# Patient Record
Sex: Female | Born: 1955
Health system: Southern US, Community
[De-identification: ages and names within clinical notes are randomized; demographics above are authoritative.]

## PROBLEM LIST (undated history)

## (undated) DIAGNOSIS — E7800 Pure hypercholesterolemia, unspecified: Secondary | ICD-10-CM

## (undated) DIAGNOSIS — R7303 Prediabetes: Secondary | ICD-10-CM

## (undated) DIAGNOSIS — N200 Calculus of kidney: Secondary | ICD-10-CM

## (undated) DIAGNOSIS — I10 Essential (primary) hypertension: Secondary | ICD-10-CM

## (undated) DIAGNOSIS — M1990 Unspecified osteoarthritis, unspecified site: Secondary | ICD-10-CM

## (undated) DIAGNOSIS — E785 Hyperlipidemia, unspecified: Secondary | ICD-10-CM

## (undated) DIAGNOSIS — K219 Gastro-esophageal reflux disease without esophagitis: Secondary | ICD-10-CM

## (undated) DIAGNOSIS — K5900 Constipation, unspecified: Secondary | ICD-10-CM

## (undated) DIAGNOSIS — M2550 Pain in unspecified joint: Secondary | ICD-10-CM

## (undated) DIAGNOSIS — M069 Rheumatoid arthritis, unspecified: Secondary | ICD-10-CM

## (undated) DIAGNOSIS — E559 Vitamin D deficiency, unspecified: Secondary | ICD-10-CM

## (undated) DIAGNOSIS — R1310 Dysphagia, unspecified: Secondary | ICD-10-CM

## (undated) DIAGNOSIS — L509 Urticaria, unspecified: Secondary | ICD-10-CM

## (undated) HISTORY — DX: Rheumatoid arthritis, unspecified: M06.9

## (undated) HISTORY — DX: Pain in unspecified joint: M25.50

## (undated) HISTORY — DX: Pure hypercholesterolemia, unspecified: E78.00

## (undated) HISTORY — DX: Hyperlipidemia, unspecified: E78.5

## (undated) HISTORY — DX: Unspecified osteoarthritis, unspecified site: M19.90

## (undated) HISTORY — DX: Dysphagia, unspecified: R13.10

## (undated) HISTORY — DX: Constipation, unspecified: K59.00

## (undated) HISTORY — PX: CHOLECYSTECTOMY: SHX55

## (undated) HISTORY — DX: Prediabetes: R73.03

## (undated) HISTORY — PX: ABDOMINAL HYSTERECTOMY: SHX81

## (undated) HISTORY — DX: Calculus of kidney: N20.0

## (undated) HISTORY — PX: LAPAROSCOPIC GASTRIC BANDING: SHX1100

## (undated) HISTORY — DX: Essential (primary) hypertension: I10

## (undated) HISTORY — DX: Urticaria, unspecified: L50.9

## (undated) HISTORY — PX: LAPAROSCOPIC REPAIR AND REMOVAL OF GASTRIC BAND: SHX5919

## (undated) HISTORY — DX: Vitamin D deficiency, unspecified: E55.9

---

## 2011-07-31 DIAGNOSIS — R1310 Dysphagia, unspecified: Secondary | ICD-10-CM

## 2011-07-31 HISTORY — DX: Dysphagia, unspecified: R13.10

## 2015-04-27 ENCOUNTER — Encounter (HOSPITAL_COMMUNITY): Payer: Self-pay | Admitting: Vascular Surgery

## 2015-04-27 ENCOUNTER — Emergency Department (HOSPITAL_COMMUNITY): Payer: Self-pay

## 2015-04-27 ENCOUNTER — Emergency Department (HOSPITAL_COMMUNITY)
Admission: EM | Admit: 2015-04-27 | Discharge: 2015-04-27 | Disposition: A | Discharge status: Home / Self Care | Payer: Self-pay | Attending: Emergency Medicine | Admitting: Emergency Medicine

## 2015-04-27 DIAGNOSIS — Z87891 Personal history of nicotine dependence: Secondary | ICD-10-CM | POA: Insufficient documentation

## 2015-04-27 DIAGNOSIS — R0789 Other chest pain: Secondary | ICD-10-CM

## 2015-04-27 DIAGNOSIS — I10 Essential (primary) hypertension: Secondary | ICD-10-CM | POA: Insufficient documentation

## 2015-04-27 DIAGNOSIS — E663 Overweight: Secondary | ICD-10-CM | POA: Insufficient documentation

## 2015-04-27 DIAGNOSIS — IMO0001 Reserved for inherently not codable concepts without codable children: Secondary | ICD-10-CM

## 2015-04-27 DIAGNOSIS — Z7982 Long term (current) use of aspirin: Secondary | ICD-10-CM | POA: Insufficient documentation

## 2015-04-27 DIAGNOSIS — R0602 Shortness of breath: Secondary | ICD-10-CM | POA: Insufficient documentation

## 2015-04-27 DIAGNOSIS — R2243 Localized swelling, mass and lump, lower limb, bilateral: Secondary | ICD-10-CM | POA: Insufficient documentation

## 2015-04-27 DIAGNOSIS — K219 Gastro-esophageal reflux disease without esophagitis: Secondary | ICD-10-CM | POA: Insufficient documentation

## 2015-04-27 DIAGNOSIS — F419 Anxiety disorder, unspecified: Secondary | ICD-10-CM | POA: Insufficient documentation

## 2015-04-27 HISTORY — DX: Essential (primary) hypertension: I10

## 2015-04-27 HISTORY — DX: Gastro-esophageal reflux disease without esophagitis: K21.9

## 2015-04-27 LAB — HEPATIC FUNCTION PANEL
ALT: 12 U/L — ABNORMAL LOW (ref 14–54)
AST: 19 U/L (ref 15–41)
Albumin: 4 g/dL (ref 3.5–5.0)
Alkaline Phosphatase: 113 U/L (ref 38–126)
Bilirubin, Direct: <0.1 mg/dL — ABNORMAL LOW (ref 0.1–0.5)
Total Bilirubin: 0.7 mg/dL (ref 0.3–1.2)
Total Protein: 7.4 g/dL (ref 6.5–8.1)

## 2015-04-27 LAB — URINE MICROSCOPIC-ADD ON

## 2015-04-27 LAB — URINALYSIS, ROUTINE W REFLEX MICROSCOPIC
Bilirubin Urine: NEGATIVE
Glucose, UA: NEGATIVE mg/dL
Ketones, ur: NEGATIVE mg/dL
Leukocytes, UA: NEGATIVE
Nitrite: NEGATIVE
Protein, ur: NEGATIVE mg/dL
Specific Gravity, Urine: 1.023 (ref 1.005–1.030)
Urobilinogen, UA: 1 mg/dL (ref 0.0–1.0)
pH: 6 (ref 5.0–8.0)

## 2015-04-27 LAB — CBC
HCT: 38.8 % (ref 36.0–46.0)
Hemoglobin: 12.5 g/dL (ref 12.0–15.0)
MCH: 23.5 pg — ABNORMAL LOW (ref 26.0–34.0)
MCHC: 32.2 g/dL (ref 30.0–36.0)
MCV: 72.9 fL — ABNORMAL LOW (ref 78.0–100.0)
Platelets: 305 10*3/uL (ref 150–400)
RBC: 5.32 MIL/uL — ABNORMAL HIGH (ref 3.87–5.11)
RDW: 15.3 % (ref 11.5–15.5)
WBC: 6.6 10*3/uL (ref 4.0–10.5)

## 2015-04-27 LAB — BRAIN NATRIURETIC PEPTIDE: B Natriuretic Peptide: 21.7 pg/mL (ref 0.0–100.0)

## 2015-04-27 LAB — BASIC METABOLIC PANEL
Anion gap: 9 (ref 5–15)
BUN: 12 mg/dL (ref 6–20)
CO2: 27 mmol/L (ref 22–32)
Calcium: 9.1 mg/dL (ref 8.9–10.3)
Chloride: 106 mmol/L (ref 101–111)
Creatinine, Ser: 0.99 mg/dL (ref 0.44–1.00)
GFR calc Af Amer: >60 mL/min (ref >60)
GFR calc non Af Amer: >60 mL/min (ref >60)
Glucose, Bld: 149 mg/dL — ABNORMAL HIGH (ref 65–99)
Potassium: 3.2 mmol/L — ABNORMAL LOW (ref 3.5–5.1)
Sodium: 142 mmol/L (ref 135–145)

## 2015-04-27 LAB — I-STAT TROPONIN, ED
Troponin i, poc: 0.01 ng/mL (ref 0.00–0.08)
Troponin i, poc: 0 ng/mL (ref 0.00–0.08)

## 2015-04-27 LAB — LIPASE, BLOOD: Lipase: 20 U/L — ABNORMAL LOW (ref 22–51)

## 2015-04-27 MED ORDER — FAMOTIDINE 20 MG PO TABS: 20.0000 mg | ORAL_TABLET | Freq: Two times a day (BID) | ORAL | Status: DC | PRN

## 2015-04-27 MED ORDER — FUROSEMIDE 10 MG/ML IJ SOLN
40.0000 mg | Freq: Once | INTRAMUSCULAR | Status: AC
  Administered 2015-04-27: 40 mg via INTRAVENOUS
  Filled 2015-04-27: qty 4

## 2015-04-27 MED ORDER — FAMOTIDINE 20 MG PO TABS
20.0000 mg | ORAL_TABLET | Freq: Once | ORAL | Status: AC
  Administered 2015-04-27: 20 mg via ORAL
  Filled 2015-04-27: qty 1

## 2015-04-27 MED ORDER — ONDANSETRON HCL 4 MG/2ML IJ SOLN
4.0000 mg | Freq: Once | INTRAMUSCULAR | Status: AC
  Administered 2015-04-27: 4 mg via INTRAVENOUS
  Filled 2015-04-27: qty 2

## 2015-04-27 MED ORDER — POTASSIUM CHLORIDE CRYS ER 20 MEQ PO TBCR
40.0000 meq | EXTENDED_RELEASE_TABLET | Freq: Once | ORAL | Status: AC
  Administered 2015-04-27: 40 meq via ORAL
  Filled 2015-04-27: qty 2

## 2015-04-27 MED ORDER — ESOMEPRAZOLE MAGNESIUM 40 MG PO CPDR: 40.0000 mg | DELAYED_RELEASE_CAPSULE | Freq: Every day | ORAL | Status: DC

## 2015-04-27 MED ORDER — HYDROCHLOROTHIAZIDE 12.5 MG PO CAPS
12.5000 mg | ORAL_CAPSULE | Freq: Every day | ORAL | Status: DC
  Administered 2015-04-27: 12.5 mg via ORAL
  Filled 2015-04-27: qty 1

## 2015-04-27 MED ORDER — HYDROCHLOROTHIAZIDE 12.5 MG PO TABS: 12.5000 mg | ORAL_TABLET | Freq: Every day | ORAL | Status: DC

## 2015-04-27 MED ORDER — GI COCKTAIL ~~LOC~~
30.0000 mL | Freq: Once | ORAL | Status: AC
  Administered 2015-04-27: 30 mL via ORAL
  Filled 2015-04-27: qty 30

## 2015-04-27 NOTE — ED Provider Notes (Signed)
CSN: [id number]     Arrival date & time 04/27/15  1405 History   First MD Initiated Contact with Patient 04/27/15 1631     Chief Complaint  Patient presents with  . Chest Pain     (Consider location/radiation/quality/duration/timing/severity/associated sxs/prior Treatment) The history is provided by the patient.  Jennifer Davila is a 59 y.o. female hx of HTN, GERD, lap band with subsequent removal, who presented with chest pain. Chest pain associated with some nausea since yesterday. Worse when she eats some food but denies any food stuck in her throat. Pain does not radiate. Has some nausea but no vomiting. Also feels lightheaded and dizzy. Denies any pleuritic pain. Has minimal shortness of breath laying down. Of note, patient has been having bilateral leg swelling for several months but has not seen her doctor about it. She also has history of reflux but has not been taking her medicines. She has history of hypertension but was on Norvasc previously but has not been taking it either.    Past Medical History  Diagnosis Date  . Hypertension   . GERD (gastroesophageal reflux disease)    Past Surgical History  Procedure Laterality Date  . Laparoscopic gastric banding    . Laparoscopic repair and removal of gastric band    . Abdominal hysterectomy    . Cholecystectomy     No family history on file. Social History  Substance Use Topics  . Smoking status: Former Smoker    Types: Cigarettes  . Smokeless tobacco: Never Used  . Alcohol Use: No   OB History    No data available     Review of Systems  Cardiovascular: Positive for chest pain and leg swelling.  All other systems reviewed and are negative.     Allergies  Review of patient's allergies indicates no known allergies.  Home Medications   Prior to Admission medications   Medication Sig Start Date End Date Taking? Authorizing Provider  aspirin 81 MG tablet Take 81 mg by mouth daily.   Yes Historical Provider, MD   ibuprofen (ADVIL,MOTRIN) 800 MG tablet Take 800 mg by mouth every 8 (eight) hours as needed for moderate pain.   Yes Historical Provider, MD   BP 141/72 mmHg  Pulse 69  Temp(Src) 98.7 F (37.1 C) (Oral)  Resp 16  Ht 5\' 2"  (1.575 m)  Wt 245 lb (111.131 kg)  BMI 44.80 kg/m2  SpO2 99% Physical Exam  Constitutional: She is oriented to person, place, and time.  Overweight, slightly anxious   HENT:  Head: Normocephalic.  Mouth/Throat: Oropharynx is clear and moist.  Eyes: Conjunctivae are normal. Pupils are equal, round, and reactive to light.  Neck: Normal range of motion. Neck supple.  Cardiovascular: Normal rate, regular rhythm and normal heart sounds.   Pulmonary/Chest: Effort normal.  ? Crackles bilateral bases   Abdominal: Soft. Bowel sounds are normal. She exhibits no distension. There is no tenderness. There is no rebound.  Musculoskeletal:  2+ edema bilaterally, no calf tenderness   Neurological: She is alert and oriented to person, place, and time. No cranial nerve deficit. Coordination normal.  Skin: Skin is warm and dry.  Psychiatric: She has a normal mood and affect. Her behavior is normal. Judgment and thought content normal.  Nursing note and vitals reviewed.   ED Course  Procedures (including critical care time) Labs Review Labs Reviewed  BASIC METABOLIC PANEL - Abnormal; Notable for the following:    Potassium 3.2 (*)    Glucose, Bld 149 (*)  All other components within normal limits  CBC - Abnormal; Notable for the following:    RBC 5.32 (*)    MCV 72.9 (*)    MCH 23.5 (*)    All other components within normal limits  HEPATIC FUNCTION PANEL - Abnormal; Notable for the following:    ALT 12 (*)    Bilirubin, Direct <0.1 (*)    All other components within normal limits  URINALYSIS, ROUTINE W REFLEX MICROSCOPIC (NOT AT [facility]) - Abnormal; Notable for the following:    APPearance CLOUDY (*)    Hgb urine dipstick TRACE (*)    All other components within  normal limits  LIPASE, BLOOD - Abnormal; Notable for the following:    Lipase 20 (*)    All other components within normal limits  URINE MICROSCOPIC-ADD ON - Abnormal; Notable for the following:    Squamous Epithelial / LPF MANY (*)    Crystals CA OXALATE CRYSTALS (*)    All other components within normal limits  BRAIN NATRIURETIC PEPTIDE  I-STAT TROPOININ, ED  Jennifer Davila, ED    Imaging Review Dg Chest 2 View  04/27/2015   CLINICAL DATA:  Chest pain beginning last night. Intermittent dizziness for a month.  EXAM: CHEST  2 VIEW  COMPARISON:  None.  FINDINGS: The lungs are clear. Heart size is normal. No pneumothorax or pleural effusion. Mild scoliosis noted. Cholecystectomy clips are seen.  IMPRESSION: No acute disease.   Electronically Signed   By: Inge Rise M.D.   On: 04/27/2015 16:09   I have personally reviewed and evaluated these images and lab results as part of my medical decision-making.   EKG Interpretation   Date/Time:  Wednesday April 27 2015 14:15:55 EDT Ventricular Rate:  94 PR Interval:  160 QRS Duration: 78 QT Interval:  366 QTC Calculation: 457 R Axis:   -16 Text Interpretation:  Normal sinus rhythm Normal ECG No previous ECGs  available Confirmed by Clorene Nerio  MD, Jennifer Davila ([id number]) on 04/27/2015 4:31:27 PM      MDM   Final diagnoses:  None    Jennifer Davila is a 59 y.o. female here with substernal chest pain. Likely reflux vs GERD, less likely ACS. Given onset less than 24 hrs, will get delta trop. Also consider CHF given leg swelling so will get BNP. I doubt dissection or PE. Will get labs, BNP, CXR, delta trop. Will reassess.   8:28 PM Delta trop neg. CXR clear. BNP nl. I think likely combination of reflux, hypertension. Will give HCTZ as it will help with hypertension, leg swelling. Will place patient back on nexium, prn pepcid. Pain resolved now.    Wandra Arthurs, MD 04/27/15 2030

## 2015-04-27 NOTE — Discharge Instructions (Signed)
Take hctz daily for elevated blood pressure and leg swelling.  Take nexium daily.  Take pepcid twice daily as needed for reflux.   See your GI doctor and regular doctor.   Recheck blood pressure in a week with your doctor.   Return to ER if you have worse chest pain, leg swelling, shortness of breath.

## 2015-04-27 NOTE — ED Notes (Signed)
Pt reports to the ED for eval of sharp midsternal CP that started last pm. Reports associated symptoms of lightheadedness and nausea. Denies any active vomiting or SOB.\ She reports it is worse with swallowing. She also reports bilateral leg pain and swelling x several months. Describes the leg pain as electrical feelings. Pt A&Ox4, resp e/u, and skin warm and dry.

## 2015-05-23 ENCOUNTER — Encounter: Payer: Self-pay | Admitting: Family Medicine

## 2015-05-23 ENCOUNTER — Ambulatory Visit (INDEPENDENT_AMBULATORY_CARE_PROVIDER_SITE_OTHER): Payer: Self-pay | Admitting: Family Medicine

## 2015-05-23 VITALS — BP 149/77 | HR 89 | Temp 97.9°F | Resp 16 | Ht 62.0 in | Wt 241.0 lb

## 2015-05-23 DIAGNOSIS — Z23 Encounter for immunization: Secondary | ICD-10-CM

## 2015-05-23 DIAGNOSIS — Z0000 Encounter for general adult medical examination without abnormal findings: Secondary | ICD-10-CM

## 2015-05-23 DIAGNOSIS — M25552 Pain in left hip: Secondary | ICD-10-CM

## 2015-05-23 MED ORDER — FAMOTIDINE 20 MG PO TABS: 20.0000 mg | ORAL_TABLET | Freq: Two times a day (BID) | ORAL | Status: DC | PRN

## 2015-05-23 MED ORDER — NAPROXEN 500 MG PO TABS: 500.0000 mg | ORAL_TABLET | Freq: Two times a day (BID) | ORAL | Status: DC

## 2015-05-23 MED ORDER — HYDROCHLOROTHIAZIDE 12.5 MG PO TABS: 12.5000 mg | ORAL_TABLET | Freq: Every day | ORAL | Status: DC

## 2015-05-23 MED ORDER — ESOMEPRAZOLE MAGNESIUM 40 MG PO CPDR: 40.0000 mg | DELAYED_RELEASE_CAPSULE | Freq: Every day | ORAL | Status: DC

## 2015-05-23 NOTE — Patient Instructions (Signed)
Call [phone] and ask for [facility] Financial Service and make an appointment to discuss your hospital bill and hopefully get a [facility] Discount Card I am sending your medicines to [facility] at [address] Discuss financial assistance for your medicines with them. Be sure to eat with your naproxen for your leg pain and be sure to take your medications for GERD regularly When you get the [facility] Discount Card or [employer], call and make an appointment to come back for a PAP smear (important). We will also need to schedule you a mammogram and give you stool cards at that time.

## 2015-05-25 DIAGNOSIS — M25559 Pain in unspecified hip: Secondary | ICD-10-CM | POA: Insufficient documentation

## 2015-05-25 NOTE — Progress Notes (Signed)
Patient ID: Jennifer Davila, female   DOB: 11/26/1955, 59 y.o.   MRN: [id number]   Jennifer Davila, is a 59 y.o. female  [id number]  [id number]  DOB - [birth year]  CC:  Chief Complaint  Patient presents with  . Establish Care  . Leg Pain    SWELLING        HPI: Jennifer Davila is a 59 y.o. female here to establish care. She was seen at urgent care on 9/28 with chest pain which was diagnosed as non-cardiac. At that time she was found to be hypertensive and started on Hctz 25 mg. For HTN and for Nexium and famodine for GERD. Her only other medication in ibuprofen for left hip and leg pain. This is her major complaint at this time.  She was told to follow-up her for ongoing treatment of hypertension and GERD. Her BP today is 149/77. She reports very little heartburn symptoms since starting on medication. She did have labs on 9/28 and those have been reviewed. She is in need of some health maintenance updates, including Tdap, influenza, pneumonia, colon cancer screening, mammogram and PAP. She reports she does not smoking cigarettes, use alcohol or use illicit drugs.   No Known Allergies Past Medical History  Diagnosis Date  . Hypertension   . GERD (gastroesophageal reflux disease)    Current Outpatient Prescriptions on File Prior to Visit  Medication Sig Dispense Refill  . aspirin 81 MG tablet Take 81 mg by mouth daily.    Marland Kitchen ibuprofen (ADVIL,MOTRIN) 800 MG tablet Take 800 mg by mouth every 8 (eight) hours as needed for moderate pain.     No current facility-administered medications on file prior to visit.   Family History  Problem Relation Age of Onset  . Diabetes Father    Social History   Social History  . Marital Status: Married    Spouse Name: N/A  . Number of Children: N/A  . Years of Education: N/A   Occupational History  . Not on file.   Social History Main Topics  . Smoking status: Former Smoker    Types: Cigarettes  . Smokeless tobacco: Never Used  .  Alcohol Use: No  . Drug Use: No  . Sexual Activity: Not on file   Other Topics Concern  . Not on file   Social History Narrative    Review of Systems: Constitutional: Negative for fever, chills, appetite change, weight loss,  Fatigue. Skin: Negative for rashes or lesions of concern. HENT: Negative for ear discharge.nose bleeds. She reports occassional pressure in ears. Eyes: Negative for pain, discharge, redness, itching. Wears glasses for vision Neck: Negative for pain, stiffness Respiratory: Negative for cough, shortness of breath,   Cardiovascular: Negative for chest pain, palpitations and leg swelling. Gastrointestinal: Negative for abdominal pain, nausea, vomiting, diarrhea, constipations. Positive for heartburn, currently controlled.  Genitourinary: Negative for dysuria, urgency, frequency, hematuria,  Musculoskeletal: Positive  For low back pain and left hip and leg pain Negative joint  swelling, and gait problem.Negative for weakness. Neurological: Negative fortremors, seizures, syncope, numbness. Positive for occassional  Headaches and occassional dizziness.   Hematological: Negative for easy bruising or bleeding Psychiatric/Behavioral: Negative for depression, anxiety, decreased concentration, confusion   Objective:   Filed Vitals:   05/23/15 1407  BP: 149/77  Pulse: 89  Temp: 97.9 F (36.6 C)  Resp: 16    Physical Exam: Constitutional: Patient appears well-developed and well-nourished. No distress. HENT: Normocephalic, atraumatic, External right and left ear normal. Oropharynx is clear and moist.  Eyes: Conjunctivae and EOM are normal. PERRLA, no scleral icterus. Neck: Normal ROM. Neck supple. No lymphadenopathy, No thyromegaly. CVS: RRR, S1/S2 +, no murmurs, no gallops, no rubs Pulmonary: Effort and breath sounds normal, no stridor, rhonchi, wheezes, rales.  Abdominal: Soft. Normoactive BS,, no distension, tenderness, rebound or guarding.  Musculoskeletal:  Normal range of motion. No edema. There is mild tenderness with pressure over the left hip joint. Neuro: Alert.Normal muscle tone coordination. Non-focal Skin: Skin is warm and dry. No rash noted. Not diaphoretic. No erythema. No pallor. Psychiatric: Normal mood and affect. Behavior, judgment, thought content normal.  Lab Results  Component Value Date   WBC 6.6 04/27/2015   HGB 12.5 04/27/2015   HCT 38.8 04/27/2015   MCV 72.9* 04/27/2015   PLT 305 04/27/2015   Lab Results  Component Value Date   CREATININE 0.99 04/27/2015   BUN 12 04/27/2015   NA 142 04/27/2015   K 3.2* 04/27/2015   CL 106 04/27/2015   CO2 27 04/27/2015    No results found for: HGBA1C Lipid Panel  No results found for: CHOL, TRIG, HDL, CHOLHDL, VLDL, LDLCALC     Assessment and plan:   1. Health care maintenance  - Flu Vaccine QUAD 36+ mos PF IM (Fluarix & Fluzone Quad PF) -Tdap greater than 7.  2. Visit to establish care -I have reviewed information provided by the patient and labs drawn recently. She currently has no coverage and request we wait until she arranges for [facility] Discount Card and /or orange card to do any further preventive care measures. I have suggested she contact [facility] Financial services with her recent hospital bill.She will follow-up when that is done to make arrangements for colon cancer screen, mammogram, PAP smear and other screening labs needed.    Return in about 2 months (around 07/23/2015).  The patient was given clear instructions to go to ER or return to medical center if symptoms don't improve, worsen or new problems develop. The patient verbalized understanding.    Micheline Chapman FNP  05/25/2015, 12:02 PM

## 2015-08-04 ENCOUNTER — Ambulatory Visit (INDEPENDENT_AMBULATORY_CARE_PROVIDER_SITE_OTHER): Payer: Self-pay | Admitting: Family Medicine

## 2015-08-04 ENCOUNTER — Encounter: Payer: Self-pay | Admitting: Family Medicine

## 2015-08-04 VITALS — BP 187/98 | HR 77 | Temp 98.7°F | Resp 16

## 2015-08-04 DIAGNOSIS — Z23 Encounter for immunization: Secondary | ICD-10-CM

## 2015-08-04 DIAGNOSIS — I10 Essential (primary) hypertension: Secondary | ICD-10-CM

## 2015-08-04 LAB — CBC WITH DIFFERENTIAL/PLATELET
Basophils Absolute: 0.1 10*3/uL (ref 0.0–0.1)
Basophils Relative: 1 % (ref 0–1)
Eosinophils Absolute: 0.3 10*3/uL (ref 0.0–0.7)
Eosinophils Relative: 4 % (ref 0–5)
HCT: 38.4 % (ref 36.0–46.0)
Hemoglobin: 12.3 g/dL (ref 12.0–15.0)
Lymphocytes Relative: 32 % (ref 12–46)
Lymphs Abs: 2 10*3/uL (ref 0.7–4.0)
MCH: 22.9 pg — ABNORMAL LOW (ref 26.0–34.0)
MCHC: 32 g/dL (ref 30.0–36.0)
MCV: 71.6 fL — ABNORMAL LOW (ref 78.0–100.0)
MPV: 10 fL (ref 8.6–12.4)
Monocytes Absolute: 0.3 10*3/uL (ref 0.1–1.0)
Monocytes Relative: 5 % (ref 3–12)
Neutro Abs: 3.7 10*3/uL (ref 1.7–7.7)
Neutrophils Relative %: 58 % (ref 43–77)
Platelets: 304 10*3/uL (ref 150–400)
RBC: 5.36 MIL/uL — ABNORMAL HIGH (ref 3.87–5.11)
RDW: 16 % — ABNORMAL HIGH (ref 11.5–15.5)
WBC: 6.3 10*3/uL (ref 4.0–10.5)

## 2015-08-04 LAB — TSH: TSH: 5.709 u[IU]/mL — ABNORMAL HIGH (ref 0.350–4.500)

## 2015-08-04 MED ORDER — LISINOPRIL-HYDROCHLOROTHIAZIDE 20-25 MG PO TABS: 1.0000 | ORAL_TABLET | Freq: Every day | ORAL | Status: DC

## 2015-08-04 MED FILL — LISINOPRIL-HCTZ 20-25 MG TA: 20-25 | 30 days supply | Qty: 30 | Fill #0

## 2015-08-04 NOTE — Patient Instructions (Addendum)
Make an appointment for 2 weeks to have BP checked and to have a PAP smear Stop hydrochol\thizide and start lisinopril/hctz 20/25. Daily Careful of salt in diet.

## 2015-08-04 NOTE — Progress Notes (Signed)
Patient ID: Jennifer Davila, female   DOB: [birth year], 60 y.o.   MRN: [id number]   Jennifer Davila, is a 60 y.o. female  [id number]  [id number]  DOB - 08/03/1955  CC:  Chief Complaint  Patient presents with  . Follow-up    2 month       HPI: Jennifer Davila is a 60 y.o. female here for follow-up hypertension. And GERD. She is currently on Hydrodiuril 12.5 mg for hypertension and Nexium 40 mg for GERD.She reports not being consistent with recommended diet and exercise.She also complains of chronic lower back and knee pain and takes naproxen prn.   No Known Allergies Past Medical History  Diagnosis Date  . Hypertension   . GERD (gastroesophageal reflux disease)    Current Outpatient Prescriptions on File Prior to Visit  Medication Sig Dispense Refill  . aspirin 81 MG tablet Take 81 mg by mouth daily.    Marland Kitchen esomeprazole (NEXIUM) 40 MG capsule Take 1 capsule (40 mg total) by mouth daily. 30 capsule 0  . ibuprofen (ADVIL,MOTRIN) 800 MG tablet Take 800 mg by mouth every 8 (eight) hours as needed for moderate pain.    . naproxen (NAPROSYN) 500 MG tablet Take 1 tablet (500 mg total) by mouth 2 (two) times daily with a meal. 30 tablet 0  . famotidine (PEPCID) 20 MG tablet Take 1 tablet (20 mg total) by mouth 2 (two) times daily as needed for heartburn or indigestion. (Patient not taking: Reported on 08/04/2015) 90 tablet 3   No current facility-administered medications on file prior to visit.   Family History  Problem Relation Age of Onset  . Diabetes Father    Social History   Social History  . Marital Status: Married    Spouse Name: N/A  . Number of Children: N/A  . Years of Education: N/A   Occupational History  . Not on file.   Social History Main Topics  . Smoking status: Former Smoker    Types: Cigarettes  . Smokeless tobacco: Never Used  . Alcohol Use: No  . Drug Use: No  . Sexual Activity: Not on file   Other Topics Concern  . Not on file   Social  History Narrative    Review of Systems: Constitutional: Negative for fever, chills, appetite change, weight loss,  Fatigue. Skin: Negative for rashes or lesions of concern. Reports dry scalp HENT: Negative for ear pain, ear discharge.nose bleeds Eyes: Negative for pain, discharge, redness, itching and visual disturbance. Neck: Negative for pain, stiffness Respiratory: Negative for cough, shortness of breath,   Cardiovascular: Negative for chest pain, palpitations. Some pedal edema with standing for prolonged periods.  Gastrointestinal: Negative for abdominal pain, nausea, vomiting, diarrhea, constipations Genitourinary: Negative for dysuria, urgency, frequency, hematuria,  Musculoskeletal: Positive for back pain and knew pain.joint  swelling, and gait problem.Negative for weakness. Neurological: Negative for  tremors, seizures, syncope,   light-headedness, numbness. Positive for occassional dizziness and headaches.  Hematological: Negative for easy bruising or bleeding Psychiatric/Behavioral: Negative for depression, anxiety, decreased concentration, confusion   Objective:   Filed Vitals:   08/04/15 1133 08/04/15 1159  BP: 176/119 187/98  Pulse: 77   Temp: 98.7 F (37.1 C)   Resp: 16     Physical Exam: Constitutional: Patient appears well-developed and well-nourished. No distress. HENT: Normocephalic, atraumatic, External right and left ear normal. Oropharynx is clear and moist.  Eyes: Conjunctivae and EOM are normal. PERRLA, no scleral icterus. Neck: Normal ROM. Neck supple. No lymphadenopathy, No thyromegaly. CVS:  RRR, S1/S2 +, no murmurs, no gallops, no rubs. There is mild pedal edema. Pulmonary: Effort and breath sounds normal, no stridor, rhonchi, wheezes, rales.  Abdominal: Soft. Normoactive BS,, no distension, tenderness, rebound or guarding.  Musculoskeletal: Normal range of motion. No edema and no tenderness.  Neuro: Alert.Normal muscle tone coordination.  Non-focal Skin: Skin is warm and dry. No rash noted. Not diaphoretic. No erythema. No pallor. Psychiatric: Normal mood and affect. Behavior, judgment, thought content normal.  Lab Results  Component Value Date   WBC 6.6 04/27/2015   HGB 12.5 04/27/2015   HCT 38.8 04/27/2015   MCV 72.9* 04/27/2015   PLT 305 04/27/2015   Lab Results  Component Value Date   CREATININE 0.99 04/27/2015   BUN 12 04/27/2015   NA 142 04/27/2015   K 3.2* 04/27/2015   CL 106 04/27/2015   CO2 27 04/27/2015    No results found for: HGBA1C Lipid Panel  No results found for: CHOL, TRIG, HDL, CHOLHDL, VLDL, LDLCALC     Assessment and plan:   1. Essential hypertension - COMPLETE METABOLIC PANEL WITH GFR - CBC w/Diff - Lipid panel - TSH - discontinue hydrodiuril.   Start  lisinopril-hydrochlorothiazide (PRINZIDE,ZESTORETIC) 20-25 MG tablet; Take 1 tablet by mouth daily.  Dispense: 90 tablet; Refill: 3  2. Need for prophylactic vaccination against Streptococcus pneumoniae (pneumococcus) - Pneumococcal conjugate vaccine 13-valent  Return in about 1 week (around 08/11/2015).  The patient was given clear instructions to go to ER or return to medical center if symptoms don't improve, worsen or new problems develop. The patient verbalized understanding.    Micheline Chapman FNP  08/04/2015, 3:46 PM

## 2015-08-05 LAB — COMPLETE METABOLIC PANEL WITH GFR
ALT: 9 U/L (ref 6–29)
AST: 14 U/L (ref 10–35)
Albumin: 4.3 g/dL (ref 3.6–5.1)
Alkaline Phosphatase: 99 U/L (ref 33–130)
BUN: 15 mg/dL (ref 7–25)
CO2: 28 mmol/L (ref 20–31)
Calcium: 9.4 mg/dL (ref 8.6–10.4)
Chloride: 98 mmol/L (ref 98–110)
Creat: 0.88 mg/dL (ref 0.50–0.99)
GFR, Est African American: 82 mL/min (ref >=60)
GFR, Est Non African American: 71 mL/min (ref >=60)
Glucose, Bld: 92 mg/dL (ref 65–99)
Potassium: 3.1 mmol/L — ABNORMAL LOW (ref 3.5–5.3)
Sodium: 141 mmol/L (ref 135–146)
Total Bilirubin: 0.5 mg/dL (ref 0.2–1.2)
Total Protein: 7.4 g/dL (ref 6.1–8.1)

## 2015-08-05 LAB — LIPID PANEL
Cholesterol: 205 mg/dL — ABNORMAL HIGH (ref 125–200)
HDL: 44 mg/dL — ABNORMAL LOW (ref >=46)
LDL Cholesterol: 138 mg/dL — ABNORMAL HIGH (ref <130)
Total CHOL/HDL Ratio: 4.7 Ratio (ref <=5.0)
Triglycerides: 115 mg/dL (ref <150)
VLDL: 23 mg/dL (ref <30)

## 2015-08-09 ENCOUNTER — Telehealth: Payer: Self-pay

## 2015-08-09 ENCOUNTER — Other Ambulatory Visit (HOSPITAL_COMMUNITY): Payer: Self-pay | Admitting: Family Medicine

## 2015-08-09 MED ORDER — POTASSIUM CHLORIDE ER 10 MEQ PO TBCR: 10.0000 meq | EXTENDED_RELEASE_TABLET | Freq: Every day | ORAL | Status: DC

## 2015-08-09 MED ORDER — PRAVASTATIN SODIUM 20 MG PO TABS
20.0000 mg | ORAL_TABLET | Freq: Every day | ORAL | Status: DC
Start: 2015-08-09 — End: 2016-05-03

## 2015-08-09 MED FILL — POTASSIUM CL 10 MEQ TAB SA: 10 | 30 days supply | Qty: 30 | Fill #0

## 2015-08-09 MED FILL — PRAVASTATIN NA 20 MG TAB: 20 | 30 days supply | Qty: 30 | Fill #0

## 2015-08-09 NOTE — Telephone Encounter (Signed)
-----   Message from Micheline Chapman, NP sent at 08/09/2015  8:28 AM EST ----- Potassium low (3.1) Will send in rx for replacement. Cholesterol elevated, will send in rx.

## 2015-08-09 NOTE — Telephone Encounter (Signed)
Patient returned call and I advised her of results and that rx's had been sent into pharmacy. Thanks!

## 2015-08-09 NOTE — Telephone Encounter (Signed)
Left a message for patient to return call.

## 2015-08-11 ENCOUNTER — Other Ambulatory Visit: Payer: Self-pay

## 2015-08-29 ENCOUNTER — Ambulatory Visit (INDEPENDENT_AMBULATORY_CARE_PROVIDER_SITE_OTHER): Payer: Self-pay | Admitting: Family Medicine

## 2015-08-29 ENCOUNTER — Encounter: Payer: Self-pay | Admitting: Family Medicine

## 2015-08-29 VITALS — BP 184/88 | HR 77 | Temp 99.1°F | Ht 62.0 in | Wt 241.0 lb

## 2015-08-29 DIAGNOSIS — I10 Essential (primary) hypertension: Secondary | ICD-10-CM

## 2015-08-29 MED ORDER — AMLODIPINE BESYLATE 10 MG PO TABS: 10.0000 mg | ORAL_TABLET | Freq: Every day | ORAL | Status: DC

## 2015-08-29 MED ORDER — HYDROCHLOROTHIAZIDE 25 MG PO TABS: 25.0000 mg | ORAL_TABLET | Freq: Every day | ORAL | Status: DC

## 2015-08-29 MED FILL — ?AMLODIPINE BESYLATE 10 MG: 10 | 30 days supply | Qty: 30 | Fill #0

## 2015-08-29 MED FILL — HYDROCHLOROTHIAZIDE 25 MG T: 25 | 30 days supply | Qty: 30 | Fill #0

## 2015-08-29 NOTE — Patient Instructions (Signed)
Stop lisinopril/hctz Start amlodipine 10 mg daily & Hctz 25 mg daily.

## 2015-08-31 NOTE — Progress Notes (Signed)
Patient ID: Jennifer Davila, female   DOB: [birth year], 60 y.o.   MRN: [id number]   Jennifer Davila, is a 60 y.o. female  [id number]  [id number]  DOB - 1956/05/23  CC:  Chief Complaint  Patient presents with  . pap smear    would like to go over lab work as well, complains of cough since starting meds       HPI: Jennifer Davila is a 60 y.o. female who presents for follow-up hypertension. She reports she has had a dry cough since starting her lisinopril.Her blood pressure is not in adequate control. She reports no other complaints. She also has a history of GERD.She has had her flu shot and her first pneumonia shot.   No Known Allergies   Past Medical History  Diagnosis Date  . Hypertension   . GERD (gastroesophageal reflux disease)    Current Outpatient Prescriptions on File Prior to Visit  Medication Sig Dispense Refill  . esomeprazole (NEXIUM) 40 MG capsule Take 1 capsule (40 mg total) by mouth daily. 30 capsule 0  . ibuprofen (ADVIL,MOTRIN) 800 MG tablet Take 800 mg by mouth every 8 (eight) hours as needed for moderate pain.    . naproxen (NAPROSYN) 500 MG tablet Take 1 tablet (500 mg total) by mouth 2 (two) times daily with a meal. 30 tablet 0  . potassium chloride (K-DUR) 10 MEQ tablet Take 1 tablet (10 mEq total) by mouth daily. 90 tablet 1  . pravastatin (PRAVACHOL) 20 MG tablet Take 1 tablet (20 mg total) by mouth daily. 90 tablet 1  . aspirin 81 MG tablet Take 81 mg by mouth daily.    . famotidine (PEPCID) 20 MG tablet Take 1 tablet (20 mg total) by mouth 2 (two) times daily as needed for heartburn or indigestion. (Patient not taking: Reported on 08/04/2015) 90 tablet 3   No current facility-administered medications on file prior to visit.   Family History  Problem Relation Age of Onset  . Diabetes Father    Social History   Social History  . Marital Status: Married    Spouse Name: N/A  . Number of Children: N/A  . Years of Education: N/A   Occupational  History  . Not on file.   Social History Main Topics  . Smoking status: Former Smoker    Types: Cigarettes  . Smokeless tobacco: Never Used  . Alcohol Use: No  . Drug Use: No  . Sexual Activity: Not on file   Other Topics Concern  . Not on file   Social History Narrative    Review of Systems: Constitutional: Negative for fever, chills, appetite change, weight loss,  Fatigue. Skin: Negative for rashes or lesions of concern. HENT: Negative for ear pain, ear discharge.nose bleeds Eyes: Negative for pain, discharge, redness, itching and visual disturbance. Neck: Negative for pain, stiffness Respiratory: Negative for shortness of breath. Positive for dry cough since starting lisinopril Cardiovascular: Negative for chest pain, palpitations and leg swelling. Gastrointestinal: Negative for abdominal pain, nausea, vomiting, diarrhea, constipations Genitourinary: Negative for dysuria, urgency, frequency, hematuria,  Musculoskeletal: Negative for back pain, joint pain, joint  swelling, and gait problem.Negative for weakness. Neurological: Negative for dizziness, tremors, seizures, syncope,   light-headedness, numbness and headaches.  Hematological: Negative for easy bruising or bleeding Psychiatric/Behavioral: Negative for depression, anxiety, decreased concentration, confusion   Objective:   Filed Vitals:   08/29/15 1355 08/29/15 1414  BP: 189/82 184/88  Pulse: 77   Temp: 99.1 F (37.3 C)  Physical Exam: Constitutional: Patient appears well-developed and well-nourished. No distress. HENT: Normocephalic, atraumatic, External right and left ear normal. Oropharynx is clear and moist.  Eyes: Conjunctivae and EOM are normal. PERRLA, no scleral icterus. Neck: Normal ROM. Neck supple. No lymphadenopathy, No thyromegaly. CVS: RRR, S1/S2 +, no murmurs, no gallops, no rubs Pulmonary: Effort and breath sounds normal, no stridor, rhonchi, wheezes, rales.  Abdominal: Soft. Normoactive  BS,, no distension, tenderness, rebound or guarding.  Musculoskeletal: Normal range of motion. No edema and no tenderness.  Neuro: Alert.Normal muscle tone coordination. Non-focal Skin: Skin is warm and dry. No rash noted. Not diaphoretic. No erythema. No pallor. Psychiatric: Normal mood and affect. Behavior, judgment, thought content normal.  Lab Results  Component Value Date   WBC 6.3 08/04/2015   HGB 12.3 08/04/2015   HCT 38.4 08/04/2015   MCV 71.6* 08/04/2015   PLT 304 08/04/2015   Lab Results  Component Value Date   CREATININE 0.88 08/04/2015   BUN 15 08/04/2015   NA 141 08/04/2015   K 3.1* 08/04/2015   CL 98 08/04/2015   CO2 28 08/04/2015    No results found for: HGBA1C Lipid Panel     Component Value Date/Time   CHOL 205* 08/04/2015 1208   TRIG 115 08/04/2015 1208   HDL 44* 08/04/2015 1208   CHOLHDL 4.7 08/04/2015 1208   VLDL 23 08/04/2015 1208   LDLCALC 138* 08/04/2015 1208       Assessment and plan:   1. Essential hypertension - Stop lisinopril - amLODipine (NORVASC) 10 MG tablet; Take 1 tablet (10 mg total) by mouth daily.  Dispense: 90 tablet; Refill: 3 - hydrochlorothiazide (HYDRODIURIL) 25 MG tablet; Take 1 tablet (25 mg total) by mouth daily.  Dispense: 90 tablet; Refill: 3   Return in about 1 month (around 09/27/2015) for HTN.  The patient was given clear instructions to go to ER or return to medical center if symptoms don't improve, worsen or new problems develop. The patient verbalized understanding.    Jennifer Chapman FNP  08/31/2015, 4:39 PM

## 2015-09-29 ENCOUNTER — Other Ambulatory Visit: Payer: Self-pay | Admitting: Family Medicine

## 2015-09-29 ENCOUNTER — Other Ambulatory Visit: Payer: Self-pay

## 2015-09-29 MED ORDER — LISINOPRIL 10 MG PO TABS: 10.0000 mg | ORAL_TABLET | Freq: Every day | ORAL | Status: DC

## 2015-09-29 NOTE — Progress Notes (Signed)
Pt reports pain in lower back that radiates to feet-bilateral. Per Vaughan Basta, advised pt to make appt.  I also advised patient of low sodium diet, comfort measures for pain, and that lisinopril would be added (per Vaughan Basta) to current medication list-[facility] per [place]. Pt voiced understanding.

## 2015-10-31 ENCOUNTER — Other Ambulatory Visit: Payer: Self-pay | Admitting: Family Medicine

## 2015-10-31 ENCOUNTER — Encounter: Payer: Self-pay | Admitting: Family Medicine

## 2015-10-31 ENCOUNTER — Ambulatory Visit (INDEPENDENT_AMBULATORY_CARE_PROVIDER_SITE_OTHER): Payer: Self-pay | Admitting: Family Medicine

## 2015-10-31 VITALS — BP 142/78 | HR 79 | Temp 98.1°F | Ht 63.0 in | Wt 234.0 lb

## 2015-10-31 DIAGNOSIS — Z114 Encounter for screening for human immunodeficiency virus [HIV]: Secondary | ICD-10-CM

## 2015-10-31 DIAGNOSIS — I10 Essential (primary) hypertension: Secondary | ICD-10-CM

## 2015-10-31 DIAGNOSIS — Z1159 Encounter for screening for other viral diseases: Secondary | ICD-10-CM

## 2015-10-31 DIAGNOSIS — K219 Gastro-esophageal reflux disease without esophagitis: Secondary | ICD-10-CM

## 2015-10-31 LAB — COMPLETE METABOLIC PANEL WITH GFR
ALT: 15 U/L (ref 6–29)
AST: 22 U/L (ref 10–35)
Albumin: 4 g/dL (ref 3.6–5.1)
Alkaline Phosphatase: 76 U/L (ref 33–130)
BUN: 15 mg/dL (ref 7–25)
CO2: 31 mmol/L (ref 20–31)
Calcium: 8.7 mg/dL (ref 8.6–10.4)
Chloride: 100 mmol/L (ref 98–110)
Creat: 0.93 mg/dL (ref 0.50–0.99)
GFR, Est African American: 76 mL/min (ref >=60)
GFR, Est Non African American: 66 mL/min (ref >=60)
Glucose, Bld: 103 mg/dL — ABNORMAL HIGH (ref 65–99)
Potassium: 3.1 mmol/L — ABNORMAL LOW (ref 3.5–5.3)
Sodium: 142 mmol/L (ref 135–146)
Total Bilirubin: 0.6 mg/dL (ref 0.2–1.2)
Total Protein: 6.6 g/dL (ref 6.1–8.1)

## 2015-10-31 LAB — HIV ANTIBODY (ROUTINE TESTING W REFLEX): HIV 1&2 Ab, 4th Generation: NONREACTIVE

## 2015-10-31 LAB — HEPATITIS C ANTIBODY: HCV Ab: NEGATIVE

## 2015-10-31 MED ORDER — POTASSIUM CHLORIDE ER 10 MEQ PO TBCR: 10.0000 meq | EXTENDED_RELEASE_TABLET | Freq: Every day | ORAL | Status: DC

## 2015-10-31 MED ORDER — HYDROCHLOROTHIAZIDE 25 MG PO TABS: 25.0000 mg | ORAL_TABLET | Freq: Every day | ORAL | Status: DC

## 2015-10-31 MED ORDER — ESOMEPRAZOLE MAGNESIUM 40 MG PO CPDR: 40.0000 mg | DELAYED_RELEASE_CAPSULE | Freq: Every day | ORAL | Status: DC

## 2015-10-31 MED FILL — POTASSIUM CL 10 MEQ TAB SA: 10 | 30 days supply | Qty: 30 | Fill #0

## 2015-10-31 MED FILL — ?ESOMEPRAZOLE MAG DR 40 MG: 40 MG | 30 days supply | Qty: 30 | Fill #0

## 2015-10-31 MED FILL — HYDROCHLOROTHIAZIDE 25 MG T: 25 | 30 days supply | Qty: 30 | Fill #0

## 2015-10-31 NOTE — Progress Notes (Signed)
Patient ID: Jennifer Davila, female   DOB: [birth year], 60 y.o.   MRN: [id number]   Jennifer Davila, is a 60 y.o. female  [id number]  [id number]  DOB - 10/07/1955  CC:  Chief Complaint  Patient presents with  . Hypertension    here to discuss HTN meds, can not afford        HPI: Jennifer Davila is a 60 y.o. female here for follow-up of hypertension, hypokalemia and GERD. Her major problem is lack of funds to purchase medications. She is in process of applying for orange card and/or cone discount card. She never started the hydiuril prescribed at last visit. She has not had her amlodipine in several days. She is taking her prescribed potassium. She reports not following any particular dietary plan and does not exercise regularly.  She reports having upper respiratory symptoms last week that are improving.   Health Maintenance:  Is in need of mammogram and colon cancer screening. She also has not been screened for HIV and Hep C.   No Known Allergies Past Medical History  Diagnosis Date  . Hypertension   . GERD (gastroesophageal reflux disease)    Current Outpatient Prescriptions on File Prior to Visit  Medication Sig Dispense Refill  . aspirin 81 MG tablet Take 81 mg by mouth daily.    . pravastatin (PRAVACHOL) 20 MG tablet Take 1 tablet (20 mg total) by mouth daily. 90 tablet 1  . amLODipine (NORVASC) 10 MG tablet Take 1 tablet (10 mg total) by mouth daily. (Patient not taking: Reported on 10/31/2015) 90 tablet 3  . famotidine (PEPCID) 20 MG tablet Take 1 tablet (20 mg total) by mouth 2 (two) times daily as needed for heartburn or indigestion. (Patient not taking: Reported on 08/04/2015) 90 tablet 3  . ibuprofen (ADVIL,MOTRIN) 800 MG tablet Take 800 mg by mouth every 8 (eight) hours as needed for moderate pain. Reported on 10/31/2015    . lisinopril (PRINIVIL,ZESTRIL) 10 MG tablet Take 1 tablet (10 mg total) by mouth daily. (Patient not taking: Reported on 10/31/2015) 90 tablet 3  .  naproxen (NAPROSYN) 500 MG tablet Take 1 tablet (500 mg total) by mouth 2 (two) times daily with a meal. (Patient not taking: Reported on 10/31/2015) 30 tablet 0   No current facility-administered medications on file prior to visit.   Family History  Problem Relation Age of Onset  . Diabetes Father    Social History   Social History  . Marital Status: Married    Spouse Name: N/A  . Number of Children: N/A  . Years of Education: N/A   Occupational History  . Not on file.   Social History Main Topics  . Smoking status: Former Smoker    Types: Cigarettes  . Smokeless tobacco: Never Used  . Alcohol Use: No  . Drug Use: No  . Sexual Activity: Not on file   Other Topics Concern  . Not on file   Social History Narrative    Review of Systems: Constitutional: Negative for fever, chills, appetite change, weight loss,  Fatigue. Skin: Negative for rashes or lesions of concern. HENT: Negative for ear pain, ear discharge.nose bleeds. Resolving URI symptoms Eyes: Negative for pain, discharge, redness, itching and visual disturbance. Neck: Negative for pain, stiffness Respiratory: Negative for cough, shortness of breath,   Cardiovascular: Negative for chest pain, palpitations. Positive for swelling of feet Gastrointestinal: Negative for abdominal pain, nausea, vomiting, diarrhea, constipations. Positive for heartburn Genitourinary: Negative for dysuria, urgency, frequency, hematuria,  Musculoskeletal:  Negative for back pain, joint pain, joint  swelling, and gait problem.Negative for weakness.Positive for generalized leg pain Neurological: Negative for tremors, seizures, syncope,   light-headedness, numbness and headaches. Positive for occassional dizziness Hematological: Negative for easy bruising or bleeding Psychiatric/Behavioral: Negative for depression, anxiety, decreased concentration, confusion   Objective:   Filed Vitals:   10/31/15 0954  BP: 142/78  Pulse: 79  Temp: 98.1  F (36.7 C)    Physical Exam: Constitutional: Patient appears well-developed and well-nourished. No distress. HENT: Normocephalic, atraumatic, External right and left ear normal. Oropharynx is clear and moist. Nasal passages mildly inflammed. Eyes: Conjunctivae and EOM are normal. PERRLA, no scleral icterus. Neck: Normal ROM. Neck supple. No lymphadenopathy, No thyromegaly. CVS: RRR, S1/S2 +, no murmurs, no gallops, no rubs Pulmonary: Effort and breath sounds normal, no stridor, rhonchi, wheezes, rales.  Abdominal: Soft. Normoactive BS,, no distension, tenderness, rebound or guarding.  Musculoskeletal: Normal range of motion. No edema and no tenderness.  Neuro: Alert.Normal muscle tone coordination. Non-focal Skin: Skin is warm and dry. No rash noted. Not diaphoretic. No erythema. No pallor. Psychiatric: Normal mood and affect. Behavior, judgment, thought content normal.  Lab Results  Component Value Date   WBC 6.3 08/04/2015   HGB 12.3 08/04/2015   HCT 38.4 08/04/2015   MCV 71.6* 08/04/2015   PLT 304 08/04/2015   Lab Results  Component Value Date   CREATININE 0.88 08/04/2015   BUN 15 08/04/2015   NA 141 08/04/2015   K 3.1* 08/04/2015   CL 98 08/04/2015   CO2 28 08/04/2015    No results found for: HGBA1C Lipid Panel     Component Value Date/Time   CHOL 205* 08/04/2015 1208   TRIG 115 08/04/2015 1208   HDL 44* 08/04/2015 1208   CHOLHDL 4.7 08/04/2015 1208   VLDL 23 08/04/2015 1208   LDLCALC 138* 08/04/2015 1208       Assessment and plan:   1. Essential hypertension  - COMPLETE METABOLIC PANEL WITH GFR - potassium chloride (K-DUR) 10 MEQ tablet; Take 1 tablet (10 mEq total) by mouth daily.  Dispense: 30 tablet; Refill: 3 - hydrochlorothiazide (HYDRODIURIL) 25 MG tablet; Take 1 tablet (25 mg total) by mouth daily.  Dispense: 30 tablet; Refill: 3  At this point since her BP is in reasonably good control without medication and can not afford all of her medications,  will stop amlodipine and start HCTZ due to her pedal edema. For this reason she will probably need to continue with K+ supplement. -   2. Need for hepatitis C screening test  - Hepatitis C antibody, reflex  3. Screening for HIV (human immunodeficiency virus)  - HIV antibody (with reflex)  4. Gastroesophageal reflux disease, esophagitis presence not specified  - esomeprazole (NEXIUM) 40 MG capsule; Take 1 capsule (40 mg total) by mouth daily.  Dispense: 30 capsule; Refill: 3  5. Resolving URI -Continue symptomatic measures and follow-up if needed.   Return in about 6 months (around 05/01/2016). and prn.  The patient was given clear instructions to go to ER or return to medical center if symptoms don't improve, worsen or new problems develop. The patient verbalized understanding.    Micheline Chapman FNP  10/31/2015, 11:10 AM

## 2015-10-31 NOTE — Patient Instructions (Signed)
Stop amlodipine and start hydrodiuril 25 mg.  Contiune potassium since you will be on a fluid pill. Come back in 2 weeks for a nurse viist for a blood pressure check. Work on getting orange care or cone discount care. Make sure they know at the pharmacy that you are having trouble paying for medications You can get 81 mg  Aspirin at [facility] for 88 cents.

## 2015-11-03 ENCOUNTER — Telehealth: Payer: Self-pay

## 2015-11-03 NOTE — Telephone Encounter (Signed)
Called and left message asking patient to call back regarding labs. Thanks!

## 2015-11-03 NOTE — Telephone Encounter (Signed)
-----   Message from Micheline Chapman, NP sent at 11/03/2015 12:34 PM EDT ----- Potassium slightly low. Increase potassium to one twice a day.

## 2015-11-03 NOTE — Telephone Encounter (Signed)
Called and spoke with patient and advised of potassium level and to start taking potassium tablets 1 tablet twice daily. Patient verbalized understanding.  Thanks!

## 2015-11-14 ENCOUNTER — Other Ambulatory Visit: Payer: Self-pay | Admitting: Family Medicine

## 2015-11-14 ENCOUNTER — Ambulatory Visit: Payer: Self-pay

## 2015-11-14 VITALS — BP 164/86 | HR 84

## 2015-11-14 DIAGNOSIS — I10 Essential (primary) hypertension: Secondary | ICD-10-CM

## 2015-11-14 MED ORDER — LISINOPRIL 20 MG PO TABS: 20.0000 mg | ORAL_TABLET | Freq: Every day | ORAL | Status: DC

## 2015-11-21 NOTE — Progress Notes (Signed)
done

## 2015-11-21 NOTE — Progress Notes (Signed)
Vaughan Basta, can you close this BP check encounter from last Monday, I can not. Thank you

## 2015-12-23 MED FILL — HYDROCHLOROTHIAZIDE 25 MG T: 25 | 30 days supply | Qty: 30 | Fill #1

## 2015-12-23 MED FILL — ESOMEPRAZOLE MAG DR 40 MG C: 40 | 30 days supply | Qty: 30 | Fill #1

## 2015-12-23 MED FILL — POTASSIUM CL 10 MEQ TAB SA: 10 | 30 days supply | Qty: 30 | Fill #1

## 2016-01-11 ENCOUNTER — Encounter (HOSPITAL_COMMUNITY): Payer: Self-pay | Admitting: Emergency Medicine

## 2016-01-11 ENCOUNTER — Emergency Department (HOSPITAL_COMMUNITY)
Admission: EM | Admit: 2016-01-11 | Discharge: 2016-01-11 | Disposition: A | Discharge status: Home / Self Care | Payer: Self-pay | Attending: Emergency Medicine | Admitting: Emergency Medicine

## 2016-01-11 DIAGNOSIS — Z8719 Personal history of other diseases of the digestive system: Secondary | ICD-10-CM | POA: Insufficient documentation

## 2016-01-11 DIAGNOSIS — Z7982 Long term (current) use of aspirin: Secondary | ICD-10-CM | POA: Insufficient documentation

## 2016-01-11 DIAGNOSIS — M79651 Pain in right thigh: Secondary | ICD-10-CM | POA: Insufficient documentation

## 2016-01-11 DIAGNOSIS — Z87891 Personal history of nicotine dependence: Secondary | ICD-10-CM | POA: Insufficient documentation

## 2016-01-11 DIAGNOSIS — I10 Essential (primary) hypertension: Secondary | ICD-10-CM | POA: Insufficient documentation

## 2016-01-11 DIAGNOSIS — Z79899 Other long term (current) drug therapy: Secondary | ICD-10-CM | POA: Insufficient documentation

## 2016-01-11 DIAGNOSIS — M79606 Pain in leg, unspecified: Secondary | ICD-10-CM

## 2016-01-11 DIAGNOSIS — M79652 Pain in left thigh: Secondary | ICD-10-CM | POA: Insufficient documentation

## 2016-01-11 MED ORDER — NAPROXEN 500 MG PO TABS: 500.0000 mg | ORAL_TABLET | Freq: Two times a day (BID) | ORAL | Status: DC

## 2016-01-11 NOTE — ED Provider Notes (Signed)
CSN: [id number]     Arrival date & time 01/11/16  1259 History  By signing my name below, I, Nicole Kindred, attest that this documentation has been prepared under the direction and in the presence of [employer], PA-C.  Electronically Signed: Nicole Kindred, ED Scribe 01/11/2016 at 3:40 PM.  Chief Complaint  Patient presents with  . Leg Pain    The history is provided by the patient. No language interpreter was used.   HPI Comments: Jennifer Davila is a 60 y.o. female who presents to the Emergency Department complaining of gradual onset, bilateral buttock pain, ongoing for 6 months, worsening in the past few weeks. Pt reports her pain radiates bilaterally down her legs. [facility] but has not found a solution for her pain. No other associated symptoms noted. Pt has taken motrin 800mg  with no relief to symptoms. Pt has also taken potassium at home for the last year. Her pain is worse with movement. No other worsening or alleviating factors noted. Pt denies numbness, weakness, IV drug use, bowel incontinence, bladder incontinence, abdominal pain, difficulty ambulating, or any other pertinent symptoms.   Past Medical History  Diagnosis Date  . Hypertension   . GERD (gastroesophageal reflux disease)    Past Surgical History  Procedure Laterality Date  . Laparoscopic gastric banding    . Laparoscopic repair and removal of gastric band    . Abdominal hysterectomy    . Cholecystectomy     Family History  Problem Relation Age of Onset  . Diabetes Father    Social History  Substance Use Topics  . Smoking status: Former Smoker    Types: Cigarettes  . Smokeless tobacco: Never Used  . Alcohol Use: No   OB History    No data available     Review of Systems A complete 10 system review of systems was obtained and all systems are negative except as noted in the HPI and PMH.    Allergies  Review of patient's allergies indicates no known  allergies.  Home Medications   Prior to Admission medications   Medication Sig Start Date End Date Taking? Authorizing Provider  amLODipine (NORVASC) 10 MG tablet Take 1 tablet (10 mg total) by mouth daily. Patient not taking: Reported on 10/31/2015 08/29/15   Micheline Chapman, NP  aspirin 81 MG tablet Take 81 mg by mouth daily.    Historical Provider, MD  esomeprazole (NEXIUM) 40 MG capsule Take 1 capsule (40 mg total) by mouth daily. 10/31/15   Micheline Chapman, NP  famotidine (PEPCID) 20 MG tablet Take 1 tablet (20 mg total) by mouth 2 (two) times daily as needed for heartburn or indigestion. Patient not taking: Reported on 08/04/2015 05/23/15   Micheline Chapman, NP  hydrochlorothiazide (HYDRODIURIL) 25 MG tablet Take 1 tablet (25 mg total) by mouth daily. 10/31/15   Micheline Chapman, NP  ibuprofen (ADVIL,MOTRIN) 800 MG tablet Take 800 mg by mouth every 8 (eight) hours as needed for moderate pain. Reported on 10/31/2015    Historical Provider, MD  lisinopril (PRINIVIL,ZESTRIL) 20 MG tablet Take 1 tablet (20 mg total) by mouth daily. 11/14/15   Micheline Chapman, NP  naproxen (NAPROSYN) 500 MG tablet Take 1 tablet (500 mg total) by mouth 2 (two) times daily. 01/11/16   Comer Locket, PA-C  potassium chloride (K-DUR) 10 MEQ tablet Take 1 tablet (10 mEq total) by mouth daily. 10/31/15   Micheline Chapman, NP  pravastatin (PRAVACHOL) 20 MG tablet Take 1 tablet (20 mg total) by mouth daily. 08/09/15   Micheline Chapman, NP   BP 153/82 mmHg  Pulse 86  Temp(Src) 98.4 F (36.9 C) (Oral)  Resp 16  Ht 5\' 2"  (1.575 m)  Wt 234 lb (106.142 kg)  BMI 42.79 kg/m2  SpO2 100% Physical Exam  Constitutional: She appears well-developed and well-nourished. No distress.  Obese African-American female. No apparent distress  HENT:  Head: Normocephalic and atraumatic.  Eyes: Conjunctivae and EOM are normal.  Neck: Neck supple. No tracheal deviation present.  Cardiovascular: Normal rate and intact distal pulses.    Pulmonary/Chest: Effort normal. No respiratory distress.  Musculoskeletal: Normal range of motion. She exhibits no edema.  Full active range of motion. Pain is exacerbated with flexion at hip. Gait is baseline. Mild tenderness palpation over lateral aspect of bilateral upper extremities. No redness, swelling. No other objective findings  Neurological: She is alert.  Skin: Skin is warm and dry.  Psychiatric: She has a normal mood and affect. Her behavior is normal.    ED Course  Procedures (including critical care time) DIAGNOSTIC STUDIES: Oxygen Saturation is 100% on RA, normal by my interpretation.    COORDINATION OF CARE: 2:51 PM Discussed treatment plan with pt at bedside and pt agreed to plan.  Labs Review Labs Reviewed - No data to display  Imaging Review No results found. I have personally reviewed and evaluated these images and lab results as part of my medical decision-making.   EKG Interpretation None      MDM  Patient with bilateral, lateral thigh pain, likely musculoskeletal in nature. Ongoing over the past 6 months. No concerning findings on exam. Discussed switching NSAID therapy and follow-up with PCP. She verbalizes understanding, agrees with plan. No other questions or concerns prior to discharge. Overall appears well, nontoxic and appropriate for outpatient follow-up. Final diagnoses:  Leg pain, lateral, unspecified laterality   I personally performed the services described in this documentation, which was scribed in my presence. The recorded information has been reviewed and is accurate.      Comer Locket, PA-C 01/11/16 1540  Leo Grosser, MD 01/11/16 2003

## 2016-01-11 NOTE — Progress Notes (Signed)
Pt c/o bilateral sciatic pain times several months. Pt stated motrin is not helping the pain.

## 2016-01-11 NOTE — Discharge Instructions (Signed)
Your exam was reassuring. You may try taking naproxen instead of the ibuprofen. Do not take these medicines together as they report similarly. Follow-up with your doctor in the next week. Return to ED for new or worsening symptoms.  Musculoskeletal Pain Musculoskeletal pain is muscle and boney aches and pains. These pains can occur in any part of the body. Your caregiver may treat you without knowing the cause of the pain. They may treat you if blood or urine tests, X-rays, and other tests were normal.  CAUSES There is often not a definite cause or reason for these pains. These pains may be caused by a type of germ (virus). The discomfort may also come from overuse. Overuse includes working out too hard when your body is not fit. Boney aches also come from weather changes. Bone is sensitive to atmospheric pressure changes. HOME CARE INSTRUCTIONS   Ask when your test results will be ready. Make sure you get your test results.  Only take over-the-counter or prescription medicines for pain, discomfort, or fever as directed by your caregiver. If you were given medications for your condition, do not drive, operate machinery or power tools, or sign legal documents for 24 hours. Do not drink alcohol. Do not take sleeping pills or other medications that may interfere with treatment.  Continue all activities unless the activities cause more pain. When the pain lessens, slowly resume normal activities. Gradually increase the intensity and duration of the activities or exercise.  During periods of severe pain, bed rest may be helpful. Lay or sit in any position that is comfortable.  Putting ice on the injured area.  Put ice in a bag.  Place a towel between your skin and the bag.  Leave the ice on for 15 to 20 minutes, 3 to 4 times a day.  Follow up with your caregiver for continued problems and no reason can be found for the pain. If the pain becomes worse or does not go away, it may be necessary to  repeat tests or do additional testing. Your caregiver may need to look further for a possible cause. SEEK IMMEDIATE MEDICAL CARE IF:  You have pain that is getting worse and is not relieved by medications.  You develop chest pain that is associated with shortness or breath, sweating, feeling sick to your stomach (nauseous), or throw up (vomit).  Your pain becomes localized to the abdomen.  You develop any new symptoms that seem different or that concern you. MAKE SURE YOU:   Understand these instructions.  Will watch your condition.  Will get help right away if you are not doing well or get worse.   This information is not intended to replace advice given to you by your health care provider. Make sure you discuss any questions you have with your health care provider.   Document Released: 07/16/2005 Document Revised: 10/08/2011 Document Reviewed: 03/20/2013 Elsevier Interactive Patient Education [employer].

## 2016-01-11 NOTE — ED Notes (Signed)
Pt c/o pain to buttocks radiating down both legs. Worse with movement.  Pain began 3-4 months ago and has been getting worse.  Has not seen anyone for this.

## 2016-02-03 MED FILL — ESOMEPRAZOLE MAG DR 40 MG C: 40 | 30 days supply | Qty: 30 | Fill #2

## 2016-02-03 MED FILL — HYDROCHLOROTHIAZIDE 25 MG T: 25 | 30 days supply | Qty: 30 | Fill #2

## 2016-02-10 ENCOUNTER — Ambulatory Visit (INDEPENDENT_AMBULATORY_CARE_PROVIDER_SITE_OTHER): Payer: Self-pay | Admitting: Family Medicine

## 2016-02-10 ENCOUNTER — Encounter: Payer: Self-pay | Admitting: Family Medicine

## 2016-02-10 VITALS — BP 165/95 | HR 82 | Temp 98.2°F | Resp 16 | Ht 63.0 in | Wt 239.0 lb

## 2016-02-10 DIAGNOSIS — M25559 Pain in unspecified hip: Secondary | ICD-10-CM

## 2016-02-10 MED ORDER — PREDNISONE 20 MG PO TABS: ORAL_TABLET | ORAL | Status: DC

## 2016-02-10 MED FILL — predniSONE 20 MG TABS: 20 | 9 days supply | Qty: 18 | Fill #0

## 2016-02-10 NOTE — Progress Notes (Signed)
Patient ID: Jennifer Davila, female   DOB: [birth year], 60 y.o.   MRN: [id number]   Jennifer Davila, is a 60 y.o. female  [id number]  [id number]  DOB - [birth year]  CC:  Chief Complaint  Patient presents with  . Leg Pain    pain in both thighs that radiate down towards feet        HPI: Jennifer Davila is a 60 y.o. female here to follow-up on bilateral hip and leg pain. She has mentioned this on previous visits but has recently gotten worse.She has no insurance or financial aid so cannot afford to pay for imaging or evaluation by a specialist. Her blood pressure is elevated but she has not had her medication today. She described the pain as starting in the posterolateral hips and radiating down the outside of the legs. She voices not other complaints and denies associated symptoms.  No Known Allergies Past Medical History  Diagnosis Date  . Hypertension   . GERD (gastroesophageal reflux disease)    Current Outpatient Prescriptions on File Prior to Visit  Medication Sig Dispense Refill  . aspirin 81 MG tablet Take 81 mg by mouth daily.    Marland Kitchen esomeprazole (NEXIUM) 40 MG capsule Take 1 capsule (40 mg total) by mouth daily. 30 capsule 3  . hydrochlorothiazide (HYDRODIURIL) 25 MG tablet Take 1 tablet (25 mg total) by mouth daily. 30 tablet 3  . ibuprofen (ADVIL,MOTRIN) 800 MG tablet Take 800 mg by mouth every 8 (eight) hours as needed for moderate pain. Reported on 10/31/2015    . potassium chloride (K-DUR) 10 MEQ tablet Take 1 tablet (10 mEq total) by mouth daily. 30 tablet 3  . amLODipine (NORVASC) 10 MG tablet Take 1 tablet (10 mg total) by mouth daily. (Patient not taking: Reported on 10/31/2015) 90 tablet 3  . famotidine (PEPCID) 20 MG tablet Take 1 tablet (20 mg total) by mouth 2 (two) times daily as needed for heartburn or indigestion. (Patient not taking: Reported on 08/04/2015) 90 tablet 3  . lisinopril (PRINIVIL,ZESTRIL) 20 MG tablet Take 1 tablet (20 mg total) by mouth daily.  (Patient not taking: Reported on 02/10/2016) 90 tablet 3  . naproxen (NAPROSYN) 500 MG tablet Take 1 tablet (500 mg total) by mouth 2 (two) times daily. (Patient not taking: Reported on 02/10/2016) 30 tablet 0  . pravastatin (PRAVACHOL) 20 MG tablet Take 1 tablet (20 mg total) by mouth daily. (Patient not taking: Reported on 02/10/2016) 90 tablet 1   No current facility-administered medications on file prior to visit.   Family History  Problem Relation Age of Onset  . Diabetes Father    Social History   Social History  . Marital Status: Married    Spouse Name: N/A  . Number of Children: N/A  . Years of Education: N/A   Occupational History  . Not on file.   Social History Main Topics  . Smoking status: Former Smoker    Types: Cigarettes  . Smokeless tobacco: Never Used  . Alcohol Use: No  . Drug Use: No  . Sexual Activity: Not on file   Other Topics Concern  . Not on file   Social History Narrative    Review of Systems: Constitutional: Negative for fever, chills, appetite change, weight loss,  Fatigue. Skin: Negative for rashes or lesions of concern. HENT: Negative for ear pain, ear discharge.nose bleeds Eyes: Negative for pain, discharge, redness, itching and visual disturbance. Neck: Negative for pain, stiffness Respiratory: Negative for cough, shortness of breath,  Cardiovascular: Negative for chest pain, palpitations. Some swelling of lower legs and feet. Gastrointestinal: Negative for abdominal pain, nausea, vomiting, diarrhea, constipations Genitourinary: Negative for dysuria, urgency, frequency, hematuria,  Musculoskeletal: Negative for back pain, joint  Swelling. Negative for weakness.Positive for lip and leg pain with some gait difficulty due to pain. Neurological: Negative for dizziness, tremors, seizures, syncope,   light-headedness, numbness. Occasional migraine headaches Hematological: Negative for easy bruising or bleeding Psychiatric/Behavioral: Negative  for depression, anxiety, decreased concentration, confusion   Objective:   Filed Vitals:   02/10/16 1020  BP: 165/95  Pulse: 82  Temp: 98.2 F (36.8 C)  Resp: 16    Physical Exam: Constitutional: Patient appears well-developed and well-nourished. No distress. HENT: Normocephalic, atraumatic, External right and left ear normal. Oropharynx is clear and moist.  Eyes: Conjunctivae and EOM are normal. PERRLA, no scleral icterus. Neck: Normal ROM. Neck supple. No lymphadenopathy, No thyromegaly. CVS: RRR, S1/S2 +, no murmurs, no gallops, no rubs Pulmonary: Effort and breath sounds normal, no stridor, rhonchi, wheezes, rales.  Abdominal: Soft. Normoactive BS,, no distension, tenderness, rebound or guarding.  Musculoskeletal: Normal range of motion. No edema and no tenderness. There is pain with flexion, extension, internal and external rotation of the hips bilaterally Neuro: Alert.Normal muscle tone coordination. Non-focal Skin: Skin is warm and dry. No rash noted. Not diaphoretic. No erythema. No pallor. Psychiatric: Normal mood and affect. Behavior, judgment, thought content normal.  Lab Results  Component Value Date   WBC 6.3 08/04/2015   HGB 12.3 08/04/2015   HCT 38.4 08/04/2015   MCV 71.6* 08/04/2015   PLT 304 08/04/2015   Lab Results  Component Value Date   CREATININE 0.93 10/31/2015   BUN 15 10/31/2015   NA 142 10/31/2015   K 3.1* 10/31/2015   CL 100 10/31/2015   CO2 31 10/31/2015    No results found for: HGBA1C Lipid Panel     Component Value Date/Time   CHOL 205* 08/04/2015 1208   TRIG 115 08/04/2015 1208   HDL 44* 08/04/2015 1208   CHOLHDL 4.7 08/04/2015 1208   VLDL 23 08/04/2015 1208   LDLCALC 138* 08/04/2015 1208       Assessment and plan:   1. Hip pain, unspecified laterality  - predniSONE (DELTASONE) 20 MG tablet; Take 3 a day for 3 days, 2 a day for 3 days, then one a day for 3 days.  Dispense: 18 tablet; Refill: 0   Return if symptoms worsen  or fail to improve.  The patient was given clear instructions to go to ER or return to medical center if symptoms don't improve, worsen or new problems develop. The patient verbalized understanding.    Micheline Chapman FNP  02/10/2016, 2:30 PM

## 2016-02-10 NOTE — Patient Instructions (Signed)
Call when you finish the prednisone and let me know if pain has resolved.  Try to do range of motion exercises daily. May use ice or heat or alternate.

## 2016-03-13 MED FILL — HYDROCHLOROTHIAZIDE 25 MG T: 25 | 30 days supply | Qty: 30 | Fill #3

## 2016-03-13 MED FILL — ESOMEPRAZOLE MAG DR 40 MG C: 40 | 30 days supply | Qty: 30 | Fill #3

## 2016-03-13 MED FILL — POTASSIUM CL 10 MEQ TAB SA: 10 | 30 days supply | Qty: 30 | Fill #2

## 2016-03-30 ENCOUNTER — Emergency Department (HOSPITAL_COMMUNITY): Payer: Self-pay

## 2016-03-30 ENCOUNTER — Emergency Department (HOSPITAL_COMMUNITY)
Admission: EM | Admit: 2016-03-30 | Discharge: 2016-03-31 | Disposition: A | Discharge status: Home / Self Care | Payer: Self-pay | Attending: Emergency Medicine | Admitting: Emergency Medicine

## 2016-03-30 ENCOUNTER — Encounter (HOSPITAL_COMMUNITY): Payer: Self-pay | Admitting: *Deleted

## 2016-03-30 DIAGNOSIS — I10 Essential (primary) hypertension: Secondary | ICD-10-CM | POA: Insufficient documentation

## 2016-03-30 DIAGNOSIS — E876 Hypokalemia: Secondary | ICD-10-CM | POA: Insufficient documentation

## 2016-03-30 DIAGNOSIS — K5900 Constipation, unspecified: Secondary | ICD-10-CM | POA: Insufficient documentation

## 2016-03-30 DIAGNOSIS — Z7982 Long term (current) use of aspirin: Secondary | ICD-10-CM | POA: Insufficient documentation

## 2016-03-30 DIAGNOSIS — Z79899 Other long term (current) drug therapy: Secondary | ICD-10-CM | POA: Insufficient documentation

## 2016-03-30 DIAGNOSIS — Z87891 Personal history of nicotine dependence: Secondary | ICD-10-CM | POA: Insufficient documentation

## 2016-03-30 LAB — CBC
HCT: 42.4 % (ref 36.0–46.0)
Hemoglobin: 13.4 g/dL (ref 12.0–15.0)
MCH: 23.4 pg — ABNORMAL LOW (ref 26.0–34.0)
MCHC: 31.6 g/dL (ref 30.0–36.0)
MCV: 74.1 fL — ABNORMAL LOW (ref 78.0–100.0)
Platelets: 335 10*3/uL (ref 150–400)
RBC: 5.72 MIL/uL — ABNORMAL HIGH (ref 3.87–5.11)
RDW: 14.6 % (ref 11.5–15.5)
WBC: 9.6 10*3/uL (ref 4.0–10.5)

## 2016-03-30 LAB — LIPASE, BLOOD: Lipase: 21 U/L (ref 11–51)

## 2016-03-30 LAB — COMPREHENSIVE METABOLIC PANEL
ALT: 16 U/L (ref 14–54)
AST: 26 U/L (ref 15–41)
Albumin: 4.2 g/dL (ref 3.5–5.0)
Alkaline Phosphatase: 80 U/L (ref 38–126)
Anion gap: 14 (ref 5–15)
BUN: 13 mg/dL (ref 6–20)
CO2: 28 mmol/L (ref 22–32)
Calcium: 9.4 mg/dL (ref 8.9–10.3)
Chloride: 94 mmol/L — ABNORMAL LOW (ref 101–111)
Creatinine, Ser: 1.34 mg/dL — ABNORMAL HIGH (ref 0.44–1.00)
GFR calc Af Amer: 48 mL/min — ABNORMAL LOW (ref >60)
GFR calc non Af Amer: 41 mL/min — ABNORMAL LOW (ref >60)
Glucose, Bld: 221 mg/dL — ABNORMAL HIGH (ref 65–99)
Potassium: 2.7 mmol/L — CL (ref 3.5–5.1)
Sodium: 136 mmol/L (ref 135–145)
Total Bilirubin: 0.6 mg/dL (ref 0.3–1.2)
Total Protein: 7.7 g/dL (ref 6.5–8.1)

## 2016-03-30 LAB — POC OCCULT BLOOD, ED: Fecal Occult Bld: NEGATIVE

## 2016-03-30 MED ORDER — SODIUM CHLORIDE 0.9 % IV BOLUS (SEPSIS)
1000.0000 mL | Freq: Once | INTRAVENOUS | Status: AC
  Administered 2016-03-30: 1000 mL via INTRAVENOUS

## 2016-03-30 MED ORDER — LIDOCAINE HCL 2 % EX GEL
1.0000 "application " | Freq: Once | CUTANEOUS | Status: AC
  Administered 2016-03-30: 1 via TOPICAL
  Filled 2016-03-30: qty 20

## 2016-03-30 MED ORDER — POTASSIUM CHLORIDE CRYS ER 20 MEQ PO TBCR
80.0000 meq | EXTENDED_RELEASE_TABLET | Freq: Once | ORAL | Status: AC
  Administered 2016-03-30: 80 meq via ORAL
  Filled 2016-03-30: qty 4

## 2016-03-30 MED ORDER — POTASSIUM CHLORIDE 10 MEQ/100ML IV SOLN
10.0000 meq | INTRAVENOUS | Status: AC
  Administered 2016-03-30 – 2016-03-31 (×2): 10 meq via INTRAVENOUS
  Filled 2016-03-30 (×2): qty 100

## 2016-03-30 MED ORDER — FLEET ENEMA 7-19 GM/118ML RE ENEM
1.0000 | ENEMA | Freq: Once | RECTAL | Status: AC
  Administered 2016-03-30: 1 via RECTAL
  Filled 2016-03-30: qty 1

## 2016-03-30 NOTE — ED Triage Notes (Signed)
She also has hemorrhoids

## 2016-03-30 NOTE — ED Notes (Signed)
Patient transported to X-ray 

## 2016-03-30 NOTE — ED Triage Notes (Signed)
The pt is c/o abd pain and she has not had a bm since last Wednesday  lmp none

## 2016-03-30 NOTE — ED Notes (Signed)
Dr. Jerilee Field notified on pt.'s low Pottasium level . Pt. will be moved to next available bed at ER .

## 2016-03-30 NOTE — ED Provider Notes (Signed)
[facility] DEPT Provider Note   CSN: [id number] Arrival date & time: 03/30/16  2038     History   Chief Complaint Chief Complaint  Patient presents with  . Constipation    HPI Jennifer Davila is a 60 y.o. female.  60 year old female with a history of esophageal reflux and hypertension presents to the emergency department for evaluation of constipation. She states that her last bowel movement was 2 days ago. She states that she usually stools daily. She reports noticing hard stool on Tuesday, 3 days ago. She was able to have a bowel movement with straining. Stool was Daher in color with a small amount of bright red blood. She believes that the bright red blood is secondary to hemorrhoids. Patient states that she tried some magnesium citrate for constipation without relief. She reports anxiety with having a bowel movement secondary to discomfort. She reports some generalized abdominal discomfort. No fever PTA, nausea, vomiting, melena. Abdominal surgical hx significant for laparoscopic gastric banding and removal, abdominal hysterectomy, and cholecystectomy. She states that she takes oral potassium daily, but has missed some doses recently.   The history is provided by the patient. No language interpreter was used.  Constipation   Associated symptoms include abdominal pain.    Past Medical History:  Diagnosis Date  . GERD (gastroesophageal reflux disease)   . Hypertension     Patient Active Problem List   Diagnosis Date Noted  . Hip pain 05/25/2015    Past Surgical History:  Procedure Laterality Date  . ABDOMINAL HYSTERECTOMY    . CHOLECYSTECTOMY    . LAPAROSCOPIC GASTRIC BANDING    . LAPAROSCOPIC REPAIR AND REMOVAL OF GASTRIC BAND      OB History    No data available       Home Medications    Prior to Admission medications   Medication Sig Start Date End Date Taking? Authorizing Provider  amLODipine (NORVASC) 10 MG tablet Take 1 tablet (10 mg total) by mouth  daily. Patient not taking: Reported on 10/31/2015 08/29/15   Micheline Chapman, NP  aspirin 81 MG tablet Take 81 mg by mouth daily.    Historical Provider, MD  esomeprazole (NEXIUM) 40 MG capsule Take 1 capsule (40 mg total) by mouth daily. 10/31/15   Micheline Chapman, NP  famotidine (PEPCID) 20 MG tablet Take 1 tablet (20 mg total) by mouth 2 (two) times daily as needed for heartburn or indigestion. Patient not taking: Reported on 08/04/2015 05/23/15   Micheline Chapman, NP  hydrochlorothiazide (HYDRODIURIL) 25 MG tablet Take 1 tablet (25 mg total) by mouth daily. 10/31/15   Micheline Chapman, NP  ibuprofen (ADVIL,MOTRIN) 800 MG tablet Take 800 mg by mouth every 8 (eight) hours as needed for moderate pain. Reported on 10/31/2015    Historical Provider, MD  lisinopril (PRINIVIL,ZESTRIL) 20 MG tablet Take 1 tablet (20 mg total) by mouth daily. Patient not taking: Reported on 02/10/2016 11/14/15   Micheline Chapman, NP  naproxen (NAPROSYN) 500 MG tablet Take 1 tablet (500 mg total) by mouth 2 (two) times daily. Patient not taking: Reported on 02/10/2016 01/11/16   Comer Locket, PA-C  potassium chloride (K-DUR) 10 MEQ tablet Take 1 tablet (10 mEq total) by mouth daily. 10/31/15   Micheline Chapman, NP  pravastatin (PRAVACHOL) 20 MG tablet Take 1 tablet (20 mg total) by mouth daily. Patient not taking: Reported on 02/10/2016 08/09/15   Micheline Chapman, NP  predniSONE (DELTASONE) 20 MG tablet Take 3 a day  for 3 days, 2 a day for 3 days, then one a day for 3 days. 02/10/16   Micheline Chapman, NP    Family History Family History  Problem Relation Age of Onset  . Diabetes Father     Social History Social History  Substance Use Topics  . Smoking status: Former Smoker    Types: Cigarettes  . Smokeless tobacco: Never Used  . Alcohol use No     Allergies   Review of patient's allergies indicates no known allergies.   Review of Systems Review of Systems  Gastrointestinal: Positive for abdominal pain  and constipation.  Ten systems reviewed and are negative for acute change, except as noted in the HPI.    Physical Exam Updated Vital Signs BP 137/60   Pulse 86   Temp 99.4 F (37.4 C) (Oral)   Resp 15   SpO2 99%   Physical Exam  Constitutional: She is oriented to person, place, and time. She appears well-developed and well-nourished. No distress.  Nontoxic appearing and in NAD  HENT:  Head: Normocephalic and atraumatic.  Eyes: Conjunctivae and EOM are normal. No scleral icterus.  Neck: Normal range of motion.  Cardiovascular: Normal rate, regular rhythm and intact distal pulses.   Pulmonary/Chest: Effort normal. No respiratory distress. She has no wheezes.  Respirations even and unlabored  Abdominal: Soft. She exhibits no distension and no mass. There is no guarding.  Soft, obese abdomen. No focal TTP. No palpable masses. No peritoneal signs.  Genitourinary:  Genitourinary Comments: Normal rectal tones. Sehgal stool noted on DRE. No perianal induration, erythema, or heat to touch. No anal fissure. No external hemorrhoids.  Musculoskeletal: Normal range of motion.  Neurological: She is alert and oriented to person, place, and time.  GCS 15. Patient moving all extremities.  Skin: Skin is warm and dry. No rash noted. She is not diaphoretic. No erythema. No pallor.  Psychiatric: She has a normal mood and affect. Her behavior is normal.  Nursing note and vitals reviewed.    ED Treatments / Results  Labs (all labs ordered are listed, but only abnormal results are displayed) Labs Reviewed  COMPREHENSIVE METABOLIC PANEL - Abnormal; Notable for the following:       Result Value   Potassium 2.7 (*)    Chloride 94 (*)    Glucose, Bld 221 (*)    Creatinine, Ser 1.34 (*)    GFR calc non Af Amer 41 (*)    GFR calc Af Amer 48 (*)    All other components within normal limits  CBC - Abnormal; Notable for the following:    RBC 5.72 (*)    MCV 74.1 (*)    MCH 23.4 (*)    All other  components within normal limits  LIPASE, BLOOD  POC OCCULT BLOOD, ED    EKG  EKG Interpretation None       Radiology Dg Abd 2 Views  Result Date: 03/30/2016 CLINICAL DATA:  Abdominal pain, constipation EXAM: ABDOMEN - 2 VIEW COMPARISON:  None. FINDINGS: Nonobstructive bowel gas pattern. Normal colonic stool burden. No evidence of free air under the diaphragm on the upright view. Cholecystectomy clips. Mild degenerative changes of the lower lumbar spine. IMPRESSION: No evidence of small bowel obstruction or free air. Normal colonic stool burden. Electronically Signed   By: Julian Hy M.D.   On: 03/30/2016 22:45    Procedures Procedures (including critical care time)  Medications Ordered in ED Medications  sodium chloride 0.9 % bolus 1,000  mL (0 mLs Intravenous Stopped 03/31/16 0205)  potassium chloride 10 mEq in 100 mL IVPB (0 mEq Intravenous Stopped 03/31/16 0123)  potassium chloride SA (K-DUR,KLOR-CON) CR tablet 80 mEq (80 mEq Oral Given 03/30/16 2312)  lidocaine (XYLOCAINE) 2 % jelly 1 application (1 application Topical Given 03/30/16 2312)  sodium phosphate (FLEET) 7-19 GM/118ML enema 1 enema (1 enema Rectal Given 03/30/16 2353)     Initial Impression / Assessment and Plan / ED Course  I have reviewed the triage vital signs and the nursing notes.  Pertinent labs & imaging results that were available during my care of the patient were reviewed by me and considered in my medical decision making (see chart for details).  Clinical Course    11:16 PM X-ray without evidence of obstruction or free air. X-ray does show constipation, by my interpretation. Plan to administer enema to try and encourage patient to have a bowel movement. Potassium started and is infusing.  1:00 AM Patient reassessed. She has has a small bowel movement of Threat stool. She feels like there is something "stuck". Attempted disimpaction but very little stool in rectal vault. Patient tolerated procedure well.  She does report some improvement in her symptoms with management in the ED. IV potassium completed. Plan to manage as outpatient with Miralax and Colace. Patient given GI referral. Return precautions discussed and provided. Patient discharged in satisfactory condition with no unaddressed concerns.   Final Clinical Impressions(s) / ED Diagnoses   Final diagnoses:  Constipation  Hypokalemia    New Prescriptions Discharge Medication List as of 03/31/2016  1:27 AM    START taking these medications   Details  docusate sodium (COLACE) 100 MG capsule Take 1 capsule (100 mg total) by mouth every 12 (twelve) hours., Starting Sat 03/31/2016, Print    polyethylene glycol powder (GLYCOLAX/MIRALAX) powder Take 17 g by mouth 2 (two) times daily. Until daily soft stools  OTC, Starting Sat 03/31/2016, Print         [place], [place] 03/31/16 [place], MD 04/01/16 [phone]

## 2016-03-31 MED ORDER — DOCUSATE SODIUM 100 MG PO CAPS: 100.0000 mg | ORAL_CAPSULE | Freq: Two times a day (BID) | ORAL | 0 refills | Status: DC

## 2016-03-31 MED ORDER — POLYETHYLENE GLYCOL 3350 17 GM/SCOOP PO POWD: 17.0000 g | Freq: Two times a day (BID) | ORAL | 1 refills | Status: DC

## 2016-03-31 NOTE — Discharge Instructions (Signed)
Take MiraLAX and Colace as prescribed to try and correct a bowel movement. You may also increase the daily fiber in your diet. Follow-up with a gastroenterologist if you continue to have issues with constipation. Follow up with your primary care doctor within the week to have your potassium level rechecked. Continue taking your daily prescribed potassium tablets. You may return for any new or concerning symptoms.

## 2016-03-31 NOTE — ED Notes (Signed)
Patient Alert and oriented X4. Stable and ambulatory. Patient verbalized understanding of the discharge instructions.  Patient belongings were taken by the patient.  

## 2016-05-03 ENCOUNTER — Ambulatory Visit (INDEPENDENT_AMBULATORY_CARE_PROVIDER_SITE_OTHER): Payer: Self-pay | Admitting: Family Medicine

## 2016-05-03 VITALS — BP 168/86 | HR 84 | Temp 98.2°F | Resp 14 | Ht 63.0 in | Wt 243.0 lb

## 2016-05-03 DIAGNOSIS — E7800 Pure hypercholesterolemia, unspecified: Secondary | ICD-10-CM

## 2016-05-03 DIAGNOSIS — Z23 Encounter for immunization: Secondary | ICD-10-CM

## 2016-05-03 DIAGNOSIS — Z1211 Encounter for screening for malignant neoplasm of colon: Secondary | ICD-10-CM

## 2016-05-03 DIAGNOSIS — I10 Essential (primary) hypertension: Secondary | ICD-10-CM

## 2016-05-03 DIAGNOSIS — K219 Gastro-esophageal reflux disease without esophagitis: Secondary | ICD-10-CM

## 2016-05-03 MED ORDER — LISINOPRIL 20 MG PO TABS: 20.0000 mg | ORAL_TABLET | Freq: Every day | ORAL | 3 refills | Status: DC

## 2016-05-03 MED ORDER — HYDROCHLOROTHIAZIDE 25 MG PO TABS: 25.0000 mg | ORAL_TABLET | Freq: Every day | ORAL | 3 refills | Status: DC

## 2016-05-03 MED ORDER — AMLODIPINE BESYLATE 10 MG PO TABS: 10.0000 mg | ORAL_TABLET | Freq: Every day | ORAL | 3 refills | Status: DC

## 2016-05-03 MED ORDER — ESOMEPRAZOLE MAGNESIUM 40 MG PO CPDR: 40.0000 mg | DELAYED_RELEASE_CAPSULE | Freq: Every day | ORAL | 3 refills | Status: DC

## 2016-05-03 MED ORDER — PRAVASTATIN SODIUM 20 MG PO TABS: 20.0000 mg | ORAL_TABLET | Freq: Every day | ORAL | 1 refills | Status: DC

## 2016-05-03 MED ORDER — ZOLPIDEM TARTRATE 5 MG PO TABS: 5.0000 mg | ORAL_TABLET | Freq: Every evening | ORAL | 1 refills | Status: DC | PRN

## 2016-05-03 MED FILL — HYDROCHLOROTHIAZIDE 25 MG T: 25 | 30 days supply | Qty: 30 | Fill #0

## 2016-05-03 MED FILL — AMLODIPINE BESYLATE 10 MG T: 10 | 30 days supply | Qty: 90 | Fill #0

## 2016-05-03 MED FILL — ?LISINOPRIL 20 MG TABLET: 20 | 30 days supply | Qty: 30 | Fill #0

## 2016-05-03 MED FILL — ESOMEPRAZOLE MAG DR 40 MG C: 40 | 30 days supply | Qty: 30 | Fill #0

## 2016-05-03 MED FILL — PRAVASTATIN NA 20 MG TAB: 20 | 30 days supply | Qty: 30 | Fill #0

## 2016-05-03 NOTE — Progress Notes (Signed)
Jennifer Davila, is a 60 y.o. female  [id number]  [id number]  DOB - 04/17/1956  CC:  Chief Complaint  Patient presents with  . Follow-up    potassium check   . Hypertension       HPI: Jennifer Davila is a 60 y.o. female here for follow-up of chronic conditions.She has a diagnosis of both HTN and GERD. She is on hctz, amoldipine and lisinopril as in medication list. She is on nexium for GERD. She also asks for a prescription for ambien for occ use for insomnia. She has no major complaints today.  She reports eating a low salt diet and getting no regular exercise.   Health Maintentance: She is to receive a flu shot today. She will reshedule soon for PAP smear. Will provide stool cards for colon cancer screening. She has had a hysterectomy.  No Known Allergies Past Medical History:  Diagnosis Date  . GERD (gastroesophageal reflux disease)   . Hypertension    Current Outpatient Prescriptions on File Prior to Visit  Medication Sig Dispense Refill  . aspirin 81 MG tablet Take 81 mg by mouth every morning.     . docusate sodium (COLACE) 100 MG capsule Take 1 capsule (100 mg total) by mouth every 12 (twelve) hours. 30 capsule 0  . ibuprofen (ADVIL,MOTRIN) 200 MG tablet Take 200 mg by mouth daily as needed for moderate pain.    . potassium chloride (K-DUR) 10 MEQ tablet Take 1 tablet (10 mEq total) by mouth daily. 30 tablet 3  . famotidine (PEPCID) 20 MG tablet Take 1 tablet (20 mg total) by mouth 2 (two) times daily as needed for heartburn or indigestion. (Patient not taking: Reported on 05/03/2016) 90 tablet 3  . polyethylene glycol powder (GLYCOLAX/MIRALAX) powder Take 17 g by mouth 2 (two) times daily. Until daily soft stools  OTC (Patient not taking: Reported on 05/03/2016) 119 g 1   No current facility-administered medications on file prior to visit.    Family History  Problem Relation Age of Onset  . Diabetes Father    Social History   Social History  . Marital  status: Married    Spouse name: N/A  . Number of children: N/A  . Years of education: N/A   Occupational History  . Not on file.   Social History Main Topics  . Smoking status: Former Smoker    Types: Cigarettes  . Smokeless tobacco: Never Used  . Alcohol use No  . Drug use: No  . Sexual activity: Not on file   Other Topics Concern  . Not on file   Social History Narrative  . No narrative on file    Review of Systems: Constitutional: Negative Skin: Negative HENT: Negative  Eyes: Negative  Neck: Negative Respiratory: Negative Cardiovascular: +for some swelling of lower legs and feet.  Gastrointestinal: + for constipation and heartburn Genitourinary: Negative  Musculoskeletal: Negative   Neurological: Negative for Hematological: Negative  Psychiatric/Behavioral: Negative. + for insomnia   Objective:   Vitals:   05/03/16 1121  BP: (!) 168/86  Pulse: 84  Resp: 14  Temp: 98.2 F (36.8 C)    Physical Exam: Constitutional: Patient appears well-developed and well-nourished. No distress. HENT: Normocephalic, atraumatic, External right and left ear normal. Oropharynx is clear and moist.  Eyes: Conjunctivae and EOM are normal. PERRLA, no scleral icterus. Neck: Normal ROM. Neck supple. No lymphadenopathy, No thyromegaly. CVS: RRR, S1/S2 +, no murmurs, no gallops, no rubs Pulmonary: Effort and breath sounds normal, no stridor,  rhonchi, wheezes, rales.  Abdominal: Soft. Normoactive BS,, no distension, tenderness, rebound or guarding.  Musculoskeletal: Normal range of motion. No edema and no tenderness.  Neuro: Alert.Normal muscle tone coordination. Non-focal Skin: Skin is warm and dry. No rash noted. Not diaphoretic. No erythema. No pallor. Psychiatric: Normal mood and affect. Behavior, judgment, thought content normal.  Lab Results  Component Value Date   WBC 9.6 03/30/2016   HGB 13.4 03/30/2016   HCT 42.4 03/30/2016   MCV 74.1 (L) 03/30/2016   PLT 335  03/30/2016   Lab Results  Component Value Date   CREATININE 1.34 (H) 03/30/2016   BUN 13 03/30/2016   NA 136 03/30/2016   K 2.7 (LL) 03/30/2016   CL 94 (L) 03/30/2016   CO2 28 03/30/2016    No results found for: HGBA1C Lipid Panel     Component Value Date/Time   CHOL 205 (H) 08/04/2015 1208   TRIG 115 08/04/2015 1208   HDL 44 (L) 08/04/2015 1208   CHOLHDL 4.7 08/04/2015 1208   VLDL 23 08/04/2015 1208   LDLCALC 138 (H) 08/04/2015 1208       Assessment and plan:   1. Essential hypertension  - COMPLETE METABOLIC PANEL WITH GFR - Hemoglobin A1c - Lipid panel - amLODipine (NORVASC) 10 MG tablet; Take 1 tablet (10 mg total) by mouth daily.  Dispense: 90 tablet; Refill: 3 - lisinopril (PRINIVIL,ZESTRIL) 20 MG tablet; Take 1 tablet (20 mg total) by mouth daily.  Dispense: 90 tablet; Refill: 3 - hydrochlorothiazide (HYDRODIURIL) 25 MG tablet; Take 1 tablet (25 mg total) by mouth daily.  Dispense: 30 tablet; Refill: 3  2. Gastroesophageal reflux disease, esophagitis presence not specified  - esomeprazole (NEXIUM) 40 MG capsule; Take 1 capsule (40 mg total) by mouth daily.  Dispense: 30 capsule; Refill: 3  3. Pure hypercholesterolemia  - pravastatin (PRAVACHOL) 20 MG tablet; Take 1 tablet (20 mg total) by mouth daily.  Dispense: 90 tablet; Refill: 1  4. Colon cancer screening  - POC Hemoccult Bld/Stl (3-Cd Home Screen); Future  5. Need for prophylactic vaccination and inoculation against influenza  - Flu Vaccine QUAD 36+ mos PF IM (Fluarix & Fluzone Quad PF)   Return in about 2 years (around 05/03/2018) for HTN, nurse visit..  The patient was given clear instructions to go to ER or return to medical center if symptoms don't improve, worsen or new problems develop. The patient verbalized understanding.    Micheline Chapman FNP  05/03/2016, 3:02 PM

## 2016-05-03 NOTE — Patient Instructions (Signed)
Come in in 2 weeks for BP check by nurse.

## 2016-05-04 LAB — COMPLETE METABOLIC PANEL WITH GFR
ALT: 11 U/L (ref 6–29)
AST: 18 U/L (ref 10–35)
Albumin: 4 g/dL (ref 3.6–5.1)
Alkaline Phosphatase: 78 U/L (ref 33–130)
BUN: 10 mg/dL (ref 7–25)
CO2: 24 mmol/L (ref 20–31)
Calcium: 8.5 mg/dL — ABNORMAL LOW (ref 8.6–10.4)
Chloride: 105 mmol/L (ref 98–110)
Creat: 1.03 mg/dL — ABNORMAL HIGH (ref 0.50–0.99)
GFR, Est African American: 67 mL/min (ref >=60)
GFR, Est Non African American: 58 mL/min — ABNORMAL LOW (ref >=60)
Glucose, Bld: 91 mg/dL (ref 65–99)
Potassium: 3.1 mmol/L — ABNORMAL LOW (ref 3.5–5.3)
Sodium: 142 mmol/L (ref 135–146)
Total Bilirubin: 0.4 mg/dL (ref 0.2–1.2)
Total Protein: 6.5 g/dL (ref 6.1–8.1)

## 2016-05-04 LAB — LIPID PANEL
Cholesterol: 194 mg/dL (ref 125–200)
HDL: 40 mg/dL — ABNORMAL LOW (ref >=46)
LDL Cholesterol: 135 mg/dL — ABNORMAL HIGH (ref <130)
Total CHOL/HDL Ratio: 4.9 Ratio (ref <=5.0)
Triglycerides: 93 mg/dL (ref <150)
VLDL: 19 mg/dL (ref <30)

## 2016-05-04 LAB — HEMOGLOBIN A1C
Hgb A1c MFr Bld: 5.7 % — ABNORMAL HIGH (ref <5.7)
Mean Plasma Glucose: 117 mg/dL

## 2016-05-04 MED FILL — ZOLPIDEM TARTRATE 5 MG TAB: 5 | 15 days supply | Qty: 15 | Fill #0

## 2016-05-08 MED FILL — POTASSIUM CL 10 MEQ TAB SA: 10 | 30 days supply | Qty: 30 | Fill #3

## 2016-05-18 ENCOUNTER — Ambulatory Visit: Payer: Self-pay

## 2016-05-29 ENCOUNTER — Ambulatory Visit (INDEPENDENT_AMBULATORY_CARE_PROVIDER_SITE_OTHER): Payer: Self-pay | Admitting: Family Medicine

## 2016-05-29 ENCOUNTER — Encounter: Payer: Self-pay | Admitting: Family Medicine

## 2016-05-29 VITALS — BP 154/85 | HR 91 | Temp 99.0°F | Resp 20 | Ht 63.0 in | Wt 236.0 lb

## 2016-05-29 DIAGNOSIS — R059 Cough, unspecified: Secondary | ICD-10-CM

## 2016-05-29 DIAGNOSIS — R05 Cough: Secondary | ICD-10-CM

## 2016-05-29 MED ORDER — GUAIFENESIN-CODEINE 100-10 MG/5ML PO SOLN: 5.0000 mL | Freq: Four times a day (QID) | ORAL | 0 refills | Status: DC | PRN

## 2016-05-29 MED ORDER — AMOXICILLIN 500 MG PO CAPS: 500.0000 mg | ORAL_CAPSULE | Freq: Three times a day (TID) | ORAL | 0 refills | Status: DC

## 2016-05-29 MED FILL — AMOXICILLIN 500 MG CAPSULE: 500 | 10 days supply | Qty: 30 | Fill #0

## 2016-05-29 MED FILL — GUAIFENESIN AC COUGH SYRUP: 100-10 | 6 days supply | Qty: 120 | Fill #0

## 2016-05-29 NOTE — Progress Notes (Signed)
Jennifer Davila, is a 60 y.o. female  [id number]  [id number]  DOB - 1955-12-25  CC:  Chief Complaint  Patient presents with  . Cough  . Nasal Congestion       HPI: Jennifer Davila is a 60 y.o. female here complaining of a 3 week history of cough. She reports nasal congestion, popping ears, sore throat. She denies fever, chills. Has been fatigued. The cough is keeping her awake.  No Known Allergies Past Medical History:  Diagnosis Date  . GERD (gastroesophageal reflux disease)   . Hypertension    Current Outpatient Prescriptions on File Prior to Visit  Medication Sig Dispense Refill  . amLODipine (NORVASC) 10 MG tablet Take 1 tablet (10 mg total) by mouth daily. 90 tablet 3  . aspirin 81 MG tablet Take 81 mg by mouth every morning.     Marland Kitchen esomeprazole (NEXIUM) 40 MG capsule Take 1 capsule (40 mg total) by mouth daily. 30 capsule 3  . hydrochlorothiazide (HYDRODIURIL) 25 MG tablet Take 1 tablet (25 mg total) by mouth daily. 30 tablet 3  . ibuprofen (ADVIL,MOTRIN) 200 MG tablet Take 200 mg by mouth daily as needed for moderate pain.    Marland Kitchen lisinopril (PRINIVIL,ZESTRIL) 20 MG tablet Take 1 tablet (20 mg total) by mouth daily. 90 tablet 3  . potassium chloride (K-DUR) 10 MEQ tablet Take 1 tablet (10 mEq total) by mouth daily. 30 tablet 3  . pravastatin (PRAVACHOL) 20 MG tablet Take 1 tablet (20 mg total) by mouth daily. 90 tablet 1  . zolpidem (AMBIEN) 5 MG tablet Take 1 tablet (5 mg total) by mouth at bedtime as needed for sleep. 15 tablet 1  . docusate sodium (COLACE) 100 MG capsule Take 1 capsule (100 mg total) by mouth every 12 (twelve) hours. 30 capsule 0  . famotidine (PEPCID) 20 MG tablet Take 1 tablet (20 mg total) by mouth 2 (two) times daily as needed for heartburn or indigestion. (Patient not taking: Reported on 05/29/2016) 90 tablet 3  . polyethylene glycol powder (GLYCOLAX/MIRALAX) powder Take 17 g by mouth 2 (two) times daily. Until daily soft stools  OTC  (Patient not taking: Reported on 05/29/2016) 119 g 1   No current facility-administered medications on file prior to visit.    Family History  Problem Relation Age of Onset  . Diabetes Father    Social History   Social History  . Marital status: Married    Spouse name: N/A  . Number of children: N/A  . Years of education: N/A   Occupational History  . Not on file.   Social History Main Topics  . Smoking status: Former Smoker    Types: Cigarettes  . Smokeless tobacco: Never Used  . Alcohol use No  . Drug use: No  . Sexual activity: Not on file   Other Topics Concern  . Not on file   Social History Narrative  . No narrative on file    Review of Systems: See HPI Objective:   Vitals:   05/29/16 0811  BP: (!) 154/85  Pulse: 91  Resp: 20  Temp: 99 F (37.2 C)    Physical Exam: Constitutional: Patient appears well-developed and well-nourished. No distress. HENT: Normocephalic, atraumatic, External right and left ear normal. Oropharynx is clear and moist.  Eyes: Conjunctivae and EOM are normal. PERRLA, no scleral icterus. Neck: Normal ROM. Neck supple. No lymphadenopathy, No thyromegaly. CVS: RRR, S1/S2 +, no murmurs, no gallops, no rubs Pulmonary: Effort and breath sounds normal, no  stridor, rhonchi, wheezes, rales.  Skin:  Warm and dry  Lab Results  Component Value Date   WBC 9.6 03/30/2016   HGB 13.4 03/30/2016   HCT 42.4 03/30/2016   MCV 74.1 (L) 03/30/2016   PLT 335 03/30/2016   Lab Results  Component Value Date   CREATININE 1.03 (H) 05/03/2016   BUN 10 05/03/2016   NA 142 05/03/2016   K 3.1 (L) 05/03/2016   CL 105 05/03/2016   CO2 24 05/03/2016    Lab Results  Component Value Date   HGBA1C 5.7 (H) 05/03/2016   Lipid Panel     Component Value Date/Time   CHOL 194 05/03/2016 1152   TRIG 93 05/03/2016 1152   HDL 40 (L) 05/03/2016 1152   CHOLHDL 4.9 05/03/2016 1152   VLDL 19 05/03/2016 1152   LDLCALC 135 (H) 05/03/2016 1152        Assessment and plan:   1. Cough, for 3 weeks.  -amoxicillin 500 #30, one po tid -Guiafenesin with codiene, 120 cc, 1 tsp q 6 hours prn cough. -discussed other symptomatic measures   The patient was given clear instructions to go to ER or return to medical center if symptoms don't improve, worsen or new problems develop. The patient verbalized understanding.    Micheline Chapman FNP  05/29/2016, 9:03 AM

## 2016-05-29 NOTE — Patient Instructions (Signed)
Follow up as needed

## 2016-06-05 ENCOUNTER — Other Ambulatory Visit: Payer: Self-pay | Admitting: Family Medicine

## 2016-06-05 DIAGNOSIS — I10 Essential (primary) hypertension: Secondary | ICD-10-CM

## 2016-06-05 MED FILL — ESOMEPRAZOLE MAG DR 40 MG C: 40 | 30 days supply | Qty: 30 | Fill #1

## 2016-06-05 MED FILL — POTASSIUM CL 10 MEQ TAB SA: 10 | 30 days supply | Qty: 30 | Fill #0

## 2016-06-06 MED FILL — HYDROCHLOROTHIAZIDE 25 MG T: 25 | 30 days supply | Qty: 30 | Fill #1

## 2016-06-06 MED FILL — PRAVASTATIN NA 20 MG TAB: 20 | 30 days supply | Qty: 30 | Fill #1

## 2016-07-06 MED FILL — POTASSIUM CL 10 MEQ TAB SA: 10 | 30 days supply | Qty: 30 | Fill #1

## 2016-07-06 MED FILL — HYDROCHLOROTHIAZIDE 25 MG T: 25 | 30 days supply | Qty: 30 | Fill #2

## 2016-07-06 MED FILL — ESOMEPRAZOLE MAG DR 40 MG C: 40 | 30 days supply | Qty: 30 | Fill #2

## 2016-08-06 ENCOUNTER — Ambulatory Visit: Payer: Self-pay | Admitting: Family Medicine

## 2016-08-08 MED FILL — ?ESOMEPRAZOLE MAG DR 40MG C: 40 | 30 days supply | Qty: 30 | Fill #3

## 2016-08-08 MED FILL — POTASSIUM CL 10 MEQ TAB SA: 10 | 30 days supply | Qty: 30 | Fill #2

## 2016-08-08 MED FILL — HYDROCHLOROTHIAZIDE 25 MG T: 25 | 30 days supply | Qty: 30 | Fill #3

## 2016-08-20 ENCOUNTER — Encounter: Payer: Self-pay | Admitting: Gastroenterology

## 2016-08-21 MED FILL — ZOLPIDEM TARTRATE 5 MG TAB: 5 | 15 days supply | Qty: 15 | Fill #1

## 2016-08-25 ENCOUNTER — Encounter (HOSPITAL_COMMUNITY): Payer: Self-pay

## 2016-08-25 ENCOUNTER — Emergency Department (HOSPITAL_COMMUNITY)
Admission: EM | Admit: 2016-08-25 | Discharge: 2016-08-25 | Disposition: A | Discharge status: Home / Self Care | Payer: BLUE CROSS/BLUE SHIELD | Attending: Emergency Medicine | Admitting: Emergency Medicine

## 2016-08-25 DIAGNOSIS — Z79899 Other long term (current) drug therapy: Secondary | ICD-10-CM | POA: Insufficient documentation

## 2016-08-25 DIAGNOSIS — K029 Dental caries, unspecified: Secondary | ICD-10-CM | POA: Diagnosis not present

## 2016-08-25 DIAGNOSIS — K0889 Other specified disorders of teeth and supporting structures: Secondary | ICD-10-CM | POA: Diagnosis present

## 2016-08-25 DIAGNOSIS — Z7982 Long term (current) use of aspirin: Secondary | ICD-10-CM | POA: Diagnosis not present

## 2016-08-25 DIAGNOSIS — Z87891 Personal history of nicotine dependence: Secondary | ICD-10-CM | POA: Diagnosis not present

## 2016-08-25 DIAGNOSIS — I10 Essential (primary) hypertension: Secondary | ICD-10-CM | POA: Insufficient documentation

## 2016-08-25 MED ORDER — AMOXICILLIN-POT CLAVULANATE 875-125 MG PO TABS: 1.0000 | ORAL_TABLET | Freq: Two times a day (BID) | ORAL | 0 refills | Status: DC

## 2016-08-25 MED ORDER — IBUPROFEN 600 MG PO TABS: 600.0000 mg | ORAL_TABLET | Freq: Four times a day (QID) | ORAL | 0 refills | Status: DC | PRN

## 2016-08-25 NOTE — Discharge Instructions (Signed)
Please return to the emergency department if experiencing difficulty breathing, increased swelling, fever, chills, difficulty swallowing or any new or worsening of current symptoms.

## 2016-08-25 NOTE — ED Triage Notes (Signed)
Pt c/o L lower dental x "months."  Pain score 10/10.  Pt has not taken anything for pain.  Pt reports that she does not have a dentist.

## 2016-08-25 NOTE — ED Provider Notes (Signed)
[facility] DEPT Provider Note   CSN: [id number] Arrival date & time: 08/25/16  [id number]   By signing my name below, I, Eunice Blase, attest that this documentation has been prepared under the direction and in the presence of Avie Echevaria, PA-C. Electronically Signed: Eunice Blase, Scribe. 08/25/16. 10:38 AM.   History   Chief Complaint Chief Complaint  Patient presents with  . Dental Pain   The history is provided by the patient and medical records. No language interpreter was used.    HPI Comments: Jennifer Davila is a 61 y.o. female who presents to the Emergency Department complaining of left sided lower dental pain x 2 weeks. She states her pain is worse with air contact, her pain radiates throughout the bottom teeth, and she describes the pain as pulsing and shooting, sensitive to hot and cold. She states she cannot bite down d/t pain, her pain keeps her awake at night and she also notes she could not brush her teeth this morning. Pt reveals Hx of dental problems, stating she has had one pulled in the past. She states she has not attempted any treatment at home. She adds that her PCP told her not to take tylenol because the amount she was taking was affecting her liver. Hx of HTN noted. Pt states she takes potassium pills and hydrochlorothiazide at home. She denies fever, chills, N/V, Hx of DM and trouble breathing or swallowing. Pt has no primary dentist.  Past Medical History:  Diagnosis Date  . GERD (gastroesophageal reflux disease)   . Hypertension     Patient Active Problem List   Diagnosis Date Noted  . Hip pain 05/25/2015    Past Surgical History:  Procedure Laterality Date  . ABDOMINAL HYSTERECTOMY    . CHOLECYSTECTOMY    . LAPAROSCOPIC GASTRIC BANDING    . LAPAROSCOPIC REPAIR AND REMOVAL OF GASTRIC BAND      OB History    No data available       Home Medications    Prior to Admission medications   Medication Sig Start Date End Date Taking?  Authorizing Provider  amLODipine (NORVASC) 10 MG tablet Take 1 tablet (10 mg total) by mouth daily. 05/03/16   Micheline Chapman, NP  amoxicillin (AMOXIL) 500 MG capsule Take 1 capsule (500 mg total) by mouth 3 (three) times daily. 05/29/16   Micheline Chapman, NP  amoxicillin-clavulanate (AUGMENTIN) 875-125 MG tablet Take 1 tablet by mouth every 12 (twelve) hours. 08/25/16   Emeline General, PA-C  aspirin 81 MG tablet Take 81 mg by mouth every morning.     Historical Provider, MD  docusate sodium (COLACE) 100 MG capsule Take 1 capsule (100 mg total) by mouth every 12 (twelve) hours. 03/31/16   Antonietta Breach, PA-C  esomeprazole (NEXIUM) 40 MG capsule Take 1 capsule (40 mg total) by mouth daily. 05/03/16   Micheline Chapman, NP  famotidine (PEPCID) 20 MG tablet Take 1 tablet (20 mg total) by mouth 2 (two) times daily as needed for heartburn or indigestion. Patient not taking: Reported on 05/29/2016 05/23/15   Micheline Chapman, NP  guaiFENesin-codeine 100-10 MG/5ML syrup Take 5 mLs by mouth every 6 (six) hours as needed for cough. 05/29/16   Micheline Chapman, NP  hydrochlorothiazide (HYDRODIURIL) 25 MG tablet Take 1 tablet (25 mg total) by mouth daily. 05/03/16   Micheline Chapman, NP  ibuprofen (ADVIL,MOTRIN) 600 MG tablet Take 1 tablet (600 mg total) by mouth every 6 (six) hours as needed. 08/25/16  Emeline General, PA-C  lisinopril (PRINIVIL,ZESTRIL) 20 MG tablet Take 1 tablet (20 mg total) by mouth daily. 05/03/16   Micheline Chapman, NP  polyethylene glycol powder (GLYCOLAX/MIRALAX) powder Take 17 g by mouth 2 (two) times daily. Until daily soft stools  OTC Patient not taking: Reported on 05/29/2016 03/31/16   Antonietta Breach, PA-C  potassium chloride (K-DUR) 10 MEQ tablet TAKE 1 TABLET BY MOUTH DAILY. 06/05/16   Micheline Chapman, NP  pravastatin (PRAVACHOL) 20 MG tablet Take 1 tablet (20 mg total) by mouth daily. 05/03/16   Micheline Chapman, NP  zolpidem (AMBIEN) 5 MG tablet Take 1 tablet (5 mg  total) by mouth at bedtime as needed for sleep. 05/03/16   Micheline Chapman, NP    Family History Family History  Problem Relation Age of Onset  . Diabetes Father     Social History Social History  Substance Use Topics  . Smoking status: Former Smoker    Types: Cigarettes  . Smokeless tobacco: Never Used  . Alcohol use No     Allergies   Patient has no known allergies.   Review of Systems Review of Systems  Constitutional: Negative for chills and fever.  HENT: Positive for dental problem (lower left, radiating throughout lower). Negative for drooling, facial swelling, mouth sores, sore throat and trouble swallowing.   Respiratory: Negative for choking, chest tightness, shortness of breath, wheezing and stridor.   Cardiovascular: Negative for chest pain.  Gastrointestinal: Negative for abdominal pain, diarrhea, nausea and vomiting.  Genitourinary: Negative for dysuria and hematuria.  Musculoskeletal: Negative for arthralgias, myalgias, neck pain and neck stiffness.  Skin: Negative for color change and pallor.  Allergic/Immunologic: Negative for immunocompromised state.  Neurological: Negative for weakness and numbness.  Psychiatric/Behavioral: Negative for confusion.     Physical Exam Updated Vital Signs BP 157/98 (BP Location: Left Arm)   Pulse 94   Temp 99.2 F (37.3 C)   Resp 18   Ht 5\' 2"  (1.575 m)   Wt 243 lb (110.2 kg)   SpO2 100%   BMI 44.45 kg/m   Physical Exam  Constitutional: She is oriented to person, place, and time. Vital signs are normal. She appears well-developed and well-nourished.  Non-toxic appearance. No distress.  Afebrile, nontoxic, NAD  HENT:  Head: Normocephalic and atraumatic.  Mouth/Throat: Oropharynx is clear and moist and mucous membranes are normal.    No obvious evidence of dental abscess. No concern for Ludwig's. Sublingual mucosa is soft and non-tender.  Eyes: Conjunctivae and EOM are normal. Right eye exhibits no discharge.  Left eye exhibits no discharge.  Neck: Normal range of motion. Neck supple.  Cardiovascular: Normal rate, regular rhythm, normal heart sounds and intact distal pulses.   Pulmonary/Chest: Effort normal and breath sounds normal. No respiratory distress. She has no wheezes. She has no rales. She exhibits no tenderness.  Abdominal: Normal appearance. She exhibits no distension.  Musculoskeletal: Normal range of motion.  Neurological: She is alert and oriented to person, place, and time. She has normal strength. No sensory deficit.  Skin: Skin is warm, dry and intact. No rash noted. No pallor.  Psychiatric: She has a normal mood and affect. Her behavior is normal.  Nursing note and vitals reviewed.  ED Treatments / Results  DIAGNOSTIC STUDIES: Oxygen Saturation is 100% on RA, normal by my interpretation.    COORDINATION OF CARE: 10:32 AM Discussed treatment plan with pt at bedside and pt agreed to plan. Will order ibuprofen, a Rx for  abx and refer Pt to dentist in the area.  Labs (all labs ordered are listed, but only abnormal results are displayed) Labs Reviewed - No data to display  EKG  EKG Interpretation None       Radiology No results found.  Procedures Procedures (including critical care time)  Medications Ordered in ED Medications - No data to display   Initial Impression / Assessment and Plan / ED Course  I have reviewed the triage vital signs and the nursing notes.  Pertinent labs & imaging results that were available during my care of the patient were reviewed by me and considered in my medical decision making (see chart for details).     61 year old female presenting with 2 weeks of dental pain. Reassuring exam. No obvious oral abscess noted. Posterior oropharynx is clear and arches are intact. Midline uvula. No exudate. Sublingual mucosa is soft and non-tender. Non-concerning for Ludwig angina  Patient is afebrile, non-toxic appearing and in no apparent  distress. Discharge home with symptomatic relief and close follow-up with dentist. She was provided with a list of dentists in the area.  Discussed strict return precautions. Patient was advised to return to the emergency department if experiencing any worsening of symptoms. She understood instructions and agreed with discharge plan.  Noted elevated blood pressure while in emergency department. Discussed with patient need to follow up with primary care and she understood and agreed.  I personally performed the services described in this documentation, which was scribed in my presence. The recorded information has been reviewed and is accurate.  Final Clinical Impressions(s) / ED Diagnoses   Final diagnoses:  Pain, dental  Dental caries    New Prescriptions New Prescriptions   AMOXICILLIN-CLAVULANATE (AUGMENTIN) 875-125 MG TABLET    Take 1 tablet by mouth every 12 (twelve) hours.   IBUPROFEN (ADVIL,MOTRIN) 600 MG TABLET    Take 1 tablet (600 mg total) by mouth every 6 (six) hours as needed.     Emeline General, PA-C 08/25/16 [place], MD 08/26/16 [phone]

## 2016-09-03 MED FILL — ?GABAPENTIN 100 MG CAP: 20 days supply | Qty: 60 | Fill #0

## 2016-09-04 MED FILL — LOSARTAN POTASSIUM 50 MG TA: 50 | 30 days supply | Qty: 30 | Fill #0

## 2016-09-13 ENCOUNTER — Other Ambulatory Visit: Payer: Self-pay | Admitting: Internal Medicine

## 2016-09-13 DIAGNOSIS — M5442 Lumbago with sciatica, left side: Secondary | ICD-10-CM

## 2016-09-13 DIAGNOSIS — G8929 Other chronic pain: Secondary | ICD-10-CM

## 2016-09-13 DIAGNOSIS — M5441 Lumbago with sciatica, right side: Secondary | ICD-10-CM

## 2016-09-27 ENCOUNTER — Inpatient Hospital Stay
Admission: RE | Admit: 2016-09-27 | Discharge: 2016-09-27 | Disposition: A | Discharge status: Home / Self Care | Payer: BLUE CROSS/BLUE SHIELD | Source: Ambulatory Visit | Attending: Internal Medicine | Admitting: Internal Medicine

## 2016-10-02 ENCOUNTER — Ambulatory Visit (AMBULATORY_SURGERY_CENTER): Payer: Self-pay

## 2016-10-02 VITALS — Ht 62.0 in | Wt 241.4 lb

## 2016-10-02 DIAGNOSIS — Z8371 Family history of colonic polyps: Secondary | ICD-10-CM

## 2016-10-02 DIAGNOSIS — Z86010 Personal history of colonic polyps: Secondary | ICD-10-CM

## 2016-10-02 MED ORDER — NA SULFATE-K SULFATE-MG SULF 17.5-3.13-1.6 GM/177ML PO SOLN: ORAL | 0 refills | Status: DC

## 2016-10-02 NOTE — Progress Notes (Signed)
Per pt, no allergies to soy or egg products.Pt not taking any weight loss meds or using  O2 at home.  Pt states she had GI history in [place]. over 12 years ago. She has a history of colon polyps. Pt was informed to bring a copy of the colon report to her appointment. She understood

## 2016-10-03 ENCOUNTER — Encounter: Payer: Self-pay | Admitting: Gastroenterology

## 2016-10-04 MED FILL — SUPREP BOWEL PREP KIT: 17.5-3.13-1 | 1 days supply | Qty: 354 | Fill #0

## 2016-10-05 MED FILL — POTASSIUM CL 10 MEQ TAB SA: 10 | 30 days supply | Qty: 30 | Fill #3

## 2016-10-05 MED FILL — SIMVASTATIN 40 MG TABLET: 40 | 90 days supply | Qty: 90 | Fill #0

## 2016-10-10 ENCOUNTER — Ambulatory Visit (AMBULATORY_SURGERY_CENTER): Payer: BLUE CROSS/BLUE SHIELD | Admitting: Gastroenterology

## 2016-10-10 ENCOUNTER — Encounter: Payer: Self-pay | Admitting: Gastroenterology

## 2016-10-10 VITALS — BP 141/79 | HR 76 | Temp 97.5°F | Resp 15 | Ht 62.0 in | Wt 241.0 lb

## 2016-10-10 DIAGNOSIS — Z86010 Personal history of colonic polyps: Secondary | ICD-10-CM

## 2016-10-10 DIAGNOSIS — D123 Benign neoplasm of transverse colon: Secondary | ICD-10-CM | POA: Diagnosis not present

## 2016-10-10 DIAGNOSIS — K635 Polyp of colon: Secondary | ICD-10-CM

## 2016-10-10 DIAGNOSIS — D120 Benign neoplasm of cecum: Secondary | ICD-10-CM

## 2016-10-10 MED ORDER — SODIUM CHLORIDE 0.9 % IV SOLN: 500.0000 mL | INTRAVENOUS | Status: DC

## 2016-10-10 NOTE — Progress Notes (Signed)
Called to room to assist during endoscopic procedure.  Patient ID and intended procedure confirmed with present staff. Received instructions for my participation in the procedure from the performing physician.  

## 2016-10-10 NOTE — Patient Instructions (Signed)
Handouts given on polyps, diverticulosis and hemorrhoids   YOU HAD AN ENDOSCOPIC PROCEDURE TODAY: Refer to the procedure report and other information in the discharge instructions given to you for any specific questions about what was found during the examination. If this information does not answer your questions, please call [place] office at [phone] to clarify.   YOU SHOULD EXPECT: Some feelings of bloating in the abdomen. Passage of more gas than usual. Walking can help get rid of the air that was put into your GI tract during the procedure and reduce the bloating. If you had a lower endoscopy (such as a colonoscopy or flexible sigmoidoscopy) you may notice spotting of blood in your stool or on the toilet paper. Some abdominal soreness may be present for a day or two, also.  DIET: Your first meal following the procedure should be a light meal and then it is ok to progress to your normal diet. A half-sandwich or bowl of soup is an example of a good first meal. Heavy or fried foods are harder to digest and may make you feel nauseous or bloated. Drink plenty of fluids but you should avoid alcoholic beverages for 24 hours. If you had a esophageal dilation, please see attached instructions for diet.    ACTIVITY: Your care partner should take you home directly after the procedure. You should plan to take it easy, moving slowly for the rest of the day. You can resume normal activity the day after the procedure however YOU SHOULD NOT DRIVE, use power tools, machinery or perform tasks that involve climbing or major physical exertion for 24 hours (because of the sedation medicines used during the test).   SYMPTOMS TO REPORT IMMEDIATELY: A gastroenterologist can be reached at any hour. Please call [phone]  for any of the following symptoms:  Following lower endoscopy (colonoscopy, flexible sigmoidoscopy) Excessive amounts of blood in the stool  Significant tenderness, worsening of abdominal pains   Swelling of the abdomen that is new, acute  Fever of 100 or higher    FOLLOW UP:  If any biopsies were taken you will be contacted by phone or by letter within the next 1-3 weeks. Call [phone]  if you have not heard about the biopsies in 3 weeks.  Please also call with any specific questions about appointments or follow up tests.  

## 2016-10-10 NOTE — Op Note (Signed)
[facility] Patient Name: Jennifer Davila Procedure Date: 10/10/2016 9:27 AM MRN: [id number] Endoscopist: Remo Lipps P. Jennifer Justin MD, MD Age: 61 Referring MD:  Date of Birth: 1955/10/03 Gender: Female Account #: [id number] Procedure:                Colonoscopy Indications:              High risk colon cancer surveillance: Personal                            history of colonic polyps Medicines:                Monitored Anesthesia Care Procedure:                Pre-Anesthesia Assessment:                           - Prior to the procedure, a History and Physical                            was performed, and patient medications and                            allergies were reviewed. The patient's tolerance of                            previous anesthesia was also reviewed. The risks                            and benefits of the procedure and the sedation                            options and risks were discussed with the patient.                            All questions were answered, and informed consent                            was obtained. Prior Anticoagulants: The patient has                            taken aspirin, last dose was 1 day prior to                            procedure. ASA Grade Assessment: II - A patient                            with mild systemic disease. After reviewing the                            risks and benefits, the patient was deemed in                            satisfactory condition to undergo the procedure.  After obtaining informed consent, the colonoscope                            was passed under direct vision. Throughout the                            procedure, the patient's blood pressure, pulse, and                            oxygen saturations were monitored continuously. The                            Colonoscope was introduced through the anus and                            advanced to the the cecum,  identified by                            appendiceal orifice and ileocecal valve. The                            colonoscopy was performed without difficulty. The                            patient tolerated the procedure well. The quality                            of the bowel preparation was adequate. The                            ileocecal valve, appendiceal orifice, and rectum                            were photographed. Scope In: 9:33:18 AM Scope Out: 9:48:15 AM Scope Withdrawal Time: 0 hours 12 minutes 18 seconds  Total Procedure Duration: 0 hours 14 minutes 57 seconds  Findings:                 The perianal and digital rectal examinations were                            normal.                           A 3 mm polyp was found in the cecum. The polyp was                            sessile. The polyp was removed with a cold snare.                            Resection and retrieval were complete.                           A 4 mm polyp was found in the hepatic flexure. The  polyp was sessile. The polyp was removed with a                            cold snare. Resection and retrieval were complete.                           Scattered medium-mouthed diverticula were found in                            the entire colon.                           Internal hemorrhoids were found during retroflexion.                           Anal papilla(e) were hypertrophied.                           The exam was otherwise without abnormality. Complications:            No immediate complications. Estimated blood loss:                            Minimal. Estimated Blood Loss:     Estimated blood loss was minimal. Impression:               - One 3 mm polyp in the cecum, removed with a cold                            snare. Resected and retrieved.                           - One 4 mm polyp at the hepatic flexure, removed                            with a cold snare. Resected  and retrieved.                           - Diverticulosis in the entire examined colon.                           - Internal hemorrhoids.                           - Anal papilla(e) were hypertrophied.                           - The examination was otherwise normal. Recommendation:           - Patient has a contact number available for                            emergencies. The signs and symptoms of potential                            delayed complications were discussed with the  patient. Return to normal activities tomorrow.                            Written discharge instructions were provided to the                            patient.                           - Resume previous diet.                           - Continue present medications.                           - No ibuprofen, naproxen, or other non-steroidal                            anti-inflammatory drugs for 2 weeks after polyp                            removal.                           - Await pathology results.                           - Repeat colonoscopy is recommended for                            surveillance. The colonoscopy date will be                            determined after pathology results from today's                            exam become available for review. [name] MD, MD 10/10/2016 9:52:44 AM This report has been signed electronically.

## 2016-10-10 NOTE — Progress Notes (Signed)
To PACU, vss patent aw report to rn 

## 2016-10-11 ENCOUNTER — Telehealth: Payer: Self-pay | Admitting: *Deleted

## 2016-10-11 NOTE — Telephone Encounter (Signed)
  Follow up Call-  Call back number 10/10/2016  Post procedure Call Back phone  # [phone]  Permission to leave phone message Yes     No answer, left message.

## 2016-10-11 NOTE — Telephone Encounter (Signed)
  Follow up Call-  Call back number 10/10/2016  Post procedure Call Back phone  # [phone]  Permission to leave phone message Yes     Patient questions:  Do you have a fever, pain , or abdominal swelling? No. Pain Score  0 *  Have you tolerated food without any problems? Yes.    Have you been able to return to your normal activities? Yes.    Do you have any questions about your discharge instructions: Diet   No. Medications  No. Follow up visit  No.  Do you have questions or concerns about your Care? No.  Actions: * If pain score is 4 or above: No action needed, pain <4.

## 2016-10-16 ENCOUNTER — Encounter: Payer: Self-pay | Admitting: Gastroenterology

## 2016-10-19 ENCOUNTER — Telehealth: Payer: Self-pay | Admitting: Gastroenterology

## 2016-10-19 MED FILL — GABAPENTIN 600 MG TABLET: 600 | 30 days supply | Qty: 30 | Fill #0

## 2016-10-19 NOTE — Telephone Encounter (Signed)
Received patient  previous EGD report and placed on Dr.Armbruster's desk for review and sign off

## 2016-11-08 MED FILL — LOSARTAN POTASSIUM 50 MG TA: 50 | 30 days supply | Qty: 30 | Fill #0

## 2016-11-08 MED FILL — ?ESOMEPRAZOLE MAG DR 40 MG: 40 MG | 30 days supply | Qty: 30 | Fill #0

## 2016-11-12 ENCOUNTER — Ambulatory Visit: Payer: BLUE CROSS/BLUE SHIELD | Admitting: Family Medicine

## 2016-11-15 MED FILL — traMADol HCL 50 MG TABS: 50 | 15 days supply | Qty: 45 | Fill #0

## 2016-12-11 MED FILL — LOSARTAN POTASSIUM 50 MG TA: 50 | 30 days supply | Qty: 30 | Fill #1

## 2016-12-11 MED FILL — ?ESOMEPRAZOLE MAG DR 40 MG: 40 MG | 30 days supply | Qty: 30 | Fill #1

## 2017-01-16 MED FILL — ?ESOMEPRAZOLE MAG DR 40 MG: 40 MG | 30 days supply | Qty: 30 | Fill #2

## 2017-01-16 MED FILL — LOSARTAN POTASSIUM 50 MG TA: 50 | 30 days supply | Qty: 30 | Fill #2

## 2017-02-06 MED FILL — POTASSIUM CL 10 MEQ TAB SA: 10 | 30 days supply | Qty: 60 | Fill #0

## 2017-02-06 MED FILL — GABAPENTIN 600 MG TABLET: 600 | 30 days supply | Qty: 90 | Fill #0

## 2017-02-25 MED FILL — LOSARTAN POTASSIUM 50 MG TA: 50 | 30 days supply | Qty: 30 | Fill #3

## 2017-02-25 MED FILL — ESOMEPRAZOLE MAG DR 40 MG C: 40 | 30 days supply | Qty: 30 | Fill #3

## 2017-04-04 MED FILL — SIMVASTATIN 40 MG TABLET: 40 | 90 days supply | Qty: 90 | Fill #1

## 2017-04-04 MED FILL — LOSARTAN POTASSIUM 50 MG TA: 50 | 30 days supply | Qty: 30 | Fill #4

## 2017-04-04 MED FILL — ESOMEPRAZOLE MAG DR 40 MG C: 40 | 30 days supply | Qty: 30 | Fill #4

## 2017-06-11 MED FILL — LOSARTAN POTASSIUM 50 MG TA: 50 | 30 days supply | Qty: 30 | Fill #5

## 2017-09-04 ENCOUNTER — Ambulatory Visit: Payer: BLUE CROSS/BLUE SHIELD | Admitting: Family Medicine

## 2017-09-04 ENCOUNTER — Encounter: Payer: Self-pay | Admitting: Family Medicine

## 2017-09-04 VITALS — BP 140/88 | HR 76 | Temp 98.2°F | Resp 12 | Ht 62.0 in | Wt 246.0 lb

## 2017-09-04 DIAGNOSIS — M79604 Pain in right leg: Secondary | ICD-10-CM

## 2017-09-04 DIAGNOSIS — M79605 Pain in left leg: Secondary | ICD-10-CM | POA: Diagnosis not present

## 2017-09-04 DIAGNOSIS — E785 Hyperlipidemia, unspecified: Secondary | ICD-10-CM | POA: Insufficient documentation

## 2017-09-04 DIAGNOSIS — R42 Dizziness and giddiness: Secondary | ICD-10-CM | POA: Insufficient documentation

## 2017-09-04 DIAGNOSIS — I10 Essential (primary) hypertension: Secondary | ICD-10-CM | POA: Diagnosis not present

## 2017-09-04 DIAGNOSIS — Z6841 Body Mass Index (BMI) 40.0 and over, adult: Secondary | ICD-10-CM | POA: Diagnosis not present

## 2017-09-04 DIAGNOSIS — E669 Obesity, unspecified: Secondary | ICD-10-CM | POA: Insufficient documentation

## 2017-09-04 DIAGNOSIS — R0989 Other specified symptoms and signs involving the circulatory and respiratory systems: Secondary | ICD-10-CM | POA: Diagnosis not present

## 2017-09-04 LAB — COMPREHENSIVE METABOLIC PANEL
ALT: 11 U/L (ref 0–35)
AST: 14 U/L (ref 0–37)
Albumin: 4.1 g/dL (ref 3.5–5.2)
Alkaline Phosphatase: 111 U/L (ref 39–117)
BUN: 17 mg/dL (ref 6–23)
CO2: 32 mEq/L (ref 19–32)
Calcium: 8.8 mg/dL (ref 8.4–10.5)
Chloride: 102 mEq/L (ref 96–112)
Creatinine, Ser: 0.93 mg/dL (ref 0.40–1.20)
GFR: 78.09 mL/min (ref >60.00)
Glucose, Bld: 104 mg/dL — ABNORMAL HIGH (ref 70–99)
Potassium: 3.4 mEq/L — ABNORMAL LOW (ref 3.5–5.1)
Sodium: 141 mEq/L (ref 135–145)
Total Bilirubin: 0.8 mg/dL (ref 0.2–1.2)
Total Protein: 7.1 g/dL (ref 6.0–8.3)

## 2017-09-04 LAB — LIPID PANEL
Cholesterol: 197 mg/dL (ref 0–200)
HDL: 38.9 mg/dL — ABNORMAL LOW (ref >39.00)
LDL Cholesterol: 134 mg/dL — ABNORMAL HIGH (ref 0–99)
NonHDL: 158.01
Total CHOL/HDL Ratio: 5
Triglycerides: 122 mg/dL (ref 0.0–149.0)
VLDL: 24.4 mg/dL (ref 0.0–40.0)

## 2017-09-04 MED ORDER — LOSARTAN POTASSIUM 50 MG PO TABS: 50.0000 mg | ORAL_TABLET | Freq: Every day | ORAL | 1 refills | Status: DC

## 2017-09-04 MED ORDER — DULOXETINE HCL 30 MG PO CPEP: 30.0000 mg | ORAL_CAPSULE | Freq: Every day | ORAL | 1 refills | Status: DC

## 2017-09-04 MED ORDER — PRAVASTATIN SODIUM 20 MG PO TABS: 20.0000 mg | ORAL_TABLET | Freq: Every day | ORAL | 1 refills | Status: DC

## 2017-09-04 NOTE — Assessment & Plan Note (Signed)
Resume Pravastatin 20 mg daily. Low fat diet also recommended. Further recommendations will be given according to lab results. F/U in 6-12 months.

## 2017-09-04 NOTE — Assessment & Plan Note (Signed)
We discussed other possible etiologies of dizziness, Hx and examination today suggest benign vertigo. No medication recommended for now. Fall prevention. Instructed about warning signs. Carotid US will be arranged.

## 2017-09-04 NOTE — Progress Notes (Signed)
HPI:   [name] is a 62 y.o. female, who is here today to establish care.  Former PCP: Ms Jennifer Kingfisher, NP Last preventive routine visit: 2017-2018.  Chronic medical problems: GERD,HLD,HTN,leg pain.  HTN  Dx about 40 years ago. She is on Losartan 50 mg daily,has not taken it for 2 weeks. She does not check BP at home. She has not had eye exam since 2015. Denies severe/frequent headache, visual changes, chest pain, dyspnea, palpitation, claudication, focal weakness, or edema.  HypoK+ on KCL 10 meq 2 tabs daily.   HLD:  She is on Pravastatin 40 mg daily, has not taken it for 2 weeks.  07/2016 TC 195,LDL 126,TG 156,and HDL 38. She is tolerating medication well,no side effects reported.  She is not consistent with a healthy diet and does not exercise regularly.   Muscle cramps on LE's:  She was evaluated by ortho and Gabapentin was recommended. She takes Gabapentin prn,she does not feel like it helps. She had lumbar MRI done,not sure about results.   Muscle spasm of posterior aspect of thighs, pretibial and toes. Arthralgias.  Problem is getting worse. She has not noted erythema,edema,or skin rash.  She has not noted associated lower back pain,numbness,tingling,or saddle anesthesia. Cramps are exacerbated by sitting for long time and when in bed at night. Alleviated by movement.  GERD: She is on Esomeprazole 40 mg daily. As far as she takes medication daily,symptoms are well controlled.  She had some dizziness when lying down on examination table and getting up. She has had dizziness for a while,exacerbated by head movements,sometimes like spinning sensation. No hearing loss or tinnitus, occasionally "popping" ears.    Review of Systems  Constitutional: Negative for activity change, appetite change, fatigue and fever.  HENT: Negative for mouth sores, nosebleeds and trouble swallowing.   Eyes: Negative for redness and visual disturbance.    Respiratory: Negative for cough, shortness of breath and wheezing.   Cardiovascular: Negative for chest pain, palpitations and leg swelling.  Gastrointestinal: Negative for abdominal pain, nausea and vomiting.       Negative for changes in bowel habits.  Endocrine: Negative for cold intolerance, heat intolerance, polydipsia, polyphagia and polyuria.  Genitourinary: Negative for decreased urine volume, dysuria and hematuria.  Musculoskeletal: Positive for arthralgias and myalgias.  Neurological: Positive for dizziness. Negative for syncope, weakness, numbness and headaches.  Psychiatric/Behavioral: Negative for confusion. The patient is nervous/anxious.       Current Outpatient Medications on File Prior to Visit  Medication Sig Dispense Refill  . aspirin 81 MG tablet Take 81 mg by mouth every morning.     . docusate sodium (COLACE) 100 MG capsule Take 1 capsule (100 mg total) by mouth every 12 (twelve) hours. 30 capsule 0  . esomeprazole (NEXIUM) 40 MG capsule Take 1 capsule (40 mg total) by mouth daily. 30 capsule 3  . ibuprofen (ADVIL,MOTRIN) 600 MG tablet Take 1 tablet (600 mg total) by mouth every 6 (six) hours as needed. 30 tablet 0  . famotidine (PEPCID) 20 MG tablet Take 1 tablet (20 mg total) by mouth 2 (two) times daily as needed for heartburn or indigestion. (Patient not taking: Reported on 10/10/2016) 90 tablet 3  . polyethylene glycol powder (GLYCOLAX/MIRALAX) powder Take 17 g by mouth 2 (two) times daily. Until daily soft stools  OTC (Patient not taking: Reported on 09/04/2017) 119 g 1   Current Facility-Administered Medications on File Prior to Visit  Medication Dose Route Frequency Provider Last  Rate Last Dose  . 0.9 %  sodium chloride infusion  500 mL Intravenous Continuous Armbruster, Carlota Raspberry, MD         Past Medical History:  Diagnosis Date  . Arthritis   . Constipation    on stool softener  . GERD (gastroesophageal reflux disease)   . Hyperlipidemia   .  Hypertension   . Trouble swallowing 2013   following lap band   No Known Allergies  Family History  Problem Relation Age of Onset  . Hypertension Mother   . Stroke Mother   . Diabetes Father   . Alcohol abuse Father   . Kidney disease Father   . Colon polyps Brother   . Cancer Brother   . Alcohol abuse Brother     Social History   Socioeconomic History  . Marital status: Married    Spouse name: None  . Number of children: 1  . Years of education: None  . Highest education level: None  Social Needs  . Financial resource strain: None  . Food insecurity - worry: None  . Food insecurity - inability: None  . Transportation needs - medical: None  . Transportation needs - non-medical: None  Occupational History  . None  Tobacco Use  . Smoking status: Former Smoker    Types: Cigarettes    Last attempt to quit: 10/02/1972    Years since quitting: 44.9  . Smokeless tobacco: Never Used  Substance and Sexual Activity  . Alcohol use: No  . Drug use: No  . Sexual activity: No  Other Topics Concern  . None  Social History Narrative  . None    Vitals:   09/04/17 0931  BP: 140/88  Pulse: 76  Resp: 12  Temp: 98.2 F (36.8 C)  SpO2: 96%    Body mass index is 44.99 kg/m.   Physical Exam  Nursing note and vitals reviewed. Constitutional: She is oriented to person, place, and time. She appears well-developed. No distress.  HENT:  Head: Normocephalic and atraumatic.  Right Ear: Hearing, tympanic membrane, external ear and ear canal normal.  Left Ear: Hearing, tympanic membrane, external ear and ear canal normal.  Mouth/Throat: Oropharynx is clear and moist and mucous membranes are normal.  Dizziness with head movement and when setting up.  Eyes: Conjunctivae are normal. Pupils are equal, round, and reactive to light.  Neck: Carotid bruit is present. No tracheal deviation present. No thyroid mass and no thyromegaly present.  Cardiovascular: Normal rate and regular  rhythm.  No murmur heard. Pulses:      Dorsalis pedis pulses are 2+ on the right side, and 2+ on the left side.  Respiratory: Effort normal and breath sounds normal. No respiratory distress.  GI: Soft. She exhibits no mass. There is no hepatomegaly. There is no tenderness.  Musculoskeletal: She exhibits edema (1+ pitting LE edema,bilateral.).       Right knee: She exhibits normal range of motion and no effusion. No tenderness found.       Lumbar back: She exhibits no tenderness and no bony tenderness.  Left knee crepitus and mild limitation of flexion.  Lymphadenopathy:    She has no cervical adenopathy.  Neurological: She is alert and oriented to person, place, and time. She has normal strength. Coordination normal.  Skin: Skin is warm. No rash noted. No erythema.  Psychiatric: Her mood appears anxious.  Well groomed, good eye contact.      ASSESSMENT AND PLAN:   Ms. Lindia was seen today  for establish care.   Orders Placed This Encounter  Procedures  . Lipid panel  . Comprehensive metabolic panel    Carotid bruit, unspecified laterality  Bilateral. Instructed about warning signs. Further recommendations will be given according to imaging results.  -     VAS US CAROTID; Future   Morbid obesity with BMI of 40.0-44.9, adult ([place])  We discussed benefits of wt loss as well as adverse effects of obesity. Consistency with healthy diet and physical activity recommended. Daily brisk walking for 15-30 min as tolerated.  Pain in both lower extremities  Chronic. ? Radicular. ?RLS,OA Gabapentin did not help. After discussion of some side effects and possible benefits she agrees with trying Cymbalta. She will start Cymbalta 30 mg daily.  -     DULoxetine (CYMBALTA) 30 MG capsule; Take 1 capsule (30 mg total) by mouth daily.   Hypertension, essential, benign Not well controlled. Possible complications of elevated BP discussed. Resume Lisinopril 20 mg. Annual eye  examination. F/U in 6 weeks.   Hyperlipidemia Resume Pravastatin 20 mg daily. Low fat diet also recommended. Further recommendations will be given according to lab results. F/U in 6-12 months.   Vertigo We discussed other possible etiologies of dizziness, Hx and examination today suggest benign vertigo. No medication recommended for now. Fall prevention. Instructed about warning signs. Carotid US will be arranged.          Aireana Ryland G. Martinique, MD  [facility]. [place] office.

## 2017-09-04 NOTE — Patient Instructions (Signed)
A few things to remember from today's visit:   Carotid bruit, unspecified laterality - Plan: VAS US CAROTID  Hypertension, essential, benign - Plan: Comprehensive metabolic panel, losartan (COZAAR) 50 MG tablet  Hyperlipidemia, unspecified hyperlipidemia type - Plan: Lipid panel, Comprehensive metabolic panel, pravastatin (PRAVACHOL) 20 MG tablet  Morbid obesity with BMI of 40.0-44.9, adult ([facility])  Vertigo  Pain in both lower extremities - Plan: DULoxetine (CYMBALTA) 30 MG capsule  Pure hypercholesterolemia - Plan: pravastatin (PRAVACHOL) 20 MG tablet  Essential hypertension  Resume Losartan and Pravastatin. Cymbalta 30 mg added today to see if it helps with leg pain.   Please be sure medication list is accurate. If a new problem present, please set up appointment sooner than planned today.

## 2017-09-04 NOTE — Assessment & Plan Note (Signed)
Not well controlled. Possible complications of elevated BP discussed. Resume Lisinopril 20 mg. Annual eye examination. F/U in 6 weeks.

## 2017-09-06 ENCOUNTER — Encounter (HOSPITAL_COMMUNITY): Payer: BLUE CROSS/BLUE SHIELD

## 2017-09-11 ENCOUNTER — Encounter: Payer: Self-pay | Admitting: Family Medicine

## 2017-09-11 ENCOUNTER — Encounter (HOSPITAL_COMMUNITY): Payer: BLUE CROSS/BLUE SHIELD

## 2017-09-19 ENCOUNTER — Ambulatory Visit (HOSPITAL_COMMUNITY)
Admission: RE | Admit: 2017-09-19 | Discharge: 2017-09-19 | Disposition: A | Discharge status: Home / Self Care | Payer: BLUE CROSS/BLUE SHIELD | Source: Ambulatory Visit | Attending: Cardiology | Admitting: Cardiology

## 2017-09-19 DIAGNOSIS — R0989 Other specified symptoms and signs involving the circulatory and respiratory systems: Secondary | ICD-10-CM

## 2017-09-24 ENCOUNTER — Encounter: Payer: Self-pay | Admitting: *Deleted

## 2017-10-02 ENCOUNTER — Other Ambulatory Visit: Payer: Self-pay

## 2017-10-02 ENCOUNTER — Emergency Department (HOSPITAL_COMMUNITY)
Admission: EM | Admit: 2017-10-02 | Discharge: 2017-10-02 | Disposition: A | Discharge status: Home / Self Care | Payer: BLUE CROSS/BLUE SHIELD | Attending: Emergency Medicine | Admitting: Emergency Medicine

## 2017-10-02 ENCOUNTER — Encounter (HOSPITAL_COMMUNITY): Payer: Self-pay | Admitting: *Deleted

## 2017-10-02 DIAGNOSIS — I10 Essential (primary) hypertension: Secondary | ICD-10-CM | POA: Diagnosis not present

## 2017-10-02 DIAGNOSIS — Z7982 Long term (current) use of aspirin: Secondary | ICD-10-CM | POA: Insufficient documentation

## 2017-10-02 DIAGNOSIS — Z79899 Other long term (current) drug therapy: Secondary | ICD-10-CM | POA: Insufficient documentation

## 2017-10-02 DIAGNOSIS — Z87891 Personal history of nicotine dependence: Secondary | ICD-10-CM | POA: Insufficient documentation

## 2017-10-02 DIAGNOSIS — L509 Urticaria, unspecified: Secondary | ICD-10-CM

## 2017-10-02 MED ORDER — PREDNISONE 20 MG PO TABS
60.0000 mg | ORAL_TABLET | Freq: Once | ORAL | Status: AC
  Administered 2017-10-02: 60 mg via ORAL
  Filled 2017-10-02: qty 3

## 2017-10-02 MED ORDER — PREDNISONE 10 MG (21) PO TBPK: ORAL_TABLET | Freq: Every day | ORAL | 0 refills | Status: DC

## 2017-10-02 MED ORDER — RANITIDINE HCL 150 MG PO CAPS: 150.0000 mg | ORAL_CAPSULE | Freq: Every day | ORAL | 0 refills | Status: DC

## 2017-10-02 MED ORDER — RANITIDINE HCL 150 MG/10ML PO SYRP
150.0000 mg | ORAL_SOLUTION | Freq: Once | ORAL | Status: AC
  Administered 2017-10-02: 150 mg via ORAL
  Filled 2017-10-02: qty 10

## 2017-10-02 MED ORDER — DIPHENHYDRAMINE HCL 50 MG/ML IJ SOLN: 25.0000 mg | Freq: Once | INTRAMUSCULAR | Status: DC

## 2017-10-02 MED ORDER — FAMOTIDINE 20 MG PO TABS: 20.0000 mg | ORAL_TABLET | Freq: Once | ORAL | Status: DC

## 2017-10-02 MED ORDER — LORATADINE 10 MG PO TABS: 10.0000 mg | ORAL_TABLET | Freq: Every day | ORAL | 0 refills | Status: DC

## 2017-10-02 MED ORDER — DIPHENHYDRAMINE HCL 25 MG PO CAPS
50.0000 mg | ORAL_CAPSULE | Freq: Once | ORAL | Status: AC
  Administered 2017-10-02: 50 mg via ORAL
  Filled 2017-10-02: qty 2

## 2017-10-02 NOTE — ED Triage Notes (Signed)
The pt is c/o itching all over her body since last night with hives   No difficulty breathing  The palms of her hands are itching with swollen both hands

## 2017-10-02 NOTE — ED Provider Notes (Signed)
Patient placed in Quick Look pathway, seen and evaluated   Chief Complaint: hives  HPI:   62 y.o. female here with rash and itching all over. The patient ate tuna last night and the rash started after that. Patient woke about 3 am with the rash and itching and has gotten worse today. Patient denies shortness of breath or difficulty swallowing. She c/o swelling of her hands.  ROS: Skin: hives  Physical Exam:   Gen: No distress  Neuro: Awake and Alert  Skin: generalized hives  Resp: lungs clear  Focused Exam:    Initiation of care has begun. The patient has been counseled on the process, plan, and necessity for staying for the completion/evaluation, and the remainder of the medical screening examination     Ashley Murrain, NP 10/02/17 1859    Dorie Rank, MD 10/03/17 1736

## 2017-10-02 NOTE — ED Provider Notes (Signed)
[facility] EMERGENCY DEPARTMENT Provider Note   CSN: [id number] Arrival date & time: 10/02/17  1704     History   Chief Complaint Chief Complaint  Patient presents with  . Urticaria    HPI Jennifer Davila is a 62 y.o. female.  HPI   62 year old female presents today with complaints of rash and itching.  Patient notes that last night she had a minor rash on her bilateral dorsum hands.  She notes waking up this morning with significantly worse swelling pain and itchiness.  She notes the spread to the remainder of her body with small blotches throughout.  Patient denies any intraoral involvement, denies any shortness of breath or abdominal pain.  She denies any history of the same.  No medications prior to arrival.  Patient denies any changes in soaps lotions or body care products, denies any new food exposure.  Patient does note recently starting a new medication for leg pain, she is uncertain of the name but reports it starts with the d (chart review shows Cymbalta) she notes taking this over the last several days.  Past Medical History:  Diagnosis Date  . Arthritis   . Constipation    on stool softener  . GERD (gastroesophageal reflux disease)   . Hyperlipidemia   . Hypertension   . Trouble swallowing 2013   following lap band    Patient Active Problem List   Diagnosis Date Noted  . Hypertension, essential, benign 09/04/2017  . Hyperlipidemia 09/04/2017  . Morbid obesity with BMI of 40.0-44.9, adult ([place]) 09/04/2017  . Vertigo 09/04/2017  . Hip pain 05/25/2015    Past Surgical History:  Procedure Laterality Date  . ABDOMINAL HYSTERECTOMY    . CHOLECYSTECTOMY    . LAPAROSCOPIC GASTRIC BANDING    . LAPAROSCOPIC REPAIR AND REMOVAL OF GASTRIC BAND  2013-2014    OB History    No data available       Home Medications    Prior to Admission medications   Medication Sig Start Date End Date Taking? Authorizing Provider  aspirin 81 MG  tablet Take 81 mg by mouth every morning.     [provider]  docusate sodium (COLACE) 100 MG capsule Take 1 capsule (100 mg total) by mouth every 12 (twelve) hours. 03/31/16   Antonietta Breach, PA-C  DULoxetine (CYMBALTA) 30 MG capsule Take 1 capsule (30 mg total) by mouth daily. 09/04/17   Martinique, [name], MD  esomeprazole (NEXIUM) 40 MG capsule Take 1 capsule (40 mg total) by mouth daily. 05/03/16   Micheline Chapman, NP  famotidine (PEPCID) 20 MG tablet Take 1 tablet (20 mg total) by mouth 2 (two) times daily as needed for heartburn or indigestion. Patient not taking: Reported on 10/10/2016 05/23/15   Micheline Chapman, NP  ibuprofen (ADVIL,MOTRIN) 600 MG tablet Take 1 tablet (600 mg total) by mouth every 6 (six) hours as needed. 08/25/16   Emeline General, PA-C  loratadine (CLARITIN) 10 MG tablet Take 1 tablet (10 mg total) by mouth daily. 10/02/17   Lennis Rader, Dellis Filbert, PA-C  losartan (COZAAR) 50 MG tablet Take 1 tablet (50 mg total) by mouth daily. 09/04/17   Martinique, [name], MD  polyethylene glycol powder (GLYCOLAX/MIRALAX) powder Take 17 g by mouth 2 (two) times daily. Until daily soft stools  OTC Patient not taking: Reported on 09/04/2017 03/31/16   Antonietta Breach, PA-C  pravastatin (PRAVACHOL) 20 MG tablet Take 1 tablet (20 mg total) by mouth daily. 09/04/17  Martinique, [name], MD  predniSONE (STERAPRED UNI-PAK 21 TAB) 10 MG (21) TBPK tablet Take by mouth daily. Take 4 tabs by mouth daily  for 3 days, then 3 tabs for 2 days, then 2 tabs for 2 days, then 1 tabs for 2 days, 10/02/17   Eugena Rhue, Dellis Filbert, PA-C  ranitidine (ZANTAC) 150 MG capsule Take 1 capsule (150 mg total) by mouth daily. 10/02/17   Okey Regal, PA-C    Family History Family History  Problem Relation Age of Onset  . Hypertension Mother   . Stroke Mother   . Diabetes Father   . Alcohol abuse Father   . Kidney disease Father   . Colon polyps Brother   . Cancer Brother   . Alcohol abuse Brother     Social History Social  History   Tobacco Use  . Smoking status: Former Smoker    Types: Cigarettes    Last attempt to quit: 10/02/1972    Years since quitting: 45.0  . Smokeless tobacco: Never Used  Substance Use Topics  . Alcohol use: No  . Drug use: No     Allergies   Patient has no known allergies.   Review of Systems Review of Systems  All other systems reviewed and are negative.    Physical Exam Updated Vital Signs BP (!) 186/88   Pulse 74   Temp 99.1 F (37.3 C) (Oral)   Resp 18   Ht 5\' 2"  (1.575 m)   Wt 108.9 kg (240 lb)   SpO2 100%   BMI 43.90 kg/m   Physical Exam  Constitutional: She is oriented to person, place, and time. She appears well-developed and well-nourished.  HENT:  Head: Normocephalic and atraumatic.  No intraoral swelling or facial swelling  Eyes: Conjunctivae are normal. Pupils are equal, round, and reactive to light. Right eye exhibits no discharge. Left eye exhibits no discharge. No scleral icterus.  Neck: Normal range of motion. No JVD present. No tracheal deviation present.  Pulmonary/Chest: Effort normal and breath sounds normal. No stridor. No respiratory distress. She has no wheezes. She has no rales. She exhibits no tenderness.  Neurological: She is alert and oriented to person, place, and time. Coordination normal.  Skin:  Urticaria noted to the upper extremities with majority over the dorsum of the hands no significant edema  Psychiatric: She has a normal mood and affect. Her behavior is normal. Judgment and thought content normal.  Nursing note and vitals reviewed.    ED Treatments / Results  Labs (all labs ordered are listed, but only abnormal results are displayed) Labs Reviewed - No data to display  EKG  EKG Interpretation None       Radiology No results found.  Procedures Procedures (including critical care time)  Medications Ordered in ED Medications  predniSONE (DELTASONE) tablet 60 mg (60 mg Oral Given 10/02/17 1903)    diphenhydrAMINE (BENADRYL) capsule 50 mg (50 mg Oral Given 10/02/17 1903)  ranitidine (ZANTAC) 150 MG/10ML syrup 150 mg (150 mg Oral Given 10/02/17 1903)     Initial Impression / Assessment and Plan / ED Course  I have reviewed the triage vital signs and the nursing notes.  Pertinent labs & imaging results that were available during my care of the patient were reviewed by me and considered in my medical decision making (see chart for details).     Final Clinical Impressions(s) / ED Diagnoses   Final diagnoses:  Urticaria    Labs:   Imaging:  Consults:  Therapeutics:  Benadryl, prednisone, Zantac  Discharge Meds: Claritin, Zantac, prednisone  Assessment/Plan: 62 year old female presents today with urticaria.  Uncertain etiology although patient did recently start Cymbalta.  She is encouraged to discontinue this medication until symptoms resolved and further discussion with prescribing provider.  Patient without any new exposures.  No angioedema, no significant involvement.  Symptoms improved with above medication.  She will be discharged home with Claritin, Zantac, prednisone, she is given strict return precautions, she verbalized understanding and agreement to today's plan had no further questions or concerns at time of discharge.    ED Discharge Orders        Ordered    ranitidine (ZANTAC) 150 MG capsule  Daily     10/02/17 2106    loratadine (CLARITIN) 10 MG tablet  Daily     10/02/17 2106    predniSONE (STERAPRED UNI-PAK 21 TAB) 10 MG (21) TBPK tablet  Daily     10/02/17 2112       Okey Regal, PA-C 10/02/17 2213    Dorie Rank, MD 10/03/17 [phone]

## 2017-10-02 NOTE — ED Notes (Signed)
The pts hives are getting better  Her hands are no longer red and swollen  The hives on both sides of her neck are receding

## 2017-10-02 NOTE — ED Notes (Signed)
Po orders for benadryl and zantac given by jeff pa

## 2017-10-02 NOTE — Discharge Instructions (Signed)
Please read attached information. If you experience any new or worsening signs or symptoms please return to the emergency room for evaluation. Please follow-up with your primary care provider or specialist as discussed. Please use medication prescribed only as directed and discontinue taking if you have any concerning signs or symptoms.   °

## 2017-10-08 ENCOUNTER — Inpatient Hospital Stay: Payer: BLUE CROSS/BLUE SHIELD | Admitting: Family Medicine

## 2017-10-17 NOTE — Progress Notes (Signed)
HPI:   [name] is a 62 y.o. female, who is here today to follow on recent OV.   She was seen on 09/04/17, when Cymbalta 30 mg daily was started to treat LE pain/cramps. She tried Gabapentin, which was recommended by orthopedist but she did not feel like it helped. She does not feel like Cymbalta 30 mg has helped at all. She states that pain on posterior aspect of left thigh "shock"-like pain, exacerbated by standing up or straightening up her back. It is intermittently and last a few minutes.  Left lower extremity cramps are usually at night, "so bad." She has not identified exacerbating or alleviating factors for the cramps.  She follows with orthopedist last in 2019.   According to patient, ibuprofen 800 mg daily was the "only" medication that helped her during the day.  Hypertension: Last visit Lisinopril 20 mg was resumed. She is not checking BP at home. Denies severe/frequent headache, visual changes, chest pain, dyspnea, palpitation, focal weakness, or edema.  Lab Results  Component Value Date   CREATININE 0.93 09/04/2017   BUN 17 09/04/2017   NA 141 09/04/2017   K 3.4 (L) 09/04/2017   CL 102 09/04/2017   CO2 32 09/04/2017   Glucose 221 in 03/2016, no Hx of DM.   She was recently in the ER evaluated because urticaria, problem resolved.   She is also complaining of neck pain for the past few days, she thinks she might have slept wrong. Pain is not radiated, mild, exacerbated by movement and alleviated by rest. She has not taking OTC medication. She denies prior history.  Reporting vitamin D deficiency, she is not taking vitamin D supplementation.  Review of Systems  Constitutional: Negative for activity change, appetite change, fatigue and fever.  HENT: Negative for mouth sores, nosebleeds and trouble swallowing.   Eyes: Negative for redness and visual disturbance.  Respiratory: Negative for cough, shortness of breath and wheezing.     Cardiovascular: Negative for chest pain, palpitations and leg swelling.  Gastrointestinal: Negative for abdominal pain, nausea and vomiting.       Negative for changes in bowel habits.  Genitourinary: Negative for decreased urine volume, difficulty urinating, dysuria and hematuria.  Musculoskeletal: Positive for myalgias and neck pain. Negative for gait problem.  Neurological: Negative for syncope, weakness, numbness and headaches.  Psychiatric/Behavioral: Positive for sleep disturbance. Negative for confusion. The patient is nervous/anxious.       Current Outpatient Medications on File Prior to Visit  Medication Sig Dispense Refill  . aspirin 81 MG tablet Take 81 mg by mouth every morning.     Marland Kitchen losartan (COZAAR) 50 MG tablet Take 1 tablet (50 mg total) by mouth daily. 90 tablet 1  . pravastatin (PRAVACHOL) 20 MG tablet Take 1 tablet (20 mg total) by mouth daily. 90 tablet 1   Current Facility-Administered Medications on File Prior to Visit  Medication Dose Route Frequency Provider Last Rate Last Dose  . 0.9 %  sodium chloride infusion  500 mL Intravenous Continuous Armbruster, Carlota Raspberry, MD         Past Medical History:  Diagnosis Date  . Arthritis   . Constipation    on stool softener  . GERD (gastroesophageal reflux disease)   . Hyperlipidemia   . Hypertension   . Trouble swallowing 2013   following lap band   No Known Allergies  Social History   Socioeconomic History  . Marital status: Married    Spouse  name: Not on file  . Number of children: 1  . Years of education: Not on file  . Highest education level: Not on file  Occupational History  . Not on file  Social Needs  . Financial resource strain: Not on file  . Food insecurity:    Worry: Not on file    Inability: Not on file  . Transportation needs:    Medical: Not on file    Non-medical: Not on file  Tobacco Use  . Smoking status: Former Smoker    Types: Cigarettes    Last attempt to quit: 10/02/1972     Years since quitting: 45.0  . Smokeless tobacco: Never Used  Substance and Sexual Activity  . Alcohol use: No  . Drug use: No  . Sexual activity: Never  Lifestyle  . Physical activity:    Days per week: Not on file    Minutes per session: Not on file  . Stress: Not on file  Relationships  . Social connections:    Talks on phone: Not on file    Gets together: Not on file    Attends religious service: Not on file    Active member of club or organization: Not on file    Attends meetings of clubs or organizations: Not on file    Relationship status: Not on file  Other Topics Concern  . Not on file  Social History Narrative  . Not on file    Vitals:   10/18/17 0816  BP: 133/84  Pulse: 83  Resp: 12  Temp: 98.3 F (36.8 C)  SpO2: 99%   Body mass index is 43.58 kg/m.     Physical Exam  Nursing note and vitals reviewed. Constitutional: She is oriented to person, place, and time. She appears well-developed. No distress.  HENT:  Head: Normocephalic and atraumatic.  Mouth/Throat: Oropharynx is clear and moist and mucous membranes are normal.  Eyes: Pupils are equal, round, and reactive to light. Conjunctivae are normal.  Cardiovascular: Normal rate and regular rhythm.  No murmur heard. Pulses:      Dorsalis pedis pulses are 2+ on the right side, and 2+ on the left side.  Respiratory: Effort normal and breath sounds normal. No respiratory distress.  GI: Soft. She exhibits no mass. There is no hepatomegaly. There is no tenderness.  Musculoskeletal: She exhibits no edema.       Thoracic back: She exhibits no tenderness and no bony tenderness.       Lumbar back: She exhibits no tenderness and no bony tenderness.       Right lower leg: She exhibits no tenderness.       Left lower leg: She exhibits no tenderness.  Lymphadenopathy:    She has no cervical adenopathy.  Neurological: She is alert and oriented to person, place, and time. She has normal strength. Coordination  normal.  Skin: Skin is warm. No erythema.  Psychiatric: She has a normal mood and affect.  Well groomed, good eye contact.    ASSESSMENT AND PLAN:  Jennifer Davila was seen today for follow-up.    Orders Placed This Encounter  Procedures  . Potassium  . VITAMIN D 25 Hydroxy (Vit-D Deficiency, Fractures)  . Hemoglobin A1c   Lab Results  Component Value Date   HGBA1C 6.1 10/18/2017     Hypertension, essential, benign Adequately controlled. No changes in current management. DASH-low salt diet recommended. Eye exam recommended annually. Monitor BP at home. F/U in 6 months, before if needed.  Morbid obesity with BMI of 40.0-44.9, adult ([place]) We discussed benefits of wt loss as well as adverse effects of obesity. Consistency with healthy diet and physical activity recommended. Walking in place or daily brisk walking for 15-30 min as tolerated.   Leg cramping Stretching exercises for hamstrings recommended. Zanaflex 2-4 mg at bedtime may help. Naproxen 500 mg daily as needed, side effects discussed.   Vitamin D deficiency, unspecified For now OTC Vit D [phone] U daily. Further recommendations will be given according to lab results.     GERD (gastroesophageal reflux disease) Resume Nexium 40 mg daily. GERD precautions. Wt loss may help.    Hypokalemia  K+ containing diet recommended for now. Further recommendation will be given according to culture results.  -     Potassium    Hyperglycemia  Recommend a healthy lifestyle for primary prevention. Further recommendations will be given according to hemoglobin A1c.  -     Hemoglobin A1c     Brennin Durfee G. [place], MD  [facility]. [place] office.

## 2017-10-18 ENCOUNTER — Ambulatory Visit: Payer: BLUE CROSS/BLUE SHIELD | Admitting: Family Medicine

## 2017-10-18 ENCOUNTER — Encounter: Payer: Self-pay | Admitting: Family Medicine

## 2017-10-18 VITALS — BP 133/84 | HR 83 | Temp 98.3°F | Resp 12 | Ht 62.0 in | Wt 238.2 lb

## 2017-10-18 DIAGNOSIS — E876 Hypokalemia: Secondary | ICD-10-CM

## 2017-10-18 DIAGNOSIS — R739 Hyperglycemia, unspecified: Secondary | ICD-10-CM

## 2017-10-18 DIAGNOSIS — E559 Vitamin D deficiency, unspecified: Secondary | ICD-10-CM

## 2017-10-18 DIAGNOSIS — R252 Cramp and spasm: Secondary | ICD-10-CM | POA: Diagnosis not present

## 2017-10-18 DIAGNOSIS — K219 Gastro-esophageal reflux disease without esophagitis: Secondary | ICD-10-CM | POA: Diagnosis not present

## 2017-10-18 DIAGNOSIS — Z6841 Body Mass Index (BMI) 40.0 and over, adult: Secondary | ICD-10-CM

## 2017-10-18 DIAGNOSIS — I10 Essential (primary) hypertension: Secondary | ICD-10-CM | POA: Diagnosis not present

## 2017-10-18 LAB — HEMOGLOBIN A1C: Hgb A1c MFr Bld: 6.1 % (ref 4.6–6.5)

## 2017-10-18 LAB — POTASSIUM: Potassium: 4.2 mEq/L (ref 3.5–5.1)

## 2017-10-18 LAB — VITAMIN D 25 HYDROXY (VIT D DEFICIENCY, FRACTURES): VITD: 6.59 ng/mL — ABNORMAL LOW (ref 30.00–100.00)

## 2017-10-18 MED ORDER — ESOMEPRAZOLE MAGNESIUM 40 MG PO CPDR: 40.0000 mg | DELAYED_RELEASE_CAPSULE | Freq: Every day | ORAL | 3 refills | Status: DC

## 2017-10-18 MED ORDER — TIZANIDINE HCL 4 MG PO TABS: 2.0000 mg | ORAL_TABLET | Freq: Every day | ORAL | 1 refills | Status: DC

## 2017-10-18 MED ORDER — NAPROXEN 500 MG PO TABS: 500.0000 mg | ORAL_TABLET | Freq: Every day | ORAL | 2 refills | Status: DC | PRN

## 2017-10-18 NOTE — Assessment & Plan Note (Addendum)
Stretching exercises for hamstrings recommended. Zanaflex 2-4 mg at bedtime may help. Naproxen 500 mg daily as needed, side effects discussed.

## 2017-10-18 NOTE — Assessment & Plan Note (Signed)
Adequately controlled. No changes in current management. DASH-low salt diet recommended. Eye exam recommended annually. Monitor BP at home. F/U in 6 months, before if needed.

## 2017-10-18 NOTE — Patient Instructions (Addendum)
A few things to remember from today's visit:   Hypertension, essential, benign  Morbid obesity with BMI of 40.0-44.9, adult ([facility])  Leg cramping - Plan: naproxen (NAPROSYN) 500 MG tablet, tiZANidine (ZANAFLEX) 4 MG tablet  Hypokalemia - Plan: Potassium  Vitamin D deficiency, unspecified - Plan: VITAMIN D 25 Hydroxy (Vit-D Deficiency, Fractures)  Gastroesophageal reflux disease, esophagitis presence not specified - Plan: esomeprazole (NEXIUM) 40 MG capsule  Hyperglycemia - Plan: Hemoglobin A1c  Cymbalta discontinued.   DASH Eating Plan DASH stands for "Dietary Approaches to Stop Hypertension." The DASH eating plan is a healthy eating plan that has been shown to reduce high blood pressure (hypertension). It may also reduce your risk for type 2 diabetes, heart disease, and stroke. The DASH eating plan may also help with weight loss. What are tips for following this plan? General guidelines  Avoid eating more than 2,300 mg (milligrams) of salt (sodium) a day. If you have hypertension, you may need to reduce your sodium intake to 1,500 mg a day.  Limit alcohol intake to no more than 1 drink a day for nonpregnant women and 2 drinks a day for men. One drink equals 12 oz of beer, 5 oz of wine, or 1 oz of hard liquor.  Work with your health care provider to maintain a healthy body weight or to lose weight. Ask what an ideal weight is for you.  Get at least 30 minutes of exercise that causes your heart to beat faster (aerobic exercise) most days of the week. Activities may include walking, swimming, or biking.  Work with your health care provider or diet and nutrition specialist (dietitian) to adjust your eating plan to your individual calorie needs. Reading food labels  Check food labels for the amount of sodium per serving. Choose foods with less than 5 percent of the Daily Value of sodium. Generally, foods with less than 300 mg of sodium per serving fit into this eating plan.  To find  whole grains, look for the word "whole" as the first word in the ingredient list. Shopping  Buy products labeled as "low-sodium" or "no salt added."  Buy fresh foods. Avoid canned foods and premade or frozen meals. Cooking  Avoid adding salt when cooking. Use salt-free seasonings or herbs instead of table salt or sea salt. Check with your health care provider or pharmacist before using salt substitutes.  Do not fry foods. Cook foods using healthy methods such as baking, boiling, grilling, and broiling instead.  Cook with heart-healthy oils, such as olive, canola, soybean, or sunflower oil. Meal planning   Eat a balanced diet that includes: ? 5 or more servings of fruits and vegetables each day. At each meal, try to fill half of your plate with fruits and vegetables. ? Up to 6-8 servings of whole grains each day. ? Less than 6 oz of lean meat, poultry, or fish each day. A 3-oz serving of meat is about the same size as a deck of cards. One egg equals 1 oz. ? 2 servings of low-fat dairy each day. ? A serving of nuts, seeds, or beans 5 times each week. ? Heart-healthy fats. Healthy fats called Omega-3 fatty acids are found in foods such as flaxseeds and coldwater fish, like sardines, salmon, and mackerel.  Limit how much you eat of the following: ? Canned or prepackaged foods. ? Food that is high in trans fat, such as fried foods. ? Food that is high in saturated fat, such as fatty meat. ? Sweets,  desserts, sugary drinks, and other foods with added sugar. ? Full-fat dairy products.  Do not salt foods before eating.  Try to eat at least 2 vegetarian meals each week.  Eat more home-cooked food and less restaurant, buffet, and fast food.  When eating at a restaurant, ask that your food be prepared with less salt or no salt, if possible. What foods are recommended? The items listed may not be a complete list. Talk with your dietitian about what dietary choices are best for  you. Grains Whole-grain or whole-wheat bread. Whole-grain or whole-wheat pasta. Behar rice. [place]. Bulgur. Whole-grain and low-sodium cereals. Pita bread. Low-fat, low-sodium crackers. Whole-wheat flour tortillas. Vegetables Fresh or frozen vegetables (raw, steamed, roasted, or grilled). Low-sodium or reduced-sodium tomato and vegetable juice. Low-sodium or reduced-sodium tomato sauce and tomato paste. Low-sodium or reduced-sodium canned vegetables. Fruits All fresh, dried, or frozen fruit. Canned fruit in natural juice (without added sugar). Meat and other protein foods Skinless chicken or [place]. Ground chicken or [place]. Pork with fat trimmed off. Fish and seafood. Egg whites. Dried beans, peas, or lentils. Unsalted nuts, nut butters, and seeds. Unsalted canned beans. Lean cuts of beef with fat trimmed off. Low-sodium, lean deli meat. Dairy Low-fat (1%) or fat-free (skim) milk. Fat-free, low-fat, or reduced-fat cheeses. Nonfat, low-sodium ricotta or cottage cheese. Low-fat or nonfat yogurt. Low-fat, low-sodium cheese. Fats and oils Soft margarine without trans fats. Vegetable oil. Low-fat, reduced-fat, or light mayonnaise and salad dressings (reduced-sodium). Canola, safflower, olive, soybean, and sunflower oils. Avocado. Seasoning and other foods Herbs. Spices. Seasoning mixes without salt. Unsalted popcorn and pretzels. Fat-free sweets. What foods are not recommended? The items listed may not be a complete list. Talk with your dietitian about what dietary choices are best for you. Grains Baked goods made with fat, such as croissants, muffins, or some breads. Dry pasta or rice meal packs. Vegetables Creamed or fried vegetables. Vegetables in a cheese sauce. Regular canned vegetables (not low-sodium or reduced-sodium). Regular canned tomato sauce and paste (not low-sodium or reduced-sodium). Regular tomato and vegetable juice (not low-sodium or reduced-sodium). Angie Fava.  Olives. Fruits Canned fruit in a light or heavy syrup. Fried fruit. Fruit in cream or butter sauce. Meat and other protein foods Fatty cuts of meat. Ribs. Fried meat. Berniece Salines. Sausage. Bologna and other processed lunch meats. Salami. Fatback. Hotdogs. Bratwurst. Salted nuts and seeds. Canned beans with added salt. Canned or smoked fish. Whole eggs or egg yolks. Chicken or [place] with skin. Dairy Whole or 2% milk, cream, and half-and-half. Whole or full-fat cream cheese. Whole-fat or sweetened yogurt. Full-fat cheese. Nondairy creamers. Whipped toppings. Processed cheese and cheese spreads. Fats and oils Butter. Stick margarine. Lard. Shortening. Ghee. Bacon fat. Tropical oils, such as coconut, palm kernel, or palm oil. Seasoning and other foods Salted popcorn and pretzels. Onion salt, garlic salt, seasoned salt, table salt, and sea salt. Worcestershire sauce. Tartar sauce. Barbecue sauce. Teriyaki sauce. Soy sauce, including reduced-sodium. Steak sauce. Canned and packaged gravies. Fish sauce. Oyster sauce. Cocktail sauce. Horseradish that you find on the shelf. Ketchup. Mustard. Meat flavorings and tenderizers. Bouillon cubes. Hot sauce and Tabasco sauce. Premade or packaged marinades. Premade or packaged taco seasonings. Relishes. Regular salad dressings. Where to find more information:  National Heart, Lung, and [place]: https://wilson-eaton.com/  American Heart Association: www.heart.org Summary  The DASH eating plan is a healthy eating plan that has been shown to reduce high blood pressure (hypertension). It may also reduce your risk for type 2 diabetes, heart  disease, and stroke.  With the DASH eating plan, you should limit salt (sodium) intake to 2,300 mg a day. If you have hypertension, you may need to reduce your sodium intake to 1,500 mg a day.  When on the DASH eating plan, aim to eat more fresh fruits and vegetables, whole grains, lean proteins, low-fat dairy, and heart-healthy  fats.  Work with your health care provider or diet and nutrition specialist (dietitian) to adjust your eating plan to your individual calorie needs. This information is not intended to replace advice given to you by your health care provider. Make sure you discuss any questions you have with your health care provider. Document Released: 07/05/2011 Document Revised: 07/09/2016 Document Reviewed: 07/09/2016 Elsevier Interactive Patient Education  [employer].     Please be sure medication list is accurate. If a new problem present, please set up appointment sooner than planned today.

## 2017-10-18 NOTE — Assessment & Plan Note (Signed)
We discussed benefits of wt loss as well as adverse effects of obesity. Consistency with healthy diet and physical activity recommended. Walking in place or daily brisk walking for 15-30 min as tolerated.

## 2017-10-18 NOTE — Assessment & Plan Note (Signed)
Resume Nexium 40 mg daily. GERD precautions. Wt loss may help.

## 2017-10-18 NOTE — Assessment & Plan Note (Signed)
For now OTC Vit D [phone] U daily. Further recommendations will be given according to lab results.

## 2017-10-22 ENCOUNTER — Encounter: Payer: Self-pay | Admitting: *Deleted

## 2017-11-15 ENCOUNTER — Other Ambulatory Visit: Payer: Self-pay | Admitting: Family Medicine

## 2018-01-06 ENCOUNTER — Telehealth: Payer: Self-pay | Admitting: Gastroenterology

## 2018-01-06 ENCOUNTER — Other Ambulatory Visit: Payer: Self-pay

## 2018-01-06 ENCOUNTER — Encounter: Payer: Self-pay | Admitting: Family Medicine

## 2018-01-06 ENCOUNTER — Emergency Department (HOSPITAL_COMMUNITY): Payer: BLUE CROSS/BLUE SHIELD

## 2018-01-06 ENCOUNTER — Inpatient Hospital Stay (HOSPITAL_COMMUNITY)
Admission: EM | Admit: 2018-01-06 | Discharge: 2018-01-14 | DRG: 378 | Disposition: A | Discharge status: Home / Self Care | Payer: BLUE CROSS/BLUE SHIELD | Attending: Internal Medicine | Admitting: Internal Medicine

## 2018-01-06 ENCOUNTER — Ambulatory Visit: Payer: BLUE CROSS/BLUE SHIELD | Admitting: Family Medicine

## 2018-01-06 ENCOUNTER — Encounter (HOSPITAL_COMMUNITY): Payer: Self-pay

## 2018-01-06 VITALS — BP 140/88 | HR 82 | Temp 98.5°F | Resp 12 | Ht 62.0 in | Wt 241.4 lb

## 2018-01-06 DIAGNOSIS — K5730 Diverticulosis of large intestine without perforation or abscess without bleeding: Secondary | ICD-10-CM | POA: Diagnosis not present

## 2018-01-06 DIAGNOSIS — D638 Anemia in other chronic diseases classified elsewhere: Secondary | ICD-10-CM | POA: Diagnosis present

## 2018-01-06 DIAGNOSIS — E669 Obesity, unspecified: Secondary | ICD-10-CM

## 2018-01-06 DIAGNOSIS — K5900 Constipation, unspecified: Secondary | ICD-10-CM

## 2018-01-06 DIAGNOSIS — Z8249 Family history of ischemic heart disease and other diseases of the circulatory system: Secondary | ICD-10-CM

## 2018-01-06 DIAGNOSIS — K648 Other hemorrhoids: Secondary | ICD-10-CM | POA: Diagnosis not present

## 2018-01-06 DIAGNOSIS — M79604 Pain in right leg: Secondary | ICD-10-CM | POA: Diagnosis not present

## 2018-01-06 DIAGNOSIS — M79605 Pain in left leg: Secondary | ICD-10-CM | POA: Diagnosis present

## 2018-01-06 DIAGNOSIS — D62 Acute posthemorrhagic anemia: Secondary | ICD-10-CM | POA: Diagnosis not present

## 2018-01-06 DIAGNOSIS — Z6841 Body Mass Index (BMI) 40.0 and over, adult: Secondary | ICD-10-CM

## 2018-01-06 DIAGNOSIS — Z79899 Other long term (current) drug therapy: Secondary | ICD-10-CM

## 2018-01-06 DIAGNOSIS — K5731 Diverticulosis of large intestine without perforation or abscess with bleeding: Secondary | ICD-10-CM | POA: Diagnosis not present

## 2018-01-06 DIAGNOSIS — Z9049 Acquired absence of other specified parts of digestive tract: Secondary | ICD-10-CM

## 2018-01-06 DIAGNOSIS — M1990 Unspecified osteoarthritis, unspecified site: Secondary | ICD-10-CM | POA: Diagnosis present

## 2018-01-06 DIAGNOSIS — I10 Essential (primary) hypertension: Secondary | ICD-10-CM | POA: Diagnosis present

## 2018-01-06 DIAGNOSIS — E559 Vitamin D deficiency, unspecified: Secondary | ICD-10-CM

## 2018-01-06 DIAGNOSIS — Z9884 Bariatric surgery status: Secondary | ICD-10-CM

## 2018-01-06 DIAGNOSIS — M25559 Pain in unspecified hip: Secondary | ICD-10-CM | POA: Diagnosis present

## 2018-01-06 DIAGNOSIS — Z90710 Acquired absence of both cervix and uterus: Secondary | ICD-10-CM

## 2018-01-06 DIAGNOSIS — E876 Hypokalemia: Secondary | ICD-10-CM

## 2018-01-06 DIAGNOSIS — E785 Hyperlipidemia, unspecified: Secondary | ICD-10-CM | POA: Diagnosis present

## 2018-01-06 DIAGNOSIS — K922 Gastrointestinal hemorrhage, unspecified: Secondary | ICD-10-CM | POA: Diagnosis not present

## 2018-01-06 DIAGNOSIS — M79606 Pain in leg, unspecified: Secondary | ICD-10-CM | POA: Insufficient documentation

## 2018-01-06 DIAGNOSIS — K625 Hemorrhage of anus and rectum: Secondary | ICD-10-CM

## 2018-01-06 DIAGNOSIS — Z87891 Personal history of nicotine dependence: Secondary | ICD-10-CM

## 2018-01-06 DIAGNOSIS — Z8371 Family history of colonic polyps: Secondary | ICD-10-CM

## 2018-01-06 DIAGNOSIS — R600 Localized edema: Secondary | ICD-10-CM | POA: Insufficient documentation

## 2018-01-06 DIAGNOSIS — Z7982 Long term (current) use of aspirin: Secondary | ICD-10-CM

## 2018-01-06 DIAGNOSIS — G8929 Other chronic pain: Secondary | ICD-10-CM | POA: Diagnosis present

## 2018-01-06 DIAGNOSIS — K219 Gastro-esophageal reflux disease without esophagitis: Secondary | ICD-10-CM | POA: Diagnosis present

## 2018-01-06 LAB — COMPREHENSIVE METABOLIC PANEL
ALT: 18 U/L (ref 14–54)
AST: 18 U/L (ref 15–41)
Albumin: 4.2 g/dL (ref 3.5–5.0)
Alkaline Phosphatase: 103 U/L (ref 38–126)
Anion gap: 10 (ref 5–15)
BUN: 17 mg/dL (ref 6–20)
CO2: 27 mmol/L (ref 22–32)
Calcium: 8.8 mg/dL — ABNORMAL LOW (ref 8.9–10.3)
Chloride: 107 mmol/L (ref 101–111)
Creatinine, Ser: 1 mg/dL (ref 0.44–1.00)
GFR calc Af Amer: >60 mL/min (ref >60)
GFR calc non Af Amer: 58 mL/min — ABNORMAL LOW (ref >60)
Glucose, Bld: 131 mg/dL — ABNORMAL HIGH (ref 65–99)
Potassium: 3.4 mmol/L — ABNORMAL LOW (ref 3.5–5.1)
Sodium: 144 mmol/L (ref 135–145)
Total Bilirubin: 0.7 mg/dL (ref 0.3–1.2)
Total Protein: 7.2 g/dL (ref 6.5–8.1)

## 2018-01-06 LAB — HEMOGLOBIN: Hemoglobin: 10.1 g/dL — ABNORMAL LOW (ref 12.0–15.0)

## 2018-01-06 LAB — CBC WITH DIFFERENTIAL/PLATELET
Basophils Absolute: 0 10*3/uL (ref 0.0–0.1)
Basophils Relative: 0.8 % (ref 0.0–3.0)
Eosinophils Absolute: 0.2 10*3/uL (ref 0.0–0.7)
Eosinophils Relative: 4 % (ref 0.0–5.0)
HCT: 36.6 % (ref 36.0–46.0)
Hemoglobin: 11.7 g/dL — ABNORMAL LOW (ref 12.0–15.0)
Lymphocytes Relative: 28.4 % (ref 12.0–46.0)
Lymphs Abs: 1.2 10*3/uL (ref 0.7–4.0)
MCHC: 31.9 g/dL (ref 30.0–36.0)
MCV: 73.4 fl — ABNORMAL LOW (ref 78.0–100.0)
Monocytes Absolute: 0.2 10*3/uL (ref 0.1–1.0)
Monocytes Relative: 5.8 % (ref 3.0–12.0)
Neutro Abs: 2.6 10*3/uL (ref 1.4–7.7)
Neutrophils Relative %: 61 % (ref 43.0–77.0)
Platelets: 263 10*3/uL (ref 150.0–400.0)
RBC: 4.99 Mil/uL (ref 3.87–5.11)
RDW: 14.4 % (ref 11.5–15.5)
WBC: 4.2 10*3/uL (ref 4.0–10.5)

## 2018-01-06 LAB — CBC
HCT: 36.4 % (ref 36.0–46.0)
Hemoglobin: 11.5 g/dL — ABNORMAL LOW (ref 12.0–15.0)
MCH: 23.6 pg — ABNORMAL LOW (ref 26.0–34.0)
MCHC: 31.6 g/dL (ref 30.0–36.0)
MCV: 74.6 fL — ABNORMAL LOW (ref 78.0–100.0)
Platelets: 296 10*3/uL (ref 150–400)
RBC: 4.88 MIL/uL (ref 3.87–5.11)
RDW: 14.6 % (ref 11.5–15.5)
WBC: 5.5 10*3/uL (ref 4.0–10.5)

## 2018-01-06 LAB — URINALYSIS, ROUTINE W REFLEX MICROSCOPIC
Bilirubin Urine: NEGATIVE
Glucose, UA: NEGATIVE mg/dL
Ketones, ur: 5 mg/dL — AB
Leukocytes, UA: NEGATIVE
Nitrite: NEGATIVE
Protein, ur: 30 mg/dL — AB
Specific Gravity, Urine: 1.026 (ref 1.005–1.030)
pH: 5 (ref 5.0–8.0)

## 2018-01-06 LAB — TYPE AND SCREEN
ABO/RH(D): O POS
Antibody Screen: NEGATIVE

## 2018-01-06 LAB — HEMOGLOBIN AND HEMATOCRIT, BLOOD
HCT: 31.1 % — ABNORMAL LOW (ref 36.0–46.0)
Hemoglobin: 9.8 g/dL — ABNORMAL LOW (ref 12.0–15.0)

## 2018-01-06 LAB — CK: Total CK: 249 U/L — ABNORMAL HIGH (ref 7–177)

## 2018-01-06 LAB — POC OCCULT BLOOD, ED: Fecal Occult Bld: POSITIVE — AB

## 2018-01-06 LAB — ABO/RH: ABO/RH(D): O POS

## 2018-01-06 MED ORDER — FAMOTIDINE IN NACL 20-0.9 MG/50ML-% IV SOLN
20.0000 mg | Freq: Two times a day (BID) | INTRAVENOUS | Status: DC
  Administered 2018-01-06 – 2018-01-14 (×16): 20 mg via INTRAVENOUS
  Filled 2018-01-06 (×16): qty 50

## 2018-01-06 MED ORDER — PRAVASTATIN SODIUM 20 MG PO TABS
20.0000 mg | ORAL_TABLET | Freq: Every day | ORAL | Status: DC
  Administered 2018-01-06 – 2018-01-14 (×9): 20 mg via ORAL
  Filled 2018-01-06 (×9): qty 1

## 2018-01-06 MED ORDER — POLYETHYLENE GLYCOL 3350 17 G PO PACK
17.0000 g | PACK | Freq: Two times a day (BID) | ORAL | Status: DC
  Administered 2018-01-06 – 2018-01-12 (×8): 17 g via ORAL
  Filled 2018-01-06 (×8): qty 1

## 2018-01-06 MED ORDER — POTASSIUM CHLORIDE CRYS ER 20 MEQ PO TBCR
40.0000 meq | EXTENDED_RELEASE_TABLET | Freq: Once | ORAL | Status: AC
  Administered 2018-01-06: 40 meq via ORAL
  Filled 2018-01-06: qty 2

## 2018-01-06 MED ORDER — IOPAMIDOL (ISOVUE-300) INJECTION 61%
INTRAVENOUS | Status: AC
  Administered 2018-01-06: 14:00:00
  Filled 2018-01-06: qty 100

## 2018-01-06 MED ORDER — MINERAL OIL RE ENEM
1.0000 | ENEMA | Freq: Once | RECTAL | Status: AC
  Administered 2018-01-06: 1 via RECTAL
  Filled 2018-01-06: qty 1

## 2018-01-06 MED ORDER — HYDROCHLOROTHIAZIDE 25 MG PO TABS: 25.0000 mg | ORAL_TABLET | Freq: Every day | ORAL | 1 refills | Status: DC

## 2018-01-06 MED ORDER — LOSARTAN POTASSIUM 50 MG PO TABS
50.0000 mg | ORAL_TABLET | Freq: Every day | ORAL | Status: DC
  Administered 2018-01-06 – 2018-01-09 (×4): 50 mg via ORAL
  Filled 2018-01-06 (×4): qty 1

## 2018-01-06 MED ORDER — HYDROMORPHONE HCL 1 MG/ML IJ SOLN
1.0000 mg | Freq: Once | INTRAMUSCULAR | Status: AC
Start: 2018-01-06 — End: 2018-01-06
  Administered 2018-01-06: 1 mg via INTRAVENOUS
  Filled 2018-01-06: qty 1

## 2018-01-06 MED ORDER — MORPHINE SULFATE (PF) 2 MG/ML IV SOLN
2.0000 mg | INTRAVENOUS | Status: DC | PRN
  Filled 2018-01-06: qty 1

## 2018-01-06 MED ORDER — ONDANSETRON HCL 4 MG/2ML IJ SOLN
4.0000 mg | Freq: Three times a day (TID) | INTRAMUSCULAR | Status: DC | PRN
Start: 2018-01-06 — End: 2018-01-14

## 2018-01-06 MED ORDER — MORPHINE SULFATE (PF) 4 MG/ML IV SOLN
4.0000 mg | Freq: Once | INTRAVENOUS | Status: AC
  Administered 2018-01-06: 4 mg via INTRAVENOUS
  Filled 2018-01-06: qty 1

## 2018-01-06 MED ORDER — IOPAMIDOL (ISOVUE-300) INJECTION 61%
100.0000 mL | Freq: Once | INTRAVENOUS | Status: AC | PRN
  Administered 2018-01-06: 100 mL via INTRAVENOUS

## 2018-01-06 MED ORDER — ONDANSETRON HCL 4 MG/2ML IJ SOLN
4.0000 mg | Freq: Once | INTRAMUSCULAR | Status: AC
  Administered 2018-01-06: 4 mg via INTRAVENOUS
  Filled 2018-01-06: qty 2

## 2018-01-06 MED ORDER — HYDROCHLOROTHIAZIDE 25 MG PO TABS
25.0000 mg | ORAL_TABLET | Freq: Every day | ORAL | Status: DC
  Administered 2018-01-06: 25 mg via ORAL
  Filled 2018-01-06: qty 1

## 2018-01-06 MED ORDER — SODIUM CHLORIDE 0.9 % IV BOLUS
1000.0000 mL | Freq: Once | INTRAVENOUS | Status: AC
  Administered 2018-01-06: 1000 mL via INTRAVENOUS

## 2018-01-06 MED ORDER — POLYETHYLENE GLYCOL 3350 17 G PO PACK: 17.0000 g | PACK | Freq: Every day | ORAL | Status: DC

## 2018-01-06 NOTE — Telephone Encounter (Signed)
Just seeing this message now. It appears she is already in the ED, has had multiple episodes of BRBPR. She had a colonoscopy previously showing diverticulosis and hemorrhoids, could be bleeding from either one of those. Will await ED course

## 2018-01-06 NOTE — Telephone Encounter (Signed)
There are no appointments available until Thursday with an APP, and since you are the hospital doc this week, the only other option would be to see if Anderson Malta could come over here to see this patient or ED. Please advise. Thank you.

## 2018-01-06 NOTE — Patient Instructions (Addendum)
A few things to remember from today's visit:   Vitamin D deficiency, unspecified  Hypertension, essential, benign - Plan: hydrochlorothiazide (HYDRODIURIL) 25 MG tablet  Bright red rectal bleeding - Plan: CBC with Differential/Platelet, Ambulatory referral to Gastroenterology  Pain in both lower extremities - Plan: CK  Bilateral lower extremity edema   Check blood pressure at home. HCTZ added.   Rectal Bleeding Rectal bleeding is when blood comes out of the opening of the butt (anus). People with this kind of bleeding may notice bright red blood in their underwear or in the toilet after they poop (have a bowel movement). They may also have dark red or black poop (stool). Rectal bleeding is often a sign that something is wrong. It needs to be checked by a doctor. Follow these instructions at home: Watch for any changes in your condition. Take these actions to help with bleeding and discomfort:  Eat a diet that is high in fiber. This will keep your poop soft so it is easier for you to poop without pushing too hard. Ask your doctor to tell you what foods and drinks are high in fiber.  Drink enough fluid to keep your pee (urine) clear or pale yellow. This also helps keep your poop soft.  Try taking a warm bath. This may help with pain.  Keep all follow-up visits as told by your doctor. This is important.  Get help right away if:  You have new bleeding.  You have more bleeding than before.  You have black or dark red poop.  You throw up (vomit) blood or something that looks like coffee grounds.  You have pain or tenderness in your belly (abdomen).  You have a fever.  You feel weak.  You feel sick to your stomach (nauseous).  You pass out (faint).  You have very bad pain in your butt.  You cannot poop. This information is not intended to replace advice given to you by your health care provider. Make sure you discuss any questions you have with your health care  provider. Document Released: 03/28/2011 Document Revised: 12/22/2015 Document Reviewed: 09/11/2015 Elsevier Interactive Patient Education  [employer].  Please be sure medication list is accurate. If a new problem present, please set up appointment sooner than planned today.

## 2018-01-06 NOTE — ED Triage Notes (Signed)
Patient c/o bright red rectal bleeding x 4 bowel movements. Patient went to her PCP today for leg pain and a request for colonoscopy made . Patient decided to come to the ED because she was still having rectal bleeding. Patient denies any pain.

## 2018-01-06 NOTE — Progress Notes (Signed)
Pt had large bloody bowel movement after enema was given.

## 2018-01-06 NOTE — Consult Note (Signed)
Consultation  Referring Provider: Dr. Wilson Singer     Primary Care Physician:  Martinique, [name], MD Primary Gastroenterologist: Dr. Havery Moros  Reason for Consultation: Bright red blood per rectum         HPI:   Jennifer Davila is a 62 y.o. female with a past medical history as listed below, who presented to the ER todaylwith a past med history as listed below, who presented to the ER today complaining of bright red rectal bleeding x4.     Today, explains that she was going to her primary care physician this morning and upon arriving around 715 am felt the urge to pass gas and headed to the bathroom where she passed nothing but bright red blood and could not tell if she had a bowel movement.  This filled the toilet and scared the patient.  Occurred 2 more times while at the physician's office.  Labs were drawn and patient was told to go home for further recommendations.  She decided to go to her sister's instead and developed abdominal pain which she rates as an 8-9/10 mostly across her lower abdomen and also developed a "sensation all over my body" of weakness and dizziness with some sweating.  Sister called the ambulance and arrived here and had one further episode of bleeding which she believes was "from the front this time".  This was around 1:30 this afternoon.  No further since.  Has received pain medication which is "not really helping".  Associated symptoms include some nausea.    Typically patient is constipated using daily stool softeners but "due to finances" had not been using these on a daily basis.  Did take a laxative on Saturday which resulted in a normal solid bowel movement Sunday morning.  Was having no abdominal pain before this episode.    Previous history of lap band.    Denies fever, chills, weight loss, anorexia or vomiting.  ED course: hemoglobin 11.5 (baseline~12 in 2017), vitals stable  Previous GI history: 10/10/2016 Dr. Havery Moros: Colonoscopy: Impression: One 3  mm polyp in the cecum, one 4 mm polyp at the hepatic flexure, diverticulosis in the entire examined colon, internal hemorrhoids, anal papulae were hypertrophied and otherwise normal; pathology: Tubular adenomas-repeat recommended in 5 years  Past Medical History:  Diagnosis Date  . Arthritis   . Constipation    on stool softener  . GERD (gastroesophageal reflux disease)   . Hyperlipidemia   . Hypertension   . Trouble swallowing 2013   following lap band    Past Surgical History:  Procedure Laterality Date  . ABDOMINAL HYSTERECTOMY    . CHOLECYSTECTOMY    . LAPAROSCOPIC GASTRIC BANDING    . LAPAROSCOPIC REPAIR AND REMOVAL OF GASTRIC BAND  2013-2014    Family History  Problem Relation Age of Onset  . Hypertension Mother   . Stroke Mother   . Diabetes Father   . Alcohol abuse Father   . Kidney disease Father   . Colon polyps Brother   . Cancer Brother   . Alcohol abuse Brother      Social History   Tobacco Use  . Smoking status: Former Smoker    Types: Cigarettes    Last attempt to quit: 10/02/1972    Years since quitting: 45.2  . Smokeless tobacco: Never Used  Substance Use Topics  . Alcohol use: No  . Drug use: No    Prior to Admission medications   Medication Sig Start Date End Date Taking?  Authorizing Provider  acetaminophen (TYLENOL) 500 MG tablet Take 1,000 mg by mouth every 6 (six) hours as needed (pain).   Yes [provider]  aspirin 81 MG tablet Take 81 mg by mouth every morning.    Yes [provider]  esomeprazole (NEXIUM) 40 MG capsule Take 1 capsule (40 mg total) by mouth daily. 10/18/17  Yes Martinique, [name], MD  hydrochlorothiazide (HYDRODIURIL) 25 MG tablet Take 1 tablet (25 mg total) by mouth daily. 01/06/18  Yes Martinique, [name], MD  ibuprofen (ADVIL,MOTRIN) 200 MG tablet Take 400 mg by mouth daily as needed (pain).   Yes [provider]  losartan (COZAAR) 50 MG tablet Take 1 tablet (50 mg total) by mouth daily. 09/04/17  Yes  Martinique, [name], MD  naproxen (NAPROSYN) 500 MG tablet Take 1 tablet (500 mg total) by mouth daily as needed. 10/18/17  Yes Martinique, [name], MD  naproxen sodium (ALEVE) 220 MG tablet Take 440 mg by mouth daily as needed (pain).   Yes [provider]  pravastatin (PRAVACHOL) 20 MG tablet Take 1 tablet (20 mg total) by mouth daily. 09/04/17  Yes Martinique, [name], MD  Sennosides (EX-LAX PO) Take 1 tablet by mouth daily as needed (constipation).   Yes [provider]  tiZANidine (ZANAFLEX) 4 MG tablet Take 0.5-1 tablets (2-4 mg total) by mouth at bedtime. Patient taking differently: Take 2-4 mg by mouth as needed.  10/18/17  Yes Martinique, [name], MD    Current Facility-Administered Medications  Medication Dose Route Frequency Provider Last Rate Last Dose  . 0.9 %  sodium chloride infusion  500 mL Intravenous Continuous Armbruster, Carlota Raspberry, MD       Current Outpatient Medications  Medication Sig Dispense Refill  . acetaminophen (TYLENOL) 500 MG tablet Take 1,000 mg by mouth every 6 (six) hours as needed (pain).    Marland Kitchen aspirin 81 MG tablet Take 81 mg by mouth every morning.     Marland Kitchen esomeprazole (NEXIUM) 40 MG capsule Take 1 capsule (40 mg total) by mouth daily. 90 capsule 3  . hydrochlorothiazide (HYDRODIURIL) 25 MG tablet Take 1 tablet (25 mg total) by mouth daily. 90 tablet 1  . ibuprofen (ADVIL,MOTRIN) 200 MG tablet Take 400 mg by mouth daily as needed (pain).    Marland Kitchen losartan (COZAAR) 50 MG tablet Take 1 tablet (50 mg total) by mouth daily. 90 tablet 1  . naproxen (NAPROSYN) 500 MG tablet Take 1 tablet (500 mg total) by mouth daily as needed. 30 tablet 2  . naproxen sodium (ALEVE) 220 MG tablet Take 440 mg by mouth daily as needed (pain).    . pravastatin (PRAVACHOL) 20 MG tablet Take 1 tablet (20 mg total) by mouth daily. 90 tablet 1  . Sennosides (EX-LAX PO) Take 1 tablet by mouth daily as needed (constipation).    Marland Kitchen tiZANidine (ZANAFLEX) 4 MG tablet Take 0.5-1 tablets (2-4 mg total)  by mouth at bedtime. (Patient taking differently: Take 2-4 mg by mouth as needed. ) 30 tablet 1    Allergies as of 01/06/2018  . (No Known Allergies)     Review of Systems:    Constitutional: No weight loss, fever or chills Skin: No rash  Cardiovascular: No chest pain Respiratory: No SOB  Gastrointestinal: See HPI and otherwise negative Genitourinary: No dysuria  Neurological: No headache Musculoskeletal: No new muscle or joint pain Hematologic: No bruising Psychiatric: No history of depression or anxiety   Physical Exam:  Vital signs in last  24 hours: Temp:  [98.3 F (36.8 C)-98.5 F (36.9 C)] 98.3 F (36.8 C) (06/10 1113) Pulse Rate:  [72-82] 75 (06/10 1411) Resp:  [12-18] 13 (06/10 1411) BP: (129-146)/(79-92) 142/79 (06/10 1411) SpO2:  [97 %-100 %] 99 % (06/10 1411) Weight:  [240 lb (108.9 kg)-241 lb 6 oz (109.5 kg)] 240 lb (108.9 kg) (06/10 1113)   General:   Pleasant overweight AA female appears to be in NAD, Well developed, Well nourished, alert and cooperative Head:  Normocephalic and atraumatic. Eyes:   PEERL, EOMI. No icterus. Conjunctiva pink. Ears:  Normal auditory acuity. Neck:  Supple Throat: Oral cavity and pharynx without inflammation, swelling or lesion.  Lungs: Respirations even and unlabored. Lungs clear to auscultation bilaterally.   No wheezes, crackles, or rhonchi.  Heart: Normal S1, S2. No MRG. Regular rate and rhythm. No peripheral edema, cyanosis or pallor.  Abdomen:  Soft, nondistended, nontender. No rebound or guarding. Normal bowel sounds. No appreciable masses or hepatomegaly. Rectal:  External: multiple hemorrhoid tags; internal: no mass or lesion appreciated; Discharge: bright red blood on tip of finger Msk:  Symmetrical without gross deformities. Peripheral pulses intact.  Extremities:  Without edema, no deformity or joint abnormality. Normal ROM, normal sensation. Neurologic:  Alert and  oriented x4;  grossly normal neurologically.  Skin:    Dry and intact without significant lesions or rashes. Psychiatric:  Demonstrates good judgement and reason without abnormal affect or behaviors.   LAB RESULTS: Recent Labs    01/06/18 0845 01/06/18 1205  WBC 4.2 5.5  HGB 11.7* 11.5*  HCT 36.6 36.4  PLT 263.0 296   BMET Recent Labs    01/06/18 1205  NA 144  K 3.4*  CL 107  CO2 27  GLUCOSE 131*  BUN 17  CREATININE 1.00  CALCIUM 8.8*   LFT Recent Labs    01/06/18 1205  PROT 7.2  ALBUMIN 4.2  AST 18  ALT 18  ALKPHOS 103  BILITOT 0.7   STUDIES: Ct Abdomen Pelvis W Contrast  Result Date: 01/06/2018 CLINICAL DATA:  Lower abdominal pain with rectal bleeding and hematuria EXAM: CT ABDOMEN AND PELVIS WITH CONTRAST TECHNIQUE: Multidetector CT imaging of the abdomen and pelvis was performed using the standard protocol following bolus administration of intravenous contrast. CONTRAST:  160mL ISOVUE-300 IOPAMIDOL (ISOVUE-300) INJECTION 61% COMPARISON:  None. FINDINGS: Lower chest: No acute abnormality. Hepatobiliary: Gallbladder is been surgically removed. Mild biliary prominence is noted related to the post cholecystectomy state. A small 1 cm cyst is noted within the left lobe of the liver. Pancreas: Unremarkable. No pancreatic ductal dilatation or surrounding inflammatory changes. Spleen: Normal in size without focal abnormality. Adrenals/Urinary Tract: Adrenal glands are unremarkable. Kidneys are normal, without renal calculi, focal lesion, or hydronephrosis. Bladder is unremarkable. Stomach/Bowel: Considerable fecal material is noted within the rectal vault consistent with a mild fecal impaction diverticular change of the colon is noted. No obstructive changes are seen. The appendix is within normal limits. Vascular/Lymphatic: No significant vascular findings are present. No enlarged abdominal or pelvic lymph nodes. Reproductive: Status post hysterectomy. No adnexal masses. Other: No abdominal wall hernia or abnormality. No  abdominopelvic ascites. Musculoskeletal: Mild degenerative changes of the lumbar spine are noted. IMPRESSION: Changes consistent with fecal impaction in the rectum. Chronic changes as described above. Electronically Signed   By: Inez Catalina M.D.   On: 01/06/2018 14:09     Impression / Plan:   Impression: 1. Bright red blood per rectum: 4x since 7:15 this am, colo with diverticulosis  in 2018, b/l lower abdominal pain, normal CT other than fecal impaction? ; Question possible stercoral ulcer? Vs diverticulosis? Vs other 2. H/o diverticulosis: last colo 09/2016  Plan: 1. Agree with observation overnight 2. Monitor hgb q8h with transfusion as needed <7 3. Patient may have clears for now 4. If increased acute GI bleeding recommend bleeding scan 5. Please await any final recommendations from Dr. Havery Moros later today  Thank you for your kind consultation, we will continue to follow.  Lavone Nian Gaile Allmon  01/06/2018, 3:00 PM Pager #: [phone]

## 2018-01-06 NOTE — ED Provider Notes (Signed)
[facility] DEPT Provider Note   CSN: [id number] Arrival date & time: 01/06/18  1101     History   Chief Complaint Chief Complaint  Patient presents with  . Rectal Bleeding  . Nausea    HPI Jennifer Davila is a 62 y.o. female.  HPI   Jennifer Davila is a 62yo female with a history of hypertension, hyperlipidemia, obesity, lower extremity edema who presents to the emergency department for evaluation of rectal bleeding.  Patient states that she has had 3 episodes of bright red blood per rectum today, has not passed a bowel movement.  States that the first episode occurred while she was at her PCP office earlier this morning.  Also endorses bilateral lower abdominal pain which "feels like I am going to get sick."  Pain is about a 6/10 in severity at this time and she reports it is worsened with palpation.  She also states she feels lightheaded today.  Reports that she just noticed some blood in her urine when providing a sample in the ED.  Denies fevers, chills, vomiting, rectal pain, dysuria, urinary frequency, diarrhea, chest pain, shortness of breath, headache, syncope.  She has had an abdominal hysterectomy and lap band surgery.  Last bowel movement was yesterday and normal.  She reports her last colonoscopy was in 2018 at [place].   Past Medical History:  Diagnosis Date  . Arthritis   . Constipation    on stool softener  . GERD (gastroesophageal reflux disease)   . Hyperlipidemia   . Hypertension   . Trouble swallowing 2013   following lap band    Patient Active Problem List   Diagnosis Date Noted  . Leg pain 01/06/2018  . Bilateral lower extremity edema 01/06/2018  . Leg cramping 10/18/2017  . Vitamin D deficiency, unspecified 10/18/2017  . GERD (gastroesophageal reflux disease) 10/18/2017  . Hypertension, essential, benign 09/04/2017  . Hyperlipidemia 09/04/2017  . Morbid obesity with BMI of 40.0-44.9, adult ([place]) 09/04/2017  .  Vertigo 09/04/2017  . Hip pain 05/25/2015    Past Surgical History:  Procedure Laterality Date  . ABDOMINAL HYSTERECTOMY    . CHOLECYSTECTOMY    . LAPAROSCOPIC GASTRIC BANDING    . LAPAROSCOPIC REPAIR AND REMOVAL OF GASTRIC BAND  2013-2014     OB History   None      Home Medications    Prior to Admission medications   Medication Sig Start Date End Date Taking? Authorizing Provider  acetaminophen (TYLENOL) 500 MG tablet Take 1,000 mg by mouth every 6 (six) hours as needed (pain).   Yes [provider]  aspirin 81 MG tablet Take 81 mg by mouth every morning.    Yes [provider]  esomeprazole (NEXIUM) 40 MG capsule Take 1 capsule (40 mg total) by mouth daily. 10/18/17  Yes Martinique, [name], MD  hydrochlorothiazide (HYDRODIURIL) 25 MG tablet Take 1 tablet (25 mg total) by mouth daily. 01/06/18  Yes Martinique, [name], MD  ibuprofen (ADVIL,MOTRIN) 200 MG tablet Take 400 mg by mouth daily as needed (pain).   Yes [provider]  losartan (COZAAR) 50 MG tablet Take 1 tablet (50 mg total) by mouth daily. 09/04/17  Yes Martinique, [name], MD  naproxen (NAPROSYN) 500 MG tablet Take 1 tablet (500 mg total) by mouth daily as needed. 10/18/17  Yes Martinique, [name], MD  naproxen sodium (ALEVE) 220 MG tablet Take 440 mg by mouth daily as needed (pain).   Yes [provider]  pravastatin (PRAVACHOL) 20 MG tablet Take 1 tablet (20 mg total) by mouth daily. 09/04/17  Yes Martinique, [name], MD  Sennosides (EX-LAX PO) Take 1 tablet by mouth daily as needed (constipation).   Yes [provider]  tiZANidine (ZANAFLEX) 4 MG tablet Take 0.5-1 tablets (2-4 mg total) by mouth at bedtime. Patient taking differently: Take 2-4 mg by mouth as needed.  10/18/17  Yes Martinique, [name], MD    Family History Family History  Problem Relation Age of Onset  . Hypertension Mother   . Stroke Mother   . Diabetes Father   . Alcohol abuse Father   . Kidney disease Father   . Colon  polyps Brother   . Cancer Brother   . Alcohol abuse Brother     Social History Social History   Tobacco Use  . Smoking status: Former Smoker    Types: Cigarettes    Last attempt to quit: 10/02/1972    Years since quitting: 45.2  . Smokeless tobacco: Never Used  Substance Use Topics  . Alcohol use: No  . Drug use: No     Allergies   Patient has no known allergies.   Review of Systems Review of Systems  Constitutional: Negative for chills and fever.  Eyes: Negative for visual disturbance.  Respiratory: Negative for shortness of breath.   Cardiovascular: Negative for chest pain.  Gastrointestinal: Positive for abdominal pain, anal bleeding and nausea. Negative for abdominal distention, diarrhea and vomiting.  Genitourinary: Positive for hematuria. Negative for difficulty urinating, dysuria and vaginal bleeding.  Musculoskeletal: Negative for gait problem.  Skin: Negative for wound.  Neurological: Positive for light-headedness. Negative for syncope and headaches.  Psychiatric/Behavioral: Negative for agitation.     Physical Exam Updated Vital Signs BP (!) 141/79   Pulse 72   Temp 98.3 F (36.8 C) (Oral)   Resp 15   Ht 5\' 2"  (1.575 m)   Wt 108.9 kg (240 lb)   SpO2 98%   BMI 43.90 kg/m   Physical Exam  Constitutional: She is oriented to person, place, and time. She appears well-developed and well-nourished. No distress.  Sitting at bedside in no apparent distress, non-toxic appearing.   HENT:  Head: Normocephalic and atraumatic.  Mouth/Throat: Oropharynx is clear and moist. No oropharyngeal exudate.  Eyes: Pupils are equal, round, and reactive to light. Conjunctivae are normal. Right eye exhibits no discharge. Left eye exhibits no discharge.  No conjunctival pallor.  Neck: Normal range of motion. Neck supple.  Cardiovascular: Normal rate, regular rhythm and intact distal pulses.  Pulmonary/Chest: Effort normal and breath sounds normal. No stridor. No respiratory  distress. She has no wheezes. She has no rales.  Abdominal: Soft.  Abdomen soft and non-distended. Bowel sounds normal. Acutely tender to palpation over bilateral lower abdomen without guarding or rigidity. No rebound tenderness. Midline surgical scar below umbilicus.   Genitourinary:  Genitourinary Comments: Chaperone present for exam. Gross bright red blood noted on rectal exam. Otherwise normal tone, no tenderness, no mass or fissure, no hemorrhoids noted.  Musculoskeletal: Normal range of motion.  Neurological: She is alert and oriented to person, place, and time. Coordination normal.  Skin: Skin is warm and dry. Capillary refill takes less than 2 seconds. She is not diaphoretic.  Psychiatric: She has a normal mood and affect. Her behavior is normal.  Nursing note and vitals reviewed.    ED Treatments / Results  Labs (all labs ordered are listed, but only abnormal results are displayed) Labs  Reviewed  COMPREHENSIVE METABOLIC PANEL - Abnormal; Notable for the following components:      Result Value   Potassium 3.4 (*)    Glucose, Bld 131 (*)    Calcium 8.8 (*)    GFR calc non Af Amer 58 (*)    All other components within normal limits  CBC - Abnormal; Notable for the following components:   Hemoglobin 11.5 (*)    MCV 74.6 (*)    MCH 23.6 (*)    All other components within normal limits  URINALYSIS, ROUTINE W REFLEX MICROSCOPIC  POC OCCULT BLOOD, ED  TYPE AND SCREEN  ABO/RH    EKG None  Radiology Ct Abdomen Pelvis W Contrast  Result Date: 01/06/2018 CLINICAL DATA:  Lower abdominal pain with rectal bleeding and hematuria EXAM: CT ABDOMEN AND PELVIS WITH CONTRAST TECHNIQUE: Multidetector CT imaging of the abdomen and pelvis was performed using the standard protocol following bolus administration of intravenous contrast. CONTRAST:  176mL ISOVUE-300 IOPAMIDOL (ISOVUE-300) INJECTION 61% COMPARISON:  None. FINDINGS: Lower chest: No acute abnormality. Hepatobiliary: Gallbladder is  been surgically removed. Mild biliary prominence is noted related to the post cholecystectomy state. A small 1 cm cyst is noted within the left lobe of the liver. Pancreas: Unremarkable. No pancreatic ductal dilatation or surrounding inflammatory changes. Spleen: Normal in size without focal abnormality. Adrenals/Urinary Tract: Adrenal glands are unremarkable. Kidneys are normal, without renal calculi, focal lesion, or hydronephrosis. Bladder is unremarkable. Stomach/Bowel: Considerable fecal material is noted within the rectal vault consistent with a mild fecal impaction diverticular change of the colon is noted. No obstructive changes are seen. The appendix is within normal limits. Vascular/Lymphatic: No significant vascular findings are present. No enlarged abdominal or pelvic lymph nodes. Reproductive: Status post hysterectomy. No adnexal masses. Other: No abdominal wall hernia or abnormality. No abdominopelvic ascites. Musculoskeletal: Mild degenerative changes of the lumbar spine are noted. IMPRESSION: Changes consistent with fecal impaction in the rectum. Chronic changes as described above. Electronically Signed   By: Inez Catalina M.D.   On: 01/06/2018 14:09    Procedures Procedures (including critical care time)  Medications Ordered in ED Medications - No data to display   Initial Impression / Assessment and Plan / ED Course  I have reviewed the triage vital signs and the nursing notes.  Pertinent labs & imaging results that were available during my care of the patient were reviewed by me and considered in my medical decision making (see chart for details).     Patient presents the emergency department for evaluation of three episodes of bright red blood per rectum without stool today.  Also reporting bilateral lower abdominal pain.  She is not on blood thinners.  She sees [facility] where she had her last colonoscopy in 2018.  On exam she is afebrile and nontoxic-appearing.  Vital signs  stable.  Rectal exam with gross blood noted, no hemorrhoids or pain with rectal exam.  No stool impaction.  Abdomen soft and nondistended, acutely tender in the bilateral lower quadrants.  Labs reviewed.  Patient's hemoglobin stable at 11.5.  No leukocytosis.  CMP without any major electrolyte abnormalities.  Liver enzymes within normal and kidney function normal.  UA reveals red blood cells present, no white blood cells and patient denies urinary symptoms.  Suspect that this is contaminated from rectal bleeding.  CT abdomen pelvis ordered given patient reports significant lower abdominal pain.  Patient treated with fluids, antiemetic and pain management.  CT scan returned, no acute abnormality.  Does  reveal fecal buildup in the rectum, consistent with constipation.  Bleeding likely diverticular.  Discussed this patient with PA Ellouise Newer with [facility] who agrees with plan to admit for observation and repeat hemoglobin studies. Patient will be admitted by Dr. Martinique with the Hospitalist service. Patient informed.   Final Clinical Impressions(s) / ED Diagnoses   Final diagnoses:  None    ED Discharge Orders    None       Bernarda Caffey 01/07/18 3428    Virgel Manifold, MD 01/07/18 1308

## 2018-01-06 NOTE — ED Notes (Signed)
Report given to Terri, RN.

## 2018-01-06 NOTE — H&P (Signed)
History and Physical    Ramiah Helfrich TIR:[id number] DOB: 05/15/1956 DOA: 01/06/2018  PCP: [place], [name], MD   Patient coming from: Home    Chief Complaint:  Bright blood red per rectum  HPI: Jennifer Davila is a 62 y.o. female with medical history significant of hypertension, hyperlipidemia, chronic bilateral lower extremity pain, diverticulosis who presents to the emergency department after she had episodes of bright blood red per rectum.  Patient was in her PCPs office today for her regular visit for evaluation of bilateral lower extremity pain.  She went to the bathroom and noticed that there was bright red blood in the toilet bowl.  It was just blood not mixed with stool.  Since then she had  total of 3 episodes and then she  presented to the emergency department.  Patient also complains of left-sided abdominal pain mainly on the left lower quadrant.  Patient had a colonoscopy in March 2018 which showed diverticulosis.  She also history of internal hemorrhoids.  Patient was on aspirin .She also takes Advil or lower extremity pain up to twice a day but not every day. She reports of constipation and her last bowel movement was on Saturday.  CT abdomen/pelvis done in the emergency department showed fecal impaction in the rectum.  Patient does not complain of any bloating or pain on the rectal or perianal area. [facility] was called for admission.  GI consulted.  Plan for admission on observation. Patient seen and examined at bedside in the emergency department.  She was complaining of crampy abdominal pain on the left lower quadrant.  She was clinically stable.  She denied any chest pain, shortness of breath, palpitations, fever, chills, nausea, vomiting or dysuria.   ED Course: GI consulted.  Review of Systems: As per HPI otherwise 10 point review of systems negative.    Past Medical History:  Diagnosis Date  . Arthritis   . Constipation    on stool softener    . GERD (gastroesophageal reflux disease)   . Hyperlipidemia   . Hypertension   . Trouble swallowing 2013   following lap band    Past Surgical History:  Procedure Laterality Date  . ABDOMINAL HYSTERECTOMY    . CHOLECYSTECTOMY    . LAPAROSCOPIC GASTRIC BANDING    . LAPAROSCOPIC REPAIR AND REMOVAL OF GASTRIC BAND  2013-2014     reports that she quit smoking about 45 years ago. Her smoking use included cigarettes. She has never used smokeless tobacco. She reports that she does not drink alcohol or use drugs.  No Known Allergies  Family History  Problem Relation Age of Onset  . Hypertension Mother   . Stroke Mother   . Diabetes Father   . Alcohol abuse Father   . Kidney disease Father   . Colon polyps Brother   . Cancer Brother   . Alcohol abuse Brother      Prior to Admission medications   Medication Sig Start Date End Date Taking? Authorizing Provider  acetaminophen (TYLENOL) 500 MG tablet Take 1,000 mg by mouth every 6 (six) hours as needed (pain).   Yes [provider]  aspirin 81 MG tablet Take 81 mg by mouth every morning.    Yes [provider]  esomeprazole (NEXIUM) 40 MG capsule Take 1 capsule (40 mg total) by mouth daily. 10/18/17  Yes Martinique, [name], MD  hydrochlorothiazide (HYDRODIURIL) 25 MG tablet Take 1 tablet (25 mg total) by mouth daily. 01/06/18  Yes Martinique, Betty  G, MD  ibuprofen (ADVIL,MOTRIN) 200 MG tablet Take 400 mg by mouth daily as needed (pain).   Yes [provider]  losartan (COZAAR) 50 MG tablet Take 1 tablet (50 mg total) by mouth daily. 09/04/17  Yes Martinique, [name], MD  naproxen (NAPROSYN) 500 MG tablet Take 1 tablet (500 mg total) by mouth daily as needed. 10/18/17  Yes Martinique, [name], MD  naproxen sodium (ALEVE) 220 MG tablet Take 440 mg by mouth daily as needed (pain).   Yes [provider]  pravastatin (PRAVACHOL) 20 MG tablet Take 1 tablet (20 mg total) by mouth daily. 09/04/17  Yes Martinique, [name], MD   Sennosides (EX-LAX PO) Take 1 tablet by mouth daily as needed (constipation).   Yes [provider]  tiZANidine (ZANAFLEX) 4 MG tablet Take 0.5-1 tablets (2-4 mg total) by mouth at bedtime. Patient taking differently: Take 2-4 mg by mouth as needed.  10/18/17  Yes [place]ique, [name], MD    Physical Exam: Vitals:   01/06/18 1508 01/06/18 1511 01/06/18 1515 01/06/18 1530  BP: 118/68   (!) 142/73  Pulse: 75 75 79 75  Resp: 16 17 15 14   Temp:      TempSrc:      SpO2: 100% 100% 100% 100%  Weight:      Height:        Constitutional: NAD, calm, comfortable Vitals:   01/06/18 1508 01/06/18 1511 01/06/18 1515 01/06/18 1530  BP: 118/68   (!) 142/73  Pulse: 75 75 79 75  Resp: 16 17 15 14   Temp:      TempSrc:      SpO2: 100% 100% 100% 100%  Weight:      Height:       Eyes: PERRL, lids and conjunctivae normal ENMT: Mucous membranes are moist. Posterior pharynx clear of any exudate or lesions.Normal dentition.  Neck: normal, supple, no masses, no thyromegaly Respiratory: clear to auscultation bilaterally, no wheezing, no crackles. Normal respiratory effort. No accessory muscle use.  Cardiovascular: Regular rate and rhythm, no murmurs / rubs / gallops. No extremity edema. 2+ pedal pulses. No carotid bruits.  Abdomen: mild tenderness on left lower quadrant, no masses palpated. No hepatosplenomegaly. Bowel sounds positive.  Musculoskeletal: no clubbing / cyanosis. No joint deformity upper and lower extremities. Good ROM, no contractures. Normal muscle tone.  Skin: no rashes, lesions, ulcers. No induration Neurologic: CN 2-12 grossly intact. Sensation intact, DTR normal. Strength 5/5 in all 4.  Psychiatric: Normal judgment and insight. Alert and oriented x 3. Normal mood.   Foley Catheter:None  Labs on Admission: I have personally reviewed following labs and imaging studies  CBC: Recent Labs  Lab 01/06/18 0845 01/06/18 1205  WBC 4.2 5.5  NEUTROABS 2.6  --   HGB 11.7* 11.5*   HCT 36.6 36.4  MCV 73.4* 74.6*  PLT 263.0 793   Basic Metabolic Panel: Recent Labs  Lab 01/06/18 1205  NA 144  K 3.4*  CL 107  CO2 27  GLUCOSE 131*  BUN 17  CREATININE 1.00  CALCIUM 8.8*   GFR: Estimated Creatinine Clearance: 66 mL/min (by C-G formula based on SCr of 1 mg/dL). Liver Function Tests: Recent Labs  Lab 01/06/18 1205  AST 18  ALT 18  ALKPHOS 103  BILITOT 0.7  PROT 7.2  ALBUMIN 4.2   No results for input(s): LIPASE, AMYLASE in the last 168 hours. No results for input(s): AMMONIA in the last 168 hours. Coagulation Profile: No results for input(s): INR,  PROTIME in the last 168 hours. Cardiac Enzymes: Recent Labs  Lab 01/06/18 0845  CKTOTAL 249*   BNP (last 3 results) No results for input(s): PROBNP in the last 8760 hours. HbA1C: No results for input(s): HGBA1C in the last 72 hours. CBG: No results for input(s): GLUCAP in the last 168 hours. Lipid Profile: No results for input(s): CHOL, HDL, LDLCALC, TRIG, CHOLHDL, LDLDIRECT in the last 72 hours. Thyroid Function Tests: No results for input(s): TSH, T4TOTAL, FREET4, T3FREE, THYROIDAB in the last 72 hours. Anemia Panel: No results for input(s): VITAMINB12, FOLATE, FERRITIN, TIBC, IRON, RETICCTPCT in the last 72 hours. Urine analysis:    Component Value Date/Time   COLORURINE YELLOW 01/06/2018 1314   APPEARANCEUR HAZY (A) 01/06/2018 1314   LABSPEC 1.026 01/06/2018 1314   PHURINE 5.0 01/06/2018 1314   GLUCOSEU NEGATIVE 01/06/2018 1314   HGBUR LARGE (A) 01/06/2018 1314   BILIRUBINUR NEGATIVE 01/06/2018 1314   KETONESUR 5 (A) 01/06/2018 1314   PROTEINUR 30 (A) 01/06/2018 1314   UROBILINOGEN 1.0 04/27/2015 1655   NITRITE NEGATIVE 01/06/2018 1314   LEUKOCYTESUR NEGATIVE 01/06/2018 1314    Radiological Exams on Admission: Ct Abdomen Pelvis W Contrast  Result Date: 01/06/2018 CLINICAL DATA:  Lower abdominal pain with rectal bleeding and hematuria EXAM: CT ABDOMEN AND PELVIS WITH CONTRAST  TECHNIQUE: Multidetector CT imaging of the abdomen and pelvis was performed using the standard protocol following bolus administration of intravenous contrast. CONTRAST:  150mL ISOVUE-300 IOPAMIDOL (ISOVUE-300) INJECTION 61% COMPARISON:  None. FINDINGS: Lower chest: No acute abnormality. Hepatobiliary: Gallbladder is been surgically removed. Mild biliary prominence is noted related to the post cholecystectomy state. A small 1 cm cyst is noted within the left lobe of the liver. Pancreas: Unremarkable. No pancreatic ductal dilatation or surrounding inflammatory changes. Spleen: Normal in size without focal abnormality. Adrenals/Urinary Tract: Adrenal glands are unremarkable. Kidneys are normal, without renal calculi, focal lesion, or hydronephrosis. Bladder is unremarkable. Stomach/Bowel: Considerable fecal material is noted within the rectal vault consistent with a mild fecal impaction diverticular change of the colon is noted. No obstructive changes are seen. The appendix is within normal limits. Vascular/Lymphatic: No significant vascular findings are present. No enlarged abdominal or pelvic lymph nodes. Reproductive: Status post hysterectomy. No adnexal masses. Other: No abdominal wall hernia or abnormality. No abdominopelvic ascites. Musculoskeletal: Mild degenerative changes of the lumbar spine are noted. IMPRESSION: Changes consistent with fecal impaction in the rectum. Chronic changes as described above. Electronically Signed   By: Inez Catalina M.D.   On: 01/06/2018 14:09     Assessment/Plan Principal Problem:   Lower GI bleed Active Problems:   Hip pain   Hypertension, essential, benign   Hyperlipidemia   Morbid obesity with BMI of 40.0-44.9, adult (HCC)   GERD (gastroesophageal reflux disease)   Hypokalemia   Constipation   Lower GI bleed: Not mixed with the stool.  Bright red blood per rectum.  Not actively bleeding at present.  Most likely source could be diverticulosis versus internal  hemorrhoids.  Patient was also having constipation and CT scan showed fecal impaction the rectum.  Bleeding could also be associated with the stretching of the rectum but patient does not complain of any pain so doubt for this. GI consulted.  Started on clear liquid diet.  We will keep her n.p.o. after midnight if she needs any intervention.  Currently her H&H is stable.  Plan is to observe her.  Constipation: Chronic issue.  CT abdomen/pelvis showed fecal impaction in the rectum.  Will  order a mineral enema.  We will also start on MiraLAX.  Hypertension: Currently blood pressure stable.  Will resume her home meds.  Hyperlipidemia: Continue statin  GERD: On omeprazole at home.  Currently on famotidine IV.  Hypokalemia: Supplemented with potassium.     Severity of Illness: The appropriate patient status for this patient is OBSERVATION.    DVT prophylaxis: SCD Code Status: Full Family Communication: Family member/friend present at the bedside Consults called:GI     Shelly Coss MD [facility] Pager [phone]  If 7PM-7AM, please contact night-coverage [email or web address] Password [facility]  01/06/2018, 3:57 PM

## 2018-01-06 NOTE — ED Notes (Signed)
ED TO INPATIENT HANDOFF REPORT  Name/Age/Gender Jennifer Davila 62 y.o. female  Code Status    Code Status Orders  (From admission, onward)        Start     Ordered   01/06/18 1551  Full code  Continuous     01/06/18 1550    Code Status History    This patient has a current code status but no historical code status.      Home/SNF/Other Home  Chief Complaint Rectal Bleeding  Level of Care/Admitting Diagnosis ED Disposition    ED Disposition Condition [facility] Area: [facility] [100102]  Level of Care: Med-Surg [16]  Diagnosis: Lower GI bleed [283662]  Admitting Physician: Shelly Coss [[id number]]  Attending Physician: Shelly Coss [[id number]]  PT Class (Do Not Modify): Observation [104]  PT Acc Code (Do Not Modify): Observation [10022]       Medical History Past Medical History:  Diagnosis Date  . Arthritis   . Constipation    on stool softener  . GERD (gastroesophageal reflux disease)   . Hyperlipidemia   . Hypertension   . Trouble swallowing 2013   following lap band    Allergies No Known Allergies  IV Location/Drains/Wounds Patient Lines/Drains/Airways Status   Active Line/Drains/Airways    Name:   Placement date:   Placement time:   Site:   Days:   Peripheral IV 01/06/18 Left Antecubital   01/06/18    1209    Antecubital   less than 1          Labs/Imaging Results for orders placed or performed during the hospital encounter of 01/06/18 (from the past 48 hour(s))  ABO/Rh     Status: None   Collection Time: 01/06/18 11:01 AM  Result Value Ref Range   ABO/RH(D)      O POS Performed at [facility], [place] [address] [place], [place] [address]   Comprehensive metabolic panel     Status: Abnormal   Collection Time: 01/06/18 12:05 PM  Result Value Ref Range   Sodium 144 135 - 145 mmol/L   Potassium 3.4 (L) 3.5 - 5.1 mmol/L   Chloride 107 101 - 111 mmol/L   CO2 27 22 - 32  mmol/L   Glucose, Bld 131 (H) 65 - 99 mg/dL   BUN 17 6 - 20 mg/dL   Creatinine, Ser 1.00 0.44 - 1.00 mg/dL   Calcium 8.8 (L) 8.9 - 10.3 mg/dL   Total Protein 7.2 6.5 - 8.1 g/dL   Albumin 4.2 3.5 - 5.0 g/dL   AST 18 15 - 41 U/L   ALT 18 14 - 54 U/L   Alkaline Phosphatase 103 38 - 126 U/L   Total Bilirubin 0.7 0.3 - 1.2 mg/dL   GFR calc non Af Amer 58 (L) >60 mL/min   GFR calc Af Amer >60 >60 mL/min    Comment: (NOTE) The eGFR has been calculated using the CKD EPI equation. This calculation has not been validated in all clinical situations. eGFR's persistently <60 mL/min signify possible Chronic Kidney Disease.    Anion gap 10 5 - 15    Comment: Performed at [facility], [place] [address], [place], [place] [address]  CBC     Status: Abnormal   Collection Time: 01/06/18 12:05 PM  Result Value Ref Range   WBC 5.5 4.0 - 10.5 K/uL   RBC 4.88 3.87 - 5.11 MIL/uL   Hemoglobin 11.5 (L) 12.0 -  15.0 g/dL   HCT 36.4 36.0 - 46.0 %   MCV 74.6 (L) 78.0 - 100.0 fL   MCH 23.6 (L) 26.0 - 34.0 pg   MCHC 31.6 30.0 - 36.0 g/dL   RDW 14.6 11.5 - 15.5 %   Platelets 296 150 - 400 K/uL    Comment: Performed at [facility], [place] [address] [place], [place] [address]  Type and screen [place]     Status: None   Collection Time: 01/06/18 12:05 PM  Result Value Ref Range   ABO/RH(D) O POS    Antibody Screen NEG    Sample Expiration      01/09/2018 Performed at [facility], [place] [address], [place], [place] [address]   Urinalysis, Routine w reflex microscopic     Status: Abnormal   Collection Time: 01/06/18  1:14 PM  Result Value Ref Range   Color, Urine YELLOW YELLOW   APPearance HAZY (A) CLEAR   Specific Gravity, Urine 1.026 1.005 - 1.030   pH 5.0 5.0 - 8.0   Glucose, UA NEGATIVE NEGATIVE mg/dL   Hgb urine dipstick LARGE (A) NEGATIVE   Bilirubin Urine NEGATIVE NEGATIVE   Ketones, ur 5 (A) NEGATIVE mg/dL   Protein,  ur 30 (A) NEGATIVE mg/dL   Nitrite NEGATIVE NEGATIVE   Leukocytes, UA NEGATIVE NEGATIVE   RBC / HPF 21-50 0 - 5 RBC/hpf   WBC, UA 6-10 0 - 5 WBC/hpf   Bacteria, UA RARE (A) NONE SEEN   Squamous Epithelial / LPF 0-5 0 - 5   Mucus PRESENT    Hyaline Casts, UA PRESENT    Ca Oxalate Crys, UA PRESENT     Comment: Performed at [facility], [place] [address] [place], [place] [address]  POC occult blood, ED     Status: Abnormal   Collection Time: 01/06/18  2:43 PM  Result Value Ref Range   Fecal Occult Bld POSITIVE (A) NEGATIVE  Hemoglobin     Status: Abnormal   Collection Time: 01/06/18  3:50 PM  Result Value Ref Range   Hemoglobin 10.1 (L) 12.0 - 15.0 g/dL    Comment: Performed at [facility], [place] [address] [place], [place] [address]   Ct Abdomen Pelvis W Contrast  Result Date: 01/06/2018 CLINICAL DATA:  Lower abdominal pain with rectal bleeding and hematuria EXAM: CT ABDOMEN AND PELVIS WITH CONTRAST TECHNIQUE: Multidetector CT imaging of the abdomen and pelvis was performed using the standard protocol following bolus administration of intravenous contrast. CONTRAST:  175m ISOVUE-300 IOPAMIDOL (ISOVUE-300) INJECTION 61% COMPARISON:  None. FINDINGS: Lower chest: No acute abnormality. Hepatobiliary: Gallbladder is been surgically removed. Mild biliary prominence is noted related to the post cholecystectomy state. A small 1 cm cyst is noted within the left lobe of the liver. Pancreas: Unremarkable. No pancreatic ductal dilatation or surrounding inflammatory changes. Spleen: Normal in size without focal abnormality. Adrenals/Urinary Tract: Adrenal glands are unremarkable. Kidneys are normal, without renal calculi, focal lesion, or hydronephrosis. Bladder is unremarkable. Stomach/Bowel: Considerable fecal material is noted within the rectal vault consistent with a mild fecal impaction diverticular change of the colon is noted. No obstructive changes are seen. The  appendix is within normal limits. Vascular/Lymphatic: No significant vascular findings are present. No enlarged abdominal or pelvic lymph nodes. Reproductive: Status post hysterectomy. No adnexal masses. Other: No abdominal wall hernia or abnormality. No abdominopelvic ascites. Musculoskeletal: Mild degenerative changes of the lumbar spine are noted. IMPRESSION: Changes consistent with  fecal impaction in the rectum. Chronic changes as described above. Electronically Signed   By: Inez Catalina M.D.   On: 01/06/2018 14:09    Pending Labs Unresulted Labs (From admission, onward)   Start     Ordered   01/07/18 0375  Basic metabolic panel  Tomorrow morning,   R     01/06/18 1550   01/07/18 0500  CBC  Tomorrow morning,   R     01/06/18 1550   01/06/18 2200  Hemoglobin and hematocrit, blood  Once,   R     01/06/18 1614   01/06/18 1600  Hemoglobin  Now then every 8 hours,   R     01/06/18 1538   01/06/18 1551  HIV antibody (Routine Testing)  Once,   R     01/06/18 1550      Vitals/Pain Today's Vitals   01/06/18 1511 01/06/18 1515 01/06/18 1530 01/06/18 1600  BP:   (!) 142/73 123/68  Pulse: 75 79 75 69  Resp: '17 15 14 13  ' Temp:      TempSrc:      SpO2: 100% 100% 100% 97%  Weight:      Height:      PainSc:        Isolation Precautions No active isolations  Medications Medications  morphine 2 MG/ML injection 2 mg (has no administration in time range)  ondansetron (ZOFRAN) injection 4 mg (has no administration in time range)  famotidine (PEPCID) IVPB 20 mg premix (has no administration in time range)  losartan (COZAAR) tablet 50 mg (has no administration in time range)  hydrochlorothiazide (HYDRODIURIL) tablet 25 mg (has no administration in time range)  pravastatin (PRAVACHOL) tablet 20 mg (has no administration in time range)  potassium chloride SA (K-DUR,KLOR-CON) CR tablet 40 mEq (has no administration in time range)  mineral oil enema 1 enema (has no administration in time range)   polyethylene glycol (MIRALAX / GLYCOLAX) packet 17 g (has no administration in time range)  sodium chloride 0.9 % bolus 1,000 mL (0 mLs Intravenous Stopped 01/06/18 1503)  ondansetron (ZOFRAN) injection 4 mg (4 mg Intravenous Given 01/06/18 1316)  morphine 4 MG/ML injection 4 mg (4 mg Intravenous Given 01/06/18 1317)  iopamidol (ISOVUE-300) 61 % injection 100 mL (100 mLs Intravenous Contrast Given 01/06/18 1324)  iopamidol (ISOVUE-300) 61 % injection (  Contrast Given 01/06/18 1418)  HYDROmorphone (DILAUDID) injection 1 mg (1 mg Intravenous Given 01/06/18 1416)    Mobility walks with person assist

## 2018-01-06 NOTE — Progress Notes (Signed)
ACUTE VISIT   HPI:  Chief Complaint  Patient presents with  . Leg Pain    bilateral leg pain  . Blood In Stools    started this morming    [name] is a 62 y.o. female, who is here today complaining of bilateral LE pain. She has had problem for a year or so,getting worse.   She has described pain as "shock" going down her thighs.  Exacerbated by bending over, achy like pain, intermittent, sometimes it can be 10/10. She has had problem for over a year but it is becoming more frequent.  No back pain,saddle anesthesia,or urine/bowel incontinence.  No numbness,tingling,or weakness. States that she has "to focus on my walking." She has tried Cymbalta and Gabapentin,neither one help.  Rectal bleeding: While she was waiting to be seen today she had 2 episodes of rectal bleeding.  According to patient, she has no prior history of rectal bleed. States that she thought she was going to pass gas but instead it was a gosh of blood.  She took p[ictures and there is red bright blood in toilet.  Hx of constipation. Last bowel movement today,normal consistency.  Last colonoscopy in 09/2016, internal hemorrhoids and diverticulosis.  Polypectomy x 2. Family history for colon cancer negative. She mentions that her brother precancer polyp found during colonoscopy.  Denies abdominal pain, nausea, vomiting, or changes in bowel habits.  No fever,chills, vaginal bleeding or discharge, or abnormal wt loss.    LE edema for at least a year.  It is worse at the end of the day. No erythema or skin changes. She wonders if Losartan may be causing edema.  She is not checking BP at home. No orthopnea,PND,chest pain,or dyspnea.    Vit D deficiency: She is also asking if she is supposed to be on vit D supplementation. Last 25 OH vit D in 09/2017 was 6.5. Ergocalciferol 50,000 U weekly was recommended. States that she did not receive Rx at her pharmacy,she is not sure  if it is covered.   Review of Systems  Constitutional: Negative for activity change, appetite change, fatigue, fever and unexpected weight change.  HENT: Negative for mouth sores, nosebleeds and trouble swallowing.   Eyes: Negative for redness and visual disturbance.  Respiratory: Negative for cough, shortness of breath and wheezing.   Cardiovascular: Negative for chest pain, palpitations and leg swelling.  Gastrointestinal: Positive for anal bleeding, blood in stool and constipation. Negative for abdominal pain, nausea and vomiting.       Negative for changes in bowel habits.  Genitourinary: Negative for decreased urine volume, dysuria, hematuria and vaginal bleeding.  Musculoskeletal: Positive for myalgias. Negative for back pain and gait problem.  Skin: Negative for pallor and rash.  Neurological: Negative for syncope, weakness, numbness and headaches.  Psychiatric/Behavioral: Negative for confusion. The patient is nervous/anxious.       No current facility-administered medications on file prior to visit.    Current Outpatient Medications on File Prior to Visit  Medication Sig Dispense Refill  . aspirin 81 MG tablet Take 81 mg by mouth every morning.     Marland Kitchen esomeprazole (NEXIUM) 40 MG capsule Take 1 capsule (40 mg total) by mouth daily. 90 capsule 3  . losartan (COZAAR) 50 MG tablet Take 1 tablet (50 mg total) by mouth daily. 90 tablet 1  . naproxen (NAPROSYN) 500 MG tablet Take 1 tablet (500 mg total) by mouth daily as needed. 30 tablet 2  . pravastatin (  PRAVACHOL) 20 MG tablet Take 1 tablet (20 mg total) by mouth daily. 90 tablet 1  . tiZANidine (ZANAFLEX) 4 MG tablet Take 0.5-1 tablets (2-4 mg total) by mouth at bedtime. (Patient taking differently: Take 2-4 mg by mouth as needed. ) 30 tablet 1     Past Medical History:  Diagnosis Date  . Arthritis   . Constipation    on stool softener  . GERD (gastroesophageal reflux disease)   . Hyperlipidemia   . Hypertension   .  Trouble swallowing 2013   following lap band   No Known Allergies  Social History   Socioeconomic History  . Marital status: Married    Spouse name: Not on file  . Number of children: 1  . Years of education: Not on file  . Highest education level: Not on file  Occupational History  . Not on file  Social Needs  . Financial resource strain: Not on file  . Food insecurity:    Worry: Not on file    Inability: Not on file  . Transportation needs:    Medical: Not on file    Non-medical: Not on file  Tobacco Use  . Smoking status: Former Smoker    Types: Cigarettes    Last attempt to quit: 10/02/1972    Years since quitting: 45.2  . Smokeless tobacco: Never Used  Substance and Sexual Activity  . Alcohol use: No  . Drug use: No  . Sexual activity: Never  Lifestyle  . Physical activity:    Days per week: Not on file    Minutes per session: Not on file  . Stress: Not on file  Relationships  . Social connections:    Talks on phone: Not on file    Gets together: Not on file    Attends religious service: Not on file    Active member of club or organization: Not on file    Attends meetings of clubs or organizations: Not on file    Relationship status: Not on file  Other Topics Concern  . Not on file  Social History Narrative  . Not on file    Vitals:   01/06/18 0736  BP: 140/88  Pulse: 82  Resp: 12  Temp: 98.5 F (36.9 C)  SpO2: 97%   Body mass index is 44.15 kg/m.   Physical Exam  Nursing note and vitals reviewed. Constitutional: She is oriented to person, place, and time. She appears well-developed. No distress.  HENT:  Head: Normocephalic and atraumatic.  Mouth/Throat: Oropharynx is clear and moist and mucous membranes are normal.  Eyes: Pupils are equal, round, and reactive to light. Conjunctivae are normal.  Cardiovascular: Normal rate and regular rhythm.  No murmur heard. Pulses:      Dorsalis pedis pulses are 2+ on the right side, and 2+ on the left  side.  Respiratory: Effort normal and breath sounds normal. No respiratory distress.  GI: Soft. She exhibits no mass. There is no hepatomegaly. There is no tenderness.  Genitourinary:  Genitourinary Comments: Refused rectal exam.  Musculoskeletal: She exhibits edema (2+ pitting LE edema,bilateral.).       Lumbar back: She exhibits no tenderness and no bony tenderness.       Right upper leg: She exhibits tenderness.       Left upper leg: She exhibits tenderness.  Mild pain on posterior aspect of thighs and calves elicited with LE extension.   Lymphadenopathy:    She has no cervical adenopathy.  Neurological: She is  alert and oriented to person, place, and time. She has normal strength. Gait normal.  Reflex Scores:      Patellar reflexes are 2+ on the right side and 2+ on the left side. Skin: Skin is warm. No rash noted. No erythema.  Psychiatric: She has a normal mood and affect.  Well groomed, good eye contact.    ASSESSMENT AND PLAN:   [name] was seen today for leg pain and blood in stools.  Diagnoses and all orders for this visit:  Bright red rectal bleeding  She refused rectal exam, she feels like she will start bleeding and she would not be able to control. She was clearly instructed about warning signs. GI referral placed, hopefully she can be seen today or tomorrow. CBC was done today but we will need to repeat tomorrow morning.  -     CBC with Differential/Platelet -     Ambulatory referral to Gastroenterology  Vitamin D deficiency, unspecified  Ergocalciferol 50,000 units is not covered by her insurance, so recommend OTC vitamin D 10,000 units daily. We will plan on rechecking in 4 months.  Hypertension, essential, benign  BP slightly elevated. HCTZ 25 mg added. No changes in losartan 50 mg daily. Continue low-salt diet. Follow-up in 5 to 6 weeks.  -     hydrochlorothiazide (HYDRODIURIL) 25 MG tablet; Take 1 tablet (25 mg total) by mouth  daily.  Pain in both lower extremities  Lower extremity possible causes discussed. ? Hamstring tightness.  She is not interested in going to PT because of cost. I did show her some exercises that may help.  ? Radicular pain also to consider.  -     CK  Bilateral lower extremity edema  Chronic problem. ?  Vein disease. Compression stockings may help. HCTZ 25 mg daily added.     Return in about 5 weeks (around 02/10/2018) for HTN and LE edema .     -[name] was advised to seek immediate medical attention if sudden worsening symptoms.     Jennifer Davila G. Martinique, MD  [facility]. [place] office.

## 2018-01-07 ENCOUNTER — Telehealth: Payer: Self-pay | Admitting: Family Medicine

## 2018-01-07 ENCOUNTER — Other Ambulatory Visit: Payer: Self-pay

## 2018-01-07 DIAGNOSIS — K922 Gastrointestinal hemorrhage, unspecified: Secondary | ICD-10-CM | POA: Diagnosis not present

## 2018-01-07 LAB — CBC
HCT: 30.7 % — ABNORMAL LOW (ref 36.0–46.0)
Hemoglobin: 9.6 g/dL — ABNORMAL LOW (ref 12.0–15.0)
MCH: 23.5 pg — ABNORMAL LOW (ref 26.0–34.0)
MCHC: 31.3 g/dL (ref 30.0–36.0)
MCV: 75.1 fL — ABNORMAL LOW (ref 78.0–100.0)
Platelets: 246 10*3/uL (ref 150–400)
RBC: 4.09 MIL/uL (ref 3.87–5.11)
RDW: 14.7 % (ref 11.5–15.5)
WBC: 5.9 10*3/uL (ref 4.0–10.5)

## 2018-01-07 LAB — VITAMIN B12: Vitamin B-12: 260 pg/mL (ref 180–914)

## 2018-01-07 LAB — PROTIME-INR
INR: 1.03
Prothrombin Time: 13.4 seconds (ref 11.4–15.2)

## 2018-01-07 LAB — IRON AND TIBC
Iron: 173 ug/dL — ABNORMAL HIGH (ref 28–170)
Saturation Ratios: 48 % — ABNORMAL HIGH (ref 10.4–31.8)
TIBC: 360 ug/dL (ref 250–450)
UIBC: 187 ug/dL

## 2018-01-07 LAB — HEMOGLOBIN
Hemoglobin: 10 g/dL — ABNORMAL LOW (ref 12.0–15.0)
Hemoglobin: 9.6 g/dL — ABNORMAL LOW (ref 12.0–15.0)

## 2018-01-07 LAB — RETICULOCYTES
RBC.: 4.28 MIL/uL (ref 3.87–5.11)
Retic Count, Absolute: 47.1 10*3/uL (ref 19.0–186.0)
Retic Ct Pct: 1.1 % (ref 0.4–3.1)

## 2018-01-07 LAB — BASIC METABOLIC PANEL
Anion gap: 8 (ref 5–15)
BUN: 15 mg/dL (ref 6–20)
CO2: 28 mmol/L (ref 22–32)
Calcium: 8.2 mg/dL — ABNORMAL LOW (ref 8.9–10.3)
Chloride: 106 mmol/L (ref 101–111)
Creatinine, Ser: 0.86 mg/dL (ref 0.44–1.00)
GFR calc Af Amer: >60 mL/min (ref >60)
GFR calc non Af Amer: >60 mL/min (ref >60)
Glucose, Bld: 96 mg/dL (ref 65–99)
Potassium: 3.3 mmol/L — ABNORMAL LOW (ref 3.5–5.1)
Sodium: 142 mmol/L (ref 135–145)

## 2018-01-07 LAB — FERRITIN: Ferritin: 43 ng/mL (ref 11–307)

## 2018-01-07 LAB — FOLATE: Folate: 11.1 ng/mL (ref >5.9)

## 2018-01-07 LAB — HEMOGLOBIN AND HEMATOCRIT, BLOOD
HCT: 31.9 % — ABNORMAL LOW (ref 36.0–46.0)
Hemoglobin: 9.8 g/dL — ABNORMAL LOW (ref 12.0–15.0)

## 2018-01-07 MED ORDER — ACETAMINOPHEN 80 MG PO CHEW
160.0000 mg | CHEWABLE_TABLET | Freq: Four times a day (QID) | ORAL | Status: DC | PRN
  Administered 2018-01-07 – 2018-01-13 (×6): 160 mg via ORAL
  Filled 2018-01-07 (×8): qty 2

## 2018-01-07 MED ORDER — POTASSIUM CHLORIDE CRYS ER 20 MEQ PO TBCR
40.0000 meq | EXTENDED_RELEASE_TABLET | Freq: Once | ORAL | Status: AC
  Administered 2018-01-07: 40 meq via ORAL
  Filled 2018-01-07: qty 2

## 2018-01-07 MED ORDER — PEG-KCL-NACL-NASULF-NA ASC-C 100 G PO SOLR
0.5000 | Freq: Once | ORAL | Status: AC
  Administered 2018-01-07: 100 g via ORAL
  Filled 2018-01-07: qty 1

## 2018-01-07 NOTE — Progress Notes (Signed)
Pt so far has had 3 bowel movements since taking the go prep.  They have all been bloody.

## 2018-01-07 NOTE — Progress Notes (Addendum)
Progress Note   Subjective  Chief Complaint: Bright red blood per rectum, ABLA  This morning patient found laying in bed, tells me she feels "weak and tired" and has a headache. Per nursing, O2 levels have been dropping occasionally and she is currently on O2 via [place] at 2 L.  Continues with b/l lower abdominal pain which has decreased some today per patient. Last bloody BM was at 7:00 pm after enema yesterday. No further BM since, though patient tells me when she wipes after urinating she does see some brb on the TP when she wipes "back there". No nausea or syncope.    Objective   Vital signs in last 24 hours: Temp:  [97.6 F (36.4 C)-98.3 F (36.8 C)] 98.1 F (36.7 C) (06/11 0607) Pulse Rate:  [69-82] 74 (06/11 0607) Resp:  [13-18] 18 (06/11 0607) BP: (118-146)/(67-92) 123/67 (06/11 0607) SpO2:  [97 %-100 %] 100 % (06/11 0607) Weight:  [240 lb (108.9 kg)] 240 lb (108.9 kg) (06/10 1113) Last BM Date: 01/06/18 General:   Overweight AA female in NAD Heart:  Regular rate and rhythm; no murmurs Lungs: Respirations even and unlabored, lungs CTA bilaterally Abdomen:  Soft, mild b/l lower abdominal ttp and nondistended. Normal bowel sounds. Extremities:  Without edema. Neurologic:  Alert and oriented,  grossly normal neurologically. Psych:  Cooperative. Normal mood and affect.  Intake/Output from previous day: 06/10 0701 - 06/11 0700 In: 1410 [P.O.:360; IV Piggyback:1050] Out: 450 [Urine:450]  Lab Results: Recent Labs    01/06/18 0845 01/06/18 1205  01/06/18 2148 01/06/18 2348 01/07/18 0434 01/07/18 0753  WBC 4.2 5.5  --   --   --  5.9  --   HGB 11.7* 11.5*   < > 9.8* 9.6* 9.6* 10.0*  HCT 36.6 36.4  --  31.1*  --  30.7*  --   PLT 263.0 296  --   --   --  246  --    < > = values in this interval not displayed.   BMET Recent Labs    01/06/18 1205 01/07/18 0434  NA 144 142  K 3.4* 3.3*  CL 107 106  CO2 27 28  GLUCOSE 131* 96  BUN 17 15  CREATININE 1.00 0.86    CALCIUM 8.8* 8.2*   LFT Recent Labs    01/06/18 1205  PROT 7.2  ALBUMIN 4.2  AST 18  ALT 18  ALKPHOS 103  BILITOT 0.7   PT/INR Recent Labs    01/07/18 0753  LABPROT 13.4  INR 1.03    Studies/Results: Ct Abdomen Pelvis W Contrast  Result Date: 01/06/2018 CLINICAL DATA:  Lower abdominal pain with rectal bleeding and hematuria EXAM: CT ABDOMEN AND PELVIS WITH CONTRAST TECHNIQUE: Multidetector CT imaging of the abdomen and pelvis was performed using the standard protocol following bolus administration of intravenous contrast. CONTRAST:  139mL ISOVUE-300 IOPAMIDOL (ISOVUE-300) INJECTION 61% COMPARISON:  None. FINDINGS: Lower chest: No acute abnormality. Hepatobiliary: Gallbladder is been surgically removed. Mild biliary prominence is noted related to the post cholecystectomy state. A small 1 cm cyst is noted within the left lobe of the liver. Pancreas: Unremarkable. No pancreatic ductal dilatation or surrounding inflammatory changes. Spleen: Normal in size without focal abnormality. Adrenals/Urinary Tract: Adrenal glands are unremarkable. Kidneys are normal, without renal calculi, focal lesion, or hydronephrosis. Bladder is unremarkable. Stomach/Bowel: Considerable fecal material is noted within the rectal vault consistent with a mild fecal impaction diverticular change of the colon is noted. No obstructive changes are seen.  The appendix is within normal limits. Vascular/Lymphatic: No significant vascular findings are present. No enlarged abdominal or pelvic lymph nodes. Reproductive: Status post hysterectomy. No adnexal masses. Other: No abdominal wall hernia or abnormality. No abdominopelvic ascites. Musculoskeletal: Mild degenerative changes of the lumbar spine are noted. IMPRESSION: Changes consistent with fecal impaction in the rectum. Chronic changes as described above. Electronically Signed   By: Inez Catalina M.D.   On: 01/06/2018 14:09     Assessment / Plan:   Assessment: 1. Bright  red blood per rectum: last bloody BM at 700pm 01/06/18 after enema in ER, none since, though continues to see some blood when wiping; DDX includes diverticular bleeding, ischemic colitis (not shown on CT), stercoral ulcer, hemorrhoidal etc 2. ABLA: hgb 11.7-->9.6 overnight, pt now requiring O2 via [place]  Plan: 1. Continue Miralax as ordered 2. Increased diet again to clear liquid as patient was made NPO some time overnight 3. Ordered Tylenol 2 tabs q6h for Headache 4. Continue to monitor hgb q8h with transfusion as needed <7 5. Discussed with Dr. Havery Moros, will plan to give 1/2 Movi Prep this morning-pending results may complete other half later today and possible do flex sig tomorrow vs if patient clears out, will likely need no further work up 6. Please await any further recommendations from Dr. Havery Moros later today  Thank you for your kind consultation, we will continue to follow.   LOS: 0 days   Levin Erp  01/07/2018, 8:56 AM  Pager # [phone]

## 2018-01-07 NOTE — Progress Notes (Signed)
Patient's last bloody BM at 19:00 01/06/18. Vitals stable. Complaints of dizziness.

## 2018-01-07 NOTE — Progress Notes (Signed)
[facility] Progress Note  Patient: Jennifer Davila IFO:[id number]   PCP: Martinique, [name], MD DOB: [birth year]   DOA: 01/06/2018   DOS: 01/07/2018   Date of Service: the patient was seen and examined on 01/07/2018  Subjective: Feeling better, no nausea no vomiting no fever no chills.  Still has some blood seen on wiping.  Has some complaint of headache.  Brief hospital course: Pt. with PMH of hypertension, hyperlipidemia, chronic bilateral lower extremity pain, diverticulosis; admitted on 01/06/2018, presented with complaint of blood in the stool, was found to have lower GI bleed. Currently further plan is monitor H&H.  Assessment and Plan: Lower GI bleed: Not mixed with the stool.  Bright red blood per rectum.  Not actively bleeding at present.  Most likely source could be diverticulosis versus internal hemorrhoids.  Patient was also having constipation and CT scan showed fecal impaction the rectum.  Bleeding could also be associated with the stretching of the rectum but patient does not complain of any pain so doubt for this. GI consulted.  Started on clear liquid diet.   Currently her H&H is stable.  Plan is to observe her.  Constipation: Chronic issue.  CT abdomen/pelvis showed fecal impaction in the rectum.  Will order a mineral enema.  We will also start on MiraLAX.  Hypertension: Currently blood pressure stable.  Will resume her home meds.  Hyperlipidemia: Continue statin  GERD: On omeprazole at home.  Currently on famotidine IV.  Hypokalemia: Supplemented with potassium.  Diet: clear liquid diet DVT Prophylaxis: mechanical compression device  Advance goals of care discussion: full code  Family Communication: no family was present at bedside, at the time of interview.  Disposition:  Discharge to home.  Consultants: gastroenterology  Procedures: none  Antibiotics: Anti-infectives (From admission, onward)   None       Objective: Physical Exam: Vitals:    01/06/18 2151 01/07/18 0607 01/07/18 1002 01/07/18 1450  BP: 131/74 123/67 (!) 154/88 (!) 163/95  Pulse: 70 74 81 85  Resp: 18 18  16   Temp: 98.2 F (36.8 C) 98.1 F (36.7 C)  99.5 F (37.5 C)  TempSrc: Oral Oral  Oral  SpO2: 100% 100%  99%  Weight:      Height:        Intake/Output Summary (Last 24 hours) at 01/07/2018 1535 Last data filed at 01/07/2018 1502 Gross per 24 hour  Intake 890 ml  Output 575 ml  Net 315 ml   Filed Weights   01/06/18 1113  Weight: 108.9 kg (240 lb)   General: Alert, Awake and Oriented to Time, Place and Person. Appear in mild distress, affect appropriate Eyes: PERRL, Conjunctiva normal ENT: Oral Mucosa clear moist. Neck: no JVD, no Abnormal Mass Or lumps Cardiovascular: S1 and S2 Present, no Murmur, Peripheral Pulses Present Respiratory: normal respiratory effort, Bilateral Air entry equal and Decreased, no use of accessory muscle, Clear to Auscultation, no Crackles, no wheezes Abdomen: Bowel Sound present, Soft and no tenderness, no hernia Skin: no redness, no Rash, no induration Extremities: no Pedal edema, no calf tenderness Neurologic: Grossly no focal neuro deficit. Bilaterally Equal motor strength  Data Reviewed: CBC: Recent Labs  Lab 01/06/18 0845 01/06/18 1205 01/06/18 1550 01/06/18 2148 01/06/18 2348 01/07/18 0434 01/07/18 0753  WBC 4.2 5.5  --   --   --  5.9  --   NEUTROABS 2.6  --   --   --   --   --   --   HGB  11.7* 11.5* 10.1* 9.8* 9.6* 9.6* 10.0*  HCT 36.6 36.4  --  31.1*  --  30.7*  --   MCV 73.4* 74.6*  --   --   --  75.1*  --   PLT 263.0 296  --   --   --  246  --    Basic Metabolic Panel: Recent Labs  Lab 01/06/18 1205 01/07/18 0434  NA 144 142  K 3.4* 3.3*  CL 107 106  CO2 27 28  GLUCOSE 131* 96  BUN 17 15  CREATININE 1.00 0.86  CALCIUM 8.8* 8.2*    Liver Function Tests: Recent Labs  Lab 01/06/18 1205  AST 18  ALT 18  ALKPHOS 103  BILITOT 0.7  PROT 7.2  ALBUMIN 4.2   No results for  input(s): LIPASE, AMYLASE in the last 168 hours. No results for input(s): AMMONIA in the last 168 hours. Coagulation Profile: Recent Labs  Lab 01/07/18 0753  INR 1.03   Cardiac Enzymes: Recent Labs  Lab 01/06/18 0845  CKTOTAL 249*   BNP (last 3 results) No results for input(s): PROBNP in the last 8760 hours. CBG: No results for input(s): GLUCAP in the last 168 hours. Studies: No results found.  Scheduled Meds: . losartan  50 mg Oral Daily  . polyethylene glycol  17 g Oral BID  . pravastatin  20 mg Oral Daily   Continuous Infusions: . famotidine (PEPCID) IV Stopped (01/07/18 1036)   PRN Meds: acetaminophen, ondansetron (ZOFRAN) IV  Time spent: 35 minutes  Author: Berle Mull, MD [facility] Pager: [phone] 01/07/2018 3:35 PM  If 7PM-7AM, please contact night-coverage at [email or web address], password [facility]

## 2018-01-07 NOTE — Telephone Encounter (Signed)
Copied from [place] [phone]. Topic: General - Other >> Jan 07, 2018 12:07 PM Cecelia Byars, NT wrote: Reason for CRM: The patient called and wanted the practice to know she was admitted to [facility] due to and episode on last night .

## 2018-01-08 ENCOUNTER — Encounter (HOSPITAL_COMMUNITY)
Admission: EM | Disposition: A | Discharge status: Home / Self Care | Payer: Self-pay | Source: Home / Self Care | Attending: Internal Medicine

## 2018-01-08 ENCOUNTER — Encounter (HOSPITAL_COMMUNITY): Payer: Self-pay | Admitting: *Deleted

## 2018-01-08 DIAGNOSIS — K5730 Diverticulosis of large intestine without perforation or abscess without bleeding: Secondary | ICD-10-CM

## 2018-01-08 DIAGNOSIS — K5731 Diverticulosis of large intestine without perforation or abscess with bleeding: Secondary | ICD-10-CM | POA: Diagnosis not present

## 2018-01-08 DIAGNOSIS — K648 Other hemorrhoids: Secondary | ICD-10-CM | POA: Diagnosis not present

## 2018-01-08 DIAGNOSIS — D62 Acute posthemorrhagic anemia: Secondary | ICD-10-CM | POA: Diagnosis not present

## 2018-01-08 DIAGNOSIS — Z6841 Body Mass Index (BMI) 40.0 and over, adult: Secondary | ICD-10-CM

## 2018-01-08 DIAGNOSIS — I10 Essential (primary) hypertension: Secondary | ICD-10-CM | POA: Diagnosis not present

## 2018-01-08 DIAGNOSIS — K922 Gastrointestinal hemorrhage, unspecified: Secondary | ICD-10-CM | POA: Diagnosis not present

## 2018-01-08 DIAGNOSIS — K5900 Constipation, unspecified: Secondary | ICD-10-CM | POA: Diagnosis not present

## 2018-01-08 DIAGNOSIS — E876 Hypokalemia: Secondary | ICD-10-CM | POA: Diagnosis not present

## 2018-01-08 DIAGNOSIS — K219 Gastro-esophageal reflux disease without esophagitis: Secondary | ICD-10-CM | POA: Diagnosis not present

## 2018-01-08 HISTORY — PX: COLONOSCOPY: SHX5424

## 2018-01-08 LAB — CBC WITH DIFFERENTIAL/PLATELET
Basophils Absolute: 0 10*3/uL (ref 0.0–0.1)
Basophils Relative: 0 %
Eosinophils Absolute: 0.2 10*3/uL (ref 0.0–0.7)
Eosinophils Relative: 3 %
HCT: 29.7 % — ABNORMAL LOW (ref 36.0–46.0)
Hemoglobin: 9.2 g/dL — ABNORMAL LOW (ref 12.0–15.0)
Lymphocytes Relative: 29 %
Lymphs Abs: 1.7 10*3/uL (ref 0.7–4.0)
MCH: 23.2 pg — ABNORMAL LOW (ref 26.0–34.0)
MCHC: 31 g/dL (ref 30.0–36.0)
MCV: 75 fL — ABNORMAL LOW (ref 78.0–100.0)
Monocytes Absolute: 0.4 10*3/uL (ref 0.1–1.0)
Monocytes Relative: 7 %
Neutro Abs: 3.5 10*3/uL (ref 1.7–7.7)
Neutrophils Relative %: 61 %
Platelets: 239 10*3/uL (ref 150–400)
RBC: 3.96 MIL/uL (ref 3.87–5.11)
RDW: 14.7 % (ref 11.5–15.5)
WBC: 5.7 10*3/uL (ref 4.0–10.5)

## 2018-01-08 LAB — HEMOGLOBIN AND HEMATOCRIT, BLOOD
HCT: 28.5 % — ABNORMAL LOW (ref 36.0–46.0)
Hemoglobin: 8.9 g/dL — ABNORMAL LOW (ref 12.0–15.0)

## 2018-01-08 LAB — HIV ANTIBODY (ROUTINE TESTING W REFLEX): HIV Screen 4th Generation wRfx: NONREACTIVE

## 2018-01-08 SURGERY — COLONOSCOPY
Anesthesia: Moderate Sedation

## 2018-01-08 MED ORDER — FENTANYL CITRATE (PF) 100 MCG/2ML IJ SOLN
INTRAMUSCULAR | Status: AC
  Filled 2018-01-08: qty 4

## 2018-01-08 MED ORDER — FENTANYL CITRATE (PF) 100 MCG/2ML IJ SOLN
INTRAMUSCULAR | Status: DC | PRN
  Administered 2018-01-08 (×3): 25 ug via INTRAVENOUS

## 2018-01-08 MED ORDER — DIPHENHYDRAMINE HCL 50 MG/ML IJ SOLN
INTRAMUSCULAR | Status: AC
  Filled 2018-01-08: qty 1

## 2018-01-08 MED ORDER — MIDAZOLAM HCL 5 MG/ML IJ SOLN
INTRAMUSCULAR | Status: AC
  Filled 2018-01-08: qty 2

## 2018-01-08 MED ORDER — MIDAZOLAM HCL 5 MG/5ML IJ SOLN
INTRAMUSCULAR | Status: DC | PRN
  Administered 2018-01-08 (×2): 1 mg via INTRAVENOUS
  Administered 2018-01-08 (×2): 2 mg via INTRAVENOUS
  Administered 2018-01-08: 1 mg via INTRAVENOUS

## 2018-01-08 MED ORDER — PANTOPRAZOLE SODIUM 40 MG PO TBEC
40.0000 mg | DELAYED_RELEASE_TABLET | Freq: Every day | ORAL | Status: DC
  Administered 2018-01-08 – 2018-01-14 (×7): 40 mg via ORAL
  Filled 2018-01-08 (×7): qty 1

## 2018-01-08 NOTE — Progress Notes (Signed)
Progress Note   Subjective  Chief Complaint: Bright red blood per rectum, acute blood loss anemia  Today, patient tells me she has completed all of the prep, she did have some big bloody bowel movements overnight but this morning has had 2 more clear movements with no stool and just spots of blood. Continues with b/l lower abdominal pain, again somewhat decreased from previous, remains dizzy with standing.   Objective   Vital signs in last 24 hours: Temp:  [98.8 F (37.1 C)-99.5 F (37.5 C)] 98.8 F (37.1 C) (06/12 0451) Pulse Rate:  [81-89] 83 (06/12 0451) Resp:  [16-18] 18 (06/12 0451) BP: (138-163)/(79-95) 138/79 (06/12 0451) SpO2:  [99 %-100 %] 100 % (06/12 0451) Last BM Date: 01/08/18 General:   Overweight AA female in NAD Heart:  Regular rate and rhythm; no murmurs Lungs: Respirations even and unlabored, lungs CTA bilaterally Abdomen:  Soft, Mild b/l lower abdominal ttp and nondistended. Normal bowel sounds. Extremities:  Without edema. Neurologic:  Alert and oriented,  grossly normal neurologically. Psych:  Cooperative. Normal mood and affect.  Intake/Output from previous day: 06/11 0701 - 06/12 0700 In: 1540 [P.O.:1440; IV Piggyback:100] Out: 1125 [Urine:125; Stool:1000]  Lab Results: Recent Labs    01/06/18 1205  01/07/18 0434 01/07/18 0753 01/07/18 1957 01/08/18 0443  WBC 5.5  --  5.9  --   --  5.7  HGB 11.5*   < > 9.6* 10.0* 9.8* 9.2*  HCT 36.4   < > 30.7*  --  31.9* 29.7*  PLT 296  --  246  --   --  239   < > = values in this interval not displayed.   BMET Recent Labs    01/06/18 1205 01/07/18 0434  NA 144 142  K 3.4* 3.3*  CL 107 106  CO2 27 28  GLUCOSE 131* 96  BUN 17 15  CREATININE 1.00 0.86  CALCIUM 8.8* 8.2*   LFT Recent Labs    01/06/18 1205  PROT 7.2  ALBUMIN 4.2  AST 18  ALT 18  ALKPHOS 103  BILITOT 0.7   PT/INR Recent Labs    01/07/18 0753  LABPROT 13.4  INR 1.03    Studies/Results: Ct Abdomen Pelvis W  Contrast  Result Date: 01/06/2018 CLINICAL DATA:  Lower abdominal pain with rectal bleeding and hematuria EXAM: CT ABDOMEN AND PELVIS WITH CONTRAST TECHNIQUE: Multidetector CT imaging of the abdomen and pelvis was performed using the standard protocol following bolus administration of intravenous contrast. CONTRAST:  141mL ISOVUE-300 IOPAMIDOL (ISOVUE-300) INJECTION 61% COMPARISON:  None. FINDINGS: Lower chest: No acute abnormality. Hepatobiliary: Gallbladder is been surgically removed. Mild biliary prominence is noted related to the post cholecystectomy state. A small 1 cm cyst is noted within the left lobe of the liver. Pancreas: Unremarkable. No pancreatic ductal dilatation or surrounding inflammatory changes. Spleen: Normal in size without focal abnormality. Adrenals/Urinary Tract: Adrenal glands are unremarkable. Kidneys are normal, without renal calculi, focal lesion, or hydronephrosis. Bladder is unremarkable. Stomach/Bowel: Considerable fecal material is noted within the rectal vault consistent with a mild fecal impaction diverticular change of the colon is noted. No obstructive changes are seen. The appendix is within normal limits. Vascular/Lymphatic: No significant vascular findings are present. No enlarged abdominal or pelvic lymph nodes. Reproductive: Status post hysterectomy. No adnexal masses. Other: No abdominal wall hernia or abnormality. No abdominopelvic ascites. Musculoskeletal: Mild degenerative changes of the lumbar spine are noted. IMPRESSION: Changes consistent with fecal impaction in the rectum. Chronic changes as described  above. Electronically Signed   By: Inez Catalina M.D.   On: 01/06/2018 14:09     Assessment / Plan:   Assessment: 1.  Bright red blood per rectum: Bloody bowel movements started 01/06/2018, continued yesterday with bowel prep and through the night, now running clear with some bloody specks; continue to consider diverticular bleeding or stercoral ulcer versus  other 2.  Acute blood loss anemia: Hemoglobin 11.7--> 9.2  Plan: 1.  Patient to remain n.p.o. today until after time of colonoscopy scheduled at 3:00 this afternoon. 2.  Reviewed risks, benefits, limitations and alternatives and the patient agrees to proceed to colonoscopy this afternoon. 3.  Please await any further recommendations from Dr. Havery Moros after procedure.  Thank you for your kind consultation, we will continue to follow.   LOS: 0 days   Levin Erp  01/08/2018, 9:34 AM  Pager # [phone]

## 2018-01-08 NOTE — Progress Notes (Signed)
PROGRESS NOTE    Diannia Hogenson   HQI:[id number]  DOB: Dec 04, 1955  DOA: 01/06/2018 PCP: [place], [name], MD   Brief Narrative:  Jennifer Davila is a 62 y/o with hypertension, hyperlipidemia,chronic bilateral lower extremity pain, diverticulosis; admitted on 01/06/2018, presented with complaint of blood in the stool.   Subjective: She had bright red blood with the colon prep. No abdominal pain.   .   Assessment & Plan:   Principal Problem:   Lower GI bleed - colonoscopy shows diverticulosis but no active bleeding - recommended clear liquid diet for now - cont to follow - will need Nuclear scan if bleeding continues  Active Problems: Acute blood loss anemia - Hb 11.5>> 9.2 - anemia panel consistent with AOCD - following- aspirin on hold    Hip pain   Hypertension, essential, benign - cont Cozaar- HCTZ on hold    Morbid obesity with BMI of 40.0-44.9, adult (HCC) Body mass index is 43.9 kg/m.    GERD (gastroesophageal reflux disease) - add PPI- takes nexium at home    Hypokalemia - replace and follow    Constipation - resolved with colon prep   DVT prophylaxis: SCD Code Status: Full code Family Communication:  Disposition Plan: cont to follow bleeding Consultants:   GI Procedures:   Colonoscopy Antimicrobials:  Anti-infectives (From admission, onward)   None       Objective: Vitals:   01/08/18 1611 01/08/18 1615 01/08/18 1620 01/08/18 1639  BP:  (!) 107/50 (!) 121/54 136/78  Pulse: 85 82 83 81  Resp: 17 16 15 16   Temp:  97.6 F (36.4 C)  98 F (36.7 C)  TempSrc:  Oral  Oral  SpO2: 98% 99% 96% 98%  Weight:      Height:        Intake/Output Summary (Last 24 hours) at 01/08/2018 1646 Last data filed at 01/08/2018 1418 Gross per 24 hour  Intake 1300 ml  Output 1000 ml  Net 300 ml   Filed Weights   01/06/18 1113 01/08/18 1445  Weight: 108.9 kg (240 lb) 108.9 kg (240 lb)    Examination: General exam: Appears  comfortable  HEENT: PERRLA, oral mucosa moist, no sclera icterus or thrush Respiratory system: Clear to auscultation. Respiratory effort normal. Cardiovascular system: S1 & S2 heard, RRR.   Gastrointestinal system: Abdomen soft, non-tender, nondistended. Normal bowel sound. No organomegaly Central nervous system: Alert and oriented. No focal neurological deficits. Extremities: No cyanosis, clubbing or edema Skin: No rashes or ulcers Psychiatry:  Mood & affect appropriate.     Data Reviewed: I have personally reviewed following labs and imaging studies  CBC: Recent Labs  Lab 01/06/18 0845 01/06/18 1205  01/06/18 2148 01/06/18 2348 01/07/18 0434 01/07/18 0753 01/07/18 1957 01/08/18 0443  WBC 4.2 5.5  --   --   --  5.9  --   --  5.7  NEUTROABS 2.6  --   --   --   --   --   --   --  3.5  HGB 11.7* 11.5*   < > 9.8* 9.6* 9.6* 10.0* 9.8* 9.2*  HCT 36.6 36.4  --  31.1*  --  30.7*  --  31.9* 29.7*  MCV 73.4* 74.6*  --   --   --  75.1*  --   --  75.0*  PLT 263.0 296  --   --   --  246  --   --  239   < > = values in this  interval not displayed.   Basic Metabolic Panel: Recent Labs  Lab 01/06/18 1205 01/07/18 0434  NA 144 142  K 3.4* 3.3*  CL 107 106  CO2 27 28  GLUCOSE 131* 96  BUN 17 15  CREATININE 1.00 0.86  CALCIUM 8.8* 8.2*   GFR: Estimated Creatinine Clearance: 76.8 mL/min (by C-G formula based on SCr of 0.86 mg/dL). Liver Function Tests: Recent Labs  Lab 01/06/18 1205  AST 18  ALT 18  ALKPHOS 103  BILITOT 0.7  PROT 7.2  ALBUMIN 4.2   No results for input(s): LIPASE, AMYLASE in the last 168 hours. No results for input(s): AMMONIA in the last 168 hours. Coagulation Profile: Recent Labs  Lab 01/07/18 0753  INR 1.03   Cardiac Enzymes: Recent Labs  Lab 01/06/18 0845  CKTOTAL 249*   BNP (last 3 results) No results for input(s): PROBNP in the last 8760 hours. HbA1C: No results for input(s): HGBA1C in the last 72 hours. CBG: No results for input(s):  GLUCAP in the last 168 hours. Lipid Profile: No results for input(s): CHOL, HDL, LDLCALC, TRIG, CHOLHDL, LDLDIRECT in the last 72 hours. Thyroid Function Tests: No results for input(s): TSH, T4TOTAL, FREET4, T3FREE, THYROIDAB in the last 72 hours. Anemia Panel: Recent Labs    01/07/18 0753  VITAMINB12 260  FOLATE 11.1  FERRITIN 43  TIBC 360  IRON 173*  RETICCTPCT 1.1   Urine analysis:    Component Value Date/Time   COLORURINE YELLOW 01/06/2018 1314   APPEARANCEUR HAZY (A) 01/06/2018 1314   LABSPEC 1.026 01/06/2018 1314   PHURINE 5.0 01/06/2018 1314   GLUCOSEU NEGATIVE 01/06/2018 1314   HGBUR LARGE (A) 01/06/2018 1314   BILIRUBINUR NEGATIVE 01/06/2018 1314   KETONESUR 5 (A) 01/06/2018 1314   PROTEINUR 30 (A) 01/06/2018 1314   UROBILINOGEN 1.0 04/27/2015 1655   NITRITE NEGATIVE 01/06/2018 1314   LEUKOCYTESUR NEGATIVE 01/06/2018 1314   Sepsis Labs: @LABRCNTIP (procalcitonin:4,lacticidven:4) )No results found for this or any previous visit (from the past 240 hour(s)).       Radiology Studies: No results found.    Scheduled Meds: . losartan  50 mg Oral Daily  . polyethylene glycol  17 g Oral BID  . pravastatin  20 mg Oral Daily   Continuous Infusions: . famotidine (PEPCID) IV Stopped (01/08/18 0944)     LOS: 0 days    Time spent in minutes: 35    Debbe Odea, MD [facility] Pager: [email or web address] Password [facility] 01/08/2018, 4:46 PM

## 2018-01-08 NOTE — Plan of Care (Signed)
Pt alert and oriented, resting with complaints of dizziness and weakness with standing.  Plan to have colonoscopy at 1500 01/08/18.  RN will monitor.

## 2018-01-08 NOTE — Progress Notes (Signed)
Pt had large bloody BM prior to being transported to Endoscopy.  GI PA made aware.  Pt in stable condition. Taken to Endoscopy.

## 2018-01-08 NOTE — Op Note (Signed)
[facility] Patient Name: Jennifer Davila Procedure Date: 01/08/2018 MRN: [id number] Attending MD: Carlota Raspberry. Havery Moros , MD Date of Birth: Nov 28, 1955 CSN: [id number] Age: 62 Admit Type: Inpatient Procedure:                Colonoscopy Indications:              Hematochezia Providers:                Remo Lipps P. Havery Moros, MD, Cleda Daub, RN,                            Laurena Spies, Technician Referring MD:              Medicines:                Fentanyl 75 micrograms IV, Midazolam 7 mg IV Complications:            No immediate complications. Estimated blood loss:                            Minimal. Estimated Blood Loss:     Estimated blood loss was minimal. Procedure:                Pre-Anesthesia Assessment:                           - Prior to the procedure, a History and Physical                            was performed, and patient medications and                            allergies were reviewed. The patient's tolerance of                            previous anesthesia was also reviewed. The risks                            and benefits of the procedure and the sedation                            options and risks were discussed with the patient.                            All questions were answered, and informed consent                            was obtained. Prior Anticoagulants: The patient has                            taken no previous anticoagulant or antiplatelet                            agents. ASA Grade Assessment: III - A patient with  severe systemic disease. After reviewing the risks                            and benefits, the patient was deemed in                            satisfactory condition to undergo the procedure.                           After obtaining informed consent, the colonoscope                            was passed under direct vision. Throughout the                            procedure, the  patient's blood pressure, pulse, and                            oxygen saturations were monitored continuously. The                            EC-[id number] ([id number]) scope was introduced through                            the anus and advanced to the the terminal ileum,                            with identification of the appendiceal orifice and                            IC valve. The colonoscopy was performed without                            difficulty. The patient tolerated the procedure                            well. The quality of the bowel preparation was                            fair. The terminal ileum, ileocecal valve,                            appendiceal orifice, and rectum were photographed. Scope In: 3:40:01 PM Scope Out: 4:07:54 PM Scope Withdrawal Time: 0 hours 22 minutes 28 seconds  Total Procedure Duration: 0 hours 27 minutes 53 seconds  Findings:      The perianal and digital rectal examinations were normal.      The terminal ileum appeared normal.      Red blood with clots was found in the entire colon.      Multiple medium-mouthed diverticula were found in the entire colon,       highest burden in sigmoid colon and ascending.      Internal hemorrhoids were found during retroflexion.      Several minutes were spent lavaging the colon and diverticuli. I could  not appreciate any active bleeding following lavage. I did not       appreciate any other pathology other than diverticulosis to cause       bleeding, although clot burden limited visualization in some areas. The       exam was otherwise without abnormality. Impression:               - Preparation of the colon was fair.                           - The examined portion of the ileum was normal.                           - Blood / clots in the entire examined colon.                           - Diverticulosis in the entire examined colon.                           - Internal hemorrhoids.                            - Time spent lavaging the colon - no active                            bleeding appreciated.                           Overall I suspect diverticular bleeding although                            could not identify which portion of the colon was                            bleeding, possibly ascending given distribution of                            blood. Moderate Sedation:      Moderate (conscious) sedation was administered by the endoscopy nurse       and supervised by the endoscopist. The patient's oxygen saturation,       heart rate, blood pressure and response to care were monitored. Total       physician intraservice time was 35 minutes. Recommendation:           - Return patient to hospital ward for ongoing care.                           - Clear liquid diet.                           - Continue present medications.                           - Repeat Hgb now, transfuse PRBC to goal > 7                           -  No active bleeding noted during colonoscopy                            (unless extremely slow bleed not appreciated). If                            it recurs / persists recommend tagged RBC scan or                            CT angio to further evaluate. Please contact GI on                            call overnight if bleeding recurs                           - GI service will continue to follow Procedure Code(s):        --- Professional ---                           [phone], Colonoscopy, flexible; diagnostic, including                            collection of specimen(s) by brushing or washing,                            when performed (separate procedure)                           99152, 59, Moderate sedation services provided by                            the same physician or other qualified health care                            professional performing the diagnostic or                            therapeutic service that the sedation supports,                             requiring the presence of an independent trained                            observer to assist in the monitoring of the                            patient's level of consciousness and physiological                            status; initial 15 minutes of intraservice time,                            patient age 73 years or older  99153, Moderate sedation services provided by the                            same physician or other qualified health care                            professional performing the diagnostic or                            therapeutic service that the sedation supports,                            requiring the presence of an independent trained                            observer to assist in the monitoring of the                            patient's level of consciousness and physiological                            status; each additional 15 minutes intraservice                            time (List separately in addition to code for                            primary service) Diagnosis Code(s):        --- Professional ---                           K64.8, Other hemorrhoids                           K92.2, Gastrointestinal hemorrhage, unspecified                           K92.1, Melena (includes Hematochezia)                           K57.30, Diverticulosis of large intestine without                            perforation or abscess without bleeding CPT copyright 2017 [employer]. All rights reserved. The codes documented in this report are preliminary and upon coder review may  be revised to meet current compliance requirements. Remo Lipps P. Havery Moros, MD 01/08/2018 4:20:28 PM This report has been signed electronically. Number of Addenda: 0

## 2018-01-08 NOTE — Interval H&P Note (Signed)
History and Physical Interval Note:  01/08/2018 3:26 PM  Jennifer Davila  has presented today for surgery, with the diagnosis of lower gi bleed  The various methods of treatment have been discussed with the patient and family. After consideration of risks, benefits and other options for treatment, the patient has consented to  Procedure(s): COLONOSCOPY (N/A) as a surgical intervention .  The patient's history has been reviewed, patient examined, no change in status, stable for surgery.  I have reviewed the patient's chart and labs.  Questions were answered to the patient's satisfaction.     S[place]ia

## 2018-01-08 NOTE — Evaluation (Signed)
Physical Therapy Evaluation Patient Details Name: Jennifer Davila MRN: [id number] DOB: [birth year] Today's Date: 01/08/2018   History of Present Illness  62 yo female was admitted for GI bleed with weakness and notable hematuria, fecal impaction.  MD is having colonoscopy today, suspected diverticular bleed.  PMHx:  LE pain, HTN, diverticulosis, obesity, caregiver to husband  Clinical Impression  Pt was seen for evaluation of tolerance for gait and exercise, noted BP sitting to be 158/85 with pulse 84, standing was 154/93 and pulse 84.  Pt is having normal O2 sats of 98-99% in all postures and will be continuing to work on progressing mobility given her previous independence.  Focus of acute therapy will be endurance of standing and tolerance OOB in chair.  Follow for these to transition to home therapy.    Follow Up Recommendations Home health PT;Supervision for mobility/OOB    Equipment Recommendations  Rolling walker with 5" wheels    Recommendations for Other Services       Precautions / Restrictions Precautions Precautions: Fall Precaution Comments: weak but BP and O2 sats are stable in standing Restrictions Weight Bearing Restrictions: No      Mobility  Bed Mobility Overal bed mobility: Needs Assistance Bed Mobility: Supine to Sit;Sit to Supine     Supine to sit: Supervision Sit to supine: Supervision      Transfers Overall transfer level: Needs assistance Equipment used: Rolling walker (2 wheeled);1 person hand held assist Transfers: Sit to/from Stand Sit to Stand: Min guard         General transfer comment: minor help to stand up and steady  Ambulation/Gait Ambulation/Gait assistance: Min guard Ambulation Distance (Feet): 18 Feet Assistive device: Rolling walker (2 wheeled);1 person hand held assist Gait Pattern/deviations: Step-through pattern;Decreased stride length;Narrow base of support;Trunk flexed Gait velocity: reduced Gait velocity  interpretation: <1.31 ft/sec, indicative of household ambulator General Gait Details: pt became weaker in appearance at the end of the walk and turned to sit on side of bed  Stairs            Wheelchair Mobility    Modified Rankin (Stroke Patients Only)       Balance Overall balance assessment: Needs assistance Sitting-balance support: Feet supported Sitting balance-Leahy Scale: Good     Standing balance support: Bilateral upper extremity supported;During functional activity Standing balance-Leahy Scale: Fair Standing balance comment: less than fair to transition to stand                             Pertinent Vitals/Pain Pain Assessment: 0-10 Pain Score: 7  Pain Location: abdomen Pain Descriptors / Indicators: Tender Pain Intervention(s): Limited activity within patient's tolerance;Monitored during session;Repositioned    Home Living Family/patient expects to be discharged to:: Private residence Living Arrangements: Spouse/significant other Available Help at Discharge: Family;Available 24 hours/day Type of Home: House Home Access: Stairs to enter Entrance Stairs-Rails: Right Entrance Stairs-Number of Steps: 6 Home Layout: One level Home Equipment: None Additional Comments: son is there but in evening, husband is cared for by wife    Prior Function Level of Independence: Independent               Hand Dominance   Dominant Hand: Right    Extremity/Trunk Assessment   Upper Extremity Assessment Upper Extremity Assessment: Overall WFL for tasks assessed    Lower Extremity Assessment Lower Extremity Assessment: Overall WFL for tasks assessed    Cervical / Trunk Assessment Cervical / Trunk  Assessment: Normal  Communication   Communication: No difficulties  Cognition Arousal/Alertness: Awake/alert Behavior During Therapy: WFL for tasks assessed/performed Overall Cognitive Status: Within Functional Limits for tasks assessed                                         General Comments      Exercises     Assessment/Plan    PT Assessment Patient needs continued PT services  PT Problem List Decreased activity tolerance;Decreased balance;Decreased mobility;Decreased knowledge of use of DME;Decreased safety awareness;Obesity;Pain       PT Treatment Interventions DME instruction;Gait training;Stair training;Functional mobility training;Therapeutic activities;Therapeutic exercise;Balance training;Neuromuscular re-education;Patient/family education    PT Goals (Current goals can be found in the Care Plan section)  Acute Rehab PT Goals Patient Stated Goal: to get stronger and feel better PT Goal Formulation: With patient Time For Goal Achievement: 01/22/18 Potential to Achieve Goals: Good    Frequency Min 3X/week   Barriers to discharge Decreased caregiver support;Inaccessible home environment home as husband's caregiver and with stairs to enter house    Co-evaluation               AM-PAC PT "6 Clicks" Daily Activity  Outcome Measure Difficulty turning over in bed (including adjusting bedclothes, sheets and blankets)?: A Little Difficulty moving from lying on back to sitting on the side of the bed? : A Little Difficulty sitting down on and standing up from a chair with arms (e.g., wheelchair, bedside commode, etc,.)?: Unable Help needed moving to and from a bed to chair (including a wheelchair)?: A Little Help needed walking in hospital room?: A Little Help needed climbing 3-5 steps with a railing? : A Lot 6 Click Score: 15    End of Session Equipment Utilized During Treatment: Gait belt Activity Tolerance: Patient tolerated treatment well;Patient limited by fatigue;Other (comment)(feeling weak but BP and sats were good in standing) Patient left: in bed;with call bell/phone within reach;with nursing/sitter in room(awaiting colonoscopy) Nurse Communication: Mobility status PT Visit Diagnosis:  Unsteadiness on feet (R26.81);Difficulty in walking, not elsewhere classified (R26.2);Dizziness and giddiness (R42);Pain Pain - Right/Left: (central) Pain - part of body: (abdomen)    Time: 1324-4010 PT Time Calculation (min) (ACUTE ONLY): 19 min   Charges:   PT Evaluation $PT Eval Moderate Complexity: 1 Mod     PT G Codes:   PT G-Codes **NOT FOR INPATIENT CLASS** Functional Assessment Tool Used: AM-PAC 6 Clicks Basic Mobility    Ramond Dial 01/08/2018, 12:15 PM   Mee Hives, PT MS Acute Rehab Dept. Number: [place] and [place]

## 2018-01-08 NOTE — H&P (View-Only) (Signed)
Progress Note   Subjective  Chief Complaint: Bright red blood per rectum, acute blood loss anemia  Today, patient tells me she has completed all of the prep, she did have some big bloody bowel movements overnight but this morning has had 2 more clear movements with no stool and just spots of blood. Continues with b/l lower abdominal pain, again somewhat decreased from previous, remains dizzy with standing.   Objective   Vital signs in last 24 hours: Temp:  [98.8 F (37.1 C)-99.5 F (37.5 C)] 98.8 F (37.1 C) (06/12 0451) Pulse Rate:  [81-89] 83 (06/12 0451) Resp:  [16-18] 18 (06/12 0451) BP: (138-163)/(79-95) 138/79 (06/12 0451) SpO2:  [99 %-100 %] 100 % (06/12 0451) Last BM Date: 01/08/18 General:   Overweight AA female in NAD Heart:  Regular rate and rhythm; no murmurs Lungs: Respirations even and unlabored, lungs CTA bilaterally Abdomen:  Soft, Mild b/l lower abdominal ttp and nondistended. Normal bowel sounds. Extremities:  Without edema. Neurologic:  Alert and oriented,  grossly normal neurologically. Psych:  Cooperative. Normal mood and affect.  Intake/Output from previous day: 06/11 0701 - 06/12 0700 In: 1540 [P.O.:1440; IV Piggyback:100] Out: 1125 [Urine:125; Stool:1000]  Lab Results: Recent Labs    01/06/18 1205  01/07/18 0434 01/07/18 0753 01/07/18 1957 01/08/18 0443  WBC 5.5  --  5.9  --   --  5.7  HGB 11.5*   < > 9.6* 10.0* 9.8* 9.2*  HCT 36.4   < > 30.7*  --  31.9* 29.7*  PLT 296  --  246  --   --  239   < > = values in this interval not displayed.   BMET Recent Labs    01/06/18 1205 01/07/18 0434  NA 144 142  K 3.4* 3.3*  CL 107 106  CO2 27 28  GLUCOSE 131* 96  BUN 17 15  CREATININE 1.00 0.86  CALCIUM 8.8* 8.2*   LFT Recent Labs    01/06/18 1205  PROT 7.2  ALBUMIN 4.2  AST 18  ALT 18  ALKPHOS 103  BILITOT 0.7   PT/INR Recent Labs    01/07/18 0753  LABPROT 13.4  INR 1.03    Studies/Results: Ct Abdomen Pelvis W  Contrast  Result Date: 01/06/2018 CLINICAL DATA:  Lower abdominal pain with rectal bleeding and hematuria EXAM: CT ABDOMEN AND PELVIS WITH CONTRAST TECHNIQUE: Multidetector CT imaging of the abdomen and pelvis was performed using the standard protocol following bolus administration of intravenous contrast. CONTRAST:  167mL ISOVUE-300 IOPAMIDOL (ISOVUE-300) INJECTION 61% COMPARISON:  None. FINDINGS: Lower chest: No acute abnormality. Hepatobiliary: Gallbladder is been surgically removed. Mild biliary prominence is noted related to the post cholecystectomy state. A small 1 cm cyst is noted within the left lobe of the liver. Pancreas: Unremarkable. No pancreatic ductal dilatation or surrounding inflammatory changes. Spleen: Normal in size without focal abnormality. Adrenals/Urinary Tract: Adrenal glands are unremarkable. Kidneys are normal, without renal calculi, focal lesion, or hydronephrosis. Bladder is unremarkable. Stomach/Bowel: Considerable fecal material is noted within the rectal vault consistent with a mild fecal impaction diverticular change of the colon is noted. No obstructive changes are seen. The appendix is within normal limits. Vascular/Lymphatic: No significant vascular findings are present. No enlarged abdominal or pelvic lymph nodes. Reproductive: Status post hysterectomy. No adnexal masses. Other: No abdominal wall hernia or abnormality. No abdominopelvic ascites. Musculoskeletal: Mild degenerative changes of the lumbar spine are noted. IMPRESSION: Changes consistent with fecal impaction in the rectum. Chronic changes as described  above. Electronically Signed   By: Inez Catalina M.D.   On: 01/06/2018 14:09     Assessment / Plan:   Assessment: 1.  Bright red blood per rectum: Bloody bowel movements started 01/06/2018, continued yesterday with bowel prep and through the night, now running clear with some bloody specks; continue to consider diverticular bleeding or stercoral ulcer versus  other 2.  Acute blood loss anemia: Hemoglobin 11.7--> 9.2  Plan: 1.  Patient to remain n.p.o. today until after time of colonoscopy scheduled at 3:00 this afternoon. 2.  Reviewed risks, benefits, limitations and alternatives and the patient agrees to proceed to colonoscopy this afternoon. 3.  Please await any further recommendations from Dr. Havery Moros after procedure.  Thank you for your kind consultation, we will continue to follow.   LOS: 0 days   Levin Erp  01/08/2018, 9:34 AM  Pager # [phone]

## 2018-01-09 DIAGNOSIS — K922 Gastrointestinal hemorrhage, unspecified: Secondary | ICD-10-CM | POA: Diagnosis not present

## 2018-01-09 DIAGNOSIS — K5731 Diverticulosis of large intestine without perforation or abscess with bleeding: Principal | ICD-10-CM

## 2018-01-09 DIAGNOSIS — D62 Acute posthemorrhagic anemia: Secondary | ICD-10-CM

## 2018-01-09 DIAGNOSIS — K5730 Diverticulosis of large intestine without perforation or abscess without bleeding: Secondary | ICD-10-CM | POA: Diagnosis not present

## 2018-01-09 LAB — CBC
HCT: 27 % — ABNORMAL LOW (ref 36.0–46.0)
HCT: 25.4 % — ABNORMAL LOW (ref 36.0–46.0)
Hemoglobin: 8.6 g/dL — ABNORMAL LOW (ref 12.0–15.0)
Hemoglobin: 8 g/dL — ABNORMAL LOW (ref 12.0–15.0)
MCH: 24 pg — ABNORMAL LOW (ref 26.0–34.0)
MCH: 23.7 pg — ABNORMAL LOW (ref 26.0–34.0)
MCHC: 31.9 g/dL (ref 30.0–36.0)
MCHC: 31.5 g/dL (ref 30.0–36.0)
MCV: 75.2 fL — ABNORMAL LOW (ref 78.0–100.0)
MCV: 75.4 fL — ABNORMAL LOW (ref 78.0–100.0)
Platelets: 232 10*3/uL (ref 150–400)
Platelets: 272 10*3/uL (ref 150–400)
RBC: 3.59 MIL/uL — ABNORMAL LOW (ref 3.87–5.11)
RBC: 3.37 MIL/uL — ABNORMAL LOW (ref 3.87–5.11)
RDW: 14.5 % (ref 11.5–15.5)
RDW: 14.5 % (ref 11.5–15.5)
WBC: 5.9 10*3/uL (ref 4.0–10.5)
WBC: 6 10*3/uL (ref 4.0–10.5)

## 2018-01-09 LAB — COMPREHENSIVE METABOLIC PANEL
ALT: 15 U/L (ref 14–54)
AST: 15 U/L (ref 15–41)
Albumin: 3.4 g/dL — ABNORMAL LOW (ref 3.5–5.0)
Alkaline Phosphatase: 90 U/L (ref 38–126)
Anion gap: 9 (ref 5–15)
BUN: 8 mg/dL (ref 6–20)
CO2: 24 mmol/L (ref 22–32)
Calcium: 8.4 mg/dL — ABNORMAL LOW (ref 8.9–10.3)
Chloride: 108 mmol/L (ref 101–111)
Creatinine, Ser: 0.87 mg/dL (ref 0.44–1.00)
GFR calc Af Amer: >60 mL/min (ref >60)
GFR calc non Af Amer: >60 mL/min (ref >60)
Glucose, Bld: 108 mg/dL — ABNORMAL HIGH (ref 65–99)
Potassium: 3.1 mmol/L — ABNORMAL LOW (ref 3.5–5.1)
Sodium: 141 mmol/L (ref 135–145)
Total Bilirubin: 0.7 mg/dL (ref 0.3–1.2)
Total Protein: 5.8 g/dL — ABNORMAL LOW (ref 6.5–8.1)

## 2018-01-09 MED ORDER — ALPRAZOLAM 0.25 MG PO TABS
0.2500 mg | ORAL_TABLET | Freq: Every evening | ORAL | Status: DC | PRN
  Administered 2018-01-09: 0.25 mg via ORAL
  Filled 2018-01-09: qty 1

## 2018-01-09 MED ORDER — SODIUM CHLORIDE 0.9 % IV BOLUS
500.0000 mL | Freq: Once | INTRAVENOUS | Status: AC
  Administered 2018-01-09: 500 mL via INTRAVENOUS

## 2018-01-09 NOTE — Progress Notes (Addendum)
     [facility] Progress Note  CC:  GI bleed  Subjective:  Feels better.  No further BM or passing of blood since colonoscopy.  Hgb down some to 8.6 grams this AM.  Objective:  Vital signs in last 24 hours: Temp:  [97.6 F (36.4 C)-98.7 F (37.1 C)] 98.4 F (36.9 C) (06/13 0506) Pulse Rate:  [71-91] 81 (06/13 0506) Resp:  [12-23] 13 (06/13 0506) BP: (101-181)/(47-94) 150/78 (06/13 0506) SpO2:  [96 %-100 %] 100 % (06/13 0506) Weight:  [240 lb (108.9 kg)] 240 lb (108.9 kg) (06/12 1445) Last BM Date: 01/08/18 General:  Alert, Well-developed, in NAD Heart:  Regular rate and rhythm; no murmurs Pulm:  CTAB.  No increased WOB. Abdomen:  Soft, non-distended.  BS present.  Non-tender. Extremities:  Trace edema in B/L LE's. Neurologic:  Alert and oriented x 4;  grossly normal neurologically. Psych:  Alert and cooperative. Normal mood and affect.  Intake/Output from previous day: 06/12 0701 - 06/13 0700 In: 590 [P.O.:540; IV Piggyback:50] Out: -   Lab Results: Recent Labs    01/07/18 0434  01/08/18 0443 01/08/18 1644 01/09/18 0637  WBC 5.9  --  5.7  --  5.9  HGB 9.6*   < > 9.2* 8.9* 8.6*  HCT 30.7*   < > 29.7* 28.5* 27.0*  PLT 246  --  239  --  232   < > = values in this interval not displayed.   BMET Recent Labs    01/06/18 1205 01/07/18 0434 01/09/18 0637  NA 144 142 141  K 3.4* 3.3* 3.1*  CL 107 106 108  CO2 27 28 24   GLUCOSE 131* 96 108*  BUN 17 15 8   CREATININE 1.00 0.86 0.87  CALCIUM 8.8* 8.2* 8.4*   LFT Recent Labs    01/09/18 0637  PROT 5.8*  ALBUMIN 3.4*  AST 15  ALT 15  ALKPHOS 90  BILITOT 0.7   PT/INR Recent Labs    01/07/18 0753  LABPROT 13.4  INR 1.03   Assessment / Plan: *LGIB:  Suspect diverticular in origin.  No bleeding source found on colonoscopy 6/12, but blood clots and diverticula found throughout the colon.  No further bleeding since colonoscopy. *ABLA:  Hgb trending down slightly, at 8.6 grams this AM.  No  transfusion at this point.  -Will advance to full liquids. -Is on Miralax daily here and that should be continued upon discharge. -Monitor Hgb and transfuse if needed. -Would likely continue to monitor for another 24 hours, but if all stable in AM then can likely be discharged tomorrow.   LOS: 0 days   Laban Emperor. Laiba Fuerte  01/09/2018, 9:13 AM

## 2018-01-09 NOTE — Plan of Care (Signed)
Pt alert and oriented, resting with no complaints this am. Still feels a little weak, but ambulating to and from bathroom and OOB to chair.  Complaints of abd pain and reports no bloody BM's overnight. RN will monitor.

## 2018-01-09 NOTE — Progress Notes (Signed)
Received order from oncall NP Bodenheimer to give 500 ml bolus NS & stat CBC. Administered IV fluid NS 522ml  Bolus at 2221 . After completed  bolus IV fluid ,pt said she feel  good. And requesting something to help her to sleep & relax.  Paged oncall again. Received order for xanax 0.25mg  PRN. Administered xanax to pt. She is resting now. Stat CBC result came back. No critical. Oncall notified. Hgb-8. Will continue to monitor.

## 2018-01-09 NOTE — Progress Notes (Signed)
Pt complaint of dizziness after she had big bloody stool . Paged oncall. Waiting for oncall to response . Will continue to monitor.

## 2018-01-09 NOTE — Progress Notes (Signed)
PROGRESS NOTE    Jennifer Davila   FGH:[id number]  DOB: Mar 26, 1956  DOA: 01/06/2018 PCP: Martinique, [name], MD   Brief Narrative:  Jennifer Davila is a 62 y/o with hypertension, hyperlipidemia,chronic bilateral lower extremity pain, diverticulosis; admitted on 01/06/2018, presented with complaint of blood in the stool.   Subjective: She has not had any stools since prior to the colonoscopy yesterday. No abdominal pain. Drinking clears without trouble.  .   Assessment & Plan:   Principal Problem:   Lower GI bleed - colonoscopy shows diverticulosis but no active bleeding - cont to follow - will need Nuclear scan if bleeding continues - advanced to full liquids today and solids tomorrow morning  Active Problems: Acute blood loss anemia - Hb 11.5>> 9.2>>8.6 - anemia panel consistent with AOCD - following- aspirin on hold    Hypertension, essential, benign - cont Cozaar- HCTZ on hold    Morbid obesity with BMI of 40.0-44.9, adult   Body mass index is 43.9 kg/m.    GERD (gastroesophageal reflux disease) - add PPI- takes nexium at home    Hypokalemia - replace and follow    Constipation - resolved with colon prep   DVT prophylaxis: SCD Code Status: Full code Family Communication:  Disposition Plan: cont to follow bleeding Consultants:   GI Procedures:   Colonoscopy Antimicrobials:  Anti-infectives (From admission, onward)   None       Objective: Vitals:   01/08/18 1844 01/08/18 2116 01/09/18 0506 01/09/18 1322  BP: 126/65 (!) 159/89 (!) 150/78 135/76  Pulse: 71 82 81 91  Resp: 14 14 13 18   Temp: 98.1 F (36.7 C) 98.5 F (36.9 C) 98.4 F (36.9 C) 98.6 F (37 C)  TempSrc: Oral Oral Oral Oral  SpO2: 100% 100% 100% 100%  Weight:      Height:        Intake/Output Summary (Last 24 hours) at 01/09/2018 1347 Last data filed at 01/09/2018 1330 Gross per 24 hour  Intake 710 ml  Output -  Net 710 ml   Filed Weights   01/06/18 1113  01/08/18 1445  Weight: 108.9 kg (240 lb) 108.9 kg (240 lb)    Examination: General exam: Appears comfortable  HEENT: PERRLA, oral mucosa moist, no sclera icterus or thrush Respiratory system: Clear to auscultation. Respiratory effort normal. Cardiovascular system: S1 & S2 heard, RRR.   Gastrointestinal system: Abdomen soft, non-tender, nondistended. Normal bowel sound. No organomegaly Central nervous system: Alert and oriented. No focal neurological deficits. Extremities: No cyanosis, clubbing or edema Skin: No rashes or ulcers Psychiatry:  Mood & affect appropriate.   Data Reviewed: I have personally reviewed following labs and imaging studies  CBC: Recent Labs  Lab 01/06/18 0845 01/06/18 1205  01/07/18 0434 01/07/18 0753 01/07/18 1957 01/08/18 0443 01/08/18 1644 01/09/18 0637  WBC 4.2 5.5  --  5.9  --   --  5.7  --  5.9  NEUTROABS 2.6  --   --   --   --   --  3.5  --   --   HGB 11.7* 11.5*   < > 9.6* 10.0* 9.8* 9.2* 8.9* 8.6*  HCT 36.6 36.4   < > 30.7*  --  31.9* 29.7* 28.5* 27.0*  MCV 73.4* 74.6*  --  75.1*  --   --  75.0*  --  75.2*  PLT 263.0 296  --  246  --   --  239  --  232   < > = values  in this interval not displayed.   Basic Metabolic Panel: Recent Labs  Lab 01/06/18 1205 01/07/18 0434 01/09/18 0637  NA 144 142 141  K 3.4* 3.3* 3.1*  CL 107 106 108  CO2 27 28 24   GLUCOSE 131* 96 108*  BUN 17 15 8   CREATININE 1.00 0.86 0.87  CALCIUM 8.8* 8.2* 8.4*   GFR: Estimated Creatinine Clearance: 75.9 mL/min (by C-G formula based on SCr of 0.87 mg/dL). Liver Function Tests: Recent Labs  Lab 01/06/18 1205 01/09/18 0637  AST 18 15  ALT 18 15  ALKPHOS 103 90  BILITOT 0.7 0.7  PROT 7.2 5.8*  ALBUMIN 4.2 3.4*   No results for input(s): LIPASE, AMYLASE in the last 168 hours. No results for input(s): AMMONIA in the last 168 hours. Coagulation Profile: Recent Labs  Lab 01/07/18 0753  INR 1.03   Cardiac Enzymes: Recent Labs  Lab 01/06/18 0845    CKTOTAL 249*   BNP (last 3 results) No results for input(s): PROBNP in the last 8760 hours. HbA1C: No results for input(s): HGBA1C in the last 72 hours. CBG: No results for input(s): GLUCAP in the last 168 hours. Lipid Profile: No results for input(s): CHOL, HDL, LDLCALC, TRIG, CHOLHDL, LDLDIRECT in the last 72 hours. Thyroid Function Tests: No results for input(s): TSH, T4TOTAL, FREET4, T3FREE, THYROIDAB in the last 72 hours. Anemia Panel: Recent Labs    01/07/18 0753  VITAMINB12 260  FOLATE 11.1  FERRITIN 43  TIBC 360  IRON 173*  RETICCTPCT 1.1   Urine analysis:    Component Value Date/Time   COLORURINE YELLOW 01/06/2018 1314   APPEARANCEUR HAZY (A) 01/06/2018 1314   LABSPEC 1.026 01/06/2018 1314   PHURINE 5.0 01/06/2018 1314   GLUCOSEU NEGATIVE 01/06/2018 1314   HGBUR LARGE (A) 01/06/2018 1314   BILIRUBINUR NEGATIVE 01/06/2018 1314   KETONESUR 5 (A) 01/06/2018 1314   PROTEINUR 30 (A) 01/06/2018 1314   UROBILINOGEN 1.0 04/27/2015 1655   NITRITE NEGATIVE 01/06/2018 1314   LEUKOCYTESUR NEGATIVE 01/06/2018 1314   Sepsis Labs: @LABRCNTIP (procalcitonin:4,lacticidven:4) )No results found for this or any previous visit (from the past 240 hour(s)).       Radiology Studies: No results found.    Scheduled Meds: . losartan  50 mg Oral Daily  . pantoprazole  40 mg Oral Daily  . polyethylene glycol  17 g Oral BID  . pravastatin  20 mg Oral Daily   Continuous Infusions: . famotidine (PEPCID) IV Stopped (01/09/18 1009)     LOS: 0 days    Time spent in minutes: 35    Debbe Odea, MD [facility] Pager: [email or web address] Password [email or web address] 01/09/2018, 1:47 PM

## 2018-01-10 ENCOUNTER — Inpatient Hospital Stay (HOSPITAL_COMMUNITY): Payer: BLUE CROSS/BLUE SHIELD

## 2018-01-10 ENCOUNTER — Encounter (HOSPITAL_COMMUNITY): Payer: Self-pay | Admitting: Gastroenterology

## 2018-01-10 DIAGNOSIS — Z9049 Acquired absence of other specified parts of digestive tract: Secondary | ICD-10-CM | POA: Diagnosis not present

## 2018-01-10 DIAGNOSIS — R710 Precipitous drop in hematocrit: Secondary | ICD-10-CM

## 2018-01-10 DIAGNOSIS — I10 Essential (primary) hypertension: Secondary | ICD-10-CM | POA: Diagnosis present

## 2018-01-10 DIAGNOSIS — K5731 Diverticulosis of large intestine without perforation or abscess with bleeding: Secondary | ICD-10-CM | POA: Diagnosis present

## 2018-01-10 DIAGNOSIS — Z90710 Acquired absence of both cervix and uterus: Secondary | ICD-10-CM | POA: Diagnosis not present

## 2018-01-10 DIAGNOSIS — M79605 Pain in left leg: Secondary | ICD-10-CM | POA: Diagnosis present

## 2018-01-10 DIAGNOSIS — Z6841 Body Mass Index (BMI) 40.0 and over, adult: Secondary | ICD-10-CM | POA: Diagnosis not present

## 2018-01-10 DIAGNOSIS — K5900 Constipation, unspecified: Secondary | ICD-10-CM | POA: Diagnosis not present

## 2018-01-10 DIAGNOSIS — D62 Acute posthemorrhagic anemia: Secondary | ICD-10-CM | POA: Diagnosis present

## 2018-01-10 DIAGNOSIS — Z8249 Family history of ischemic heart disease and other diseases of the circulatory system: Secondary | ICD-10-CM | POA: Diagnosis not present

## 2018-01-10 DIAGNOSIS — E785 Hyperlipidemia, unspecified: Secondary | ICD-10-CM | POA: Diagnosis present

## 2018-01-10 DIAGNOSIS — Z7982 Long term (current) use of aspirin: Secondary | ICD-10-CM | POA: Diagnosis not present

## 2018-01-10 DIAGNOSIS — G8929 Other chronic pain: Secondary | ICD-10-CM | POA: Diagnosis present

## 2018-01-10 DIAGNOSIS — E876 Hypokalemia: Secondary | ICD-10-CM | POA: Diagnosis present

## 2018-01-10 DIAGNOSIS — M25559 Pain in unspecified hip: Secondary | ICD-10-CM | POA: Diagnosis present

## 2018-01-10 DIAGNOSIS — K219 Gastro-esophageal reflux disease without esophagitis: Secondary | ICD-10-CM | POA: Diagnosis present

## 2018-01-10 DIAGNOSIS — K625 Hemorrhage of anus and rectum: Secondary | ICD-10-CM | POA: Diagnosis present

## 2018-01-10 DIAGNOSIS — D638 Anemia in other chronic diseases classified elsewhere: Secondary | ICD-10-CM | POA: Diagnosis present

## 2018-01-10 DIAGNOSIS — K648 Other hemorrhoids: Secondary | ICD-10-CM | POA: Diagnosis present

## 2018-01-10 DIAGNOSIS — M1990 Unspecified osteoarthritis, unspecified site: Secondary | ICD-10-CM | POA: Diagnosis present

## 2018-01-10 DIAGNOSIS — M79604 Pain in right leg: Secondary | ICD-10-CM | POA: Diagnosis present

## 2018-01-10 DIAGNOSIS — Z79899 Other long term (current) drug therapy: Secondary | ICD-10-CM | POA: Diagnosis not present

## 2018-01-10 DIAGNOSIS — K922 Gastrointestinal hemorrhage, unspecified: Secondary | ICD-10-CM | POA: Diagnosis not present

## 2018-01-10 DIAGNOSIS — Z87891 Personal history of nicotine dependence: Secondary | ICD-10-CM | POA: Diagnosis not present

## 2018-01-10 DIAGNOSIS — Z9884 Bariatric surgery status: Secondary | ICD-10-CM | POA: Diagnosis not present

## 2018-01-10 DIAGNOSIS — Z8371 Family history of colonic polyps: Secondary | ICD-10-CM | POA: Diagnosis not present

## 2018-01-10 LAB — CBC
HCT: 22.5 % — ABNORMAL LOW (ref 36.0–46.0)
Hemoglobin: 7.1 g/dL — ABNORMAL LOW (ref 12.0–15.0)
MCH: 23.9 pg — ABNORMAL LOW (ref 26.0–34.0)
MCHC: 31.6 g/dL (ref 30.0–36.0)
MCV: 75.8 fL — ABNORMAL LOW (ref 78.0–100.0)
Platelets: 253 10*3/uL (ref 150–400)
RBC: 2.97 MIL/uL — ABNORMAL LOW (ref 3.87–5.11)
RDW: 14.7 % (ref 11.5–15.5)
WBC: 5.9 10*3/uL (ref 4.0–10.5)

## 2018-01-10 LAB — PREPARE RBC (CROSSMATCH)

## 2018-01-10 MED ORDER — SODIUM CHLORIDE 0.9 % IV SOLN
Freq: Once | INTRAVENOUS | Status: AC
  Administered 2018-01-10: 23:00:00 via INTRAVENOUS

## 2018-01-10 MED ORDER — POTASSIUM CHLORIDE IN NACL 40-0.9 MEQ/L-% IV SOLN
INTRAVENOUS | Status: DC
  Administered 2018-01-10 – 2018-01-13 (×9): 125 mL/h via INTRAVENOUS
  Filled 2018-01-10 (×12): qty 1000

## 2018-01-10 MED ORDER — GERHARDT'S BUTT CREAM
TOPICAL_CREAM | Freq: Three times a day (TID) | CUTANEOUS | Status: DC | PRN
  Filled 2018-01-10: qty 1

## 2018-01-10 MED ORDER — SODIUM PERTECHNETATE TC 99M INJECTION
27.8000 | Freq: Once | INTRAVENOUS | Status: AC | PRN
  Administered 2018-01-10: 27.8 via INTRAVENOUS

## 2018-01-10 MED ORDER — POTASSIUM CHLORIDE CRYS ER 20 MEQ PO TBCR
40.0000 meq | EXTENDED_RELEASE_TABLET | ORAL | Status: AC
  Administered 2018-01-10 (×2): 40 meq via ORAL
  Filled 2018-01-10 (×2): qty 2

## 2018-01-10 NOTE — Progress Notes (Addendum)
PROGRESS NOTE    Jennifer Davila   ATF:[id number]  DOB: [birth year]  DOA: 01/06/2018 PCP: [place], [name], MD   Brief Narrative:  Jennifer Davila is a 62 y/o with hypertension, hyperlipidemia,chronic bilateral lower extremity pain, diverticulosis; admitted on 01/06/2018, presented with complaint of blood in the stool.   Subjective: She had 2 bloody bowel movements yesterday.  One was around 6 PM and dissecting around 11:49 PM.  She is not having any abdominal pain or nausea.  .   Assessment & Plan:   Principal Problem:   Lower GI bleed - colonoscopy shows diverticulosis but no active bleeding - cont to follow - will need Nuclear scan if bleeding continues - advanced to full liquids today and solids tomorrow morning -6/14-due to 2 bloody bowel movements yesterday, her diet has been changed back to n.p.o. except meds, IV fluids resumed and a nuclear bleeding scan has been ordered - Addendum: bleeding scan is negative  Active Problems: Acute blood loss anemia - Hb 11.5>> 9.2>>8.6>> 8.0 - anemia panel consistent with AOCD - following- aspirin on hold    Hypertension, essential, benign -Due to ongoing bleeding we will hold Cozaar today to prevent hypotension- HCTZ on hold    Morbid obesity with BMI of 40.0-44.9, adult   Body mass index is 43.9 kg/m.    GERD (gastroesophageal reflux disease) - added PPI- takes nexium at home    Hypokalemia - replace again and follow    Constipation - resolved with colon prep   DVT prophylaxis: SCD Code Status: Full code Family Communication:  Disposition Plan: cont to follow bleeding and hemoglobin-follow-up on bleeding scan Consultants:   GI Procedures:   Colonoscopy Antimicrobials:  Anti-infectives (From admission, onward)   None       Objective: Vitals:   01/09/18 1322 01/09/18 1959 01/09/18 2200 01/10/18 0634  BP: 135/76 (!) 167/90 140/70 133/80  Pulse: 91 93 93 92  Resp: 18 18 18 18   Temp: 98.6 F  (37 C) 99.4 F (37.4 C) 98.3 F (36.8 C) 98.4 F (36.9 C)  TempSrc: Oral Oral Oral Oral  SpO2: 100% 100% 99% 100%  Weight:      Height:        Intake/Output Summary (Last 24 hours) at 01/10/2018 1153 Last data filed at 01/10/2018 0600 Gross per 24 hour  Intake 1210.2 ml  Output 601 ml  Net 609.2 ml   Filed Weights   01/06/18 1113 01/08/18 1445  Weight: 108.9 kg (240 lb) 108.9 kg (240 lb)    Examination: General exam: Appears comfortable  HEENT: PERRLA, oral mucosa moist, no sclera icterus or thrush Respiratory system: Clear to auscultation. Respiratory effort normal. Cardiovascular system: S1 & S2 heard, RRR.   Gastrointestinal system: Abdomen soft, non-tender, nondistended. Normal bowel sound. No organomegaly Central nervous system: Alert and oriented. No focal neurological deficits. Extremities: No cyanosis, clubbing or edema Skin: No rashes or ulcers Psychiatry:  Mood & affect appropriate.   Data Reviewed: I have personally reviewed following labs and imaging studies  CBC: Recent Labs  Lab 01/06/18 0845 01/06/18 1205  01/07/18 0434  01/07/18 1957 01/08/18 0443 01/08/18 1644 01/09/18 0637 01/09/18 2230  WBC 4.2 5.5  --  5.9  --   --  5.7  --  5.9 6.0  NEUTROABS 2.6  --   --   --   --   --  3.5  --   --   --   HGB 11.7* 11.5*   < > 9.6*   < >  9.8* 9.2* 8.9* 8.6* 8.0*  HCT 36.6 36.4   < > 30.7*  --  31.9* 29.7* 28.5* 27.0* 25.4*  MCV 73.4* 74.6*  --  75.1*  --   --  75.0*  --  75.2* 75.4*  PLT 263.0 296  --  246  --   --  239  --  232 272   < > = values in this interval not displayed.   Basic Metabolic Panel: Recent Labs  Lab 01/06/18 1205 01/07/18 0434 01/09/18 0637  NA 144 142 141  K 3.4* 3.3* 3.1*  CL 107 106 108  CO2 27 28 24   GLUCOSE 131* 96 108*  BUN 17 15 8   CREATININE 1.00 0.86 0.87  CALCIUM 8.8* 8.2* 8.4*   GFR: Estimated Creatinine Clearance: 75.9 mL/min (by C-G formula based on SCr of 0.87 mg/dL). Liver Function Tests: Recent Labs    Lab 01/06/18 1205 01/09/18 0637  AST 18 15  ALT 18 15  ALKPHOS 103 90  BILITOT 0.7 0.7  PROT 7.2 5.8*  ALBUMIN 4.2 3.4*   No results for input(s): LIPASE, AMYLASE in the last 168 hours. No results for input(s): AMMONIA in the last 168 hours. Coagulation Profile: Recent Labs  Lab 01/07/18 0753  INR 1.03   Cardiac Enzymes: Recent Labs  Lab 01/06/18 0845  CKTOTAL 249*   BNP (last 3 results) No results for input(s): PROBNP in the last 8760 hours. HbA1C: No results for input(s): HGBA1C in the last 72 hours. CBG: No results for input(s): GLUCAP in the last 168 hours. Lipid Profile: No results for input(s): CHOL, HDL, LDLCALC, TRIG, CHOLHDL, LDLDIRECT in the last 72 hours. Thyroid Function Tests: No results for input(s): TSH, T4TOTAL, FREET4, T3FREE, THYROIDAB in the last 72 hours. Anemia Panel: No results for input(s): VITAMINB12, FOLATE, FERRITIN, TIBC, IRON, RETICCTPCT in the last 72 hours. Urine analysis:    Component Value Date/Time   COLORURINE YELLOW 01/06/2018 1314   APPEARANCEUR HAZY (A) 01/06/2018 1314   LABSPEC 1.026 01/06/2018 1314   PHURINE 5.0 01/06/2018 1314   GLUCOSEU NEGATIVE 01/06/2018 1314   HGBUR LARGE (A) 01/06/2018 1314   BILIRUBINUR NEGATIVE 01/06/2018 1314   KETONESUR 5 (A) 01/06/2018 1314   PROTEINUR 30 (A) 01/06/2018 1314   UROBILINOGEN 1.0 04/27/2015 1655   NITRITE NEGATIVE 01/06/2018 1314   LEUKOCYTESUR NEGATIVE 01/06/2018 1314   Sepsis Labs: @LABRCNTIP (procalcitonin:4,lacticidven:4) )No results found for this or any previous visit (from the past 240 hour(s)).       Radiology Studies: No results found.    Scheduled Meds: . pantoprazole  40 mg Oral Daily  . polyethylene glycol  17 g Oral BID  . potassium chloride  40 mEq Oral Q4H  . pravastatin  20 mg Oral Daily   Continuous Infusions: . 0.9 % NaCl with KCl 40 mEq / L 125 mL/hr (01/10/18 0902)  . famotidine (PEPCID) IV Stopped (01/10/18 0939)     LOS: 0 days    Time  spent in minutes: 35    Debbe Odea, MD [facility] Pager: [email or web address] Password [email or web address] 01/10/2018, 11:53 AM

## 2018-01-10 NOTE — Progress Notes (Signed)
GI MD spoke to me regarding patient's Hgb 7.1 result. He asked for me to page the Hospitalist on call with his recommendation to transfuse 1 unit PRBC. He stated he was staying over and was unable to put in the order. Dr, Silas Sacramento paged.

## 2018-01-10 NOTE — Progress Notes (Signed)
     [facility] Progress Note  CC:  LGIB  Subjective:  Had two episodes of passing blood per rectum yesterday evening/last night, but none today so bleeding scan was negative.  Hgb was down slightly last night at 8.0 grams.  Objective:  Vital signs in last 24 hours: Temp:  [98.3 F (36.8 C)-99.4 F (37.4 C)] 98.4 F (36.9 C) (06/14 0634) Pulse Rate:  [91-93] 92 (06/14 0634) Resp:  [18] 18 (06/14 0634) BP: (133-167)/(70-90) 133/80 (06/14 0634) SpO2:  [99 %-100 %] 100 % (06/14 0634) Last BM Date: 01/09/18 General:  Alert, Well-developed, in NAD Heart:  Regular rate and rhythm; no murmurs Pulm:  CTAB.  No increased WOB. Abdomen:  Soft, non-distended.  BS present.  Non-tender. Extremities:  Without edema. Neurologic:  Alert and oriented x 4;  grossly normal neurologically. Psych:  Alert and cooperative. Normal mood and affect.  Intake/Output from previous day: 06/13 0701 - 06/14 0700 In: 1210.2 [P.O.:610; IV Piggyback:600.2] Out: 601 [Urine:1; Stool:600]  Lab Results: Recent Labs    01/08/18 0443 01/08/18 1644 01/09/18 0637 01/09/18 2230  WBC 5.7  --  5.9 6.0  HGB 9.2* 8.9* 8.6* 8.0*  HCT 29.7* 28.5* 27.0* 25.4*  PLT 239  --  232 272   BMET Recent Labs    01/09/18 0637  NA 141  K 3.1*  CL 108  CO2 24  GLUCOSE 108*  BUN 8  CREATININE 0.87  CALCIUM 8.4*   LFT Recent Labs    01/09/18 0637  PROT 5.8*  ALBUMIN 3.4*  AST 15  ALT 15  ALKPHOS 90  BILITOT 0.7   Assessment / Plan: *LGIB:  Suspect diverticular in origin.  No bleeding source found on colonoscopy 6/12, but blood clots and diverticula found throughout the colon.  Had bleeding again last night, but none today.  NM bleeding scan negative today.  Question if this was active bleeding vs passing of blood that was still in the colon. *ABLA:  Hgb trended down slightly, at 8.0 grams last night.  No transfusion at this point.  ? If this represented active bleeding vs dilution and  equilibration.  -Return diet to full liquids for now. -Is on Miralax daily here and that should be continued upon discharge. -Monitor Hgb and transfuse if needed.    LOS: 0 days   Jennifer Davila. Jennifer Davila  01/10/2018, 9:35 AM

## 2018-01-10 NOTE — Progress Notes (Signed)
PT Cancellation Note  Patient Details Name: Jennifer Davila MRN: [id number] DOB: 08/16/1955   Cancelled Treatment:    Reason Eval/Treat Not Completed: Patient at procedure or test/unavailable. will check back another time/day when schedule allows. thanks.    Weston Anna, MPT Pager: [phone]

## 2018-01-11 LAB — CBC
HCT: 26.7 % — ABNORMAL LOW (ref 36.0–46.0)
HCT: 26.4 % — ABNORMAL LOW (ref 36.0–46.0)
Hemoglobin: 8.5 g/dL — ABNORMAL LOW (ref 12.0–15.0)
Hemoglobin: 8.5 g/dL — ABNORMAL LOW (ref 12.0–15.0)
MCH: 24.8 pg — ABNORMAL LOW (ref 26.0–34.0)
MCH: 25.1 pg — ABNORMAL LOW (ref 26.0–34.0)
MCHC: 31.8 g/dL (ref 30.0–36.0)
MCHC: 32.2 g/dL (ref 30.0–36.0)
MCV: 77.8 fL — ABNORMAL LOW (ref 78.0–100.0)
MCV: 77.9 fL — ABNORMAL LOW (ref 78.0–100.0)
Platelets: 250 10*3/uL (ref 150–400)
Platelets: 256 10*3/uL (ref 150–400)
RBC: 3.43 MIL/uL — ABNORMAL LOW (ref 3.87–5.11)
RBC: 3.39 MIL/uL — ABNORMAL LOW (ref 3.87–5.11)
RDW: 15.9 % — ABNORMAL HIGH (ref 11.5–15.5)
RDW: 16 % — ABNORMAL HIGH (ref 11.5–15.5)
WBC: 7.3 10*3/uL (ref 4.0–10.5)
WBC: 7.4 10*3/uL (ref 4.0–10.5)

## 2018-01-11 LAB — BASIC METABOLIC PANEL
Anion gap: 7 (ref 5–15)
BUN: 15 mg/dL (ref 6–20)
CO2: 24 mmol/L (ref 22–32)
Calcium: 8.5 mg/dL — ABNORMAL LOW (ref 8.9–10.3)
Chloride: 111 mmol/L (ref 101–111)
Creatinine, Ser: 0.94 mg/dL (ref 0.44–1.00)
GFR calc Af Amer: >60 mL/min (ref >60)
GFR calc non Af Amer: >60 mL/min (ref >60)
Glucose, Bld: 99 mg/dL (ref 65–99)
Potassium: 3.7 mmol/L (ref 3.5–5.1)
Sodium: 142 mmol/L (ref 135–145)

## 2018-01-11 LAB — HEMOGLOBIN AND HEMATOCRIT, BLOOD
HCT: 30.4 % — ABNORMAL LOW (ref 36.0–46.0)
Hemoglobin: 10 g/dL — ABNORMAL LOW (ref 12.0–15.0)

## 2018-01-11 MED ORDER — SIMETHICONE 40 MG/0.6ML PO SUSP
40.0000 mg | Freq: Four times a day (QID) | ORAL | Status: DC | PRN
  Administered 2018-01-11: 40 mg via ORAL
  Filled 2018-01-11: qty 0.6

## 2018-01-11 NOTE — Progress Notes (Signed)
Progress Note for Jennifer Davila  Subjective: No complaints.  No further hematochezia.  Objective: Vital signs in last 24 hours: Temp:  [98.2 F (36.8 C)-99 F (37.2 C)] 98.2 F (36.8 C) (06/15 0500) Pulse Rate:  [86-103] 97 (06/15 0500) Resp:  [13-18] 14 (06/15 0500) BP: (123-164)/(67-101) 142/78 (06/15 0500) SpO2:  [99 %-100 %] 99 % (06/15 0500) Last BM Date: 01/11/18  Intake/Output from previous day: 06/14 0701 - 06/15 0700 In: 2829.2 [P.O.:520; I.V.:1599.2; Blood:710] Out: 250 [Urine:250] Intake/Output this shift: Total I/O In: -  Out: 300 [Urine:300]  General appearance: alert and no distress Davila: soft, non-tender; bowel sounds normal; no masses,  no organomegaly  Lab Results: Recent Labs    01/09/18 2230 01/10/18 1740 01/11/18 0345 01/11/18 0805  WBC 6.0 5.9  --  7.3  HGB 8.0* 7.1* 10.0* 8.5*  HCT 25.4* 22.5* 30.4* 26.7*  PLT 272 253  --  250   BMET Recent Labs    01/09/18 0637 01/11/18 0345  NA 141 142  K 3.1* 3.7  CL 108 111  CO2 24 24  GLUCOSE 108* 99  BUN 8 15  CREATININE 0.87 0.94  CALCIUM 8.4* 8.5*   LFT Recent Labs    01/09/18 0637  PROT 5.8*  ALBUMIN 3.4*  AST 15  ALT 15  ALKPHOS 90  BILITOT 0.7   PT/INR No results for input(s): LABPROT, INR in the last 72 hours. Hepatitis Panel No results for input(s): HEPBSAG, HCVAB, HEPAIGM, HEPBIGM in the last 72 hours. C-Diff No results for input(s): CDIFFTOX in the last 72 hours. Fecal Lactopherrin No results for input(s): FECLLACTOFRN in the last 72 hours.  Studies/Results: Nm Davila Blood Loss  Result Date: 01/10/2018 CLINICAL DATA:  Davila bleeding. EXAM: NUCLEAR MEDICINE GASTROINTESTINAL BLEEDING SCAN TECHNIQUE: Sequential abdominal images were obtained for 2 hours following intravenous administration of Tc-42m labeled red blood cells. RADIOPHARMACEUTICALS:  27.8 mCi Tc-71m pertechnetate in-vitro labeled red cells. COMPARISON:  CT scan 01/06/2018 FINDINGS: Planar imaging over 120 minutes shows  no evidence for radiotracer accumulation over the abdomen/pelvis to suggest active Davila bleeding. IMPRESSION: No active Davila bleeding during the 2 hours of observation. Electronically Signed   By: Misty Stanley M.D.   On: 01/10/2018 13:36    Medications:  Scheduled: . pantoprazole  40 mg Oral Daily  . polyethylene glycol  17 g Oral BID  . pravastatin  20 mg Oral Daily   Continuous: . 0.9 % NaCl with KCl 40 mEq / L 125 mL/hr (01/11/18 0130)  . famotidine (PEPCID) IV Stopped (01/10/18 2343)    Assessment/Plan: 1) Diverticular bleed. 2) Anemia.   HGB improved with the blood transfusions.  She denies any further hematochezia.     Plan: 1) Monitor for one more day.  If no further bleeding by tomorrow she can be discharged home. 2) Follow HGB.  LOS: 1 day   Jennifer Davila D 01/11/2018, 9:04 AM

## 2018-01-11 NOTE — Progress Notes (Signed)
PROGRESS NOTE    Jennifer Davila   NID:[id number]  DOB: 05-11-1956  DOA: 01/06/2018 PCP: Martinique, [name], MD   Brief Narrative:  Jennifer Davila is a 62 y/o with hypertension, hyperlipidemia,chronic bilateral lower extremity pain, diverticulosis; admitted on 01/06/2018, presented with complaint of blood in the stool.   Subjective: No BM yesterday or overnight.   .   Assessment & Plan:   Principal Problem:   Lower GI bleed - colonoscopy shows diverticulosis but no active bleeding - cont to follow - will need Nuclear scan if bleeding continues - advanced to full liquids today and solids tomorrow morning -6/14-due to 2 bloody bowel movements yesterday, her diet has been changed back to n.p.o. except meds, IV fluids resumed and a nuclear bleeding scan has been ordered -   bleeding scan - negative- cont to follow for 24 more hrs per GI  Active Problems: Acute blood loss anemia - Hb 11.5>> 9.2>>8.6>> 8.0>> 7.1- given 1 U PRBC overnight- Hb now 8.5 - anemia panel consistent with AOCD - following- aspirin on hold    Hypertension, essential, benign -Due to ongoing bleeding we will hold Cozaar today to prevent hypotension- HCTZ on hold    Morbid obesity with BMI of 40.0-44.9, adult   Body mass index is 43.9 kg/m.    GERD (gastroesophageal reflux disease) - added PPI- takes nexium at home    Hypokalemia - replaced    Constipation - resolved with colon prep   DVT prophylaxis: SCD Code Status: Full code Family Communication:  Disposition Plan: cont to follow bleeding and hemoglobin  Consultants:   GI Procedures:   Colonoscopy Antimicrobials:  Anti-infectives (From admission, onward)   None       Objective: Vitals:   01/10/18 2309 01/11/18 0125 01/11/18 0500 01/11/18 1035  BP: 131/67 123/68 (!) 142/78 (!) 162/94  Pulse: 92 86 97 98  Resp: 17 13 14    Temp: 98.7 F (37.1 C) 98.2 F (36.8 C) 98.2 F (36.8 C)   TempSrc: Oral Oral Oral     SpO2: 100% 100% 99%   Weight:      Height:        Intake/Output Summary (Last 24 hours) at 01/11/2018 1221 Last data filed at 01/11/2018 1000 Gross per 24 hour  Intake 3634.58 ml  Output 550 ml  Net 3084.58 ml   Filed Weights   01/06/18 1113 01/08/18 1445  Weight: 108.9 kg (240 lb) 108.9 kg (240 lb)    Examination: General exam: Appears comfortable  HEENT: PERRLA, oral mucosa moist, no sclera icterus or thrush Respiratory system: Clear to auscultation. Respiratory effort normal. Cardiovascular system: S1 & S2 heard, RRR.   Gastrointestinal system: Abdomen soft, non-tender, nondistended. Normal bowel sound. No organomegaly Central nervous system: Alert and oriented. No focal neurological deficits. Extremities: No cyanosis, clubbing or edema Skin: No rashes or ulcers Psychiatry:  Mood & affect appropriate.   Data Reviewed: I have personally reviewed following labs and imaging studies  CBC: Recent Labs  Lab 01/06/18 0845  01/08/18 0443  01/09/18 0637 01/09/18 2230 01/10/18 1740 01/11/18 0345 01/11/18 0805  WBC 4.2   < > 5.7  --  5.9 6.0 5.9  --  7.3  NEUTROABS 2.6  --  3.5  --   --   --   --   --   --   HGB 11.7*   < > 9.2*   < > 8.6* 8.0* 7.1* 10.0* 8.5*  HCT 36.6   < > 29.7*   < >  27.0* 25.4* 22.5* 30.4* 26.7*  MCV 73.4*   < > 75.0*  --  75.2* 75.4* 75.8*  --  77.8*  PLT 263.0   < > 239  --  232 272 253  --  250   < > = values in this interval not displayed.   Basic Metabolic Panel: Recent Labs  Lab 01/06/18 1205 01/07/18 0434 01/09/18 0637 01/11/18 0345  NA 144 142 141 142  K 3.4* 3.3* 3.1* 3.7  CL 107 106 108 111  CO2 27 28 24 24   GLUCOSE 131* 96 108* 99  BUN 17 15 8 15   CREATININE 1.00 0.86 0.87 0.94  CALCIUM 8.8* 8.2* 8.4* 8.5*   GFR: Estimated Creatinine Clearance: 70.3 mL/min (by C-G formula based on SCr of 0.94 mg/dL). Liver Function Tests: Recent Labs  Lab 01/06/18 1205 01/09/18 0637  AST 18 15  ALT 18 15  ALKPHOS 103 90  BILITOT 0.7  0.7  PROT 7.2 5.8*  ALBUMIN 4.2 3.4*   No results for input(s): LIPASE, AMYLASE in the last 168 hours. No results for input(s): AMMONIA in the last 168 hours. Coagulation Profile: Recent Labs  Lab 01/07/18 0753  INR 1.03   Cardiac Enzymes: Recent Labs  Lab 01/06/18 0845  CKTOTAL 249*   BNP (last 3 results) No results for input(s): PROBNP in the last 8760 hours. HbA1C: No results for input(s): HGBA1C in the last 72 hours. CBG: No results for input(s): GLUCAP in the last 168 hours. Lipid Profile: No results for input(s): CHOL, HDL, LDLCALC, TRIG, CHOLHDL, LDLDIRECT in the last 72 hours. Thyroid Function Tests: No results for input(s): TSH, T4TOTAL, FREET4, T3FREE, THYROIDAB in the last 72 hours. Anemia Panel: No results for input(s): VITAMINB12, FOLATE, FERRITIN, TIBC, IRON, RETICCTPCT in the last 72 hours. Urine analysis:    Component Value Date/Time   COLORURINE YELLOW 01/06/2018 1314   APPEARANCEUR HAZY (A) 01/06/2018 1314   LABSPEC 1.026 01/06/2018 1314   PHURINE 5.0 01/06/2018 1314   GLUCOSEU NEGATIVE 01/06/2018 1314   HGBUR LARGE (A) 01/06/2018 1314   BILIRUBINUR NEGATIVE 01/06/2018 1314   KETONESUR 5 (A) 01/06/2018 1314   PROTEINUR 30 (A) 01/06/2018 1314   UROBILINOGEN 1.0 04/27/2015 1655   NITRITE NEGATIVE 01/06/2018 1314   LEUKOCYTESUR NEGATIVE 01/06/2018 1314   Sepsis Labs: @LABRCNTIP (procalcitonin:4,lacticidven:4) )No results found for this or any previous visit (from the past 240 hour(s)).       Radiology Studies: Nm Gi Blood Loss  Result Date: 01/10/2018 CLINICAL DATA:  GI bleeding. EXAM: NUCLEAR MEDICINE GASTROINTESTINAL BLEEDING SCAN TECHNIQUE: Sequential abdominal images were obtained for 2 hours following intravenous administration of Tc-39m labeled red blood cells. RADIOPHARMACEUTICALS:  27.8 mCi Tc-40m pertechnetate in-vitro labeled red cells. COMPARISON:  CT scan 01/06/2018 FINDINGS: Planar imaging over 120 minutes shows no evidence for  radiotracer accumulation over the abdomen/pelvis to suggest active GI bleeding. IMPRESSION: No active GI bleeding during the 2 hours of observation. Electronically Signed   By: Misty Stanley M.D.   On: 01/10/2018 13:36      Scheduled Meds: . pantoprazole  40 mg Oral Daily  . polyethylene glycol  17 g Oral BID  . pravastatin  20 mg Oral Daily   Continuous Infusions: . 0.9 % NaCl with KCl 40 mEq / L 125 mL/hr (01/11/18 1007)  . famotidine (PEPCID) IV Stopped (01/11/18 0949)     LOS: 1 day    Time spent in minutes: 35    Debbe Odea, MD [facility] Pager: [email or web address] Password [facility] 01/11/2018,  12:21 PM

## 2018-01-12 LAB — CBC
HCT: 26.2 % — ABNORMAL LOW (ref 36.0–46.0)
HCT: 27 % — ABNORMAL LOW (ref 36.0–46.0)
Hemoglobin: 8.2 g/dL — ABNORMAL LOW (ref 12.0–15.0)
Hemoglobin: 8.6 g/dL — ABNORMAL LOW (ref 12.0–15.0)
MCH: 24.6 pg — ABNORMAL LOW (ref 26.0–34.0)
MCH: 25.1 pg — ABNORMAL LOW (ref 26.0–34.0)
MCHC: 31.3 g/dL (ref 30.0–36.0)
MCHC: 31.9 g/dL (ref 30.0–36.0)
MCV: 78.7 fL (ref 78.0–100.0)
MCV: 78.7 fL (ref 78.0–100.0)
Platelets: 252 10*3/uL (ref 150–400)
Platelets: 288 10*3/uL (ref 150–400)
RBC: 3.33 MIL/uL — ABNORMAL LOW (ref 3.87–5.11)
RBC: 3.43 MIL/uL — ABNORMAL LOW (ref 3.87–5.11)
RDW: 16.3 % — ABNORMAL HIGH (ref 11.5–15.5)
RDW: 16.6 % — ABNORMAL HIGH (ref 11.5–15.5)
WBC: 6.5 10*3/uL (ref 4.0–10.5)
WBC: 7.2 10*3/uL (ref 4.0–10.5)

## 2018-01-12 LAB — BASIC METABOLIC PANEL
Anion gap: 7 (ref 5–15)
BUN: <5 mg/dL — ABNORMAL LOW (ref 6–20)
CO2: 25 mmol/L (ref 22–32)
Calcium: 8.6 mg/dL — ABNORMAL LOW (ref 8.9–10.3)
Chloride: 112 mmol/L — ABNORMAL HIGH (ref 101–111)
Creatinine, Ser: 0.84 mg/dL (ref 0.44–1.00)
GFR calc Af Amer: >60 mL/min (ref >60)
GFR calc non Af Amer: >60 mL/min (ref >60)
Glucose, Bld: 92 mg/dL (ref 65–99)
Potassium: 3.8 mmol/L (ref 3.5–5.1)
Sodium: 144 mmol/L (ref 135–145)

## 2018-01-12 LAB — PREPARE RBC (CROSSMATCH)

## 2018-01-12 MED ORDER — SODIUM CHLORIDE 0.9 % IV SOLN: Freq: Once | INTRAVENOUS | Status: DC

## 2018-01-12 MED ORDER — PRO-STAT SUGAR FREE PO LIQD
30.0000 mL | Freq: Two times a day (BID) | ORAL | Status: DC
  Administered 2018-01-12 – 2018-01-14 (×5): 30 mL via ORAL
  Filled 2018-01-12 (×5): qty 30

## 2018-01-12 NOTE — Progress Notes (Signed)
Progress Note for [facility].  Subjective: She reports a large bloody bowel movement yesterday.  Objective: Vital signs in last 24 hours: Temp:  [98.5 F (36.9 C)-98.6 F (37 C)] 98.6 F (37 C) (06/16 0444) Pulse Rate:  [88-98] 92 (06/16 0444) Resp:  [16-20] 20 (06/16 0444) BP: (142-162)/(77-94) 143/77 (06/16 0444) SpO2:  [98 %-100 %] 100 % (06/16 0444) Last BM Date: 01/11/18  Intake/Output from previous day: 06/15 0701 - 06/16 0700 In: 3836.3 [P.O.:480; I.V.:3014.6; IV Piggyback:341.7] Out: 300 [Urine:300] Intake/Output this shift: No intake/output data recorded.  General appearance: alert and no distress GI: soft, non-tender; bowel sounds normal; no masses,  no organomegaly  Lab Results: Recent Labs    01/10/18 1740 01/11/18 0345 01/11/18 0805 01/11/18 1818  WBC 5.9  --  7.3 7.4  HGB 7.1* 10.0* 8.5* 8.5*  HCT 22.5* 30.4* 26.7* 26.4*  PLT 253  --  250 256   BMET Recent Labs    01/11/18 0345  NA 142  K 3.7  CL 111  CO2 24  GLUCOSE 99  BUN 15  CREATININE 0.94  CALCIUM 8.5*   LFT No results for input(s): PROT, ALBUMIN, AST, ALT, ALKPHOS, BILITOT, BILIDIR, IBILI in the last 72 hours. PT/INR No results for input(s): LABPROT, INR in the last 72 hours. Hepatitis Panel No results for input(s): HEPBSAG, HCVAB, HEPAIGM, HEPBIGM in the last 72 hours. C-Diff No results for input(s): CDIFFTOX in the last 72 hours. Fecal Lactopherrin No results for input(s): FECLLACTOFRN in the last 72 hours.  Studies/Results: Nm Gi Blood Loss  Result Date: 01/10/2018 CLINICAL DATA:  GI bleeding. EXAM: NUCLEAR MEDICINE GASTROINTESTINAL BLEEDING SCAN TECHNIQUE: Sequential abdominal images were obtained for 2 hours following intravenous administration of Tc-53m labeled red blood cells. RADIOPHARMACEUTICALS:  27.8 mCi Tc-61m pertechnetate in-vitro labeled red cells. COMPARISON:  CT scan 01/06/2018 FINDINGS: Planar imaging over 120 minutes shows no evidence for radiotracer  accumulation over the abdomen/pelvis to suggest active GI bleeding. IMPRESSION: No active GI bleeding during the 2 hours of observation. Electronically Signed   By: Misty Stanley M.D.   On: 01/10/2018 13:36    Medications:  Scheduled: . pantoprazole  40 mg Oral Daily  . polyethylene glycol  17 g Oral BID  . pravastatin  20 mg Oral Daily   Continuous: . 0.9 % NaCl with KCl 40 mEq / L 125 mL/hr (01/12/18 0158)  . famotidine (PEPCID) IV Stopped (01/11/18 2211)    Assessment/Plan: 1) Persistent diverticular bleeding. 2) Anemia.   Her CBC is pending for today.  She continues to have hematochezia and she will need to remain in the hospital until this issue resolves.  Plan: 1) Follow CBC. 2) Supportive care.  LOS: 2 days   Kayci Belleville D 01/12/2018, 7:41 AM

## 2018-01-12 NOTE — Progress Notes (Signed)
Dr. Wynelle Cleveland aware via phone that pt has had 2 loose stools this am with blood noted. Pt reported feeling weaker today and intermittently dizzy when up. See new orders received for PRBC's today.

## 2018-01-12 NOTE — Progress Notes (Signed)
PROGRESS NOTE    Jennifer Davila   VFI:[id number]  DOB: 11-28-1955  DOA: 01/06/2018 PCP: Martin[place], [name], MD   Brief Narrative:  Jennifer Davila is a 62 y/o with hypertension, hyperlipidemia,chronic bilateral lower extremity pain, diverticulosis; admitted on 01/06/2018, presented with complaint of blood in the stool.   Subjective: Unfortunately had another BM with bright red blood yesterday.   .   Assessment & Plan:   Principal Problem:   Lower GI bleed - colonoscopy shows diverticulosis but no active bleeding - cont to follow - will need Nuclear scan if bleeding continues -6/14-due to 2 bloody bowel movements yesterday, her diet has been changed back to n.p.o. except meds, IV fluids resumed and a nuclear bleeding scan has been ordered -   bleeding scan - negative- cont to follow until bleeding resolves per GI - still on full liquids- adding Prostat BID today to prevent protein calorie malnutrition  Active Problems: Acute blood loss anemia - Hb 11.5>> 9.2>>8.6>> 8.0>> 7.1- given 1 U PRBC on 6/14- subsequent Hb 8.5>>8.2 - anemia panel consistent with AOCD - following- aspirin on hold    Hypertension, essential, benign -Due to ongoing bleeding we, hold Cozaar to prevent hypotension- HCTZ on hold    Morbid obesity with BMI of 40.0-44.9, adult   Body mass index is 43.9 kg/m.    GERD (gastroesophageal reflux disease) - added PPI- takes nexium at home    Hypokalemia - replaced    Constipation - resolved with colon prep   DVT prophylaxis: SCD Code Status: Full code Family Communication:  Disposition Plan: cont to follow bleeding and hemoglobin  Consultants:   GI Procedures:   Colonoscopy Antimicrobials:  Anti-infectives (From admission, onward)   None       Objective: Vitals:   01/11/18 1035 01/11/18 1313 01/11/18 2045 01/12/18 0444  BP: (!) 162/94 (!) 155/80 (!) 142/80 (!) 143/77  Pulse: 98 88 89 92  Resp:  16 16 20   Temp:  98.6 F  (37 C) 98.5 F (36.9 C) 98.6 F (37 C)  TempSrc:  Oral Oral Oral  SpO2:  98% 100% 100%  Weight:      Height:        Intake/Output Summary (Last 24 hours) at 01/12/2018 1021 Last data filed at 01/12/2018 0600 Gross per 24 hour  Intake 2910 ml  Output -  Net 2910 ml   Filed Weights   01/06/18 1113 01/08/18 1445  Weight: 108.9 kg (240 lb) 108.9 kg (240 lb)    Examination: General exam: Appears comfortable  HEENT: PERRLA, oral mucosa moist, no sclera icterus or thrush Respiratory system: Clear to auscultation. Respiratory effort normal. Cardiovascular system: S1 & S2 heard, RRR.   Gastrointestinal system: Abdomen soft, non-tender, nondistended. Normal bowel sound. No organomegaly Central nervous system: Alert and oriented. No focal neurological deficits. Extremities: No cyanosis, clubbing or edema Skin: No rashes or ulcers Psychiatry:  Mood & affect appropriate.   Data Reviewed: I have personally reviewed following labs and imaging studies  CBC: Recent Labs  Lab 01/06/18 0845  01/08/18 0443  01/09/18 2230 01/10/18 1740 01/11/18 0345 01/11/18 0805 01/11/18 1818 01/12/18 0803  WBC 4.2   < > 5.7   < > 6.0 5.9  --  7.3 7.4 6.5  NEUTROABS 2.6  --  3.5  --   --   --   --   --   --   --   HGB 11.7*   < > 9.2*   < > 8.0*  7.1* 10.0* 8.5* 8.5* 8.2*  HCT 36.6   < > 29.7*   < > 25.4* 22.5* 30.4* 26.7* 26.4* 26.2*  MCV 73.4*   < > 75.0*   < > 75.4* 75.8*  --  77.8* 77.9* 78.7  PLT 263.0   < > 239   < > 272 253  --  250 256 252   < > = values in this interval not displayed.   Basic Metabolic Panel: Recent Labs  Lab 01/06/18 1205 01/07/18 0434 01/09/18 0637 01/11/18 0345  NA 144 142 141 142  K 3.4* 3.3* 3.1* 3.7  CL 107 106 108 111  CO2 27 28 24 24   GLUCOSE 131* 96 108* 99  BUN 17 15 8 15   CREATININE 1.00 0.86 0.87 0.94  CALCIUM 8.8* 8.2* 8.4* 8.5*   GFR: Estimated Creatinine Clearance: 70.3 mL/min (by C-G formula based on SCr of 0.94 mg/dL). Liver Function  Tests: Recent Labs  Lab 01/06/18 1205 01/09/18 0637  AST 18 15  ALT 18 15  ALKPHOS 103 90  BILITOT 0.7 0.7  PROT 7.2 5.8*  ALBUMIN 4.2 3.4*   No results for input(s): LIPASE, AMYLASE in the last 168 hours. No results for input(s): AMMONIA in the last 168 hours. Coagulation Profile: Recent Labs  Lab 01/07/18 0753  INR 1.03   Cardiac Enzymes: Recent Labs  Lab 01/06/18 0845  CKTOTAL 249*   BNP (last 3 results) No results for input(s): PROBNP in the last 8760 hours. HbA1C: No results for input(s): HGBA1C in the last 72 hours. CBG: No results for input(s): GLUCAP in the last 168 hours. Lipid Profile: No results for input(s): CHOL, HDL, LDLCALC, TRIG, CHOLHDL, LDLDIRECT in the last 72 hours. Thyroid Function Tests: No results for input(s): TSH, T4TOTAL, FREET4, T3FREE, THYROIDAB in the last 72 hours. Anemia Panel: No results for input(s): VITAMINB12, FOLATE, FERRITIN, TIBC, IRON, RETICCTPCT in the last 72 hours. Urine analysis:    Component Value Date/Time   COLORURINE YELLOW 01/06/2018 1314   APPEARANCEUR HAZY (A) 01/06/2018 1314   LABSPEC 1.026 01/06/2018 1314   PHURINE 5.0 01/06/2018 1314   GLUCOSEU NEGATIVE 01/06/2018 1314   HGBUR LARGE (A) 01/06/2018 1314   BILIRUBINUR NEGATIVE 01/06/2018 1314   KETONESUR 5 (A) 01/06/2018 1314   PROTEINUR 30 (A) 01/06/2018 1314   UROBILINOGEN 1.0 04/27/2015 1655   NITRITE NEGATIVE 01/06/2018 1314   LEUKOCYTESUR NEGATIVE 01/06/2018 1314   Sepsis Labs: @LABRCNTIP (procalcitonin:4,lacticidven:4) )No results found for this or any previous visit (from the past 240 hour(s)).       Radiology Studies: Nm Gi Blood Loss  Result Date: 01/10/2018 CLINICAL DATA:  GI bleeding. EXAM: NUCLEAR MEDICINE GASTROINTESTINAL BLEEDING SCAN TECHNIQUE: Sequential abdominal images were obtained for 2 hours following intravenous administration of Tc-37m labeled red blood cells. RADIOPHARMACEUTICALS:  27.8 mCi Tc-5m pertechnetate in-vitro labeled  red cells. COMPARISON:  CT scan 01/06/2018 FINDINGS: Planar imaging over 120 minutes shows no evidence for radiotracer accumulation over the abdomen/pelvis to suggest active GI bleeding. IMPRESSION: No active GI bleeding during the 2 hours of observation. Electronically Signed   By: Misty Stanley M.D.   On: 01/10/2018 13:36      Scheduled Meds: . feeding supplement (PRO-STAT SUGAR FREE 64)  30 mL Oral BID  . pantoprazole  40 mg Oral Daily  . polyethylene glycol  17 g Oral BID  . pravastatin  20 mg Oral Daily   Continuous Infusions: . 0.9 % NaCl with KCl 40 mEq / L 125 mL/hr (01/12/18 0158)  .  famotidine (PEPCID) IV Stopped (01/11/18 2211)     LOS: 2 days    Time spent in minutes: 35    Debbe Odea, MD [facility] Pager: [email or web address] Password [facility] 01/12/2018, 10:21 AM

## 2018-01-13 LAB — TYPE AND SCREEN
ABO/RH(D): O POS
Antibody Screen: NEGATIVE
Unit division: 0
Unit division: 0
Unit division: 0

## 2018-01-13 LAB — BPAM RBC
Blood Product Expiration Date: 201907062359
Blood Product Expiration Date: 201907072359
Blood Product Expiration Date: 201907072359
ISSUE DATE / TIME: 201906142233
ISSUE DATE / TIME: 201906161933
ISSUE DATE / TIME: 201906162257
Unit Type and Rh: 5100
Unit Type and Rh: 5100
Unit Type and Rh: 5100

## 2018-01-13 LAB — CBC
HCT: 33 % — ABNORMAL LOW (ref 36.0–46.0)
HCT: 35.4 % — ABNORMAL LOW (ref 36.0–46.0)
HCT: 32.7 % — ABNORMAL LOW (ref 36.0–46.0)
Hemoglobin: 10.7 g/dL — ABNORMAL LOW (ref 12.0–15.0)
Hemoglobin: 11.6 g/dL — ABNORMAL LOW (ref 12.0–15.0)
Hemoglobin: 10.5 g/dL — ABNORMAL LOW (ref 12.0–15.0)
MCH: 25.7 pg — ABNORMAL LOW (ref 26.0–34.0)
MCH: 26 pg (ref 26.0–34.0)
MCH: 25.5 pg — ABNORMAL LOW (ref 26.0–34.0)
MCHC: 32.4 g/dL (ref 30.0–36.0)
MCHC: 32.8 g/dL (ref 30.0–36.0)
MCHC: 32.1 g/dL (ref 30.0–36.0)
MCV: 79.3 fL (ref 78.0–100.0)
MCV: 79.4 fL (ref 78.0–100.0)
MCV: 79.6 fL (ref 78.0–100.0)
Platelets: 273 10*3/uL (ref 150–400)
Platelets: 291 10*3/uL (ref 150–400)
Platelets: 266 10*3/uL (ref 150–400)
RBC: 4.16 MIL/uL (ref 3.87–5.11)
RBC: 4.46 MIL/uL (ref 3.87–5.11)
RBC: 4.11 MIL/uL (ref 3.87–5.11)
RDW: 16.3 % — ABNORMAL HIGH (ref 11.5–15.5)
RDW: 16.4 % — ABNORMAL HIGH (ref 11.5–15.5)
RDW: 16.4 % — ABNORMAL HIGH (ref 11.5–15.5)
WBC: 7.6 10*3/uL (ref 4.0–10.5)
WBC: 7.9 10*3/uL (ref 4.0–10.5)
WBC: 7.6 10*3/uL (ref 4.0–10.5)

## 2018-01-13 LAB — BASIC METABOLIC PANEL
Anion gap: 8 (ref 5–15)
BUN: 8 mg/dL (ref 6–20)
CO2: 27 mmol/L (ref 22–32)
Calcium: 8.7 mg/dL — ABNORMAL LOW (ref 8.9–10.3)
Chloride: 108 mmol/L (ref 101–111)
Creatinine, Ser: 0.81 mg/dL (ref 0.44–1.00)
GFR calc Af Amer: >60 mL/min (ref >60)
GFR calc non Af Amer: >60 mL/min (ref >60)
Glucose, Bld: 98 mg/dL (ref 65–99)
Potassium: 3.6 mmol/L (ref 3.5–5.1)
Sodium: 143 mmol/L (ref 135–145)

## 2018-01-13 NOTE — Progress Notes (Addendum)
     [facility] Progress Note   Chief Complaint:   Rectal bleeding   SUBJECTIVE:    Two blood BMs again this am. The one at 4am was darker blood than previous but subsequent BM had lighter blood.    ASSESSMENT AND PLAN:   1. 62 yo female with painless hematochezia. Diverticula and blood found throughout colon on colonoscopy on 6/12 but source of bleeding not localized.  Suspect this is a diverticular hemorrhage. Bleeding scan 2 days ago was negative. She had two BMs with blood again this am.  -Patient feels like bleeding may be slowing down based on amount / color of blood in stool this am. Continue to monitor for bleeding, monitor CBC.  -Keep on full liquids for now -Miralax on hold given increased frequency of stool but it should be resumed at discharge.   2. Anemia of ABL. Received received 2 units of blood yesterday (3 total this admission) for symptomatic anemia (hgb 8.6). Appropriate rise in hgb to 10.7 today.     Attending physician's note   I have taken an interval history, reviewed the chart and examined the patient. I agree with the Advanced Practitioner's note, impression and recommendations. Presumed diverticular source with intermittent bleeding and ABL anemia. Full liquids, rest and trend CBC for now.   Lucio Edward, MD FACG [phone] office     OBJECTIVE:     Vital signs in last 24 hours: Temp:  [98 F (36.7 C)-98.9 F (37.2 C)] 98 F (36.7 C) (06/17 0552) Pulse Rate:  [85-93] 87 (06/17 0552) Resp:  [14-17] 15 (06/17 0552) BP: (127-168)/(67-92) 127/67 (06/17 0552) SpO2:  [97 %-100 %] 97 % (06/17 0552) Last BM Date: 01/12/18 General:   Alert, female in NAD EENT:  Normal hearing, non icteric sclera, conjunctive pink.  Heart:  Regular rate and rhythm; no lower extremity edema Pulm: Normal respiratory effort, lungs CTA bilaterally without wheezes or crackles. Abdomen:  Soft, nondistended, nontender.  Normal bowel sounds, no masses felt.  Neurologic:  Alert and  oriented x4;  grossly normal neurologically. Psych:  Pleasant, cooperative.  Normal mood and affect.   Intake/Output from previous day: 06/16 0701 - 06/17 0700 In: 5057 [P.O.:720; I.V.:3000; Blood:1232; IV Piggyback:105] Out: 200 [Urine:200] Intake/Output this shift: No intake/output data recorded.  Lab Results: Recent Labs    01/12/18 0803 01/12/18 1803 01/13/18 0410  WBC 6.5 7.2 7.6  HGB 8.2* 8.6* 10.7*  HCT 26.2* 27.0* 33.0*  PLT 252 288 273   BMET Recent Labs    01/11/18 0345 01/12/18 0901 01/13/18 0410  NA 142 144 143  K 3.7 3.8 3.6  CL 111 112* 108  CO2 24 25 27   GLUCOSE 99 92 98  BUN 15 <5* 8  CREATININE 0.94 0.84 0.81  CALCIUM 8.5* 8.6* 8.7*     Principal Problem:   Lower GI bleed Active Problems:   Hip pain   Hypertension, essential, benign   Hyperlipidemia   Morbid obesity with BMI of 40.0-44.9, adult (HCC)   GERD (gastroesophageal reflux disease)   Hypokalemia   Constipation   Diverticulosis of colon with hemorrhage    LOS: 3 days   Tye Savoy ,NP 01/13/2018, 9:02 AM

## 2018-01-13 NOTE — Progress Notes (Signed)
PROGRESS NOTE    Jennifer Davila   XLK:[id number]  DOB: July 10, 1956  DOA: 01/06/2018 PCP: Martinique, [name], MD   Brief Narrative:  Jennifer Davila is a 62 y/o with hypertension, hyperlipidemia,chronic bilateral lower extremity pain, diverticulosis; admitted on 01/06/2018, presented with complaint of blood in the stool.   Subjective: She has has 2 more BMs today but these are less bloody than prior ones. No dizziness.   .   Assessment & Plan:   Principal Problem:   Lower GI bleed - colonoscopy shows diverticulosis but no active bleeding - cont to follow - will need Nuclear scan if bleeding continues -6/14-due to 2 bloody bowel movements yesterday, her diet has been changed back to n.p.o. except meds, IV fluids resumed and a nuclear bleeding scan has been ordered -   bleeding scan - negative- cont to follow until bleeding resolves per GI - still on full liquids- adding Prostat BID today to prevent protein calorie malnutrition - 6/17- hopefully bleeding is easing- cont to follow  Active Problems: Acute blood loss anemia - Hb 11.5>> 9.2>>8.6>> 8.0>> 7.1- given 1 U PRBC on 6/14- subsequent Hb 8.5>>8.2 - anemia panel consistent with AOCD - following- aspirin on hold    Hypertension, essential, benign -Due to ongoing bleeding we, hold Cozaar to prevent hypotension- HCTZ on hold    Morbid obesity with BMI of 40.0-44.9, adult   Body mass index is 43.9 kg/m.    GERD (gastroesophageal reflux disease) - added PPI- takes nexium at home    Hypokalemia - replaced    Constipation - resolved with colon prep   DVT prophylaxis: SCD Code Status: Full code Family Communication:  Disposition Plan: cont to follow bleeding and hemoglobin  Consultants:   GI Procedures:   Colonoscopy Antimicrobials:  Anti-infectives (From admission, onward)   None       Objective: Vitals:   01/12/18 2247 01/12/18 2319 01/13/18 0135 01/13/18 0552  BP: (!) 168/89 (!) 164/88  (!) 164/85 127/67  Pulse: 93 91 87 87  Resp: 16 17 17 15   Temp: 98.5 F (36.9 C) 98.3 F (36.8 C) 98.2 F (36.8 C) 98 F (36.7 C)  TempSrc: Oral Oral Oral Oral  SpO2: 100% 100% 100% 97%  Weight:      Height:        Intake/Output Summary (Last 24 hours) at 01/13/2018 1322 Last data filed at 01/13/2018 1000 Gross per 24 hour  Intake 5075.33 ml  Output 200 ml  Net 4875.33 ml   Filed Weights   01/06/18 1113 01/08/18 1445  Weight: 108.9 kg (240 lb) 108.9 kg (240 lb)    Examination: General exam: Appears comfortable  HEENT: PERRLA, oral mucosa moist, no sclera icterus or thrush Respiratory system: Clear to auscultation. Respiratory effort normal. Cardiovascular system: S1 & S2 heard, RRR.   Gastrointestinal system: Abdomen soft, non-tender, nondistended. Normal bowel sound. No organomegaly Central nervous system: Alert and oriented. No focal neurological deficits. Extremities: No cyanosis, clubbing or edema Skin: No rashes or ulcers Psychiatry:  Mood & affect appropriate.   Data Reviewed: I have personally reviewed following labs and imaging studies  CBC: Recent Labs  Lab 01/08/18 0443  01/11/18 1818 01/12/18 0803 01/12/18 1803 01/13/18 0410 01/13/18 1034  WBC 5.7   < > 7.4 6.5 7.2 7.6 7.9  NEUTROABS 3.5  --   --   --   --   --   --   HGB 9.2*   < > 8.5* 8.2* 8.6* 10.7* 11.6*  HCT 29.7*   < > 26.4* 26.2* 27.0* 33.0* 35.4*  MCV 75.0*   < > 77.9* 78.7 78.7 79.3 79.4  PLT 239   < > 256 252 288 273 291   < > = values in this interval not displayed.   Basic Metabolic Panel: Recent Labs  Lab 01/07/18 0434 01/09/18 0637 01/11/18 0345 01/12/18 0901 01/13/18 0410  NA 142 141 142 144 143  K 3.3* 3.1* 3.7 3.8 3.6  CL 106 108 111 112* 108  CO2 28 24 24 25 27   GLUCOSE 96 108* 99 92 98  BUN 15 8 15  <5* 8  CREATININE 0.86 0.87 0.94 0.84 0.81  CALCIUM 8.2* 8.4* 8.5* 8.6* 8.7*   GFR: Estimated Creatinine Clearance: 81.5 mL/min (by C-G formula based on SCr of 0.81  mg/dL). Liver Function Tests: Recent Labs  Lab 01/09/18 0637  AST 15  ALT 15  ALKPHOS 90  BILITOT 0.7  PROT 5.8*  ALBUMIN 3.4*   No results for input(s): LIPASE, AMYLASE in the last 168 hours. No results for input(s): AMMONIA in the last 168 hours. Coagulation Profile: Recent Labs  Lab 01/07/18 0753  INR 1.03   Cardiac Enzymes: No results for input(s): CKTOTAL, CKMB, CKMBINDEX, TROPONINI in the last 168 hours. BNP (last 3 results) No results for input(s): PROBNP in the last 8760 hours. HbA1C: No results for input(s): HGBA1C in the last 72 hours. CBG: No results for input(s): GLUCAP in the last 168 hours. Lipid Profile: No results for input(s): CHOL, HDL, LDLCALC, TRIG, CHOLHDL, LDLDIRECT in the last 72 hours. Thyroid Function Tests: No results for input(s): TSH, T4TOTAL, FREET4, T3FREE, THYROIDAB in the last 72 hours. Anemia Panel: No results for input(s): VITAMINB12, FOLATE, FERRITIN, TIBC, IRON, RETICCTPCT in the last 72 hours. Urine analysis:    Component Value Date/Time   COLORURINE YELLOW 01/06/2018 1314   APPEARANCEUR HAZY (A) 01/06/2018 1314   LABSPEC 1.026 01/06/2018 1314   PHURINE 5.0 01/06/2018 1314   GLUCOSEU NEGATIVE 01/06/2018 1314   HGBUR LARGE (A) 01/06/2018 1314   BILIRUBINUR NEGATIVE 01/06/2018 1314   KETONESUR 5 (A) 01/06/2018 1314   PROTEINUR 30 (A) 01/06/2018 1314   UROBILINOGEN 1.0 04/27/2015 1655   NITRITE NEGATIVE 01/06/2018 1314   LEUKOCYTESUR NEGATIVE 01/06/2018 1314   Sepsis Labs: @LABRCNTIP (procalcitonin:4,lacticidven:4) )No results found for this or any previous visit (from the past 240 hour(s)).       Radiology Studies: No results found.    Scheduled Meds: . feeding supplement (PRO-STAT SUGAR FREE 64)  30 mL Oral BID  . pantoprazole  40 mg Oral Daily  . pravastatin  20 mg Oral Daily   Continuous Infusions: . sodium chloride    . 0.9 % NaCl with KCl 40 mEq / L 125 mL/hr (01/13/18 1246)  . famotidine (PEPCID) IV Stopped  (01/13/18 1019)     LOS: 3 days    Time spent in minutes: 35    Debbe Odea, MD [facility] Pager: [email or web address] Password [email or web address] 01/13/2018, 1:22 PM

## 2018-01-13 NOTE — Progress Notes (Signed)
Physical Therapy Treatment Patient Details Name: Jennifer Davila MRN: [id number] DOB: 03-22-1956 Today's Date: 01/13/2018    History of Present Illness 62 yo female was admitted for GI bleed with weakness and notable hematuria, fecal impaction.  MD is having colonoscopy today, suspected diverticular bleed.  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   Follow Up Recommendations  Supervision for mobility/OOB;No PT follow up     Equipment Recommendations  Rolling walker with 5" wheels    Recommendations for Other Services       Precautions / Restrictions Precautions Precautions: Fall Precaution Comments: denies h/o falls Restrictions Weight Bearing Restrictions: No    Mobility  Bed Mobility               General bed mobility comments: sitting on edge of bed  Transfers Overall transfer level: Needs assistance   Transfers: Sit to/from Stand Sit to Stand: Supervision         General transfer comment: supervision for safety, mildly unsteady initially  Ambulation/Gait Ambulation/Gait assistance: Supervision Gait Distance (Feet): 400 Feet Assistive device: IV Pole Gait Pattern/deviations: Step-through pattern;Decreased stride length Gait velocity: reduced   General Gait Details: steady holding IV pole, 1 standing rest break after 200', pt reports "strain" in B hamstrings, instructed pt in supine hamstring stretch   Stairs             Wheelchair Mobility    Modified Rankin (Stroke Patients Only)       Balance   Sitting-balance support: Feet supported Sitting balance-Leahy Scale: Good       Standing balance-Leahy Scale: Fair                              Cognition Arousal/Alertness: Awake/alert Behavior During Therapy: WFL for tasks assessed/performed Overall Cognitive  Status: Within Functional Limits for tasks assessed                                        Exercises      General Comments        Pertinent Vitals/Pain Pain Assessment: No/denies pain    Home Living                      Prior Function            PT Goals (current goals can now be found in the care plan section) Acute Rehab PT Goals Patient Stated Goal: likes to bake cakes PT Goal Formulation: With patient Time For Goal Achievement: 01/22/18 Potential to Achieve Goals: Good Progress towards PT goals: Progressing toward goals    Frequency    Min 3X/week      PT Plan Discharge plan needs to be updated    Co-evaluation              AM-PAC PT "6 Clicks" Daily Activity  Outcome Measure  Difficulty turning over in bed (including adjusting bedclothes, sheets and blankets)?: A Little Difficulty moving from lying on back to sitting on the side of the bed? : A Little Difficulty sitting down on and standing up from a chair with arms (e.g., wheelchair, bedside commode, etc,.)?: A Little Help needed moving to and from a  bed to chair (including a wheelchair)?: A Little Help needed walking in hospital room?: A Little Help needed climbing 3-5 steps with a railing? : A Lot 6 Click Score: 17    End of Session Equipment Utilized During Treatment: Gait belt Activity Tolerance: Patient tolerated treatment well Patient left: with call bell/phone within reach;in chair(awaiting colonoscopy) Nurse Communication: Mobility status PT Visit Diagnosis: Unsteadiness on feet (R26.81);Difficulty in walking, not elsewhere classified (R26.2) Pain - Right/Left: (central) Pain - part of body: (abdomen)     Time: 1040-1059 PT Time Calculation (min) (ACUTE ONLY): 19 min  Charges:  $Gait Training: 8-22 mins                    G Codes:          Blondell Reveal Kistler 01/13/2018, 11:06 AM [phone]

## 2018-01-14 DIAGNOSIS — K219 Gastro-esophageal reflux disease without esophagitis: Secondary | ICD-10-CM

## 2018-01-14 LAB — CBC
HCT: 33.3 % — ABNORMAL LOW (ref 36.0–46.0)
Hemoglobin: 10.8 g/dL — ABNORMAL LOW (ref 12.0–15.0)
MCH: 25.7 pg — ABNORMAL LOW (ref 26.0–34.0)
MCHC: 32.4 g/dL (ref 30.0–36.0)
MCV: 79.3 fL (ref 78.0–100.0)
Platelets: 309 10*3/uL (ref 150–400)
RBC: 4.2 MIL/uL (ref 3.87–5.11)
RDW: 16.5 % — ABNORMAL HIGH (ref 11.5–15.5)
WBC: 9.1 10*3/uL (ref 4.0–10.5)

## 2018-01-14 MED ORDER — POLYETHYLENE GLYCOL 3350 17 G PO PACK: 17.0000 g | PACK | Freq: Every day | ORAL | Status: DC | PRN

## 2018-01-14 MED ORDER — POLYETHYLENE GLYCOL 3350 17 G PO PACK: 17.0000 g | PACK | Freq: Every day | ORAL | 0 refills | Status: DC | PRN

## 2018-01-14 MED ORDER — CALCIUM POLYCARBOPHIL 625 MG PO TABS: 625.0000 mg | ORAL_TABLET | Freq: Two times a day (BID) | ORAL | 0 refills | Status: DC

## 2018-01-14 MED ORDER — PRO-STAT SUGAR FREE PO LIQD: 30.0000 mL | Freq: Two times a day (BID) | ORAL | 0 refills | Status: DC

## 2018-01-14 NOTE — Discharge Summary (Signed)
Physician Discharge Summary  Jennifer Davila YSA:[id number] DOB: 1955-11-08 DOA: 01/06/2018  PCP: [place], [name], MD  Admit date: 01/06/2018 Discharge date: 01/14/2018  Admitted From: home  Disposition:  home   Recommendations for Outpatient Follow-up:  1. Cont efforts to prevent constipation  Home Health:  none  Equipment/Devices:  none    Discharge Condition:  stable   CODE STATUS:  Full code  Consultations:  GI    Discharge Diagnoses:  Principal Problem:   Lower GI bleed Active Problems:   Hip pain   Hypertension, essential, benign   Hyperlipidemia   Morbid obesity with BMI of 40.0-44.9, adult (HCC)   GERD (gastroesophageal reflux disease)   Hypokalemia   Constipation   Diverticulosis of colon with hemorrhage    Subjective: 2 normal BMs yesterday without blood.   Brief Summary: Jennifer Davila is a 62 y/o withhypertension, hyperlipidemia,chronic bilateral lower extremity pain, diverticulosis; admitted on6/04/2018, presented with complaint ofblood in the stool.  Hospital Course:  Principal Problem:   Lower GI bleed - colonoscopy shows diverticulosis but no active bleeding - cont to follow - will need Nuclear scan if bleeding continues -6/14-due to 2 bloody bowel movements yesterday, her diet has been changed back to n.p.o. except meds, IV fluids resumed and a nuclear bleeding scan has been ordered -   bleeding scan - negative- cont to follow until bleeding resolves per GI - still on full liquids- adding Prostat BID today to prevent protein calorie malnutrition - bleeding appears to have stopped. Ok to d/c home today - cont to hold ASA, NSAIDs and HCTZ- plan discussed with patient in detail. She knows to take Miralax and Fibercon and advance diet slowly.   Active Problems: Acute blood loss anemia - Hb 11.5>> 9.2>>8.6>> 8.0>> 7.1- given 1 U PRBC on 6/14- subsequent Hb 8.5>>8.2 - anemia panel consistent with AOCD - following- aspirin on  hold    Hypertension, essential, benign -Due to ongoing bleeding we, hold Cozaar to prevent hypotension- HCTZ on hold    Morbid obesity with BMI of 40.0-44.9, adult   Body mass index is 43.9 kg/m.    GERD (gastroesophageal reflux disease) - added PPI- takes nexium at home    Hypokalemia - replaced    Constipation - resolved with colon prep  Discharge Exam: Vitals:   01/13/18 2104 01/14/18 0558  BP: (!) 159/88 (!) 146/84  Pulse: 79 83  Resp: 16 16  Temp: 98.8 F (37.1 C) 98.4 F (36.9 C)  SpO2: 100% 100%   Vitals:   01/13/18 0552 01/13/18 1501 01/13/18 2104 01/14/18 0558  BP: 127/67 (!) 161/85 (!) 159/88 (!) 146/84  Pulse: 87 76 79 83  Resp: 15 15 16 16   Temp: 98 F (36.7 C) 98.4 F (36.9 C) 98.8 F (37.1 C) 98.4 F (36.9 C)  TempSrc: Oral Oral Oral Oral  SpO2: 97% 100% 100% 100%  Weight:      Height:        General: Pt is alert, awake, not in acute distress Cardiovascular: RRR, S1/S2 +, no rubs, no gallops Respiratory: CTA bilaterally, no wheezing, no rhonchi Abdominal: Soft, NT, ND, bowel sounds + Extremities: no edema, no cyanosis   Discharge Instructions  Discharge Instructions    Diet general   Complete by:  As directed    Full liquid diet x 1 wk and then a high fiber diet   Increase activity slowly   Complete by:  As directed      Allergies as of 01/14/2018  No Known Allergies     Medication List    STOP taking these medications   aspirin 81 MG tablet   EX-LAX PO   hydrochlorothiazide 25 MG tablet Commonly known as:  HYDRODIURIL   ibuprofen 200 MG tablet Commonly known as:  ADVIL,MOTRIN   losartan 50 MG tablet Commonly known as:  COZAAR   naproxen 500 MG tablet Commonly known as:  NAPROSYN   naproxen sodium 220 MG tablet Commonly known as:  ALEVE     TAKE these medications   esomeprazole 40 MG capsule Commonly known as:  NEXIUM Take 1 capsule (40 mg total) by mouth daily.   feeding supplement (PRO-STAT SUGAR  FREE 64) Liqd Take 30 mLs by mouth 2 (two) times daily.   polycarbophil 625 MG tablet Commonly known as:  FIBERCON Take 1 tablet (625 mg total) by mouth 2 (two) times daily. Start taking on:  01/20/2018   polyethylene glycol packet Commonly known as:  MIRALAX / GLYCOLAX Take 17 Davila by mouth daily as needed for mild constipation.   pravastatin 20 MG tablet Commonly known as:  PRAVACHOL Take 1 tablet (20 mg total) by mouth daily.   tiZANidine 4 MG tablet Commonly known as:  ZANAFLEX Take 0.5-1 tablets (2-4 mg total) by mouth at bedtime. What changed:    when to take this  reasons to take this   TYLENOL 500 MG tablet Generic drug:  acetaminophen Take 1,000 mg by mouth every 6 (six) hours as needed (pain).      Follow-up Information    Armbruster, Carlota Raspberry, MD Follow up.   Specialty:  Gastroenterology Contact information: [place] [address] [phone]        Martin[place], [name], MD. Schedule an appointment as soon as possible for a visit in 1 week(s).   Specialty:  Family Medicine Contact information: [place] [address] [phone]          No Known Allergies   Procedures/Studies: Colonoscopy  Nm Gi Blood Loss  Result Date: 01/10/2018 CLINICAL DATA:  GI bleeding. EXAM: NUCLEAR MEDICINE GASTROINTESTINAL BLEEDING SCAN TECHNIQUE: Sequential abdominal images were obtained for 2 hours following intravenous administration of Tc-2m labeled red blood cells. RADIOPHARMACEUTICALS:  27.8 mCi Tc-65m pertechnetate in-vitro labeled red cells. COMPARISON:  CT scan 01/06/2018 FINDINGS: Planar imaging over 120 minutes shows no evidence for radiotracer accumulation over the abdomen/pelvis to suggest active GI bleeding. IMPRESSION: No active GI bleeding during the 2 hours of observation. Electronically Signed   By: Misty Stanley M.D.   On: 01/10/2018 13:36   Ct Abdomen Pelvis W Contrast  Result Date: 01/06/2018 CLINICAL DATA:   Lower abdominal pain with rectal bleeding and hematuria EXAM: CT ABDOMEN AND PELVIS WITH CONTRAST TECHNIQUE: Multidetector CT imaging of the abdomen and pelvis was performed using the standard protocol following bolus administration of intravenous contrast. CONTRAST:  160mL ISOVUE-300 IOPAMIDOL (ISOVUE-300) INJECTION 61% COMPARISON:  None. FINDINGS: Lower chest: No acute abnormality. Hepatobiliary: Gallbladder is been surgically removed. Mild biliary prominence is noted related to the post cholecystectomy state. A small 1 cm cyst is noted within the left lobe of the liver. Pancreas: Unremarkable. No pancreatic ductal dilatation or surrounding inflammatory changes. Spleen: Normal in size without focal abnormality. Adrenals/Urinary Tract: Adrenal glands are unremarkable. Kidneys are normal, without renal calculi, focal lesion, or hydronephrosis. Bladder is unremarkable. Stomach/Bowel: Considerable fecal material is noted within the rectal vault consistent with a mild fecal impaction diverticular change of the colon is  noted. No obstructive changes are seen. The appendix is within normal limits. Vascular/Lymphatic: No significant vascular findings are present. No enlarged abdominal or pelvic lymph nodes. Reproductive: Status post hysterectomy. No adnexal masses. Other: No abdominal wall hernia or abnormality. No abdominopelvic ascites. Musculoskeletal: Mild degenerative changes of the lumbar spine are noted. IMPRESSION: Changes consistent with fecal impaction in the rectum. Chronic changes as described above. Electronically Signed   By: Inez Catalina M.D.   On: 01/06/2018 14:09      The results of significant diagnostics from this hospitalization (including imaging, microbiology, ancillary and laboratory) are listed below for reference.     Microbiology: No results found for this or any previous visit (from the past 240 hour(s)).   Labs: BNP (last 3 results) No results for input(s): BNP in the last 8760  hours. Basic Metabolic Panel: Recent Labs  Lab 01/09/18 0637 01/11/18 0345 01/12/18 0901 01/13/18 0410  NA 141 142 144 143  K 3.1* 3.7 3.8 3.6  CL 108 111 112* 108  CO2 24 24 25 27   GLUCOSE 108* 99 92 98  BUN 8 15 <5* 8  CREATININE 0.87 0.94 0.84 0.81  CALCIUM 8.4* 8.5* 8.6* 8.7*   Liver Function Tests: Recent Labs  Lab 01/09/18 0637  AST 15  ALT 15  ALKPHOS 90  BILITOT 0.7  PROT 5.8*  ALBUMIN 3.4*   No results for input(s): LIPASE, AMYLASE in the last 168 hours. No results for input(s): AMMONIA in the last 168 hours. CBC: Recent Labs  Lab 01/08/18 0443  01/12/18 1803 01/13/18 0410 01/13/18 1034 01/13/18 1847 01/14/18 0250  WBC 5.7   < > 7.2 7.6 7.9 7.6 9.1  NEUTROABS 3.5  --   --   --   --   --   --   HGB 9.2*   < > 8.6* 10.7* 11.6* 10.5* 10.8*  HCT 29.7*   < > 27.0* 33.0* 35.4* 32.7* 33.3*  MCV 75.0*   < > 78.7 79.3 79.4 79.6 79.3  PLT 239   < > 288 273 291 266 309   < > = values in this interval not displayed.   Cardiac Enzymes: No results for input(s): CKTOTAL, CKMB, CKMBINDEX, TROPONINI in the last 168 hours. BNP: Invalid input(s): POCBNP CBG: No results for input(s): GLUCAP in the last 168 hours. D-Dimer No results for input(s): DDIMER in the last 72 hours. Hgb A1c No results for input(s): HGBA1C in the last 72 hours. Lipid Profile No results for input(s): CHOL, HDL, LDLCALC, TRIG, CHOLHDL, LDLDIRECT in the last 72 hours. Thyroid function studies No results for input(s): TSH, T4TOTAL, T3FREE, THYROIDAB in the last 72 hours.  Invalid input(s): FREET3 Anemia work up No results for input(s): VITAMINB12, FOLATE, FERRITIN, TIBC, IRON, RETICCTPCT in the last 72 hours. Urinalysis    Component Value Date/Time   COLORURINE YELLOW 01/06/2018 1314   APPEARANCEUR HAZY (A) 01/06/2018 1314   LABSPEC 1.026 01/06/2018 1314   PHURINE 5.0 01/06/2018 1314   GLUCOSEU NEGATIVE 01/06/2018 1314   HGBUR LARGE (A) 01/06/2018 1314   BILIRUBINUR NEGATIVE  01/06/2018 1314   KETONESUR 5 (A) 01/06/2018 1314   PROTEINUR 30 (A) 01/06/2018 1314   UROBILINOGEN 1.0 04/27/2015 1655   NITRITE NEGATIVE 01/06/2018 1314   LEUKOCYTESUR NEGATIVE 01/06/2018 1314   Sepsis Labs Invalid input(s): PROCALCITONIN,  WBC,  LACTICIDVEN Microbiology No results found for this or any previous visit (from the past 240 hour(s)).   Time coordinating discharge in minutes: 60  SIGNED:   Eunice Blase  Wynelle Cleveland, MD  [facility] 01/14/2018, 10:28 AM Pager   If 7PM-7AM, please contact night-coverage [email or web address] Password [email or web address]

## 2018-01-14 NOTE — Progress Notes (Addendum)
     [facility] Progress Note   Chief Complaint:   Lower GI bleeding   SUBJECTIVE:   No further bleeding. Had two normal BMs yesterday  ASSESSMENT AND PLAN:   1. 62 yo female with painless hematochezia. Suspect diverticular hemorrhage which has resolved. She hasn't had any further bleeding in 24 hours now.  -hgb stable at 10.8 as of around 2 am -Agree with discharge today.  -she will follow up with [place] as needed.   2.  Anemia of ABL.  Status post 2 units yesterday / total of 3 this admission. Hgb stable in mid 10 range.      Attending physician's note   I have taken an interval history, reviewed the chart and examined the patient. I agree with the Advanced Practitioner's note, impression and recommendations.  No further bleeding. Hb is stable. Resolved diverticular bleed. ABL anemia.  Full liquid to low residue diet for 5 days then resume prior diet.  No strenuous exertion for 1 week.  OK for discharge today from GI standpoint.  Post hospital follow up with PCP.  GI follow prn with Dr. Havery Moros.  GI signing off.   Lucio Edward, MD FACG [phone] office     OBJECTIVE:     Vital signs in last 24 hours: Temp:  [98.4 F (36.9 C)-98.8 F (37.1 C)] 98.4 F (36.9 C) (06/18 0558) Pulse Rate:  [76-83] 83 (06/18 0558) Resp:  [15-16] 16 (06/18 0558) BP: (146-161)/(84-88) 146/84 (06/18 0558) SpO2:  [100 %] 100 % (06/18 0558) Last BM Date: 01/13/18   General:   Alert, well-developed, female in NAD EENT:  Normal hearing, non icteric sclera, conjunctive pink.  Heart:  Regular rate and rhythm; no lower extremity edema Pulm: Normal respiratory effort Abdomen:  Soft, nondistended, nontender.  Normal bowel sounds  Neurologic:  Alert and  oriented x4;  grossly normal neurologically. Psych:  Pleasant, cooperative.  Normal mood and affect.   Intake/Output from previous day: 06/17 0701 - 06/18 0700 In: 3710.4 [P.O.:600; I.V.:3010.4; IV  Piggyback:100] Out: -  Intake/Output this shift: No intake/output data recorded.  Lab Results: Recent Labs    01/13/18 1034 01/13/18 1847 01/14/18 0250  WBC 7.9 7.6 9.1  HGB 11.6* 10.5* 10.8*  HCT 35.4* 32.7* 33.3*  PLT 291 266 309   BMET Recent Labs    01/12/18 0901 01/13/18 0410  NA 144 143  K 3.8 3.6  CL 112* 108  CO2 25 27  GLUCOSE 92 98  BUN <5* 8  CREATININE 0.84 0.81  CALCIUM 8.6* 8.7*     Discharge Planning Diet: Full liquid to low residue for 5 days then regular diet Discharge Medications: None from GI standpoint Follow up: GI as needed   Principal Problem:   Lower GI bleed Active Problems:   Hip pain   Hypertension, essential, benign   Hyperlipidemia   Morbid obesity with BMI of 40.0-44.9, adult (HCC)   GERD (gastroesophageal reflux disease)   Hypokalemia   Constipation   Diverticulosis of colon with hemorrhage    LOS: 4 days   Tye Savoy ,NP 01/14/2018, 9:31 AM

## 2018-01-14 NOTE — Progress Notes (Signed)
Discharge instructions reviewed with patient. Questions answered. Patient denies further questions. No prescriptions given to patient. Family member is coming to drive patient home. Donne Hazel, RN

## 2018-01-14 NOTE — Discharge Instructions (Signed)
Avoid Aspirin, Ibuprofen, Naproxen. You may use Tylenol.   You were cared for by a hospitalist during your hospital stay. If you have any questions about your discharge medications or the care you received while you were in the hospital after you are discharged, you can call the unit and asked to speak with the hospitalist on call if the hospitalist that took care of you is not available. Once you are discharged, your primary care physician will handle any further medical issues. Please note that NO REFILLS for any discharge medications will be authorized once you are discharged, as it is imperative that you return to your primary care physician (or establish a relationship with a primary care physician if you do not have one) for your aftercare needs so that they can reassess your need for medications and monitor your lab values.  Please take all your medications with you for your next visit with your Primary MD. Please ask your Primary MD to get all Hospital records sent to his/her office. Please request your Primary MD to go over all hospital test results at the follow up.    If you experience worsening of your admission symptoms, develop shortness of breath, chest pain, suicidal or homicidal thoughts or a life threatening emergency, you must seek medical attention immediately by calling 911 or calling your MD.   Dennis Bast must read the complete instructions/literature along with all the possible adverse reactions/side effects for all the medicines you take including new medications that have been prescribed to you. Take new medicines after you have completely understood and accpet all the possible adverse reactions/side effects.    Do not drive when taking pain medications or sedatives.     Do not take more than prescribed Pain, Sleep and Anxiety Medications   If you have smoked or chewed Tobacco in the last 2 yrs please stop. Stop any regular alcohol  and or recreational drug use.   Wear Seat belts  while driving.

## 2018-01-14 NOTE — Progress Notes (Signed)
Dr Wynelle Cleveland paged to complete medication reconciliation so DC paperwork can be printed. Donne Hazel, Therapist, sports.

## 2018-01-17 ENCOUNTER — Ambulatory Visit: Payer: BLUE CROSS/BLUE SHIELD | Admitting: Family Medicine

## 2018-01-20 ENCOUNTER — Ambulatory Visit: Payer: BLUE CROSS/BLUE SHIELD | Admitting: Family Medicine

## 2018-01-20 ENCOUNTER — Encounter: Payer: Self-pay | Admitting: Family Medicine

## 2018-01-20 VITALS — BP 142/82 | HR 91 | Temp 98.0°F | Resp 16 | Ht 62.0 in | Wt 234.6 lb

## 2018-01-20 DIAGNOSIS — K922 Gastrointestinal hemorrhage, unspecified: Secondary | ICD-10-CM | POA: Diagnosis not present

## 2018-01-20 DIAGNOSIS — M79605 Pain in left leg: Secondary | ICD-10-CM

## 2018-01-20 DIAGNOSIS — K219 Gastro-esophageal reflux disease without esophagitis: Secondary | ICD-10-CM | POA: Diagnosis not present

## 2018-01-20 DIAGNOSIS — M79604 Pain in right leg: Secondary | ICD-10-CM | POA: Diagnosis not present

## 2018-01-20 DIAGNOSIS — E876 Hypokalemia: Secondary | ICD-10-CM | POA: Diagnosis not present

## 2018-01-20 DIAGNOSIS — I10 Essential (primary) hypertension: Secondary | ICD-10-CM

## 2018-01-20 LAB — BASIC METABOLIC PANEL
BUN: 13 mg/dL (ref 6–23)
CO2: 31 mEq/L (ref 19–32)
Calcium: 9.2 mg/dL (ref 8.4–10.5)
Chloride: 103 mEq/L (ref 96–112)
Creatinine, Ser: 0.88 mg/dL (ref 0.40–1.20)
GFR: 83.13 mL/min (ref >60.00)
Glucose, Bld: 101 mg/dL — ABNORMAL HIGH (ref 70–99)
Potassium: 3.6 mEq/L (ref 3.5–5.1)
Sodium: 142 mEq/L (ref 135–145)

## 2018-01-20 LAB — CBC
HCT: 34.7 % — ABNORMAL LOW (ref 36.0–46.0)
Hemoglobin: 11.2 g/dL — ABNORMAL LOW (ref 12.0–15.0)
MCHC: 32.4 g/dL (ref 30.0–36.0)
MCV: 79.3 fl (ref 78.0–100.0)
Platelets: 328 10*3/uL (ref 150.0–400.0)
RBC: 4.38 Mil/uL (ref 3.87–5.11)
RDW: 16.7 % — ABNORMAL HIGH (ref 11.5–15.5)
WBC: 7.3 10*3/uL (ref 4.0–10.5)

## 2018-01-20 MED ORDER — ACETAMINOPHEN-CODEINE #3 300-30 MG PO TABS: 1.0000 | ORAL_TABLET | ORAL | 0 refills | Status: DC | PRN

## 2018-01-20 NOTE — Progress Notes (Signed)
HPI:   [name] is a 62 y.o. female, who is here today to follow on recent hospitalization.   She was admitted on 01/06/2018 on discharge on 01/14/2018.  She presented to the ER complaining of blood in stool. History of diverticulosis. Discharge diagnosis: Lower GI bleeding, constipation, diverticulosis of colon with hemorrhage. She received 1 unit of RBC on 01/10/18.  Aspirin, NSAIDs,Losartan, HCTZ were held.  Gastroenterology consultation was done during hospitalization, Dr. Havery Moros. Colonoscopy done on 01/08/2018.  Red blood with clots and diverticulosis in the entire colon, internal hemorrhoids. Diverticular bleeding was suspected as the source of rectal bleeding.  According to patient, she is supposed to follow-up as needed.  CT abdomen/pelvis with contrast: Changes consistent with fecal impaction in the rectum.Chronic changes as described above.   Lab Results  Component Value Date   WBC 9.1 01/14/2018   HGB 10.8 (L) 01/14/2018   HCT 33.3 (L) 01/14/2018   MCV 79.3 01/14/2018   PLT 309 01/14/2018   She has not had episodes of rectal bleeding since her discharge. Upper abdominal pain once a few days ago, it woke her up in the middle the night.  She is having a small soft bowel movements, which she attributes to being on liquid diet. She is currently on MiraLAX daily and today she is supposed to start [place]. Her last bowel movement was last night.  No nausea or vomiting. She is tolerating liquid diet well.  She was discharged home, home health arrangements were necessary.    GERD: + Heartburn and acid reflux. She did not pick up Nexium, which was prescribed last visit.   Hypertension: Antihypertensive medications were discontinued to prevent hypotension due to bleeding. She had some elevated BP during hospitalization, 140s-150s/80s.   Lab Results  Component Value Date   CREATININE 0.81 01/13/2018   BUN 8 01/13/2018   NA 143  01/13/2018   K 3.6 01/13/2018   CL 108 01/13/2018   CO2 27 01/13/2018   HCTZ was added last visit to help with BP and LE edema.  She is not checking BP home.  Denies severe/frequent headache, visual changes, chest pain, dyspnea, palpitation, claudication, focal weakness, or worsening edema.   LE pain:  She was on ibuprofen 800 mg daily, which was the "only" medication that was helping with pain. According to patient, it was suggested to take Tylenol 3 for pain. She has had bilateral leg pain for a while, R>L.  She is following with orthopedist. I recommended Cymbalta 30 mg in 08/2017 but she did not feel like it was helpful. She has also been on gabapentin, did not help either.  She describes pain as shock like, posterior aspect of thighs.   Occasionally she has lower back pain, exacerbated by prolonged standing and prolonged walking. Negative for saddle anesthesia, urine/bowel incontinence, or lower extremity numbness or tingling. She reported some mild weakness on lower extremities, exacerbated by getting up after prolonged sitting.    Review of Systems  Constitutional: Positive for fatigue. Negative for activity change, appetite change and fever.  HENT: Negative for mouth sores, nosebleeds, sore throat and trouble swallowing.   Eyes: Negative for redness and visual disturbance.  Respiratory: Negative for cough, shortness of breath and wheezing.   Cardiovascular: Negative for chest pain, palpitations and leg swelling.  Gastrointestinal: Negative for anal bleeding, nausea and vomiting.  Genitourinary: Negative for decreased urine volume, dysuria and hematuria.  Musculoskeletal: Positive for back pain and myalgias. Negative for gait problem.  Skin: Negative for pallor and rash.  Neurological: Negative for syncope, weakness and headaches.  Hematological: Negative for adenopathy. Does not bruise/bleed easily.  Psychiatric/Behavioral: Positive for sleep disturbance. Negative for  confusion. The patient is nervous/anxious.       Current Outpatient Medications on File Prior to Visit  Medication Sig Dispense Refill  . acetaminophen (TYLENOL) 500 MG tablet Take 1,000 mg by mouth every 6 (six) hours as needed (pain).    . Amino Acids-Protein Hydrolys (FEEDING SUPPLEMENT, PRO-STAT SUGAR FREE 64,) LIQD Take 30 mLs by mouth 2 (two) times daily. 900 mL 0  . esomeprazole (NEXIUM) 40 MG capsule Take 1 capsule (40 mg total) by mouth daily. 90 capsule 3  . polycarbophil (FIBERCON) 625 MG tablet Take 1 tablet (625 mg total) by mouth 2 (two) times daily. 60 tablet 0  . polyethylene glycol (MIRALAX / GLYCOLAX) packet Take 17 g by mouth daily as needed for mild constipation. 14 each 0  . pravastatin (PRAVACHOL) 20 MG tablet Take 1 tablet (20 mg total) by mouth daily. 90 tablet 1  . tiZANidine (ZANAFLEX) 4 MG tablet Take 0.5-1 tablets (2-4 mg total) by mouth at bedtime. (Patient taking differently: Take 2-4 mg by mouth as needed. ) 30 tablet 1   No current facility-administered medications on file prior to visit.      Past Medical History:  Diagnosis Date  . Arthritis   . Constipation    on stool softener  . GERD (gastroesophageal reflux disease)   . Hyperlipidemia   . Hypertension   . Trouble swallowing 2013   following lap band   No Known Allergies  Social History   Socioeconomic History  . Marital status: Married    Spouse name: Not on file  . Number of children: 1  . Years of education: Not on file  . Highest education level: Not on file  Occupational History  . Not on file  Social Needs  . Financial resource strain: Not on file  . Food insecurity:    Worry: Not on file    Inability: Not on file  . Transportation needs:    Medical: Not on file    Non-medical: Not on file  Tobacco Use  . Smoking status: Former Smoker    Types: Cigarettes    Last attempt to quit: 10/02/1972    Years since quitting: 45.3  . Smokeless tobacco: Never Used  Substance and  Sexual Activity  . Alcohol use: No  . Drug use: No  . Sexual activity: Never  Lifestyle  . Physical activity:    Days per week: Not on file    Minutes per session: Not on file  . Stress: Not on file  Relationships  . Social connections:    Talks on phone: Not on file    Gets together: Not on file    Attends religious service: Not on file    Active member of club or organization: Not on file    Attends meetings of clubs or organizations: Not on file    Relationship status: Not on file  Other Topics Concern  . Not on file  Social History Narrative  . Not on file    Vitals:   01/20/18 0849  BP: (!) 142/82  Pulse: 91  Resp: 16  Temp: 98 F (36.7 C)  SpO2: 98%   Body mass index is 42.91 kg/m.   Physical Exam  Nursing note and vitals reviewed. Constitutional: She is oriented to person, place, and time. She appears  well-developed. No distress.  HENT:  Head: Normocephalic and atraumatic.  Mouth/Throat: Oropharynx is clear and moist and mucous membranes are normal.  Eyes: Pupils are equal, round, and reactive to light. Conjunctivae are normal.  Cardiovascular: Normal rate and regular rhythm.  No murmur heard. Pulses:      Dorsalis pedis pulses are 2+ on the right side, and 2+ on the left side.  Respiratory: Effort normal and breath sounds normal. No respiratory distress.  GI: Soft. She exhibits no mass. There is no hepatomegaly. There is no tenderness.  Musculoskeletal: She exhibits edema (pedal 1+ non pitting LE edema,bilateral.). She exhibits no tenderness.  No pain upon palpation of lumbar paraspinal muscles or lower extremity muscles, posterior aspect of thighs. Leg pain is exacerbated by leg extension on exam table.   Lymphadenopathy:    She has no cervical adenopathy.  Neurological: She is alert and oriented to person, place, and time. She has normal strength. Gait normal.  Skin: Skin is warm. No erythema.  Psychiatric: Her mood appears anxious.  Well groomed,  good eye contact.    ASSESSMENT AND PLAN:  [name] was seen today for hospitalization follow-up.  Orders Placed This Encounter  Procedures  . MR Lumbar Spine Wo Contrast  . Basic metabolic panel  . CBC   Lab Results  Component Value Date   WBC 7.3 01/20/2018   HGB 11.2 (L) 01/20/2018   HCT 34.7 (L) 01/20/2018   MCV 79.3 01/20/2018   PLT 328.0 01/20/2018   Lab Results  Component Value Date   CREATININE 0.88 01/20/2018   BUN 13 01/20/2018   NA 142 01/20/2018   K 3.6 01/20/2018   CL 103 01/20/2018   CO2 31 01/20/2018     Lower GI bleed  She has not had any rectal bleeding since hospital discharge. Continue adequate fiber intake as well as MiraLAX as needed to prevent constipation. Recommend advancing diet as tolerated. Instructed about warning signs.   Leg pain Because of recent GI bleed, she cannot take NSAIDs. She will try Tylenol 3 at bedtime, some side effects discussed. We will arrange lumbar MRI to evaluate for lumbar radiculopathy. Follow-up in 4 weeks.  GERD (gastroesophageal reflux disease) Still symptomatic. She will pick up Nexium today. GERD precautions also discussed. Follow-up in 4 weeks.  Hypertension, essential, benign BP elevated. She will resume losartan 50 mg and HCTZ 25 mg. Recommend monitoring BP at home. Continue low-salt diet. Follow-up in 4 weeks.  Hypokalemia For now she will continue potassium containing food intake. Further recommendation will be given according to BMP results.      Jennifer Leyland G. [place], MD  [facility]. [place] office.

## 2018-01-20 NOTE — Assessment & Plan Note (Signed)
Still symptomatic. She will pick up Nexium today. GERD precautions also discussed. Follow-up in 4 weeks.

## 2018-01-20 NOTE — Assessment & Plan Note (Signed)
BP elevated. She will resume losartan 50 mg and HCTZ 25 mg. Recommend monitoring BP at home. Continue low-salt diet. Follow-up in 4 weeks.

## 2018-01-20 NOTE — Assessment & Plan Note (Signed)
Because of recent GI bleed, she cannot take NSAIDs. She will try Tylenol 3 at bedtime, some side effects discussed. We will arrange lumbar MRI to evaluate for lumbar radiculopathy. Follow-up in 4 weeks.

## 2018-01-20 NOTE — Patient Instructions (Addendum)
A few things to remember from today's visit:   Hypertension, essential, benign - Plan: Basic metabolic panel  Lower GI bleed - Plan: CBC  Hypokalemia - Plan: Basic metabolic panel  Pain in both lower extremities - Plan: acetaminophen-codeine (TYLENOL #3) 300-30 MG tablet, MR Lumbar Spine Wo Contrast  Tylenol # 3 at bedtime as needed.   Advance diet as tolerated. Start Nexium.  Please be sure medication list is accurate. If a new problem present, please set up appointment sooner than planned today.

## 2018-01-20 NOTE — Assessment & Plan Note (Signed)
For now she will continue potassium containing food intake. Further recommendation will be given according to BMP results.

## 2018-01-22 ENCOUNTER — Other Ambulatory Visit: Payer: Self-pay | Admitting: Family Medicine

## 2018-01-22 DIAGNOSIS — E785 Hyperlipidemia, unspecified: Secondary | ICD-10-CM

## 2018-01-23 ENCOUNTER — Ambulatory Visit
Admission: RE | Admit: 2018-01-23 | Discharge: 2018-01-23 | Disposition: A | Discharge status: Home / Self Care | Payer: BLUE CROSS/BLUE SHIELD | Source: Ambulatory Visit | Attending: Family Medicine | Admitting: Family Medicine

## 2018-01-23 DIAGNOSIS — M79604 Pain in right leg: Secondary | ICD-10-CM

## 2018-01-23 DIAGNOSIS — M79605 Pain in left leg: Principal | ICD-10-CM

## 2018-01-25 ENCOUNTER — Other Ambulatory Visit: Payer: BLUE CROSS/BLUE SHIELD

## 2018-01-27 ENCOUNTER — Telehealth: Payer: Self-pay | Admitting: Family Medicine

## 2018-01-27 NOTE — Telephone Encounter (Signed)
Left VM to return call 

## 2018-01-27 NOTE — Telephone Encounter (Signed)
Copied from [place] [phone]. Topic: Quick Communication - Lab Results >> Jan 24, 2018  1:35 PM Zacarias Pontes, CMA wrote: Called patient to inform them of their lab results. When patient returns call, triage nurse may disclose results.  Pt returning call about lab results. Please call

## 2018-01-29 ENCOUNTER — Telehealth: Payer: Self-pay | Admitting: Family Medicine

## 2018-01-29 NOTE — Telephone Encounter (Signed)
Copied from [place] [phone]. Topic: General - Other >> Jan 29, 2018  9:07 AM Yvette Rack wrote: Reason for CRM: pt calling back about lab results

## 2018-01-29 NOTE — Telephone Encounter (Signed)
Left vm to call back for results.  

## 2018-01-31 ENCOUNTER — Other Ambulatory Visit: Payer: Self-pay | Admitting: Family Medicine

## 2018-01-31 DIAGNOSIS — M79605 Pain in left leg: Principal | ICD-10-CM

## 2018-01-31 DIAGNOSIS — M79604 Pain in right leg: Secondary | ICD-10-CM

## 2018-02-10 ENCOUNTER — Ambulatory Visit: Payer: BLUE CROSS/BLUE SHIELD | Admitting: Family Medicine

## 2018-03-04 ENCOUNTER — Ambulatory Visit: Payer: BLUE CROSS/BLUE SHIELD | Admitting: Family Medicine

## 2018-03-23 ENCOUNTER — Other Ambulatory Visit: Payer: Self-pay | Admitting: Family Medicine

## 2018-03-23 DIAGNOSIS — I10 Essential (primary) hypertension: Secondary | ICD-10-CM

## 2018-04-09 ENCOUNTER — Telehealth: Payer: Self-pay | Admitting: Family Medicine

## 2018-04-09 NOTE — Telephone Encounter (Signed)
Copied from [place]. Topic: General - Other >> Apr 09, 2018  9:33 AM Berneta Levins wrote: Caren Griffins a Nurse from [facility] [place] calling as a courtesy to let physician know that pt has enrolled in case management program with [employer] [place] and Caren Griffins will be providing nursing support and education to pt over the phone.  If there is anyway that Caren Griffins can be of assistance to the physician just let her know. Caren Griffins can be reached at [phone]

## 2018-04-09 NOTE — Telephone Encounter (Signed)
Fyi sent to Dr. Martinique.

## 2018-04-20 NOTE — Progress Notes (Deleted)
HPI:   [name] is a 62 y.o. female, who is here today for *** months follow up.   *** was last seen on ***  Since *** last OV she has ***   {4+ HPI elements (or status of 3 or more chronic diseases)} ***   Hypertension:    Currently on Losartan-HCTZ 50-25 mg daily.    ***taking medications as instructed, no side effects reported.  ***has not noted unusual headache, visual changes, exertional chest pain, dyspnea,  focal weakness, or edema.   Lab Results  Component Value Date   CREATININE 0.88 01/20/2018   BUN 13 01/20/2018   NA 142 01/20/2018   K 3.6 01/20/2018   CL 103 01/20/2018   CO2 31 01/20/2018     GERD:  She is on Nexium ***  Leg pain: Lumbar MRI 01/23/18:  L4-5: Bilateral facet arthropathy with edema. Anterolisthesis of 3 mm that could worsen with standing or flexion. Bulging of the disc. Canal and foraminal stenosis that could be symptomatic. The facet arthropathy could also be a cause of back pain or referred facet syndrome pain. This appearance could worsen with standing or flexion.  L5-S1: Facet osteoarthritis right more than left, which similarly could be symptomatic. Mild right foraminal narrowing without definite neural compression. 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  Review of Systems  [review of 2 to 9 systems] ***  Current Outpatient Medications on File Prior to Visit  Medication Sig Dispense Refill  . acetaminophen (TYLENOL) 500 MG tablet Take 1,000 mg by mouth every 6 (six) hours as needed (pain).    Marland Kitchen acetaminophen-codeine (TYLENOL #3) 300-30 MG tablet Take 1 tablet by mouth every 4 (four) hours as needed for moderate pain. 20 tablet 0  . Amino Acids-Protein Hydrolys (FEEDING SUPPLEMENT, PRO-STAT SUGAR FREE 64,) LIQD Take 30 mLs by mouth 2 (two) times daily. 900 mL 0  . esomeprazole (NEXIUM) 40 MG capsule Take 1 capsule (40 mg total) by mouth  daily. 90 capsule 3  . losartan (COZAAR) 50 MG tablet TAKE 1 TABLET BY MOUTH EVERY DAY 90 tablet 1  . polycarbophil (FIBERCON) 625 MG tablet Take 1 tablet (625 mg total) by mouth 2 (two) times daily. 60 tablet 0  . polyethylene glycol (MIRALAX / GLYCOLAX) packet Take 17 g by mouth daily as needed for mild constipation. 14 each 0  . pravastatin (PRAVACHOL) 20 MG tablet TAKE 1 TABLET BY MOUTH EVERY DAY 90 tablet 1  . tiZANidine (ZANAFLEX) 4 MG tablet Take 0.5-1 tablets (2-4 mg total) by mouth at bedtime. (Patient taking differently: Take 2-4 mg by mouth as needed. ) 30 tablet 1   No current facility-administered medications on file prior to visit.      Past Medical History:  Diagnosis Date  . Arthritis   . Constipation    on stool softener  . GERD (gastroesophageal reflux disease)   . Hyperlipidemia   . Hypertension   . Trouble swallowing 2013   following lap band   No Known Allergies  Social History   Socioeconomic History  . Marital status: Married    Spouse name: Not on file  . Number of children: 1  . Years of education: Not on file  . Highest education level: Not on file  Occupational History  . Not on file  Social Needs  . Financial resource strain: Not on file  . Food insecurity:    Worry: Not  on file    Inability: Not on file  . Transportation needs:    Medical: Not on file    Non-medical: Not on file  Tobacco Use  . Smoking status: Former Smoker    Types: Cigarettes    Last attempt to quit: 10/02/1972    Years since quitting: 45.5  . Smokeless tobacco: Never Used  Substance and Sexual Activity  . Alcohol use: No  . Drug use: No  . Sexual activity: Never  Lifestyle  . Physical activity:    Days per week: Not on file    Minutes per session: Not on file  . Stress: Not on file  Relationships  . Social connections:    Talks on phone: Not on file    Gets together: Not on file    Attends religious service: Not on file    Active member of club or  organization: Not on file    Attends meetings of clubs or organizations: Not on file    Relationship status: Not on file  Other Topics Concern  . Not on file  Social History Narrative  . Not on file    There were no vitals filed for this visit. There is no height or weight on file to calculate BMI.      Physical Exam  {[12+ exam elements]} ***  ASSESSMENT AND PLAN:   [name] was seen today for *** months follow-up.  No orders of the defined types were placed in this encounter.   No problem-specific Assessment & Plan notes found for this encounter.           -[name] was advised to return sooner than planned today if new concerns arise.       Jennifer Davila G. [place], MD  [facility]. [place] office.

## 2018-04-21 ENCOUNTER — Ambulatory Visit: Payer: BLUE CROSS/BLUE SHIELD | Admitting: Family Medicine

## 2018-04-22 NOTE — Progress Notes (Signed)
HPI:   Jennifer Davila is a 62 y.o. female, who is here today for 3-4 months follow up.   She was last seen on 01/20/18.       Hypertension:   Currently on Losartan-HCTZ 50-25 mg daily.   Home BP's: Not checking. She is taking medications as instructed, no side effects reported.  She has not noted unusual headache, visual changes, exertional chest pain, dyspnea,  focal weakness, or edema.   Lab Results  Component Value Date   CREATININE 0.88 01/20/2018   BUN 13 01/20/2018   NA 142 01/20/2018   K 3.6 01/20/2018   CL 103 01/20/2018   CO2 31 01/20/2018     GERD:  She is on Nexium 40 mg daily. 01/06/18 seen because GI bleed.  Denies abdominal pain, nausea, vomiting, changes in bowel habits, blood in stool or melena.  Iron def anemia. She is not on iron supplementation. Diverticulosis with hemorrhage.   Lab Results  Component Value Date   WBC 7.3 01/20/2018   HGB 11.2 (L) 01/20/2018   HCT 34.7 (L) 01/20/2018   MCV 79.3 01/20/2018   PLT 328.0 01/20/2018    Leg pain: R>L. Ibuprofen has helped in the past. Cymbalta and Gabapentin did not help.  She followed with ortho and was recommended to call back if she was interested in surgery.  She started with new onset of achy pain post thigh.9-10/10,intermittent. She has not identified exacerbating or alleviating factor.  Leg pain is exacerbated by prolonged sitting,alleviated by standing up.  No numbness,tingling,or burning.    Lumbar MRI 01/23/18:  L4-5: Bilateral facet arthropathy with edema. Anterolisthesis of 3 mm that could worsen with standing or flexion. Bulging of the disc. Canal and foraminal stenosis that could be symptomatic. The facet arthropathy could also be a cause of back pain or referred facet syndrome pain. This appearance could worsen with standing or flexion.  L5-S1: Facet osteoarthritis right more than left, which similarly could be symptomatic. Mild right foraminal  narrowing without definite neural compression. 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  Obesity:  Dietary changes since last OV: Not consistently, eats corn chips. Exercise: Walking 2-3 times per week.    Review of Systems  Constitutional: Negative for activity change, appetite change, fatigue, fever and unexpected weight change.  HENT: Negative for mouth sores, nosebleeds and trouble swallowing.   Eyes: Negative for redness and visual disturbance.  Respiratory: Negative for cough, shortness of breath and wheezing.   Cardiovascular: Negative for chest pain, palpitations and leg swelling.  Gastrointestinal: Negative for abdominal pain, nausea and vomiting.       Negative for changes in bowel habits.  Genitourinary: Negative for decreased urine volume, difficulty urinating, dysuria and hematuria.  Musculoskeletal: Positive for myalgias. Negative for gait problem and joint swelling.  Skin: Negative for color change and rash.  Neurological: Negative for syncope, weakness, numbness and headaches.     Current Outpatient Medications on File Prior to Visit  Medication Sig Dispense Refill  . acetaminophen (TYLENOL) 500 MG tablet Take 1,000 mg by mouth every 6 (six) hours as needed (pain).    Marland Kitchen acetaminophen-codeine (TYLENOL #3) 300-30 MG tablet Take 1 tablet by mouth every 4 (four) hours as needed for moderate pain. 20 tablet 0  . Amino Acids-Protein Hydrolys (FEEDING SUPPLEMENT, PRO-STAT SUGAR FREE 64,) LIQD Take 30 mLs by mouth 2 (two) times daily. 900 mL 0  . esomeprazole (NEXIUM) 40  MG capsule Take 1 capsule (40 mg total) by mouth daily. 90 capsule 3  . hydrochlorothiazide (HYDRODIURIL) 25 MG tablet   0  . losartan (COZAAR) 50 MG tablet TAKE 1 TABLET BY MOUTH EVERY DAY 90 tablet 1  . polycarbophil (FIBERCON) 625 MG tablet Take 1 tablet (625 mg total) by mouth 2 (two) times daily. 60 tablet 0  . polyethylene glycol (MIRALAX /  GLYCOLAX) packet Take 17 g by mouth daily as needed for mild constipation. 14 each 0  . pravastatin (PRAVACHOL) 20 MG tablet TAKE 1 TABLET BY MOUTH EVERY DAY 90 tablet 1  . tiZANidine (ZANAFLEX) 4 MG tablet Take 0.5-1 tablets (2-4 mg total) by mouth at bedtime. (Patient taking differently: Take 2-4 mg by mouth as needed. ) 30 tablet 1   No current facility-administered medications on file prior to visit.      Past Medical History:  Diagnosis Date  . Arthritis   . Constipation    on stool softener  . GERD (gastroesophageal reflux disease)   . Hyperlipidemia   . Hypertension   . Trouble swallowing 2013   following lap band   No Known Allergies  Social History   Socioeconomic History  . Marital status: Married    Spouse name: Not on file  . Number of children: 1  . Years of education: Not on file  . Highest education level: Not on file  Occupational History  . Not on file  Social Needs  . Financial resource strain: Not on file  . Food insecurity:    Worry: Not on file    Inability: Not on file  . Transportation needs:    Medical: Not on file    Non-medical: Not on file  Tobacco Use  . Smoking status: Former Smoker    Types: Cigarettes    Last attempt to quit: 10/02/1972    Years since quitting: 45.5  . Smokeless tobacco: Never Used  Substance and Sexual Activity  . Alcohol use: No  . Drug use: No  . Sexual activity: Never  Lifestyle  . Physical activity:    Days per week: Not on file    Minutes per session: Not on file  . Stress: Not on file  Relationships  . Social connections:    Talks on phone: Not on file    Gets together: Not on file    Attends religious service: Not on file    Active member of club or organization: Not on file    Attends meetings of clubs or organizations: Not on file    Relationship status: Not on file  Other Topics Concern  . Not on file  Social History Narrative  . Not on file    Vitals:   04/23/18 0922  BP: 124/82  Pulse: 67   Resp: 12  Temp: 98 F (36.7 C)  SpO2: 99%   Body mass index is 43.39 kg/m. Wt Readings from Last 3 Encounters:  04/23/18 237 lb 4 oz (107.6 kg)  01/20/18 234 lb 9.6 oz (106.4 kg)  01/08/18 240 lb (108.9 kg)     Physical Exam  Nursing note and vitals reviewed. Constitutional: She is oriented to person, place, and time. She appears well-developed. No distress.  HENT:  Head: Normocephalic and atraumatic.  Mouth/Throat: Oropharynx is clear and moist and mucous membranes are normal.  Eyes: Pupils are equal, round, and reactive to light. Conjunctivae are normal.  Cardiovascular: Normal rate and regular rhythm.  No murmur heard. Pulses:  Dorsalis pedis pulses are 2+ on the right side, and 2+ on the left side.  Respiratory: Effort normal and breath sounds normal. No respiratory distress.  GI: Soft. She exhibits no mass. There is no hepatomegaly. There is no tenderness.  Musculoskeletal: She exhibits no edema.  Pain upon palpation of right great trochanteric bursa. External hip rotation elicits anterior thigh pain. Hip flexion elicits posterior thigh pain.   Lymphadenopathy:    She has no cervical adenopathy.  Neurological: She is alert and oriented to person, place, and time. She has normal strength. No cranial nerve deficit. Gait normal.  Skin: Skin is warm. No rash noted. No erythema.  Psychiatric: She has a normal mood and affect.  Well groomed, good eye contact.       ASSESSMENT AND PLAN:   Ms. Leydy Worthey was seen today for 3 months follow-up.  Orders Placed This Encounter  Procedures  . Flu Vaccine QUAD 36+ mos IM  . CBC  . Ambulatory referral to Physical Therapy   Lab Results  Component Value Date   WBC 5.5 04/23/2018   HGB 11.8 (L) 04/23/2018   HCT 36.9 04/23/2018   MCV 71.4 (L) 04/23/2018   PLT 306.0 04/23/2018    1. Gastroesophageal reflux disease, esophagitis presence not specified Well controlled. For now no changes in current  management. GERD precautions discussed. Some side effects of PPI meds discussed.  2. Hypertension, essential, benign Adequately controlled. No changes in current management. DASH-low salt diet to continue. Eye exam recommended annually. F/U in 6 months, before if needed.   3. Pain in both lower extremities Possible etiologies, ? Radicular,vein disease,hamstrings tightness, and trochanteric bursitis among some to consider. She is not interested in trying sterid injection in right great trochanteric bursa.  Gabapentin and Cymbalta have not helped. Side effects of chronic NSAID's use. PT may help,referral placed.  - Ambulatory referral to Physical Therapy - meloxicam (MOBIC) 15 MG tablet; Take 1 tablet (15 mg total) by mouth daily as needed for pain.  Dispense: 30 tablet; Refill: 1  4. Iron deficiency anemia, unspecified iron deficiency anemia type Further recommendations will be given according to cbc results.  - CBC  5. Need for immunization against influenza - Flu Vaccine QUAD 36+ mos IM       Jennifer Stegmaier G. Martinique, MD  [facility]. [place] office.

## 2018-04-23 ENCOUNTER — Ambulatory Visit: Payer: BLUE CROSS/BLUE SHIELD | Admitting: Family Medicine

## 2018-04-23 ENCOUNTER — Encounter: Payer: Self-pay | Admitting: Family Medicine

## 2018-04-23 VITALS — BP 124/82 | HR 67 | Temp 98.0°F | Resp 12 | Ht 62.0 in | Wt 237.2 lb

## 2018-04-23 DIAGNOSIS — M79605 Pain in left leg: Secondary | ICD-10-CM

## 2018-04-23 DIAGNOSIS — D509 Iron deficiency anemia, unspecified: Secondary | ICD-10-CM

## 2018-04-23 DIAGNOSIS — Z23 Encounter for immunization: Secondary | ICD-10-CM

## 2018-04-23 DIAGNOSIS — K219 Gastro-esophageal reflux disease without esophagitis: Secondary | ICD-10-CM

## 2018-04-23 DIAGNOSIS — M79604 Pain in right leg: Secondary | ICD-10-CM

## 2018-04-23 DIAGNOSIS — I10 Essential (primary) hypertension: Secondary | ICD-10-CM | POA: Diagnosis not present

## 2018-04-23 LAB — CBC
HCT: 36.9 % (ref 36.0–46.0)
Hemoglobin: 11.8 g/dL — ABNORMAL LOW (ref 12.0–15.0)
MCHC: 32.1 g/dL (ref 30.0–36.0)
MCV: 71.4 fl — ABNORMAL LOW (ref 78.0–100.0)
Platelets: 306 10*3/uL (ref 150.0–400.0)
RBC: 5.17 Mil/uL — ABNORMAL HIGH (ref 3.87–5.11)
RDW: 14.7 % (ref 11.5–15.5)
WBC: 5.5 10*3/uL (ref 4.0–10.5)

## 2018-04-23 MED ORDER — MELOXICAM 15 MG PO TABS: 15.0000 mg | ORAL_TABLET | Freq: Every day | ORAL | 1 refills | Status: DC | PRN

## 2018-04-23 NOTE — Patient Instructions (Addendum)
A few things to remember from today's visit:   Gastroesophageal reflux disease, esophagitis presence not specified  Hypertension, essential, benign  Pain in both lower extremities - Plan: Ambulatory referral to Physical Therapy, meloxicam (MOBIC) 15 MG tablet  Take muscle relaxant daily at night. Try compression stockings.  Vein disease is a condition that can affect the veins in the legs. It can cause leg pain, varicose veins, swollen legs, or open sores. Varicose veins are swollen and twisted veins.   Compression stockings- [facility] in [place]   Please be sure medication list is accurate. If a new problem present, please set up appointment sooner than planned today.

## 2018-04-27 ENCOUNTER — Encounter: Payer: Self-pay | Admitting: Family Medicine

## 2018-05-14 ENCOUNTER — Ambulatory Visit: Payer: BLUE CROSS/BLUE SHIELD | Attending: Family Medicine | Admitting: Physical Therapy

## 2018-05-14 ENCOUNTER — Ambulatory Visit (INDEPENDENT_AMBULATORY_CARE_PROVIDER_SITE_OTHER): Payer: BLUE CROSS/BLUE SHIELD | Admitting: *Deleted

## 2018-05-14 ENCOUNTER — Other Ambulatory Visit: Payer: Self-pay

## 2018-05-14 DIAGNOSIS — M6281 Muscle weakness (generalized): Secondary | ICD-10-CM | POA: Diagnosis present

## 2018-05-14 DIAGNOSIS — Z111 Encounter for screening for respiratory tuberculosis: Secondary | ICD-10-CM | POA: Diagnosis not present

## 2018-05-14 DIAGNOSIS — R262 Difficulty in walking, not elsewhere classified: Secondary | ICD-10-CM

## 2018-05-14 DIAGNOSIS — R293 Abnormal posture: Secondary | ICD-10-CM | POA: Diagnosis present

## 2018-05-14 NOTE — Patient Instructions (Signed)

## 2018-05-14 NOTE — Therapy (Signed)
[facility] [address] [place], [place], [address] Phone: [phone]   Fax:  [phone]  Physical Therapy Evaluation  Patient Details  Name: Jennifer Davila MRN: [id number] Date of Birth: 1956/06/07 Referring Provider (PT): [name]   Encounter Date: 05/14/2018  PT End of Session - 05/14/18 0911    Visit Number  1    Date for PT Re-Evaluation  07/09/18    Authorization Type  [employer]    PT Start Time  0851    PT Stop Time  0925    PT Time Calculation (min)  34 min    Activity Tolerance  Patient tolerated treatment well    Behavior During Therapy  [facility] for tasks assessed/performed       Past Medical History:  Diagnosis Date  . Arthritis   . Constipation    on stool softener  . GERD (gastroesophageal reflux disease)   . Hyperlipidemia   . Hypertension   . Trouble swallowing 2013   following lap band    Past Surgical History:  Procedure Laterality Date  . ABDOMINAL HYSTERECTOMY    . CHOLECYSTECTOMY    . COLONOSCOPY N/A 01/08/2018   Procedure: COLONOSCOPY;  Surgeon: Yetta Flock, MD;  Location: [facility];  Service: Gastroenterology;  Laterality: N/A;  . LAPAROSCOPIC GASTRIC BANDING    . LAPAROSCOPIC REPAIR AND REMOVAL OF GASTRIC BAND  2013-2014    There were no vitals filed for this visit.   Subjective Assessment - 05/14/18 0903    Subjective  I am having sharp pain down the back of both leg and my muscle cramp when I am stretching.  I have been have some unsteadiness on my feet too.      Limitations  Sitting    How long can you sit comfortably?  not limited    How long can you walk comfortably?  an hour of doing housework then it starts to hurt    Patient Stated Goals  want to manage the pain and get back to exercise    Currently in Pain?  Yes    Pain Score  7     Pain Location  Leg    Pain Orientation  Right;Left;Proximal    Pain Descriptors / Indicators  Cramping;Sharp    Pain Type  Chronic pain    Pain Radiating Towards  buttocks down back of the thigh    Pain Onset  More than a month ago    Pain Frequency  Intermittent    Aggravating Factors   wakes in my sleep at least 1x/night, sitting too long    Pain Relieving Factors  stretching feels good but doesn't help long term    Effect of Pain on Daily Activities  have to sit sometimes when trying to do housework, feel unsteady    Multiple Pain Sites  No         [facility] PT Assessment - 05/14/18 0001      Assessment   Medical Diagnosis  M79.604,M79.605 (ICD-10-CM) - Pain in both lower extremities    Referring Provider (PT)  [name]    Onset Date/Surgical Date  --   last year and has gotten worse   Prior Therapy  No      Precautions   Precautions  None      Restrictions   Weight Bearing Restrictions  No      Balance Screen   Has the patient fallen in the past 6 months  No  Home Environment   Living Environment  Private residence    Living Arrangements  Spouse/significant other      Prior Function   Level of Independence  Independent      Cognition   Overall Cognitive Status  Within Functional Limits for tasks assessed      Observation/Other Assessments   Focus on Therapeutic Outcomes (FOTO)   46% limited      Posture/Postural Control   Posture/Postural Control  Postural limitations    Postural Limitations  Rounded Shoulders;Decreased lumbar lordosis;Anterior pelvic tilt      ROM / Strength   AROM / PROM / Strength  PROM;Strength      Strength   Overall Strength Comments  core 4/5    Strength Assessment Site  Hip;Knee    Right/Left Hip  Right;Left    Right Hip Flexion  4+/5    Right Hip Extension  3+/5    Right Hip External Rotation   4-/5    Right Hip ABduction  4/5    Right Hip ADduction  3-/5    Left Hip Flexion  4+/5    Left Hip Extension  4-/5    Left Hip External Rotation  4+/5    Left Hip ABduction  4+/5    Left Hip ADduction  4-/5    Right/Left Knee  Right;Left     Right Knee Flexion  4-/5    Left Knee Flexion  4/5      Flexibility   Soft Tissue Assessment /Muscle Length  yes    Hamstrings  not limited      Palpation   Palpation comment  glutes, piriformis, lumbar paraspinals tight      Special Tests    Special Tests  Lumbar    Lumbar Tests  Straight Leg Raise      Straight Leg Raise   Findings  Negative      Ambulation/Gait   Gait Pattern  Lateral hip instability      Standardized Balance Assessment   Standardized Balance Assessment  Five Times Sit to Stand    Five times sit to stand comments   18 sec                Objective measurements completed on examination: See above findings.      [facility] PT Treatment/Exercise - 05/14/18 0001      Self-Care   Self-Care  Other Self-Care Comments    Other Self-Care Comments   initial HEP and DN info             PT Education - 05/14/18 0929    Education Details   Access Code: [email or web address] and DN info    Person(s) Educated  Patient    Methods  Explanation;Demonstration;Verbal cues;Handout    Comprehension  Verbalized understanding;Returned demonstration       PT Short Term Goals - 05/14/18 0933      PT SHORT TERM GOAL #1   Title  pt will be ind with inital HEP    Time  2    Period  Weeks    Status  New    Target Date  05/28/18        PT Long Term Goals - 05/14/18 0933      PT LONG TERM GOAL #1   Title  ind with advanced HEP    Time  8    Period  Weeks    Status  New    Target Date  07/09/18  PT LONG TERM GOAL #2   Title  able to be ind with program of walking or cardio equipment at the gym at least 20 min x 3 days/week    Time  8    Period  Weeks    Status  New    Target Date  07/09/18      PT LONG TERM GOAL #3   Title  pt will demonstrate 5x sit to stand in 12 seconds or less due to improved strength and to demonstrate reduced risk of falls    Time  8    Period  Weeks    Status  New    Target Date  07/09/18      PT LONG TERM GOAL #4    Title  Pt will report 50% less occurance of cramping symptoms    Time  8    Period  Weeks    Status  New    Target Date  07/09/18      PT LONG TERM GOAL #5   Title  Pt will be able to complete 2 hours of housework without having to sit due to leg pain    Time  8    Period  Weeks    Status  New    Target Date  07/09/18             Plan - 05/14/18 0949    Clinical Impression Statement  Pt presents to clinic due to some LE pain and cramping that she has had more frequently over the past year.  Pt has muscle spasms thoughout calves, hamstrings, glutes and lumbar paraspinals.  Pt has some limited ROM of hip IR/ER.  She demonstrates weakness throughout core and LE as noted above.  Pt has postural abnormalities as mentioned above.  She will benefit from skilled PT to address these impairments so she can return to maximum function and get back to an exercise routine as part of a healthy lifestyle    Clinical Presentation  Evolving    Clinical Presentation due to:  pt symptoms getting worse     Clinical Decision Making  Low    Rehab Potential  Excellent    PT Frequency  2x / week   reduce to 1x for transition to HEP   PT Duration  8 weeks    PT Treatment/Interventions  ADLs/Self Care Home Management;Cryotherapy;Electrical Stimulation;Iontophoresis 4mg /ml Dexamethasone;Moist Heat;Traction;Therapeutic activities;Therapeutic exercise;Neuromuscular re-education;Patient/family education;Manual techniques;Dry needling;Passive range of motion;Taping    PT Next Visit Plan  discussed DN and assess if appropriate on calves, hamstrings. glutes, lumbar; LE strength, endurance, core strength and balance    PT Home Exercise Plan  Access Code: [email or web address]     Recommended Other Services  eval 05/14/18    Consulted and Agree with Plan of Care  Patient       Patient will benefit from skilled therapeutic intervention in order to improve the following deficits and impairments:  Abnormal gait, Pain, Increased  fascial restricitons, Decreased strength, Decreased range of motion, Difficulty walking, Postural dysfunction, Increased muscle spasms  Visit Diagnosis: Muscle weakness (generalized)  Difficulty in walking, not elsewhere classified  Abnormal posture     Problem List Patient Active Problem List   Diagnosis Date Noted  . Diverticulosis of colon with hemorrhage   . Leg pain 01/06/2018  . Bilateral lower extremity edema 01/06/2018  . Lower GI bleed 01/06/2018  . Hypokalemia 01/06/2018  . Constipation 01/06/2018  . Leg cramping 10/18/2017  . Vitamin D deficiency, unspecified 10/18/2017  .  GERD (gastroesophageal reflux disease) 10/18/2017  . Hypertension, essential, benign 09/04/2017  . Hyperlipidemia 09/04/2017  . Morbid obesity with BMI of 40.0-44.9, adult ([place]) 09/04/2017  . Vertigo 09/04/2017  . Hip pain 05/25/2015    Wayne Both 05/14/2018, 9:57 AM  [facility] [address], [place], [place], [address] Phone: [phone]   Fax:  [phone]  Name: Jennifer Davila MRN: [id number] Date of Birth: 1956/07/04

## 2018-05-14 NOTE — Progress Notes (Signed)
Tuberculin skin test applied to LEFT ventral forearm today, Wednesday 05/14/18. Explained to patient that she must return to office within the next 48-72 hours to have the test read to ensure not positive. Pt expressed understanding and states that she plans to come back Friday 05/16/18 around noon.   Varney Daily, CMA

## 2018-05-16 LAB — TB SKIN TEST
Induration: 48 mm
TB Skin Test: NEGATIVE

## 2018-06-02 ENCOUNTER — Telehealth: Payer: Self-pay

## 2018-06-02 ENCOUNTER — Ambulatory Visit: Payer: BLUE CROSS/BLUE SHIELD | Attending: Family Medicine

## 2018-06-02 DIAGNOSIS — M6281 Muscle weakness (generalized): Secondary | ICD-10-CM | POA: Insufficient documentation

## 2018-06-02 DIAGNOSIS — R262 Difficulty in walking, not elsewhere classified: Secondary | ICD-10-CM | POA: Insufficient documentation

## 2018-06-02 DIAGNOSIS — R293 Abnormal posture: Secondary | ICD-10-CM | POA: Insufficient documentation

## 2018-06-02 NOTE — Telephone Encounter (Signed)
PT called and left message on voicemail.  Pt missed 8:00 PT appointment today.

## 2018-06-04 ENCOUNTER — Ambulatory Visit: Payer: BLUE CROSS/BLUE SHIELD

## 2018-06-04 DIAGNOSIS — R293 Abnormal posture: Secondary | ICD-10-CM

## 2018-06-04 DIAGNOSIS — M6281 Muscle weakness (generalized): Secondary | ICD-10-CM

## 2018-06-04 DIAGNOSIS — R262 Difficulty in walking, not elsewhere classified: Secondary | ICD-10-CM | POA: Diagnosis present

## 2018-06-04 NOTE — Patient Instructions (Signed)
Access Code: [email or web address]  URL: [email or web address]  Date: 06/04/2018  Prepared by: Sigurd Sos   Exercises  Seated Hamstring Stretch - 3 reps - 20 hold - 3x daily - 7x weekly  Seated Long Arc Quad - 10 reps - 2 sets - 5 hold - 3x daily - 7x weekly  Seated Figure 4 Piriformis Stretch - 3 reps - 20 hold - 3x daily - 7x weekly

## 2018-06-04 NOTE — Therapy (Addendum)
[facility] [address] [place], [place], [address] Phone: [phone]   Fax:  [phone]  Physical Therapy Treatment  Patient Details  Name: Naileah Karg MRN: [id number] Date of Birth: 05/22/1956 Referring Provider (PT): [name]   Encounter Date: 06/04/2018  PT End of Session - 06/04/18 0840    Visit Number  2    Date for PT Re-Evaluation  07/09/18    Authorization Type  [employer]    PT Start Time  0800    PT Stop Time  0842    PT Time Calculation (min)  42 min    Activity Tolerance  Patient tolerated treatment well    Behavior During Therapy  [facility] for tasks assessed/performed       Past Medical History:  Diagnosis Date  . Arthritis   . Constipation    on stool softener  . GERD (gastroesophageal reflux disease)   . Hyperlipidemia   . Hypertension   . Trouble swallowing 2013   following lap band    Past Surgical History:  Procedure Laterality Date  . ABDOMINAL HYSTERECTOMY    . CHOLECYSTECTOMY    . COLONOSCOPY N/A 01/08/2018   Procedure: COLONOSCOPY;  Surgeon: Yetta Flock, MD;  Location: [facility];  Service: Gastroenterology;  Laterality: N/A;  . LAPAROSCOPIC GASTRIC BANDING    . LAPAROSCOPIC REPAIR AND REMOVAL OF GASTRIC BAND  2013-2014    There were no vitals filed for this visit.  Subjective Assessment - 06/04/18 0759    Subjective  I am stiff today.  I lost my exercise handout so I didn't do my exercises.    Currently in Pain?  Yes    Pain Score  6    not pain, just stiffness   Pain Location  Leg    Pain Orientation  Left;Right    Pain Descriptors / Indicators  Cramping;Sharp    Pain Onset  More than a month ago    Pain Frequency  Intermittent    Aggravating Factors   it is just there, somtimes when I sit too long    Pain Relieving Factors  stretching                       [facility] Adult PT Treatment/Exercise - 06/04/18 0001      Exercises   Exercises  Knee/Hip;Lumbar      Lumbar Exercises: Stretches   Active Hamstring Stretch  Left;Right;3 reps;20 seconds    Single Knee to Chest Stretch  Left;Right;3 reps;20 seconds    Lower Trunk Rotation  3 reps;20 seconds      Lumbar Exercises: Aerobic   Nustep  Level 1x 8 minutes   PT present to discuss progress     Lumbar Exercises: Supine   Straight Leg Raise  20 reps   abdominal bracing     Knee/Hip Exercises: Seated   Long Arc Quad  Strengthening;Both;2 sets;10 reps    Sit to [employer]  20 reps;without UE support   cues for core activation            PT Education - 06/04/18 0809    Education Details   Access Code: [email or web address]     Person(s) Educated  Patient    Methods  Explanation;Demonstration;Handout    Comprehension  Verbalized understanding;Returned demonstration       PT Short Term Goals - 05/14/18 0933      PT SHORT TERM GOAL #1   Title  pt will be ind  with inital HEP    Time  2    Period  Weeks    Status  New    Target Date  05/28/18        PT Long Term Goals - 05/14/18 0933      PT LONG TERM GOAL #1   Title  ind with advanced HEP    Time  8    Period  Weeks    Status  New    Target Date  07/09/18      PT LONG TERM GOAL #2   Title  able to be ind with program of walking or cardio equipment at the gym at least 20 min x 3 days/week    Time  8    Period  Weeks    Status  New    Target Date  07/09/18      PT LONG TERM GOAL #3   Title  pt will demonstrate 5x sit to stand in 12 seconds or less due to improved strength and to demonstrate reduced risk of falls    Time  8    Period  Weeks    Status  New    Target Date  07/09/18      PT LONG TERM GOAL #4   Title  Pt will report 50% less occurance of cramping symptoms    Time  8    Period  Weeks    Status  New    Target Date  07/09/18      PT LONG TERM GOAL #5   Title  Pt will be able to complete 2 hours of housework without having to sit due to leg pain    Time  8    Period  Weeks    Status   New    Target Date  07/09/18            Plan - 06/04/18 1610    Clinical Impression Statement  Pt with first time follow-up after evaluation.  Pt lost her exercise handout so hasn't started on her HEP.   PT reviewed HEP today and added exercise for LE flexibility and strength.  Pt required tactile and demo cues for technique to reduce trunk flexion.  Pt with stiffness in bil hips and lumbar spine and will continue to benefit from skilled PT for strength, flexibility and manual/modalities as needed for pain.      Rehab Potential  Excellent    PT Frequency  2x / week    PT Duration  8 weeks    PT Treatment/Interventions  ADLs/Self Care Home Management;Cryotherapy;Electrical Stimulation;Iontophoresis 55m/ml Dexamethasone;Moist Heat;Traction;Therapeutic activities;Therapeutic exercise;Neuromuscular re-education;Patient/family education;Manual techniques;Dry needling;Passive range of motion;Taping    PT Next Visit Plan  review all HEP, LE strength, flexibility, manual as needed    PT Home Exercise Plan  Access Code: [email or web address]    Recommended Other Services  initial certification is signed    Consulted and Agree with Plan of Care  Patient       Patient will benefit from skilled therapeutic intervention in order to improve the following deficits and impairments:  Abnormal gait, Pain, Increased fascial restricitons, Decreased strength, Decreased range of motion, Difficulty walking, Postural dysfunction, Increased muscle spasms  Visit Diagnosis: Muscle weakness (generalized)  Difficulty in walking, not elsewhere classified  Abnormal posture     Problem List Patient Active Problem List   Diagnosis Date Noted  . Diverticulosis of colon with hemorrhage   . Leg pain 01/06/2018  . Bilateral lower extremity  edema 01/06/2018  . Lower GI bleed 01/06/2018  . Hypokalemia 01/06/2018  . Constipation 01/06/2018  . Leg cramping 10/18/2017  . Vitamin D deficiency, unspecified 10/18/2017  . GERD  (gastroesophageal reflux disease) 10/18/2017  . Hypertension, essential, benign 09/04/2017  . Hyperlipidemia 09/04/2017  . Morbid obesity with BMI of 40.0-44.9, adult ([place]) 09/04/2017  . Vertigo 09/04/2017  . Hip pain 05/25/2015    Sigurd Sos, PT 06/04/18 8:41 AM  [facility] [address] [place], [place], [address] Phone: [phone]   Fax:  [phone]  Name: Wilhemenia Camba MRN: [id number] Date of Birth: [birth year]  PHYSICAL THERAPY DISCHARGE SUMMARY  Visits from Start of Care: 2  Current functional level related to goals / functional outcomes: See above   Remaining deficits: See above   Education / Equipment: HEP  Plan: Patient agrees to discharge.  Patient goals were not met. Patient is being discharged due to not returning since the last visit.  ?????    [employer], PT 07/03/18 11:03 AM

## 2018-06-10 ENCOUNTER — Ambulatory Visit: Payer: BLUE CROSS/BLUE SHIELD

## 2018-06-10 ENCOUNTER — Telehealth: Payer: Self-pay

## 2018-06-10 NOTE — Telephone Encounter (Signed)
PT called pt due to no-show for PT appointment.  Left message on voicemail.

## 2018-06-12 ENCOUNTER — Ambulatory Visit: Payer: BLUE CROSS/BLUE SHIELD

## 2018-06-17 ENCOUNTER — Encounter: Payer: BLUE CROSS/BLUE SHIELD | Admitting: Physical Therapy

## 2018-06-19 ENCOUNTER — Encounter: Payer: BLUE CROSS/BLUE SHIELD | Admitting: Physical Therapy

## 2018-06-23 ENCOUNTER — Other Ambulatory Visit: Payer: Self-pay | Admitting: *Deleted

## 2018-06-23 MED ORDER — LOSARTAN POTASSIUM 25 MG PO TABS: ORAL_TABLET | ORAL | 1 refills | Status: DC

## 2018-06-24 ENCOUNTER — Ambulatory Visit: Payer: Self-pay | Admitting: *Deleted

## 2018-06-24 ENCOUNTER — Encounter: Payer: BLUE CROSS/BLUE SHIELD | Admitting: Physical Therapy

## 2018-06-24 NOTE — Telephone Encounter (Signed)
  Reason for Disposition . MILD rectal bleeding (more than just a few drops or streaks)    Patient is calling to report she has had rectal bleeding on tissue and in stool- she is very concerned due to her history of rectal bleed and hospitalization for that. Appointment made.  Answer Assessment - Initial Assessment Questions 1. APPEARANCE of BLOOD: "What color is it?" "Is it passed separately, on the surface of the stool, or mixed in with the stool?"      Bright red when wiped, mixed in with stool  2. AMOUNT: "How much blood was passed?"      Not a whole lot 3. FREQUENCY: "How many times has blood been passed with the stools?"      Today- once 4. ONSET: "When was the blood first seen in the stools?" (Days or weeks)      today 5. DIARRHEA: "Is there also some diarrhea?" If so, ask: "How many diarrhea stools were passed in past 24 hours?"      no 6. CONSTIPATION: "Do you have constipation?" If so, "How bad is it?"     No- patient is using stool softener  7. RECURRENT SYMPTOMS: "Have you had blood in your stools before?" If so, ask: "When was the last time?" and "What happened that time?"      Yes- patient was in the hospital for this, patient has to have transfusion 8. BLOOD THINNERS: "Do you take any blood thinners?" (e.g., Coumadin/warfarin, Pradaxa/dabigatran, aspirin)     no 9. OTHER SYMPTOMS: "Do you have any other symptoms?"  (e.g., abdominal pain, vomiting, dizziness, fever)     No- at times some nausea 10. PREGNANCY: "Is there any chance you are pregnant?" "When was your last menstrual period?"       n/a  Protocols used: RECTAL BLEEDING-A-AH

## 2018-06-25 ENCOUNTER — Encounter: Payer: Self-pay | Admitting: Family Medicine

## 2018-06-25 ENCOUNTER — Ambulatory Visit: Payer: BLUE CROSS/BLUE SHIELD | Admitting: Family Medicine

## 2018-06-25 VITALS — BP 130/83 | HR 79 | Temp 98.0°F | Resp 12 | Ht 62.0 in | Wt 240.2 lb

## 2018-06-25 DIAGNOSIS — M79605 Pain in left leg: Secondary | ICD-10-CM | POA: Diagnosis not present

## 2018-06-25 DIAGNOSIS — K922 Gastrointestinal hemorrhage, unspecified: Secondary | ICD-10-CM

## 2018-06-25 DIAGNOSIS — M79604 Pain in right leg: Secondary | ICD-10-CM | POA: Diagnosis not present

## 2018-06-25 DIAGNOSIS — K219 Gastro-esophageal reflux disease without esophagitis: Secondary | ICD-10-CM

## 2018-06-25 LAB — CBC
HCT: 35.6 % — ABNORMAL LOW (ref 36.0–46.0)
Hemoglobin: 11.1 g/dL — ABNORMAL LOW (ref 12.0–15.0)
MCHC: 31.2 g/dL (ref 30.0–36.0)
MCV: 72.9 fl — ABNORMAL LOW (ref 78.0–100.0)
Platelets: 275 10*3/uL (ref 150.0–400.0)
RBC: 4.89 Mil/uL (ref 3.87–5.11)
RDW: 14.8 % (ref 11.5–15.5)
WBC: 6.5 10*3/uL (ref 4.0–10.5)

## 2018-06-25 NOTE — Patient Instructions (Addendum)
A few things to remember from today's visit:   Lower GI bleed - Plan: CBC, Ambulatory referral to Gastroenterology  Gastroesophageal reflux disease, esophagitis presence not specified  Pain in both lower extremities  Stop Meloxicam for now. Take acetaminophen for leg pain. No changes on omeprazole for now. Avoid constipation and straining.  Please be sure medication list is accurate. If a new problem present, please set up appointment sooner than planned today.

## 2018-06-25 NOTE — Progress Notes (Signed)
ACUTE VISIT   HPI:  Chief Complaint  Patient presents with  . Rectal Bleeding    recurrent blood in stool, started yesterday    Jennifer Davila is a 62 y.o. female, who is here today complaining of rectal bleeding. Problem started yesterday,sudden onset. She has blood with every stool,so avoiding defecation.  Denies abdominal pain,vomiting, changes in bowel habits, or melena. + Nausea. Hx of GERD,she has not had heartburn. She takes Nexium 40 mg daily.   Blood is mixed with stool and on tissue after defecation. Denies dyschezia. Softer stool.  Colonoscopy done on 01/16/2018.  She is been taking MiraLAX daily prn,it is helping with constipation.  She takes Mobic daily for right lower extremity pain. Pain interferes with sleep and Mobic has helped.   Denies gum/nose bleeding,easy bruising, or gross hematuria.  She was admitted to the hospital in 12/2017 due to similar problem.   Review of Systems  Constitutional: Negative for activity change, appetite change, fatigue, fever and unexpected weight change.  HENT: Negative for mouth sores, sore throat and trouble swallowing.   Respiratory: Negative for cough, shortness of breath and wheezing.   Gastrointestinal: Positive for blood in stool and nausea. Negative for abdominal distention, abdominal pain, constipation and vomiting.  Endocrine: Negative for cold intolerance and heat intolerance.  Genitourinary: Negative for dysuria, frequency, hematuria, menstrual problem, vaginal bleeding and vaginal discharge.  Musculoskeletal: Negative for arthralgias, back pain and neck pain.  Hematological: Negative for adenopathy. Does not bruise/bleed easily.      Current Outpatient Medications on File Prior to Visit  Medication Sig Dispense Refill  . acetaminophen (TYLENOL) 500 MG tablet Take 1,000 mg by mouth daily as needed for moderate pain (pain).     Marland Kitchen esomeprazole (NEXIUM) 40 MG capsule Take 1 capsule (40 mg  total) by mouth daily. 90 capsule 3  . hydrochlorothiazide (HYDRODIURIL) 25 MG tablet Take 25 mg by mouth daily.   0  . losartan (COZAAR) 50 MG tablet TAKE 1 TABLET BY MOUTH EVERY DAY 90 tablet 1  . polycarbophil (FIBERCON) 625 MG tablet Take 1 tablet (625 mg total) by mouth 2 (two) times daily. 60 tablet 0  . pravastatin (PRAVACHOL) 20 MG tablet TAKE 1 TABLET BY MOUTH EVERY DAY 90 tablet 1   No current facility-administered medications on file prior to visit.      Past Medical History:  Diagnosis Date  . Arthritis   . Constipation    on stool softener  . GERD (gastroesophageal reflux disease)   . Hyperlipidemia   . Hypertension   . Trouble swallowing 2013   following lap band   No Known Allergies  Social History   Socioeconomic History  . Marital status: Married    Spouse name: Not on file  . Number of children: 1  . Years of education: Not on file  . Highest education level: Not on file  Occupational History  . Not on file  Social Needs  . Financial resource strain: Not on file  . Food insecurity:    Worry: Not on file    Inability: Not on file  . Transportation needs:    Medical: Not on file    Non-medical: Not on file  Tobacco Use  . Smoking status: Former Smoker    Types: Cigarettes    Last attempt to quit: 10/02/1972    Years since quitting: 45.7  . Smokeless tobacco: Never Used  Substance and Sexual Activity  . Alcohol use: No  .  Drug use: No  . Sexual activity: Never  Lifestyle  . Physical activity:    Days per week: Not on file    Minutes per session: Not on file  . Stress: Not on file  Relationships  . Social connections:    Talks on phone: Not on file    Gets together: Not on file    Attends religious service: Not on file    Active member of club or organization: Not on file    Attends meetings of clubs or organizations: Not on file    Relationship status: Not on file  Other Topics Concern  . Not on file  Social History Narrative  . Not on  file    Vitals:   06/25/18 0734  BP: 130/83  Pulse: 79  Resp: 12  Temp: 98 F (36.7 C)  SpO2: 98%   Body mass index is 43.94 kg/m.   Physical Exam  Nursing note and vitals reviewed. Constitutional: She is oriented to person, place, and time. She appears well-developed. She does not appear ill. No distress.  HENT:  Head: Normocephalic and atraumatic.  Mouth/Throat: Oropharynx is clear and moist and mucous membranes are normal.  Eyes: Conjunctivae are normal. No scleral icterus.  Cardiovascular: Normal rate and regular rhythm.  No murmur heard. Respiratory: Effort normal and breath sounds normal. No respiratory distress.  GI: Soft. Bowel sounds are normal. She exhibits no distension and no mass. There is no hepatomegaly. There is tenderness in the epigastric area.  Genitourinary: Rectal exam shows guaiac positive stool. Rectal exam shows no mass, no tenderness and anal tone normal.  Genitourinary Comments: Blood on glove + dark stool.   Musculoskeletal: She exhibits no edema.  Lymphadenopathy:    She has no cervical adenopathy.       Right: No supraclavicular adenopathy present.       Left: No supraclavicular adenopathy present.  Neurological: She is alert and oriented to person, place, and time. She has normal strength.  Skin: Skin is warm. No rash noted. No erythema.  Psychiatric: Her mood appears anxious.  Well groomed, good eye contact.      ASSESSMENT AND PLAN:  Jennifer Davila was seen today for rectal bleeding.  Diagnoses and all orders for this visit:   3d ago 37mo ago 26mo ago   WBC 4.0 - 10.5 K/uL 6.5  5.5  7.3   RBC 3.87 - 5.11 Mil/uL 4.89  5.17High   4.38   Platelets 150.0 - 400.0 K/uL 275.0  306.0  328.0   Hemoglobin 12.0 - 15.0 g/dL 11.1Low   11.8Low   11.2Low    HCT 36.0 - 46.0 % 35.6Low   36.9  34.7Low    MCV 78.0 - 100.0 fl 72.9Low   71.4Low   79.3   MCHC 30.0 - 36.0 g/dL 31.2  32.1  32.4   RDW 11.5 - 15.5 % 14.8      Lower GI bleed Clearly  instructed about warning signs, she voices understanding. Avoid straining or prolonged toilet time. Further recommendations will be given according to CBC result. GI referral placed.  -     CBC -     Ambulatory referral to Gastroenterology  Gastroesophageal reflux disease, esophagitis presence not specified Continue Nexium 40 mg daily. GERD precautions to continue.  Pain in both lower extremities Chronic. Stop Mobic and try Tylenol for now.    Return if symptoms worsen or fail to improve.       Junette Bernat G. Martinique, MD  [facility]. [place] office.

## 2018-06-26 ENCOUNTER — Encounter (HOSPITAL_COMMUNITY): Payer: Self-pay

## 2018-06-26 ENCOUNTER — Observation Stay (HOSPITAL_COMMUNITY)
Admission: EM | Admit: 2018-06-26 | Discharge: 2018-06-28 | Disposition: A | Discharge status: Home / Self Care | Payer: BLUE CROSS/BLUE SHIELD | Attending: Internal Medicine | Admitting: Internal Medicine

## 2018-06-26 DIAGNOSIS — K5731 Diverticulosis of large intestine without perforation or abscess with bleeding: Secondary | ICD-10-CM | POA: Diagnosis not present

## 2018-06-26 DIAGNOSIS — I10 Essential (primary) hypertension: Secondary | ICD-10-CM | POA: Diagnosis not present

## 2018-06-26 DIAGNOSIS — N179 Acute kidney failure, unspecified: Secondary | ICD-10-CM | POA: Diagnosis not present

## 2018-06-26 DIAGNOSIS — K921 Melena: Principal | ICD-10-CM | POA: Diagnosis present

## 2018-06-26 DIAGNOSIS — K219 Gastro-esophageal reflux disease without esophagitis: Secondary | ICD-10-CM | POA: Diagnosis not present

## 2018-06-26 DIAGNOSIS — Z6841 Body Mass Index (BMI) 40.0 and over, adult: Secondary | ICD-10-CM

## 2018-06-26 DIAGNOSIS — E876 Hypokalemia: Secondary | ICD-10-CM | POA: Diagnosis present

## 2018-06-26 DIAGNOSIS — Z79899 Other long term (current) drug therapy: Secondary | ICD-10-CM | POA: Insufficient documentation

## 2018-06-26 DIAGNOSIS — Z87891 Personal history of nicotine dependence: Secondary | ICD-10-CM | POA: Diagnosis not present

## 2018-06-26 DIAGNOSIS — E785 Hyperlipidemia, unspecified: Secondary | ICD-10-CM | POA: Diagnosis present

## 2018-06-26 DIAGNOSIS — K625 Hemorrhage of anus and rectum: Secondary | ICD-10-CM | POA: Diagnosis present

## 2018-06-26 DIAGNOSIS — D500 Iron deficiency anemia secondary to blood loss (chronic): Secondary | ICD-10-CM | POA: Diagnosis not present

## 2018-06-26 DIAGNOSIS — E669 Obesity, unspecified: Secondary | ICD-10-CM

## 2018-06-26 LAB — COMPREHENSIVE METABOLIC PANEL
ALT: 14 U/L (ref 0–44)
AST: 17 U/L (ref 15–41)
Albumin: 4.1 g/dL (ref 3.5–5.0)
Alkaline Phosphatase: 74 U/L (ref 38–126)
Anion gap: 7 (ref 5–15)
BUN: 24 mg/dL — ABNORMAL HIGH (ref 8–23)
CO2: 29 mmol/L (ref 22–32)
Calcium: 8.6 mg/dL — ABNORMAL LOW (ref 8.9–10.3)
Chloride: 104 mmol/L (ref 98–111)
Creatinine, Ser: 1.02 mg/dL — ABNORMAL HIGH (ref 0.44–1.00)
GFR calc Af Amer: >60 mL/min (ref >60)
GFR calc non Af Amer: 58 mL/min — ABNORMAL LOW (ref >60)
Glucose, Bld: 98 mg/dL (ref 70–99)
Potassium: 3.1 mmol/L — ABNORMAL LOW (ref 3.5–5.1)
Sodium: 140 mmol/L (ref 135–145)
Total Bilirubin: 0.7 mg/dL (ref 0.3–1.2)
Total Protein: 7.3 g/dL (ref 6.5–8.1)

## 2018-06-26 LAB — CBC
HCT: 34 % — ABNORMAL LOW (ref 36.0–46.0)
HCT: 31.1 % — ABNORMAL LOW (ref 36.0–46.0)
Hemoglobin: 10.2 g/dL — ABNORMAL LOW (ref 12.0–15.0)
Hemoglobin: 9.2 g/dL — ABNORMAL LOW (ref 12.0–15.0)
MCH: 23 pg — ABNORMAL LOW (ref 26.0–34.0)
MCH: 22.7 pg — ABNORMAL LOW (ref 26.0–34.0)
MCHC: 30 g/dL (ref 30.0–36.0)
MCHC: 29.6 g/dL — ABNORMAL LOW (ref 30.0–36.0)
MCV: 76.6 fL — ABNORMAL LOW (ref 80.0–100.0)
MCV: 76.6 fL — ABNORMAL LOW (ref 80.0–100.0)
Platelets: 265 10*3/uL (ref 150–400)
Platelets: 265 10*3/uL (ref 150–400)
RBC: 4.44 MIL/uL (ref 3.87–5.11)
RBC: 4.06 MIL/uL (ref 3.87–5.11)
RDW: 15 % (ref 11.5–15.5)
RDW: 15 % (ref 11.5–15.5)
WBC: 7.1 10*3/uL (ref 4.0–10.5)
WBC: 10.1 10*3/uL (ref 4.0–10.5)
nRBC: 0 % (ref 0.0–0.2)
nRBC: 0 % (ref 0.0–0.2)

## 2018-06-26 LAB — TYPE AND SCREEN
ABO/RH(D): O POS
Antibody Screen: NEGATIVE

## 2018-06-26 LAB — POC OCCULT BLOOD, ED: Fecal Occult Bld: POSITIVE — AB

## 2018-06-26 MED ORDER — PRAVASTATIN SODIUM 20 MG PO TABS
20.0000 mg | ORAL_TABLET | Freq: Every day | ORAL | Status: DC
  Administered 2018-06-26 – 2018-06-27 (×2): 20 mg via ORAL
  Filled 2018-06-26 (×2): qty 1

## 2018-06-26 MED ORDER — POLYETHYLENE GLYCOL 3350 17 G PO PACK: 17.0000 g | PACK | Freq: Every day | ORAL | Status: DC | PRN

## 2018-06-26 MED ORDER — SODIUM CHLORIDE 0.9 % IV BOLUS
500.0000 mL | Freq: Once | INTRAVENOUS | Status: AC
  Administered 2018-06-26: 500 mL via INTRAVENOUS

## 2018-06-26 MED ORDER — PANTOPRAZOLE SODIUM 40 MG PO TBEC
40.0000 mg | DELAYED_RELEASE_TABLET | Freq: Every day | ORAL | Status: DC
  Administered 2018-06-26 – 2018-06-28 (×3): 40 mg via ORAL
  Filled 2018-06-26 (×3): qty 1

## 2018-06-26 MED ORDER — SODIUM CHLORIDE 0.9 % IV SOLN
INTRAVENOUS | Status: AC
  Administered 2018-06-26 – 2018-06-27 (×2): via INTRAVENOUS

## 2018-06-26 MED ORDER — ONDANSETRON HCL 4 MG PO TABS: 4.0000 mg | ORAL_TABLET | Freq: Four times a day (QID) | ORAL | Status: DC | PRN

## 2018-06-26 MED ORDER — ACETAMINOPHEN 500 MG PO TABS
1000.0000 mg | ORAL_TABLET | Freq: Every day | ORAL | Status: DC | PRN
  Administered 2018-06-26 – 2018-06-27 (×2): 1000 mg via ORAL
  Filled 2018-06-26 (×2): qty 2

## 2018-06-26 MED ORDER — MORPHINE SULFATE (PF) 4 MG/ML IV SOLN
4.0000 mg | Freq: Once | INTRAVENOUS | Status: AC
  Administered 2018-06-26: 4 mg via INTRAVENOUS
  Filled 2018-06-26: qty 1

## 2018-06-26 MED ORDER — ONDANSETRON HCL 4 MG/2ML IJ SOLN: 4.0000 mg | Freq: Four times a day (QID) | INTRAMUSCULAR | Status: DC | PRN

## 2018-06-26 MED ORDER — SODIUM CHLORIDE 0.9% FLUSH
3.0000 mL | Freq: Two times a day (BID) | INTRAVENOUS | Status: DC
  Administered 2018-06-26 – 2018-06-28 (×2): 3 mL via INTRAVENOUS

## 2018-06-26 MED ORDER — POTASSIUM CHLORIDE 20 MEQ PO PACK
40.0000 meq | PACK | Freq: Two times a day (BID) | ORAL | Status: AC
  Administered 2018-06-26 – 2018-06-27 (×2): 40 meq via ORAL
  Filled 2018-06-26 (×2): qty 2

## 2018-06-26 NOTE — ED Provider Notes (Signed)
[facility] DEPT Provider Note   CSN: [id number] Arrival date & time: 06/26/18  6812     History   Chief Complaint Chief Complaint  Patient presents with  . Rectal Bleeding  . Diarrhea    HPI Jennifer Davila is a 62 y.o. female.  Level 5 caveat for urgency of condition.  Patient reports several episodes of rectal bleeding since yesterday.  Patient showed me a picture from her cell phone and there was a large amount of blood in the toilet water with some clots.  She was admitted for similar symptoms in June 2019.  Colonoscopy by Dr. Havery Moros ([facility]) revealed diverticulosis, but no diverticulitis or active bleeding.  Her hemoglobin has dropped from 11.1-10.2 in the past 24 hours.     Past Medical History:  Diagnosis Date  . Arthritis   . Constipation    on stool softener  . GERD (gastroesophageal reflux disease)   . Hyperlipidemia   . Hypertension   . Trouble swallowing 2013   following lap band    Patient Active Problem List   Diagnosis Date Noted  . Diverticulosis of colon with hemorrhage   . Leg pain 01/06/2018  . Bilateral lower extremity edema 01/06/2018  . Lower GI bleed 01/06/2018  . Hypokalemia 01/06/2018  . Constipation 01/06/2018  . Leg cramping 10/18/2017  . Vitamin D deficiency, unspecified 10/18/2017  . GERD (gastroesophageal reflux disease) 10/18/2017  . Hypertension, essential, benign 09/04/2017  . Hyperlipidemia 09/04/2017  . Morbid obesity with BMI of 40.0-44.9, adult ([place]) 09/04/2017  . Vertigo 09/04/2017  . Hip pain 05/25/2015    Past Surgical History:  Procedure Laterality Date  . ABDOMINAL HYSTERECTOMY    . CHOLECYSTECTOMY    . COLONOSCOPY N/A 01/08/2018   Procedure: COLONOSCOPY;  Surgeon: Yetta Flock, MD;  Location: [facility];  Service: Gastroenterology;  Laterality: N/A;  . LAPAROSCOPIC GASTRIC BANDING    . LAPAROSCOPIC REPAIR AND REMOVAL OF GASTRIC BAND  2013-2014     OB  History   None      Home Medications    Prior to Admission medications   Medication Sig Start Date End Date Taking? Authorizing Provider  acetaminophen (TYLENOL) 500 MG tablet Take 1,000 mg by mouth daily as needed for moderate pain (pain).    Yes [provider]  esomeprazole (NEXIUM) 40 MG capsule Take 1 capsule (40 mg total) by mouth daily. 10/18/17  Yes Martinique, [name], MD  hydrochlorothiazide (HYDRODIURIL) 25 MG tablet Take 25 mg by mouth daily.  04/19/18  Yes [provider]  losartan (COZAAR) 50 MG tablet TAKE 1 TABLET BY MOUTH EVERY DAY 03/24/18  Yes Martinique, [name], MD  polycarbophil (FIBERCON) 625 MG tablet Take 1 tablet (625 mg total) by mouth 2 (two) times daily. 01/20/18  Yes Debbe Odea, MD  polyethylene glycol (MIRALAX / GLYCOLAX) packet Take 17 g by mouth daily as needed for mild constipation. 01/14/18  Yes Debbe Odea, MD  pravastatin (PRAVACHOL) 20 MG tablet TAKE 1 TABLET BY MOUTH EVERY DAY 01/22/18  Yes Martinique, [name], MD  acetaminophen-codeine (TYLENOL #3) 300-30 MG tablet Take 1 tablet by mouth every 4 (four) hours as needed for moderate pain. Patient not taking: Reported on 06/26/2018 01/20/18   Martinique, [name], MD  Amino Acids-Protein Hydrolys (FEEDING SUPPLEMENT, PRO-STAT SUGAR FREE 64,) LIQD Take 30 mLs by mouth 2 (two) times daily. Patient not taking: Reported on 06/26/2018 01/14/18   Debbe Odea, MD  losartan (COZAAR) 25 MG tablet  Take 2 tablets by mouth daily Patient not taking: Reported on 06/26/2018 06/23/18   Martinique, [name], MD  meloxicam (MOBIC) 15 MG tablet Take 1 tablet (15 mg total) by mouth daily as needed for pain. Patient not taking: Reported on 06/26/2018 04/23/18   Martinique, [name], MD  tiZANidine (ZANAFLEX) 4 MG tablet Take 0.5-1 tablets (2-4 mg total) by mouth at bedtime. Patient not taking: Reported on 06/26/2018 10/18/17   Martinique, [name], MD    Family History Family History  Problem Relation Age of Onset  . Hypertension  Mother   . Stroke Mother   . Diabetes Father   . Alcohol abuse Father   . Kidney disease Father   . Colon polyps Brother   . Cancer Brother   . Alcohol abuse Brother     Social History Social History   Tobacco Use  . Smoking status: Former Smoker    Types: Cigarettes    Last attempt to quit: 10/02/1972    Years since quitting: 45.7  . Smokeless tobacco: Never Used  Substance Use Topics  . Alcohol use: No  . Drug use: No     Allergies   Patient has no known allergies.   Review of Systems Review of Systems  Unable to perform ROS: Acuity of condition     Physical Exam Updated Vital Signs BP 134/85   Pulse 77   Temp 97.9 F (36.6 C) (Oral)   Resp 15   SpO2 99%   Physical Exam  Constitutional: She is oriented to person, place, and time. She appears well-developed and well-nourished.  HENT:  Head: Normocephalic and atraumatic.  Eyes: Conjunctivae are normal.  Neck: Neck supple.  Cardiovascular: Normal rate and regular rhythm.  Pulmonary/Chest: Effort normal and breath sounds normal.  Abdominal: Soft. Bowel sounds are normal.  Genitourinary:  Genitourinary Comments: Rectal exam: No masses, red blood at tip of glove.  Musculoskeletal: Normal range of motion.  Neurological: She is alert and oriented to person, place, and time.  Skin: Skin is warm and dry.  Psychiatric: She has a normal mood and affect. Her behavior is normal.  Nursing note and vitals reviewed.    ED Treatments / Results  Labs (all labs ordered are listed, but only abnormal results are displayed) Labs Reviewed  COMPREHENSIVE METABOLIC PANEL - Abnormal; Notable for the following components:      Result Value   Potassium 3.1 (*)    BUN 24 (*)    Creatinine, Ser 1.02 (*)    Calcium 8.6 (*)    GFR calc non Af Amer 58 (*)    All other components within normal limits  CBC - Abnormal; Notable for the following components:   Hemoglobin 10.2 (*)    HCT 34.0 (*)    MCV 76.6 (*)    MCH 23.0 (*)     All other components within normal limits  POC OCCULT BLOOD, ED - Abnormal; Notable for the following components:   Fecal Occult Bld POSITIVE (*)    All other components within normal limits  TYPE AND SCREEN    EKG None  Radiology No results found.  Procedures Procedures (including critical care time)  Medications Ordered in ED Medications  sodium chloride 0.9 % bolus 500 mL (0 mLs Intravenous Stopped 06/26/18 1307)  morphine 4 MG/ML injection 4 mg (4 mg Intravenous Given 06/26/18 1314)     Initial Impression / Assessment and Plan / ED Course  I have reviewed the triage vital signs and the  nursing notes.  Pertinent labs & imaging results that were available during my care of the patient were reviewed by me and considered in my medical decision making (see chart for details).     Patient presents with hematochezia.  Her vital signs are stable.  Hemoglobin is dropped 1 g and 24 hours.  Discussed with gastroenterologist on-call for [place].  He recommended a tagged red blood cell study and possible IR intervention.  Will admit to hospitalist.  Final Clinical Impressions(s) / ED Diagnoses   Final diagnoses:  Hematochezia    ED Discharge Orders    None       Nat Christen, MD 06/26/18 1404

## 2018-06-26 NOTE — ED Notes (Signed)
Report given to Maggie, RN

## 2018-06-26 NOTE — H&P (Signed)
History and Physical  Jennifer Davila:[id number] DOB: [birth year] DOA: 06/26/2018    PCP: [place], [name], MD  Patient coming from:Home Chief Complaint: "bleeding from the rectum"  HPI: Jennifer Davila is a 62 y.o. female with medical history significant for hypertension, hyperlipidemia, GERD, history of diverticulosis who presents on 06/26/2018 with 3 days of bright red blood per rectum.   It started this past Tuesday, she had felt very nauseous while she was using the bathroom she noticed bright red blood mixed in with her stool, denies any black stool. She could also see it when she wiped herself. She has noticed some "aching" in her upper and lower quadrants of her abdomen. She states she had something similar back in June. She denies any NSAID use since her last hospitalization in June of this year. Denies fevers, chest pain, dysuria, hematuria, constipation, diarrhea.  Reports chills, some dyspnea, abdominal pain,  She was admitted in [place], 6/10-6/18/19, for similar presentation and was found to have clots and blood on colonoscopy but no active bleed. She required blood transfusion and was monitored and instructed to stop taking aspirin.    She has a history of acid reflux for which she takes nexium    ED Course: In the ED she was afebrile, hemodynamically stable, blood pressure 165/92-117/68.  Hemoccult was positive, patient was typed and screened, CMP notable for potassium of 3.1, BUN of 24, creatinine of 1.02. CBC showed hemoglobin of 10.2 (baseline 10.8-11.1).  Patient was given IV morphine and IV fluids.  ED physician spoke with GI consultant. [facility] was called for further management  Review of Systems:As mentioned in the history of present illness.Review of systems are otherwise negative Patient seen in the ED .   Past Medical History:  Diagnosis Date  . Arthritis   . Constipation    on stool softener  . GERD (gastroesophageal reflux  disease)   . Hyperlipidemia   . Hypertension   . Trouble swallowing 2013   following lap band   Past Surgical History:  Procedure Laterality Date  . ABDOMINAL HYSTERECTOMY    . CHOLECYSTECTOMY    . COLONOSCOPY N/A 01/08/2018   Procedure: COLONOSCOPY;  Surgeon: Yetta Flock, MD;  Location: [facility];  Service: Gastroenterology;  Laterality: N/A;  . LAPAROSCOPIC GASTRIC BANDING    . LAPAROSCOPIC REPAIR AND REMOVAL OF GASTRIC BAND  2013-2014   No Known Allergies Social History:  reports that she quit smoking about 45 years ago. Her smoking use included cigarettes. She has never used smokeless tobacco. She reports that she does not drink alcohol or use drugs. Family History  Problem Relation Age of Onset  . Hypertension Mother   . Stroke Mother   . Diabetes Father   . Alcohol abuse Father   . Kidney disease Father   . Colon polyps Brother   . Cancer Brother   . Alcohol abuse Brother       Prior to Admission medications   Medication Sig Start Date End Date Taking? Authorizing Provider  acetaminophen (TYLENOL) 500 MG tablet Take 1,000 mg by mouth daily as needed for moderate pain (pain).    Yes [provider]  esomeprazole (NEXIUM) 40 MG capsule Take 1 capsule (40 mg total) by mouth daily. 10/18/17  Yes Martinique, [name], MD  hydrochlorothiazide (HYDRODIURIL) 25 MG tablet Take 25 mg by mouth daily.  04/19/18  Yes [provider]  losartan (COZAAR) 50 MG tablet TAKE 1 TABLET BY MOUTH EVERY DAY 03/24/18  Yes Martinique, [name], MD  polycarbophil (FIBERCON) 625 MG tablet Take 1 tablet (625 mg total) by mouth 2 (two) times daily. 01/20/18  Yes Debbe Odea, MD  polyethylene glycol (MIRALAX / GLYCOLAX) packet Take 17 g by mouth daily as needed for mild constipation. 01/14/18  Yes Debbe Odea, MD  pravastatin (PRAVACHOL) 20 MG tablet TAKE 1 TABLET BY MOUTH EVERY DAY 01/22/18  Yes Martinique, [name], MD  acetaminophen-codeine (TYLENOL #3) 300-30 MG tablet Take 1 tablet by  mouth every 4 (four) hours as needed for moderate pain. Patient not taking: Reported on 06/26/2018 01/20/18   [place], [name], MD  Amino Acids-Protein Hydrolys (FEEDING SUPPLEMENT, PRO-STAT SUGAR FREE 64,) LIQD Take 30 mLs by mouth 2 (two) times daily. Patient not taking: Reported on 06/26/2018 01/14/18   Debbe Odea, MD  losartan (COZAAR) 25 MG tablet Take 2 tablets by mouth daily Patient not taking: Reported on 06/26/2018 06/23/18   [place], [name], MD  meloxicam (MOBIC) 15 MG tablet Take 1 tablet (15 mg total) by mouth daily as needed for pain. Patient not taking: Reported on 06/26/2018 04/23/18   [place], [name], MD  tiZANidine (ZANAFLEX) 4 MG tablet Take 0.5-1 tablets (2-4 mg total) by mouth at bedtime. Patient not taking: Reported on 06/26/2018 10/18/17   [place], [name], MD    Physical Exam: BP 117/68   Pulse 69   Temp 97.9 F (36.6 C) (Oral)   Resp 13   SpO2 96%   Constitutional obese female, no distress Eyes: EOMI, anicteric, pale conjunctiva ENMT: Dry oral mucosa, normal dentition Neck: FROM,  Cardiovascular: RRR no MRGs, with no peripheral edema Respiratory: Normal respiratory effort on room air, clear breath sounds  Abdomen: Soft, generalized tenderness, no rebound tenderness or guarding Skin: No rash ulcers, or lesions. Without skin tenting  Neurologic: Grossly no focal neuro deficit. Psychiatric:Appropriate affect, and mood. Mental status AAOx3          Labs on Admission:  Basic Metabolic Panel: Recent Labs  Lab 06/26/18 0916  NA 140  K 3.1*  CL 104  CO2 29  GLUCOSE 98  BUN 24*  CREATININE 1.02*  CALCIUM 8.6*   Liver Function Tests: Recent Labs  Lab 06/26/18 0916  AST 17  ALT 14  ALKPHOS 74  BILITOT 0.7  PROT 7.3  ALBUMIN 4.1   No results for input(s): LIPASE, AMYLASE in the last 168 hours. No results for input(s): AMMONIA in the last 168 hours. CBC: Recent Labs  Lab 06/25/18 0820 06/26/18 0916  WBC 6.5 7.1  HGB 11.1* 10.2*  HCT 35.6*  34.0*  MCV 72.9* 76.6*  PLT 275.0 265   Cardiac Enzymes: No results for input(s): CKTOTAL, CKMB, CKMBINDEX, TROPONINI in the last 168 hours.  BNP (last 3 results) No results for input(s): BNP in the last 8760 hours.  ProBNP (last 3 results) No results for input(s): PROBNP in the last 8760 hours.  CBG: No results for input(s): GLUCAP in the last 168 hours.  Radiological Exams on Admission: No results found.   Assessment/Plan Present on Admission: . Hematochezia . GERD (gastroesophageal reflux disease) . Hyperlipidemia . Hypertension, essential, benign . Hypokalemia  Active Problems:   Hypertension, essential, benign   Hyperlipidemia   Morbid obesity with BMI of 40.0-44.9, adult (HCC)   GERD (gastroesophageal reflux disease)   Hypokalemia   Hematochezia   Hematochezia Previous history of suspected diverticular bleed, status post colonoscopy 12/2016 Could be another diverticular bleed Has had no recent NSAID use No melena  doubt upper GI May need RBC tagged if hemoglobin continues to drop per GI Previous colonoscopy mentioned internal hemorrhoids - Appreciate GI consultation and recommendations - Timed IV fluids -Trend CBC every 8 hours -Type and screen, transfuse for goal hemoglobin greater than 7  Acute on chronic anemia Baseline hemoglobin 10.8-11 10.2 on initial lab work Suspect acute worsening related to acute blood loss -Trend CBC  -Already typed and screened -SCDs  AKI, likely prerenal etiology Baseline creatinine 0.6-0.8 Slightly elevated at 1.07 Elevated BUN of 23 monitor related to acute blood loss anemia Expect improvement with IV fluids as this is most likely prerenal related to dehydration/acute blood loss -Timed IV fluids -Serial BMP  Hypokalemia - Holding home HCTZ - Replete orally -Serial BMP  Hypertension SBP range 110-160 In setting of active bleed we will hold off on resuming home BP meds -Hold home losartan and HCTZ - PRN IV  hydralazine for SBP greater than 180, DBP greater than 100  Hyperlipidemia - Continue home statin    DVT prophylaxis: SCDs  Code Status: Full Code   Family Communication: Family at bedside   Consults called: GI(EDP called)   Admission status: Admitted as observation to med-surge unit.      Desiree Hane MD [facility]  Pager [phone]  If 7PM-7AM, please contact night-coverage [email or web address] Password [facility]  06/26/2018, 3:31 PM

## 2018-06-26 NOTE — ED Notes (Signed)
ED TO INPATIENT HANDOFF REPORT  Name/Age/Gender Jennifer Davila 62 y.o. female  Code Status Code Status History    Date Active Date Inactive Code Status Order ID Comments User Context   01/06/2018 1550 01/14/2018 1413 Full Code [id number]  Shelly Coss, MD ED      Home/SNF/Other Home  Chief Complaint rectal bleeding  Level of Care/Admitting Diagnosis ED Disposition    ED Disposition Condition Comment   Admit  Hospital Area: [facility] [[id number]]  Level of Care: Med-Surg [16]  Diagnosis: Hematochezia [409735]  Admitting Physician: Desiree Hane [[id number]]  Attending Physician: Desiree Hane [phone]  PT Class (Do Not Modify): Observation [104]  PT Acc Code (Do Not Modify): Observation [10022]       Medical History Past Medical History:  Diagnosis Date  . Arthritis   . Constipation    on stool softener  . GERD (gastroesophageal reflux disease)   . Hyperlipidemia   . Hypertension   . Trouble swallowing 2013   following lap band    Allergies No Known Allergies  IV Location/Drains/Wounds Patient Lines/Drains/Airways Status   Active Line/Drains/Airways    Name:   Placement date:   Placement time:   Site:   Days:   Peripheral IV 06/26/18 Left Antecubital   06/26/18    0911    Antecubital   less than 1          Labs/Imaging Results for orders placed or performed during the hospital encounter of 06/26/18 (from the past 48 hour(s))  Comprehensive metabolic panel     Status: Abnormal   Collection Time: 06/26/18  9:16 AM  Result Value Ref Range   Sodium 140 135 - 145 mmol/L   Potassium 3.1 (L) 3.5 - 5.1 mmol/L   Chloride 104 98 - 111 mmol/L   CO2 29 22 - 32 mmol/L   Glucose, Bld 98 70 - 99 mg/dL   BUN 24 (H) 8 - 23 mg/dL   Creatinine, Ser 1.02 (H) 0.44 - 1.00 mg/dL   Calcium 8.6 (L) 8.9 - 10.3 mg/dL   Total Protein 7.3 6.5 - 8.1 g/dL   Albumin 4.1 3.5 - 5.0 g/dL   AST 17 15 - 41 U/L   ALT 14 0 - 44 U/L   Alkaline  Phosphatase 74 38 - 126 U/L   Total Bilirubin 0.7 0.3 - 1.2 mg/dL   GFR calc non Af Amer 58 (L) >60 mL/min   GFR calc Af Amer >60 >60 mL/min   Anion gap 7 5 - 15    Comment: Performed at [facility], [place] [address], [place], [place] [address]  CBC     Status: Abnormal   Collection Time: 06/26/18  9:16 AM  Result Value Ref Range   WBC 7.1 4.0 - 10.5 K/uL   RBC 4.44 3.87 - 5.11 MIL/uL   Hemoglobin 10.2 (L) 12.0 - 15.0 g/dL   HCT 34.0 (L) 36.0 - 46.0 %   MCV 76.6 (L) 80.0 - 100.0 fL   MCH 23.0 (L) 26.0 - 34.0 pg   MCHC 30.0 30.0 - 36.0 g/dL   RDW 15.0 11.5 - 15.5 %   Platelets 265 150 - 400 K/uL   nRBC 0.0 0.0 - 0.2 %    Comment: Performed at [facility], [place] [address] [place], [place] [address]  Type and screen [place]     Status: None   Collection Time: 06/26/18  9:20 AM  Result Value Ref  Range   ABO/RH(D) O POS    Antibody Screen NEG    Sample Expiration      06/29/2018 Performed at [facility], [place] [address] [place], [place] [address]   POC occult blood, ED     Status: Abnormal   Collection Time: 06/26/18 10:16 AM  Result Value Ref Range   Fecal Occult Bld POSITIVE (A) NEGATIVE   No results found. None  Pending Labs [employer] (From admission, onward)    Start     Ordered   Signed and Occupational hygienist morning,   R     Signed and Held   Signed and Held  CBC  Now then every 8 hours,   R     Signed and Held          Vitals/Pain Today's Vitals   06/26/18 1100 06/26/18 1200 06/26/18 1300 06/26/18 1500  BP: (!) 145/79 139/78 134/85 117/68  Pulse: 65 60 77 69  Resp: 10 14 15 13   Temp:      TempSrc:      SpO2: 100% 100% 99% 96%    Isolation Precautions No active isolations  Medications Medications  sodium chloride 0.9 % bolus 500 mL (0 mLs Intravenous Stopped 06/26/18 1307)  morphine 4 MG/ML injection 4 mg (4 mg Intravenous Given 06/26/18 1314)     Mobility walks with person assist

## 2018-06-26 NOTE — ED Triage Notes (Addendum)
Patient arrived via [facility] from home. Patient c/o diarrhea that has turned a reddish Almario in the past 24 hours. Same thing happened in June but went away. Patient saw primary care provider yesterday for symptoms (Read Note).   Denies hemorrhoids or hx of gi bleeds per EMS. A/Ox4 with ems. Here at ED patient asked EMS what her name was.  Patient A/Ox4 with this RN. Ambulatory with assistance down steps at home. Lives with her husband. Per EMS.

## 2018-06-26 NOTE — ED Notes (Signed)
Bed: WA07 Expected date:  Expected time:  Means of arrival:  Comments: EMS/rectal pain/diarrhea

## 2018-06-26 NOTE — ED Notes (Signed)
Transport has been called.  

## 2018-06-26 NOTE — Progress Notes (Signed)
Called in consultation for this patient. The patient will be placed on the list to be evaluated later today versus tomorrow. Patient with history of diverticulosis (pandiverticulosis). Presenting per ED with hematochezia with overt bleeding earlier today as well as yesterday. Hemogram has dropped from yesterday. Creatinine slightly elevated though last creatinine was 5 months ago. Per ED is felt to potentially be more hematochezia rather than diarrhea. If the patient is presenting with diarrheal symptoms she should have stool cultures sent as well. However if the patient has more significant amounts of hematochezia and remains hemodynamically stable she should have a tagged red blood cell nuclear medicine scan to try and evaluate the source or area of likely diverticular hemorrhage. A CT abdomen/pelvis with contrast angiography may be considered as well to try and evaluate and localize the region if actively bleeding. If she becomes hemodynamically unstable she should be moved to the intensive care unit and GI should be recalled to consider whether an upper endoscopy is necessary to rule out a quick upper GI bleed however with her history and 2 colonoscopies in the last year and a half showing evidence of pandiverticulosis I suspect the etiology of her bleeding is likely diverticular once again.  Please feel free to reach out to myself if necessary over the course of today and tomorrow. Plan for evaluation formally later today or tomorrow.   Justice Britain, MD [facility] Office # [phone]

## 2018-06-26 NOTE — Consult Note (Addendum)
Gastroenterology Inpatient Consultation   Attending Requesting Consult Desiree Hane, [place] [facility] Day: 1  Reason for Consult Hematochezia and anemia   History of Present Illness  Jennifer Davila is a 62 y.o. female with a pmh significant for OA, GERD, Constipation, HTN, HLD, Diverticulosis (c/b prior Diverticular hemorrhage).  The GI service is consulted for evaluation and management of hematochezia and anemia.  This is a patient who has previously been seen by our group in the setting of bright red blood per rectum/hematochezia earlier this summer.  She has had 2 colonoscopies that have shown evidence of pandiverticulosis although majority of tics are right-sided and left-sided in regards to the ascending colon and sigmoid and descending colon regions.  She had a negative tagged red blood cell scan in June of this year.  Because of persistent concern for bleeding she underwent a colonoscopy and no active source of bleeding was found.  She was stabilized without any further evidence of bleeding and was able to be discharged.  Yesterday she had an episode of bright red blood per rectum she saw her primary care doctor.  She had a recurrent episode today and came in for further evaluation she began to develop cramping/discomfort in her abdomen.  She is not taking any significant nonsteroidals per her report.  She only takes Tylenol.  She has heartburn symptoms at times but it is well controlled on Nexium once daily.  She does not feel that her constipation is any different than normal for her.  She came in for further evaluation.  Her hemogram has dropped from yesterday to today.  She has evidence of a slight elevation in her creatinine from 5.  She denies a history of a prior endoscopy.  GI Review of Systems Positive as above including decreased appetite for the last 24 hours Negative for dysphagia, odynophagia, melena   Problem List   Patient Active Problem List   Diagnosis Date Noted  . Hematochezia 06/26/2018  . Diverticulosis of colon with hemorrhage   . Leg pain 01/06/2018  . Bilateral lower extremity edema 01/06/2018  . Lower GI bleed 01/06/2018  . Hypokalemia 01/06/2018  . Constipation 01/06/2018  . Leg cramping 10/18/2017  . Vitamin D deficiency, unspecified 10/18/2017  . GERD (gastroesophageal reflux disease) 10/18/2017  . Hypertension, essential, benign 09/04/2017  . Hyperlipidemia 09/04/2017  . Morbid obesity with BMI of 40.0-44.9, adult ([place]) 09/04/2017  . Vertigo 09/04/2017  . Hip pain 05/25/2015     Histories  Past Medical History Past Medical History:  Diagnosis Date  . Arthritis   . Constipation    on stool softener  . GERD (gastroesophageal reflux disease)   . Hyperlipidemia   . Hypertension   . Trouble swallowing 2013   following lap band   Past Surgical History:  Procedure Laterality Date  . ABDOMINAL HYSTERECTOMY    . CHOLECYSTECTOMY    . COLONOSCOPY N/A 01/08/2018   Procedure: COLONOSCOPY;  Surgeon: Yetta Flock, MD;  Location: [facility];  Service: Gastroenterology;  Laterality: N/A;  . LAPAROSCOPIC GASTRIC BANDING    . LAPAROSCOPIC REPAIR AND REMOVAL OF GASTRIC BAND  2013-2014    Allergies No Known Allergies  Family History Family History  Problem Relation Age of Onset  . Hypertension Mother   . Stroke Mother   . Diabetes Father   . Alcohol abuse Father   . Kidney disease Father   . Colon polyps Brother   . Cancer Brother   . Alcohol abuse  Brother    The patient's FH is negative for IBD/IBS/Liver Disease/GI Malignancies.  Social History Social History   Socioeconomic History  . Marital status: Married    Spouse name: Not on file  . Number of children: 1  . Years of education: Not on file  . Highest education level: Not on file  Occupational History  . Not on file  Social Needs  . Financial resource strain: Not on file  . Food insecurity:    Worry: Not on file    Inability:  Not on file  . Transportation needs:    Medical: Not on file    Non-medical: Not on file  Tobacco Use  . Smoking status: Former Smoker    Types: Cigarettes    Last attempt to quit: 10/02/1972    Years since quitting: 45.7  . Smokeless tobacco: Never Used  Substance and Sexual Activity  . Alcohol use: No  . Drug use: No  . Sexual activity: Never  Lifestyle  . Physical activity:    Days per week: Not on file    Minutes per session: Not on file  . Stress: Not on file  Relationships  . Social connections:    Talks on phone: Not on file    Gets together: Not on file    Attends religious service: Not on file    Active member of club or organization: Not on file    Attends meetings of clubs or organizations: Not on file    Relationship status: Not on file  . Intimate partner violence:    Fear of current or ex partner: Not on file    Emotionally abused: Not on file    Physically abused: Not on file    Forced sexual activity: Not on file  Other Topics Concern  . Not on file  Social History Narrative  . Not on file    Medications  Home Medications No current facility-administered medications on file prior to encounter.    Current Outpatient Medications on File Prior to Encounter  Medication Sig Dispense Refill  . acetaminophen (TYLENOL) 500 MG tablet Take 1,000 mg by mouth daily as needed for moderate pain (pain).     Marland Kitchen esomeprazole (NEXIUM) 40 MG capsule Take 1 capsule (40 mg total) by mouth daily. 90 capsule 3  . hydrochlorothiazide (HYDRODIURIL) 25 MG tablet Take 25 mg by mouth daily.   0  . losartan (COZAAR) 50 MG tablet TAKE 1 TABLET BY MOUTH EVERY DAY 90 tablet 1  . polycarbophil (FIBERCON) 625 MG tablet Take 1 tablet (625 mg total) by mouth 2 (two) times daily. 60 tablet 0  . polyethylene glycol (MIRALAX / GLYCOLAX) packet Take 17 g by mouth daily as needed for mild constipation. 14 each 0  . pravastatin (PRAVACHOL) 20 MG tablet TAKE 1 TABLET BY MOUTH EVERY DAY 90 tablet  1  . acetaminophen-codeine (TYLENOL #3) 300-30 MG tablet Take 1 tablet by mouth every 4 (four) hours as needed for moderate pain. (Patient not taking: Reported on 06/26/2018) 20 tablet 0  . Amino Acids-Protein Hydrolys (FEEDING SUPPLEMENT, PRO-STAT SUGAR FREE 64,) LIQD Take 30 mLs by mouth 2 (two) times daily. (Patient not taking: Reported on 06/26/2018) 900 mL 0  . losartan (COZAAR) 25 MG tablet Take 2 tablets by mouth daily (Patient not taking: Reported on 06/26/2018) 180 tablet 1  . meloxicam (MOBIC) 15 MG tablet Take 1 tablet (15 mg total) by mouth daily as needed for pain. (Patient not taking: Reported on 06/26/2018) 30  tablet 1  . tiZANidine (ZANAFLEX) 4 MG tablet Take 0.5-1 tablets (2-4 mg total) by mouth at bedtime. (Patient not taking: Reported on 06/26/2018) 30 tablet 1   Scheduled Inpatient Medications . pantoprazole  40 mg Oral Daily  . potassium chloride  40 mEq Oral BID  . pravastatin  20 mg Oral q1800  . sodium chloride flush  3 mL Intravenous Q12H   Continuous Inpatient Infusions . sodium chloride 75 mL/hr at 06/26/18 1630   PRN Inpatient Medications acetaminophen, ondansetron **OR** ondansetron (ZOFRAN) IV, polyethylene glycol   Review of Systems  General: Denies fevers/chills/weight loss HEENT: Denies oral lesions Cardiovascular: Denies chest pain Pulmonary: Denies shortness of breath Gastroenterological: See HPI Genitourinary: Denies darkened urine Hematological: Denies easy bruising/bleeding Dermatological: Denies jaundice or new skin rashes Psychological: Mood is scared and anxious though she believes in the "Lord" and that he will help guide her during her hospital stay Musculoskeletal: Denies new arthralgias   Physical Examination  BP 128/76 (BP Location: Right Arm)   Pulse 77   Temp 98 F (36.7 C) (Oral)   Resp 15   Ht 5\' 2"  (1.575 m)   Wt 109 kg   SpO2 100%   BMI 43.94 kg/m  GEN: Fatigued, in visible discomfort, appears stated age  PSYCH:  Cooperative, without pressured speech EYE: Conjunctivae pink, sclerae anicteric ENT: Dry mucous membranes without oral ulcers  NECK: Supple, enlarged neck girth CV: RR without R/Gs  RESP: Decreased breath sounds at the bases bilaterally GI: Hyperactive bowel sounds present, soft, minimal tenderness to deep palpation in the midepigastrium right lower quadrant and left lower quadrant regions, without rebound, no volitional guarding appreciated, unable to appreciate hepatosplenomegaly due to body habitus GU: DRE not performed MSK/EXT: Bilateral lower extremity edema present SKIN: No jaundice NEURO:  Alert & Oriented x 3, no focal deficits   Review of Data  I reviewed the following data at the time of this encounter:  Laboratory Studies   Recent Labs  Lab 06/26/18 0916  NA 140  K 3.1*  CL 104  CO2 29  BUN 24*  CREATININE 1.02*  GLUCOSE 98  CALCIUM 8.6*   Recent Labs  Lab 06/26/18 0916  AST 17  ALT 14  ALKPHOS 74    Recent Labs  Lab 06/25/18 0820 06/26/18 0916 06/26/18 1639  WBC 6.5 7.1 10.1  HGB 11.1* 10.2* 9.2*  HCT 35.6* 34.0* 31.1*  PLT 275.0 265 265   No results for input(s): APTT, INR in the last 168 hours.  Imaging Studies  June 2019 CT abdomen pelvis with contrast IMPRESSION: Changes consistent with fecal impaction in the rectum. Chronic changes as described above.  June 2019 nuclear medicine GI blood loss scan IMPRESSION: No active GI bleeding during the 2 hours of observation.  GI Procedures and Studies  June 2019 colonoscopy - Preparation of the colon was fair. - The examined portion of the ileum was normal. - Blood / clots in the entire examined colon. - Diverticulosis in the entire examined colon. - Internal hemorrhoids. - Time spent lavaging the colon - no active bleeding appreciated.  March 2018 colonoscopy - One 3 mm polyp in the cecum, removed with a cold snare. Resected and retrieved. - One 4 mm polyp at the hepatic flexure, removed  with a cold snare. Resected and retrieved. - Diverticulosis in the entire examined colon. - Internal hemorrhoids. - Anal papilla(e) were hypertrophied. - The examination was otherwise normal.   Assessment  Ms. Gonia is a 62 y.o. female  with a pmh significant for OA, GERD, Constipation, HTN, HLD, Diverticulosis (c/b prior Diverticular hemorrhage).  The GI service is consulted for evaluation and management of hematochezia and anemia.  This is a patient who presents with recurrent hematochezia in the setting of known diverticulosis and prior presumed diverticular hemorrhage from earlier this year.  Her situation is difficult because she has pandiverticulosis.  She does not describe a history that would be significant for an infectious etiology such as colitis however she describes significant discomfort and pressure which she associates is likely cramping when she is trying to pass a bowel movement.  However yesterday when the bleeding began it was completely painless.  She is not had any overt presyncope or overt syncope or lightheadedness before she came to the hospital.  Within the confines of her hemodynamic stability at this point in time the most likely etiology of her bleeding is diverticular.  We discussed that as she had previously had work-up in the last year and a half of 2 full colonoscopies not clear that a repeat colonoscopy has been to be helpful for the patient that at present.  I recommend that if she has another significant episode of bleeding that she undergo a tagged red blood cell scan nuclear study versus a CT abdomen/pelvis with contrast as an angiography or occult bleeding scan to evaluate for potential source of bleeding within the presumed lower GI tract.  If the patient develops hemodynamic changes she should be transferred to the intensive care unit to be monitored closely.  If she develop hemodynamic instability and had profuse bowel movements of hematochezia that she could not be  supported with blood products then we would have to consider the role of a possible upper endoscopy to ensure that she did not have source of rapid GI bleeding from a gastric ulcer or duodenal ulcer.  I think this is much less likely based on her 2 very similar presentations.  She should be increased to PPI twice daily.  We will watch her closely.  Please alert GI if things change dramatically.   Plan/Recommendations  Continue to trend/monitor at least every 12 hours if not every 6-8 hours PPI twice daily (will need to be increased) If persistent or recurrent bleeding then a tagged RBC scan or a CT occult or CT angiography of the abdomen/pelvis to be performed Maintain hemoglobin greater than 7-8 Clear liquid diet okay for patient today Only if the patient had persistent bleeding would we query a repeat endoscopic intervention or as noted above for unless hemodynamic instability were to occur   Thank you for this consult.  We will continue to follow.  Please page/call with questions or concerns.   Justice Britain, MD [facility]oscopy Office # [phone]

## 2018-06-27 ENCOUNTER — Other Ambulatory Visit: Payer: Self-pay

## 2018-06-27 DIAGNOSIS — D62 Acute posthemorrhagic anemia: Secondary | ICD-10-CM

## 2018-06-27 DIAGNOSIS — I10 Essential (primary) hypertension: Secondary | ICD-10-CM | POA: Diagnosis not present

## 2018-06-27 DIAGNOSIS — E876 Hypokalemia: Secondary | ICD-10-CM | POA: Diagnosis not present

## 2018-06-27 DIAGNOSIS — K5731 Diverticulosis of large intestine without perforation or abscess with bleeding: Secondary | ICD-10-CM | POA: Diagnosis not present

## 2018-06-27 DIAGNOSIS — K921 Melena: Secondary | ICD-10-CM | POA: Diagnosis not present

## 2018-06-27 LAB — CBC
HCT: 26.4 % — ABNORMAL LOW (ref 36.0–46.0)
HCT: 28.2 % — ABNORMAL LOW (ref 36.0–46.0)
Hemoglobin: 7.9 g/dL — ABNORMAL LOW (ref 12.0–15.0)
Hemoglobin: 8.4 g/dL — ABNORMAL LOW (ref 12.0–15.0)
MCH: 22.5 pg — ABNORMAL LOW (ref 26.0–34.0)
MCH: 23.2 pg — ABNORMAL LOW (ref 26.0–34.0)
MCHC: 29.8 g/dL — ABNORMAL LOW (ref 30.0–36.0)
MCHC: 29.9 g/dL — ABNORMAL LOW (ref 30.0–36.0)
MCV: 75.4 fL — ABNORMAL LOW (ref 80.0–100.0)
MCV: 77.4 fL — ABNORMAL LOW (ref 80.0–100.0)
Platelets: 223 10*3/uL (ref 150–400)
Platelets: 265 10*3/uL (ref 150–400)
RBC: 3.41 MIL/uL — ABNORMAL LOW (ref 3.87–5.11)
RBC: 3.74 MIL/uL — ABNORMAL LOW (ref 3.87–5.11)
RDW: 15 % (ref 11.5–15.5)
RDW: 15.2 % (ref 11.5–15.5)
WBC: 6.3 10*3/uL (ref 4.0–10.5)
WBC: 8.6 10*3/uL (ref 4.0–10.5)
nRBC: 0 % (ref 0.0–0.2)
nRBC: 0 % (ref 0.0–0.2)

## 2018-06-27 LAB — BASIC METABOLIC PANEL
Anion gap: 7 (ref 5–15)
BUN: 23 mg/dL (ref 8–23)
CO2: 24 mmol/L (ref 22–32)
Calcium: 8.3 mg/dL — ABNORMAL LOW (ref 8.9–10.3)
Chloride: 109 mmol/L (ref 98–111)
Creatinine, Ser: 0.91 mg/dL (ref 0.44–1.00)
GFR calc Af Amer: >60 mL/min (ref >60)
GFR calc non Af Amer: >60 mL/min (ref >60)
Glucose, Bld: 98 mg/dL (ref 70–99)
Potassium: 3.2 mmol/L — ABNORMAL LOW (ref 3.5–5.1)
Sodium: 140 mmol/L (ref 135–145)

## 2018-06-27 LAB — HEMOGLOBIN AND HEMATOCRIT, BLOOD
HCT: 26.1 % — ABNORMAL LOW (ref 36.0–46.0)
Hemoglobin: 7.9 g/dL — ABNORMAL LOW (ref 12.0–15.0)

## 2018-06-27 MED ORDER — POTASSIUM CHLORIDE CRYS ER 20 MEQ PO TBCR
40.0000 meq | EXTENDED_RELEASE_TABLET | Freq: Once | ORAL | Status: AC
  Administered 2018-06-27: 40 meq via ORAL
  Filled 2018-06-27: qty 2

## 2018-06-27 NOTE — Progress Notes (Addendum)
[facility] PROGRESS NOTE  Jennifer Davila SJG:[id number] DOB: [birth year] DOA: 06/26/2018  PCP: Martin[place], [name], MD  Brief History/Interval Summary: 62 y.o. female with medical history significant for hypertension, hyperlipidemia, GERD, history of diverticulosis who presents on 06/26/2018 with 3 days of bright red blood per rectum.  She had similar episodes earlier this year in June.  She underwent colonoscopy at that time which did not show any active bleeding.  Patient was hospitalized for further management.  Patient seen by gastroenterology.   Reason for Visit: Hematochezia  Consultants: Gastroenterology  Procedures: None yet  Antibiotics: None  Subjective/Interval History: Patient continues to have some discomfort in her abdomen but has not had any further episodes of bleeding since 7 PM last night.  Denies any nausea vomiting.  Complains of mild headache in the frontal area.  ROS: Denies any chest pain or shortness of breath  Objective:  Vital Signs  Vitals:   06/26/18 1659 06/26/18 1700 06/26/18 2130 06/27/18 0649  BP: 128/76  (!) 154/89 126/81  Pulse: 77  78 76  Resp: 15  18 17   Temp: 98 F (36.7 C)  97.6 F (36.4 C) 97.9 F (36.6 C)  TempSrc: Oral  Oral Oral  SpO2: 100%  99% 98%  Weight:  109 kg    Height:  5\' 2"  (1.575 m)      Intake/Output Summary (Last 24 hours) at 06/27/2018 1149 Last data filed at 06/26/2018 1700 Gross per 24 hour  Intake 537.5 ml  Output -  Net 537.5 ml   Filed Weights   06/26/18 1700  Weight: 109 kg    General appearance: alert, cooperative, appears stated age and no distress Head: Normocephalic, without obvious abnormality, atraumatic Resp: clear to auscultation bilaterally Cardio: regular rate and rhythm, S1, S2 normal, no murmur, click, rub or gallop GI: Abdomen is soft.  Mildly tender diffusely without any rebound rigidity or guarding.  Bowel sounds are present normal.  No masses  organomegaly Extremities: extremities normal, atraumatic, no cyanosis or edema Neurologic: No obvious focal neurological deficits noted.  Lab Results:  Data Reviewed: I have personally reviewed following labs and imaging studies  CBC: Recent Labs  Lab 06/25/18 0820 06/26/18 0916 06/26/18 1639 06/27/18 0011 06/27/18 0811  WBC 6.5 7.1 10.1 8.6 6.3  HGB 11.1* 10.2* 9.2* 8.4* 7.9*  HCT 35.6* 34.0* 31.1* 28.2* 26.4*  MCV 72.9* 76.6* 76.6* 75.4* 77.4*  PLT 275.0 265 265 265 654    Basic Metabolic Panel: Recent Labs  Lab 06/26/18 0916 06/27/18 0353  NA 140 140  K 3.1* 3.2*  CL 104 109  CO2 29 24  GLUCOSE 98 98  BUN 24* 23  CREATININE 1.02* 0.91  CALCIUM 8.6* 8.3*    GFR: Estimated Creatinine Clearance: 72.7 mL/min (by C-G formula based on SCr of 0.91 mg/dL).  Liver Function Tests: Recent Labs  Lab 06/26/18 0916  AST 17  ALT 14  ALKPHOS 74  BILITOT 0.7  PROT 7.3  ALBUMIN 4.1     Radiology Studies: No results found.   Medications:  Scheduled: . pantoprazole  40 mg Oral Daily  . pravastatin  20 mg Oral q1800  . sodium chloride flush  3 mL Intravenous Q12H   Continuous: . sodium chloride 75 mL/hr at 06/27/18 0944   YTK:[id number], ondansetron **OR** ondansetron (ZOFRAN) IV, polyethylene glycol    Assessment/Plan:   Hematochezia Most likely diverticular in origin.  She had a similar presentation in June and underwent colonoscopy at that time which did  not identify any obvious source of the bleeding however thought to be diverticular.    Gastroenterology is following.  Patient has not had any further episodes of bleeding since 7 PM last night.  She will need bleeding scan if she has recurrence of bleeding.  Hemoglobin noted to have dropped some this morning.  Acute blood loss anemia Secondary to hematochezia.  Drop in hemoglobin noted this morning.  Continue to monitor for now.  Transfuse if it is below 7.    Acute renal failure This was mild.   Renal function is normal this morning.  Hypokalemia This will be repleted.  Essential hypertension Monitor blood pressures closely.  Holding her antihypertensives for now.  Blood pressure is reasonably well controlled.  Hyperlipidemia Statin.  DVT Prophylaxis: SCDs    Code Status: Full code Family Communication: Discussed with the patient Disposition Plan: Management as outlined above.    LOS: 0 days   [facility] Pager [phone] 06/27/2018, 11:49 AM  If 7PM-7AM, please contact night-coverage at [email or web address], password [facility]

## 2018-06-27 NOTE — Progress Notes (Signed)
     [facility] Progress Note   Chief Complaint:   GI bleed   SUBJECTIVE:    feels okay this am. No significant abdominal pain, "just sore" No bleeding during the night or this am   ASSESSMENT AND PLAN:   1. 62 yo female with known pandiverticulosis admitted with hematochezia, presumably diverticular hemorrhage though those are usually painless and she describes associated diffuse abdomen pain. Ischemic colitis possible but seems less likely with normal WBC and the bleeding was not PRECEDED by the abdominal pain which is most often the case.   -she did have more BMs with blood yesterday evening, last one was around 7pm (she thinks). No bleeding through the night or this am but hgb declined some overnight 8.4 >>>7.9. -If she bleeds again, recommend tagged RBC scan.  -Will defer transfusion to admitting team. She endorses some mild dizziness  2. AKI, resolved.    OBJECTIVE:     Vital signs in last 24 hours: Temp:  [97.6 F (36.4 C)-98 F (36.7 C)] 97.9 F (36.6 C) (11/29 0649) Pulse Rate:  [60-81] 76 (11/29 0649) Resp:  [10-23] 17 (11/29 0649) BP: (117-166)/(68-99) 126/81 (11/29 0649) SpO2:  [96 %-100 %] 98 % (11/29 0649) Weight:  [109 kg] 109 kg (11/28 1700) Last BM Date: 06/25/18 General:   Alert, pleasant female in NAD EENT:  Normal hearing, non icteric sclera, conjunctive pink.  Heart:  Regular rate and rhythm..  No lower extremity edema   Pulm: Normal respiratory effort, lungs CTA bilaterally without wheezes or crackles. Abdomen:  Soft, nondistended, nontender.  Normal bowel sounds, no masses felt.       Neurologic:  Alert and  oriented x4;  grossly normal neurologically. Psych:  Pleasant, cooperative.  Normal mood and affect.   Intake/Output from previous day: 11/28 0701 - 11/29 0700 In: 537.5 [I.V.:37.5; IV Piggyback:500] Out: -  Intake/Output this shift: No intake/output data recorded.  Lab Results: Recent Labs    06/26/18 1639  06/27/18 0011 06/27/18 0811  WBC 10.1 8.6 6.3  HGB 9.2* 8.4* 7.9*  HCT 31.1* 28.2* 26.4*  PLT 265 265 223   BMET Recent Labs    06/26/18 0916 06/27/18 0353  NA 140 140  K 3.1* 3.2*  CL 104 109  CO2 29 24  GLUCOSE 98 98  BUN 24* 23  CREATININE 1.02* 0.91  CALCIUM 8.6* 8.3*   LFT Recent Labs    06/26/18 0916  PROT 7.3  ALBUMIN 4.1  AST 17  ALT 14  ALKPHOS 59  BILITOT 0.7     Active Problems:   Hypertension, essential, benign   Hyperlipidemia   Morbid obesity with BMI of 40.0-44.9, adult ([facility])   GERD (gastroesophageal reflux disease)   Hypokalemia   Hematochezia     LOS: 0 days   Tye Savoy ,NP 06/27/2018, 9:11 AM

## 2018-06-28 DIAGNOSIS — K921 Melena: Secondary | ICD-10-CM | POA: Diagnosis not present

## 2018-06-28 DIAGNOSIS — K5731 Diverticulosis of large intestine without perforation or abscess with bleeding: Secondary | ICD-10-CM | POA: Diagnosis not present

## 2018-06-28 LAB — CBC
HCT: 26.7 % — ABNORMAL LOW (ref 36.0–46.0)
Hemoglobin: 7.9 g/dL — ABNORMAL LOW (ref 12.0–15.0)
MCH: 23 pg — ABNORMAL LOW (ref 26.0–34.0)
MCHC: 29.6 g/dL — ABNORMAL LOW (ref 30.0–36.0)
MCV: 77.8 fL — ABNORMAL LOW (ref 80.0–100.0)
Platelets: 247 10*3/uL (ref 150–400)
RBC: 3.43 MIL/uL — ABNORMAL LOW (ref 3.87–5.11)
RDW: 15.4 % (ref 11.5–15.5)
WBC: 7.1 10*3/uL (ref 4.0–10.5)
nRBC: 0 % (ref 0.0–0.2)

## 2018-06-28 LAB — BASIC METABOLIC PANEL
Anion gap: 5 (ref 5–15)
BUN: 19 mg/dL (ref 8–23)
CO2: 26 mmol/L (ref 22–32)
Calcium: 8.3 mg/dL — ABNORMAL LOW (ref 8.9–10.3)
Chloride: 111 mmol/L (ref 98–111)
Creatinine, Ser: 0.78 mg/dL (ref 0.44–1.00)
GFR calc Af Amer: >60 mL/min (ref >60)
GFR calc non Af Amer: >60 mL/min (ref >60)
Glucose, Bld: 96 mg/dL (ref 70–99)
Potassium: 3.6 mmol/L (ref 3.5–5.1)
Sodium: 142 mmol/L (ref 135–145)

## 2018-06-28 MED ORDER — DIPHENHYDRAMINE HCL 25 MG PO CAPS
25.0000 mg | ORAL_CAPSULE | Freq: Once | ORAL | Status: AC
  Administered 2018-06-28: 25 mg via ORAL
  Filled 2018-06-28: qty 1

## 2018-06-28 MED ORDER — POLYETHYLENE GLYCOL 3350 17 G PO PACK: 17.0000 g | PACK | Freq: Every day | ORAL | 0 refills | Status: DC

## 2018-06-28 MED ORDER — FERROUS SULFATE 325 (65 FE) MG PO TABS: 325.0000 mg | ORAL_TABLET | Freq: Every day | ORAL | 1 refills | Status: DC

## 2018-06-28 MED ORDER — SODIUM CHLORIDE 0.9 % IV SOLN
510.0000 mg | Freq: Once | INTRAVENOUS | Status: AC
  Administered 2018-06-28: 510 mg via INTRAVENOUS
  Filled 2018-06-28: qty 17

## 2018-06-28 NOTE — Discharge Summary (Signed)
[facility]  Physician Discharge Summary   Patient ID: Jennifer Davila MRN: [id number] DOB/AGE: 04-Mar-1956 62 y.o.  Admit date: 06/26/2018 Discharge date: 06/28/2018  PCP: Martinique, [name], MD  DISCHARGE DIAGNOSES:  Hematochezia likely due to diverticulosis, resolved Acute blood loss anemia, stable Acute renal failure, resolved Essential hypertension Hyperlipidemia   RECOMMENDATIONS FOR OUTPATIENT FOLLOW UP: 1. Outpatient follow-up with primary care provider for blood work week to check hemoglobin 2. Iron levels to be checked in 4 to 6 weeks.   DISCHARGE CONDITION: fair  Diet recommendation: As before  Filed Weights   06/26/18 1700  Weight: 109 kg    INITIAL HISTORY: 62 y.o.femalewith medical history significant for hypertension, hyperlipidemia, GERD, history of diverticulosis who presents on 11/28/2019with 3 days of bright red blood per rectum.  She had similar episodes earlier this year in June.  She underwent colonoscopy at that time which did not show any active bleeding.  Patient was hospitalized for further management.  Patient seen by gastroenterology.  Consultations:  Gastroenterology  Procedures:  None    HOSPITAL COURSE:   Hematochezia Most likely diverticular in origin.  She she had a similar presentation in June and underwent colonoscopy at that time which did not identify any obvious source of the bleeding however thought to be diverticular.    Gastroenterology was consulted.  Bleeding scan was recommended however patient's bleeding subsided.  Hemoglobin did drop some but stable.  No recurrence of bleeding over the last 64 hours.  Cleared by gastroenterology for discharge.    Acute blood loss anemia/iron deficiency Secondary to hematochezia.  Hemoglobin did drop but stable.  Iron infusion ordered by gastroenterology.  Patient with a little ticklish sensation in the throat and nasal stuffiness after the infusion.  She was given a  dose of Benadryl and monitored for 2 hours.  She felt better and remained stable and wished to go home.  Discharged with oral iron as recommended by gastroenterology.  Will need to have iron levels checked in 4 to 6 weeks.  Acute renal failure Resolved with IV fluids  Hypokalemia This was repleted.  Essential hypertension Continue home medications.  Hyperlipidemia Statin.  Overall stable.  Okay for discharge home today.    PERTINENT LABS:  The results of significant diagnostics from this hospitalization (including imaging, microbiology, ancillary and laboratory) are listed below for reference.     Labs: Basic Metabolic Panel: Recent Labs  Lab 06/26/18 0916 06/27/18 0353 06/28/18 0410  NA 140 140 142  K 3.1* 3.2* 3.6  CL 104 109 111  CO2 29 24 26   GLUCOSE 98 98 96  BUN 24* 23 19  CREATININE 1.02* 0.91 0.78  CALCIUM 8.6* 8.3* 8.3*   Liver Function Tests: Recent Labs  Lab 06/26/18 0916  AST 17  ALT 14  ALKPHOS 74  BILITOT 0.7  PROT 7.3  ALBUMIN 4.1   CBC: Recent Labs  Lab 06/26/18 0916 06/26/18 1639 06/27/18 0011 06/27/18 0811 06/27/18 1332 06/28/18 0410  WBC 7.1 10.1 8.6 6.3  --  7.1  HGB 10.2* 9.2* 8.4* 7.9* 7.9* 7.9*  HCT 34.0* 31.1* 28.2* 26.4* 26.1* 26.7*  MCV 76.6* 76.6* 75.4* 77.4*  --  77.8*  PLT 265 265 265 223  --  247     IMAGING STUDIES No results found.  DISCHARGE EXAMINATION: Vitals:   06/27/18 1814 06/28/18 0137 06/28/18 0525 06/28/18 1242  BP: 139/79 (!) 142/88 133/67 (!) 164/95  Pulse: 87 78 77 93  Resp: 17 16 16  18  Temp: 99 F (37.2 C) 98.9 F (37.2 C) 98 F (36.7 C) 98.5 F (36.9 C)  TempSrc: Oral Oral Oral Oral  SpO2: 100% 100% 97% 100%  Weight:      Height:       General appearance: alert, cooperative, appears stated age and no distress Resp: clear to auscultation bilaterally Cardio: regular rate and rhythm, S1, S2 normal, no murmur, click, rub or gallop GI: soft, non-tender; bowel sounds normal; no  masses,  no organomegaly  DISPOSITION: Home  Discharge Instructions    Call MD for:  extreme fatigue   Complete by:  As directed    Call MD for:  persistant dizziness or light-headedness   Complete by:  As directed    Call MD for:  persistant nausea and vomiting   Complete by:  As directed    Call MD for:  severe uncontrolled pain   Complete by:  As directed    Call MD for:  temperature >100.4   Complete by:  As directed    Diet - low sodium heart healthy   Complete by:  As directed    Discharge instructions   Complete by:  As directed    Please seek attention immediately if bleeding recurs.  Take iron tablets daily.  Take MiraLAX daily.  Have your blood counts (Hemoglobin) rechecked by either your primary care provider or by your gastroenterologist in the next 1 to 2 weeks.  Have your primary care provider check your iron levels in 4 to 6 weeks.  You were cared for by a hospitalist during your hospital stay. If you have any questions about your discharge medications or the care you received while you were in the hospital after you are discharged, you can call the unit and asked to speak with the hospitalist on call if the hospitalist that took care of you is not available. Once you are discharged, your primary care physician will handle any further medical issues. Please note that NO REFILLS for any discharge medications will be authorized once you are discharged, as it is imperative that you return to your primary care physician (or establish a relationship with a primary care physician if you do not have one) for your aftercare needs so that they can reassess your need for medications and monitor your lab values. If you do not have a primary care physician, you can call [phone] for a physician referral.   Increase activity slowly   Complete by:  As directed          Allergies as of 06/28/2018   No Known Allergies     Medication List    STOP taking these medications     acetaminophen-codeine 300-30 MG tablet Commonly known as:  TYLENOL #3   feeding supplement (PRO-STAT SUGAR FREE 64) Liqd   meloxicam 15 MG tablet Commonly known as:  MOBIC   tiZANidine 4 MG tablet Commonly known as:  ZANAFLEX     TAKE these medications   esomeprazole 40 MG capsule Commonly known as:  NEXIUM Take 1 capsule (40 mg total) by mouth daily.   ferrous sulfate 325 (65 FE) MG tablet Take 1 tablet (325 mg total) by mouth daily with breakfast.   hydrochlorothiazide 25 MG tablet Commonly known as:  HYDRODIURIL Take 25 mg by mouth daily.   losartan 50 MG tablet Commonly known as:  COZAAR TAKE 1 TABLET BY MOUTH EVERY DAY What changed:  Another medication with the same name was removed. Continue taking this medication, and  follow the directions you see here.   polycarbophil 625 MG tablet Commonly known as:  FIBERCON Take 1 tablet (625 mg total) by mouth 2 (two) times daily.   polyethylene glycol packet Commonly known as:  MIRALAX / GLYCOLAX Take 17 g by mouth daily. What changed:    when to take this  reasons to take this   pravastatin 20 MG tablet Commonly known as:  PRAVACHOL TAKE 1 TABLET BY MOUTH EVERY DAY   TYLENOL 500 MG tablet Generic drug:  acetaminophen Take 1,000 mg by mouth daily as needed for moderate pain (pain).        Follow-up Information    Martinique, [name], MD. Schedule an appointment as soon as possible for a visit in 1 week(s).   Specialty:  Family Medicine Contact information: [place] [address] [phone]        Yetta Flock, MD. Schedule an appointment as soon as possible for a visit in 2 week(s).   Specialty:  Gastroenterology Contact information: [place] [address] [phone]           TOTAL DISCHARGE TIME: 43 minutes  Bonnielee Haff  [facility] Pager [phone]  06/28/2018, 2:23 PM

## 2018-06-28 NOTE — Progress Notes (Addendum)
          Daily Rounding Note  06/28/2018, 9:42 AM  LOS: 0 days   SUBJECTIVE:   Chief complaint: hematochezia, presumed diverticular bleed.     Stool is now black, dark transistioned from BR to dark yesterday.  Some abdominal discomfort in mid abdomen, not severe.  Has tolerated several solid meals, no nausea.  Feels ok, but some dizziness when she gets up.  Has not walked outside of her room.  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   OBJECTIVE:         Vital signs in last 24 hours:    Temp:  [98 F (36.7 C)-99 F (37.2 C)] 98 F (36.7 C) (11/30 0525) Pulse Rate:  [77-87] 77 (11/30 0525) Resp:  [16-17] 16 (11/30 0525) BP: (133-142)/(67-88) 133/67 (11/30 0525) SpO2:  [97 %-100 %] 97 % (11/30 0525) Last BM Date: 06/26/18 Filed Weights   06/26/18 1700  Weight: 109 kg   General: obese, comfortable, not ill looking.  Rooms smells strongly of fecal material.     Heart: RRR Chest: clear bil.  No sob or cough Abdomen: soft, NT.  Active BS  Extremities: no CCE Neuro/Psych:  Oriented x 3.  Moves all 4 limbs.    Intake/Output from previous day: 11/29 0701 - 11/30 0700 In: 650 [P.O.:650] Out: -   Intake/Output this shift: No intake/output data recorded.  Lab Results: Recent Labs    06/27/18 0011 06/27/18 0811 06/27/18 1332 06/28/18 0410  WBC 8.6 6.3  --  7.1  HGB 8.4* 7.9* 7.9* 7.9*  HCT 28.2* 26.4* 26.1* 26.7*  PLT 265 223  --  247   BMET Recent Labs    06/26/18 0916 06/27/18 0353 06/28/18 0410  NA 140 140 142  K 3.1* 3.2* 3.6  CL 104 109 111  CO2 29 24 26   GLUCOSE 98 98 96  BUN 24* 23 19  CREATININE 1.02* 0.91 0.78  CALCIUM 8.6* 8.3* 8.3*   LFT Recent Labs    06/26/18 0916  PROT 7.3  ALBUMIN 4.1  AST 17  ALT 14  ALKPHOS 74  BILITOT 0.7   PT/INR No results for input(s): LABPROT, INR in the last 72 hours. Hepatitis Panel No  results for input(s): HEPBSAG, HCVAB, HEPAIGM, HEPBIGM in the last 72 hours.  Studies/Results: No results found.  ASSESMENT:   *  Bleeding PR, hematochezia.  Suspect recurrent diverticular bleeding in pt with pan-diverticulosis.   Colonoscopies in 09/2016 (2 TA polyps and scattered pan diverticulosis)and 12/2017 (Pan diverticulosis, blood clots throughout, no active bleeding)  *   Acute on chronic microcytic anemia.  Hgb 11.1 >> 9.2 > 7.9 x 3.  Baseline ~ 11.    PLAN   *  ? Home today?  OK with Dr Rush Landmark. But needs to get IV iron before going home. Ordered Feraheme infusion.   Start oral iron Ferrous sulfate 325 mg daily at discharge    Ought to get iron studies in 4 to 6 weeks, can be done at [facility] office.     Dr Havery Moros is GI MD, can fup with him prn.      Jennifer Davila  06/28/2018, 9:42 AM Phone [phone]

## 2018-06-28 NOTE — Discharge Instructions (Signed)
Gastrointestinal Bleeding °Gastrointestinal (GI) bleeding is bleeding somewhere along the digestive tract, between the mouth and anus. This can be caused by various problems. The severity of these problems can range from mild to serious or even life-threatening. If you have GI bleeding, you may find blood in your stools (feces), you may have black stools, or you may vomit blood. If there is a lot of bleeding, you may need to stay in the hospital. °What are the causes? °This condition may be caused by: °· Esophagitis. This is inflammation, irritation, or swelling of the esophagus. °· Hemorrhoids. These are swollen veins in the rectum. °· Anal fissures. These are areas of painful tearing that are often caused by passing hard stool. °· Diverticulosis. These are pouches that form on the colon over time, with age, and may bleed a lot. °· Diverticulitis. This is inflammation in areas with diverticulosis. It can cause pain, fever, and bloody stools, although bleeding may be mild. °· Polyps and cancer. Colon cancer often starts out as precancerous polyps. °· Gastritis and ulcers. With these, bleeding may come from the upper GI tract, near the stomach. ° °What are the signs or symptoms? °Symptoms of this condition may include: °· Bright red blood in your vomit, or vomit that looks like coffee grounds. °· Bloody, black, or tarry stools. °? Bleeding from the lower GI tract will usually cause red or maroon blood in the stools. °? Bleeding from the upper GI tract may cause black, tarry, often bad-smelling stools. °? In certain cases, if the bleeding is fast enough, the stools may be red. °· Pain or cramping in the abdomen. ° °How is this diagnosed? °This condition may be diagnosed based on: °· Medical history and physical exam. °· Various tests, such as: °? Blood tests. °? X-rays and other imaging tests. °? Esophagogastroduodenoscopy (EGD). In this test, a flexible, lighted tube is used to look at your esophagus, stomach, and  small intestine. °? Colonoscopy. In this test, a flexible, lighted tube is used to look at your colon. ° °How is this treated? °Treatment for this condition depends on the cause of the bleeding. For example: °· For bleeding from the esophagus, stomach, small intestine, or colon, the health care provider doing your EGD or colonoscopy may be able to stop the bleeding as part of the procedure. °· Inflammation or infection of the colon can be treated with medicines. °· Certain rectal problems can be treated with creams, suppositories, or warm baths. °· Surgery is sometimes needed. °· Blood transfusions are sometimes needed if a lot of blood has been lost. ° °If bleeding is slow, you may be allowed to go home. If there is a lot of bleeding, you will need to stay in the hospital for observation. °Follow these instructions at home: °· Take over-the-counter and prescription medicines only as told by your health care provider. °· Eat foods that are high in fiber. This will help to keep your stools soft. These foods include whole grains, legumes, fruits, and vegetables. Eating 1-3 prunes each day works well for many people. °· Drink enough fluid to keep your urine clear or pale yellow. °· Keep all follow-up visits as told by your health care provider. This is important. °Contact a health care provider if: °· Your symptoms do not improve. °Get help right away if: °· Your bleeding increases. °· You feel light-headed or you faint. °· You feel weak. °· You have severe cramps in your back or abdomen. °· You pass large blood clots in your stool. °·   Your symptoms are getting worse. °This information is not intended to replace advice given to you by your health care provider. Make sure you discuss any questions you have with your health care provider. °Document Released: 07/13/2000 Document Revised: 12/14/2015 Document Reviewed: 01/03/2015 °Elsevier Interactive Patient Education © 2018 [employer] ° °

## 2018-06-28 NOTE — Progress Notes (Signed)
Patient c/o nasal stuffiness and tickling in the back of her throat following IV Iron infusion.  MD notified and one time does of ibuprofen administered. VSS. Will continue to monitor.

## 2018-06-28 NOTE — Progress Notes (Signed)
Patient discharged to home, all discharge medications and instructions reviewed and questions answered.  Patient to be assisted to vehicle by wheelchair.  

## 2018-07-01 ENCOUNTER — Telehealth: Payer: Self-pay | Admitting: Physical Therapy

## 2018-07-01 ENCOUNTER — Ambulatory Visit: Payer: BLUE CROSS/BLUE SHIELD | Attending: Family Medicine | Admitting: Physical Therapy

## 2018-07-01 NOTE — Telephone Encounter (Signed)
Patient did not show for appointment.  Patient was called and PT left message to please call us back.  Zannie Cove, PT 07/01/18 8:21 AM

## 2018-07-03 ENCOUNTER — Ambulatory Visit: Payer: BLUE CROSS/BLUE SHIELD | Admitting: Physical Therapy

## 2018-07-07 ENCOUNTER — Ambulatory Visit: Payer: BLUE CROSS/BLUE SHIELD | Admitting: Family Medicine

## 2018-07-07 ENCOUNTER — Encounter: Payer: Self-pay | Admitting: Family Medicine

## 2018-07-07 VITALS — BP 152/82 | HR 83 | Temp 98.3°F | Resp 16 | Ht 62.0 in | Wt 241.6 lb

## 2018-07-07 DIAGNOSIS — K5900 Constipation, unspecified: Secondary | ICD-10-CM

## 2018-07-07 DIAGNOSIS — K219 Gastro-esophageal reflux disease without esophagitis: Secondary | ICD-10-CM

## 2018-07-07 DIAGNOSIS — K5731 Diverticulosis of large intestine without perforation or abscess with bleeding: Secondary | ICD-10-CM

## 2018-07-07 DIAGNOSIS — I10 Essential (primary) hypertension: Secondary | ICD-10-CM

## 2018-07-07 DIAGNOSIS — D509 Iron deficiency anemia, unspecified: Secondary | ICD-10-CM

## 2018-07-07 LAB — BASIC METABOLIC PANEL
BUN: 16 mg/dL (ref 6–23)
CO2: 28 mEq/L (ref 19–32)
Calcium: 8.7 mg/dL (ref 8.4–10.5)
Chloride: 105 mEq/L (ref 96–112)
Creatinine, Ser: 0.98 mg/dL (ref 0.40–1.20)
GFR: 73.32 mL/min (ref >60.00)
Glucose, Bld: 104 mg/dL — ABNORMAL HIGH (ref 70–99)
Potassium: 3.4 mEq/L — ABNORMAL LOW (ref 3.5–5.1)
Sodium: 141 mEq/L (ref 135–145)

## 2018-07-07 LAB — CBC
HCT: 30.6 % — ABNORMAL LOW (ref 36.0–46.0)
Hemoglobin: 9.6 g/dL — ABNORMAL LOW (ref 12.0–15.0)
MCHC: 31.4 g/dL (ref 30.0–36.0)
MCV: 75.7 fl — ABNORMAL LOW (ref 78.0–100.0)
Platelets: 334 10*3/uL (ref 150.0–400.0)
RBC: 4.04 Mil/uL (ref 3.87–5.11)
RDW: 18.6 % — ABNORMAL HIGH (ref 11.5–15.5)
WBC: 7.3 10*3/uL (ref 4.0–10.5)

## 2018-07-07 MED ORDER — LOSARTAN POTASSIUM 100 MG PO TABS: 100.0000 mg | ORAL_TABLET | Freq: Every day | ORAL | 1 refills | Status: DC

## 2018-07-07 MED ORDER — ESOMEPRAZOLE MAGNESIUM 40 MG PO CPDR: 40.0000 mg | DELAYED_RELEASE_CAPSULE | Freq: Every day | ORAL | 3 refills | Status: DC

## 2018-07-07 MED ORDER — POLYETHYLENE GLYCOL 3350 17 G PO PACK: 17.0000 g | PACK | Freq: Every day | ORAL | 3 refills | Status: DC

## 2018-07-07 MED ORDER — CALCIUM POLYCARBOPHIL 625 MG PO TABS: 625.0000 mg | ORAL_TABLET | Freq: Two times a day (BID) | ORAL | 2 refills | Status: DC

## 2018-07-07 NOTE — Assessment & Plan Note (Signed)
Symptomatic. Resume Nexium 40 mg daily. GERD precautions also recommended.

## 2018-07-07 NOTE — Assessment & Plan Note (Signed)
Adequate fiber and fluid intake. Continue daily MiraLAX and fiber supplementation. Follow-up in 2 months.

## 2018-07-07 NOTE — Patient Instructions (Addendum)
A few things to remember from today's visit:   Hypertension, essential, benign - Plan: Basic metabolic panel, losartan (COZAAR) 100 MG tablet  Diverticulosis of colon with hemorrhage  Constipation, unspecified constipation type - Plan: polyethylene glycol (MIRALAX / GLYCOLAX) packet, polycarbophil (FIBERCON) 625 MG tablet  Iron deficiency anemia, unspecified iron deficiency anemia type - Plan: CBC  Gastroesophageal reflux disease, esophagitis presence not specified - Plan: esomeprazole (NEXIUM) 40 MG capsule   Today Losartan increased to 100 mg.   Please be sure medication list is accurate. If a new problem present, please set up appointment sooner than planned today.

## 2018-07-07 NOTE — Assessment & Plan Note (Signed)
Bleeding has resolved. Avoid constipation. Instructed about warning signs.

## 2018-07-07 NOTE — Assessment & Plan Note (Signed)
BP is not at goal, recheck 160/95. Losartan increased from 50 mg to 100 mg. No changes on HCTZ 25 mg. Instructed to monitor BP periodically. Continue low-salt diet. Follow-up in 2 months, before if needed.

## 2018-07-07 NOTE — Assessment & Plan Note (Signed)
Continue ferrous sulfate 325 mg daily. Further recommendation will be given according to CBC results. Follow-up in 2 months, before if needed.

## 2018-07-07 NOTE — Progress Notes (Signed)
HPI:   [name] is a 62 y.o. female, who is here today to follow on recent horpitalization. She was admitted on 06/26/2018 because lower GI bleeding and symptomatic anemia. Discharged on 06/28/2017. Discharge diagnosis: Hematochezia likely due to diverticulosis, resolved. Acute renal failure, resolved. Acute blood loss anemia, stable.  She has not had any other episodes of rectal bleeding since 06/27/2017, when she had a black stool. Currently she is on ferrous sulfate 325 mg daily.   Lab Results  Component Value Date   WBC 7.1 06/28/2018   HGB 7.9 (L) 06/28/2018   HCT 26.7 (L) 06/28/2018   MCV 77.8 (L) 06/28/2018   PLT 247 06/28/2018   HTN: Currently she is on Losartan 50 mg daily and HCTZ 25 mg daily. She is not checking BP. Denies severe/frequent headache, visual changes, chest pain, dyspnea, palpitation, focal weakness, or edema.   Lab Results  Component Value Date   CREATININE 0.78 06/28/2018   BUN 19 06/28/2018   NA 142 06/28/2018   K 3.6 06/28/2018   CL 111 06/28/2018   CO2 26 06/28/2018   For constipation she is currently on MiraLAX daily and fiber laxative plus calcium.   GERD: She is requesting for refill on Nexium 40 mg. She ran out of medication a few days ago and has had "terrible" heartburn. She denies abdominal pain, nausea, vomiting. Heartburn is exacerbated by certain food and beverage (coffee).   Review of Systems  Constitutional: Positive for fatigue. Negative for activity change, appetite change and fever.  HENT: Negative for mouth sores, nosebleeds and trouble swallowing.   Respiratory: Negative for cough, shortness of breath and wheezing.   Cardiovascular: Negative for chest pain, palpitations and leg swelling.  Gastrointestinal: Negative for abdominal pain, nausea and vomiting.       Negative for changes in bowel habits.  Genitourinary: Negative for decreased urine volume, dysuria and hematuria.    Musculoskeletal: Positive for myalgias. Negative for gait problem and joint swelling.  Neurological: Negative for syncope, weakness and headaches.  Psychiatric/Behavioral: Negative for confusion. The patient is nervous/anxious.       Current Outpatient Medications on File Prior to Visit  Medication Sig Dispense Refill  . acetaminophen (TYLENOL) 500 MG tablet Take 1,000 mg by mouth daily as needed for moderate pain (pain).     . ferrous sulfate 325 (65 FE) MG tablet Take 1 tablet (325 mg total) by mouth daily with breakfast. 30 tablet 1  . hydrochlorothiazide (HYDRODIURIL) 25 MG tablet Take 25 mg by mouth daily.   0  . pravastatin (PRAVACHOL) 20 MG tablet TAKE 1 TABLET BY MOUTH EVERY DAY 90 tablet 1   No current facility-administered medications on file prior to visit.      Past Medical History:  Diagnosis Date  . Arthritis   . Constipation    on stool softener  . GERD (gastroesophageal reflux disease)   . Hyperlipidemia   . Hypertension   . Trouble swallowing 2013   following lap band   No Known Allergies  Social History   Socioeconomic History  . Marital status: Married    Spouse name: Not on file  . Number of children: 1  . Years of education: Not on file  . Highest education level: Not on file  Occupational History  . Not on file  Social Needs  . Financial resource strain: Not on file  . Food insecurity:    Worry: Not on file    Inability: Not on  file  . Transportation needs:    Medical: Not on file    Non-medical: Not on file  Tobacco Use  . Smoking status: Former Smoker    Types: Cigarettes    Last attempt to quit: 10/02/1972    Years since quitting: 45.8  . Smokeless tobacco: Never Used  Substance and Sexual Activity  . Alcohol use: No  . Drug use: No  . Sexual activity: Never  Lifestyle  . Physical activity:    Days per week: Not on file    Minutes per session: Not on file  . Stress: Not on file  Relationships  . Social connections:    Talks on  phone: Not on file    Gets together: Not on file    Attends religious service: Not on file    Active member of club or organization: Not on file    Attends meetings of clubs or organizations: Not on file    Relationship status: Not on file  Other Topics Concern  . Not on file  Social History Narrative  . Not on file    Vitals:   07/07/18 1023  BP: (!) 152/82  Pulse: 83  Resp: 16  Temp: 98.3 F (36.8 C)  SpO2: 98%   Body mass index is 44.19 kg/m.      Physical Exam  Nursing note and vitals reviewed. Constitutional: She is oriented to person, place, and time. She appears well-developed. No distress.  HENT:  Head: Normocephalic and atraumatic.  Mouth/Throat: Oropharynx is clear and moist and mucous membranes are normal.  Eyes: Pupils are equal, round, and reactive to light. Conjunctivae are normal.  Cardiovascular: Normal rate and regular rhythm.  No murmur heard. Pulses:      Dorsalis pedis pulses are 2+ on the right side, and 2+ on the left side.  Respiratory: Effort normal and breath sounds normal. No respiratory distress.  GI: Soft. She exhibits no mass. There is no hepatomegaly. There is no tenderness.  Musculoskeletal:        General: No edema.  Lymphadenopathy:    She has no cervical adenopathy.  Neurological: She is alert and oriented to person, place, and time. She has normal strength. No cranial nerve deficit. Gait normal.  Skin: Skin is warm. No rash noted. No erythema.  Psychiatric: Her mood appears anxious.  Well groomed, good eye contact.    ASSESSMENT AND PLAN:  [name] was seen today for follow-up.   Orders Placed This Encounter  Procedures  . CBC  . Basic metabolic panel   Lab Results  Component Value Date   WBC 7.3 07/07/2018   HGB 9.6 (L) 07/07/2018   HCT 30.6 (L) 07/07/2018   MCV 75.7 (L) 07/07/2018   PLT 334.0 07/07/2018   Lab Results  Component Value Date   CREATININE 0.98 07/07/2018   BUN 16 07/07/2018   NA 141  07/07/2018   K 3.4 (L) 07/07/2018   CL 105 07/07/2018   CO2 28 07/07/2018     Hypertension, essential, benign BP is not at goal, recheck 160/95. Losartan increased from 50 mg to 100 mg. No changes on HCTZ 25 mg. Instructed to monitor BP periodically. Continue low-salt diet. Follow-up in 2 months, before if needed.  GERD (gastroesophageal reflux disease) Symptomatic. Resume Nexium 40 mg daily. GERD precautions also recommended.  Constipation Adequate fiber and fluid intake. Continue daily MiraLAX and fiber supplementation. Follow-up in 2 months.  Diverticulosis of colon with hemorrhage Bleeding has resolved. Avoid constipation. Instructed about warning  signs.  Iron deficiency anemia Continue ferrous sulfate 325 mg daily. Further recommendation will be given according to CBC results. Follow-up in 2 months, before if needed.      Breck Maryland G. Martinique, MD  [facility] [place] office.

## 2018-07-07 NOTE — Assessment & Plan Note (Deleted)
Bleeding has resolved. Avoid constipation. Instructed about warning signs.

## 2018-07-11 ENCOUNTER — Other Ambulatory Visit: Payer: Self-pay | Admitting: Family Medicine

## 2018-08-04 ENCOUNTER — Ambulatory Visit: Payer: BLUE CROSS/BLUE SHIELD | Admitting: Family Medicine

## 2018-09-05 ENCOUNTER — Encounter: Payer: Self-pay | Admitting: Family Medicine

## 2018-09-08 ENCOUNTER — Ambulatory Visit: Payer: BLUE CROSS/BLUE SHIELD | Admitting: Family Medicine

## 2018-09-08 DIAGNOSIS — Z0289 Encounter for other administrative examinations: Secondary | ICD-10-CM

## 2018-11-18 ENCOUNTER — Encounter: Payer: Self-pay | Admitting: Family Medicine

## 2018-11-18 ENCOUNTER — Other Ambulatory Visit: Payer: Self-pay

## 2018-11-18 ENCOUNTER — Other Ambulatory Visit: Payer: Self-pay | Admitting: Family Medicine

## 2018-11-18 ENCOUNTER — Ambulatory Visit (INDEPENDENT_AMBULATORY_CARE_PROVIDER_SITE_OTHER): Payer: Medicare HMO | Admitting: Family Medicine

## 2018-11-18 VITALS — HR 60 | Resp 12

## 2018-11-18 DIAGNOSIS — Z6841 Body Mass Index (BMI) 40.0 and over, adult: Secondary | ICD-10-CM

## 2018-11-18 DIAGNOSIS — I10 Essential (primary) hypertension: Secondary | ICD-10-CM | POA: Diagnosis not present

## 2018-11-18 DIAGNOSIS — E785 Hyperlipidemia, unspecified: Secondary | ICD-10-CM

## 2018-11-18 DIAGNOSIS — K921 Melena: Secondary | ICD-10-CM | POA: Diagnosis not present

## 2018-11-18 DIAGNOSIS — K219 Gastro-esophageal reflux disease without esophagitis: Secondary | ICD-10-CM

## 2018-11-18 DIAGNOSIS — D509 Iron deficiency anemia, unspecified: Secondary | ICD-10-CM

## 2018-11-18 DIAGNOSIS — E876 Hypokalemia: Secondary | ICD-10-CM

## 2018-11-18 DIAGNOSIS — K5900 Constipation, unspecified: Secondary | ICD-10-CM | POA: Diagnosis not present

## 2018-11-18 MED ORDER — PRAVASTATIN SODIUM 20 MG PO TABS: 20.0000 mg | ORAL_TABLET | Freq: Every day | ORAL | 0 refills | Status: DC

## 2018-11-18 MED ORDER — FERROUS SULFATE 325 (65 FE) MG PO TABS: 325.0000 mg | ORAL_TABLET | Freq: Every day | ORAL | 3 refills | Status: DC

## 2018-11-18 MED ORDER — ESOMEPRAZOLE MAGNESIUM 40 MG PO CPDR: 40.0000 mg | DELAYED_RELEASE_CAPSULE | Freq: Every day | ORAL | 3 refills | Status: DC

## 2018-11-18 NOTE — Progress Notes (Signed)
Virtual Visit via Video Note   I connected with Ms Lafever on 11/18/18 at  3:15 PM EDT by a video enabled telemedicine application and verified that I am speaking with the correct person using two identifiers.  Location patient: home Location provider:work or home office Persons participating in the virtual visit: patient, provider  I discussed the limitations of evaluation and management by telemedicine and the availability of in person appointments. She expressed understanding and agreed to proceed.   HPI: Ms Shaikh is a 63 yo with Hx of back pain,constipation,and iron def anemia among some. Last colonoscopy 12/2017: Diverticulosis entire colon,internal hemorrhoids,and blood clots entire colon with no active bleeding. She is not taken Fe Sulfate,aggravates constipation.  She is taking Miralax daily and having bowel movements daily.  Denies abdominal pain, vomiting, changes in bowel habits,or melena. Denies pica.  Small blood clot 3 weeks ago,the size of a lady bug. "Little" nausea. GERD on Nexium.  She denies fever, chills, fatigue, abnormal weight loss, gross hematuria, decreased urine output, or foamy urine.   Lab Results  Component Value Date   WBC 7.3 07/07/2018   HGB 9.6 (L) 07/07/2018   HCT 30.6 (L) 07/07/2018   MCV 75.7 (L) 07/07/2018   PLT 334.0 07/07/2018   HTN: Denies headache,visual changes, chest pain, palpitations, dyspnea,diaphoresis,or edema.  She is on Losartan 100 mg daily and HCTZ 25 mg daily. She is checking BP sometimes,does not remember readings. She does not know where she has her BP monitor now to check BP.  HypoK+,she is not having leg cramps.  Lab Results  Component Value Date   CREATININE 0.98 07/07/2018   BUN 16 07/07/2018   NA 141 07/07/2018   K 3.4 (L) 07/07/2018   CL 105 07/07/2018   CO2 28 07/07/2018    C/O increased appetite. She has gained some wt. Yesterday she started walking in place. She has not been consistent with  following a healthful diet.   ROS: See pertinent positives and negatives per HPI. COVID-19 screening questions: Denies new fever,cough,sore throat,or possible exposure to COVID-19. Negative for loss in the sense of smell or taste.   Past Medical History:  Diagnosis Date  . Arthritis   . Constipation    on stool softener  . GERD (gastroesophageal reflux disease)   . Hyperlipidemia   . Hypertension   . Trouble swallowing 2013   following lap band    Past Surgical History:  Procedure Laterality Date  . ABDOMINAL HYSTERECTOMY    . CHOLECYSTECTOMY    . COLONOSCOPY N/A 01/08/2018   Procedure: COLONOSCOPY;  Surgeon: Yetta Flock, MD;  Location: [facility];  Service: Gastroenterology;  Laterality: N/A;  . LAPAROSCOPIC GASTRIC BANDING    . LAPAROSCOPIC REPAIR AND REMOVAL OF GASTRIC BAND  2013-2014    Family History  Problem Relation Age of Onset  . Hypertension Mother   . Stroke Mother   . Diabetes Father   . Alcohol abuse Father   . Kidney disease Father   . Colon polyps Brother   . Cancer Brother   . Alcohol abuse Brother     Social History   Socioeconomic History  . Marital status: Married    Spouse name: Not on file  . Number of children: 1  . Years of education: Not on file  . Highest education level: Not on file  Occupational History  . Not on file  Social Needs  . Financial resource strain: Not on file  . Food insecurity:    Worry:  Not on file    Inability: Not on file  . Transportation needs:    Medical: Not on file    Non-medical: Not on file  Tobacco Use  . Smoking status: Former Smoker    Types: Cigarettes    Last attempt to quit: 10/02/1972    Years since quitting: 46.1  . Smokeless tobacco: Never Used  Substance and Sexual Activity  . Alcohol use: No  . Drug use: No  . Sexual activity: Never  Lifestyle  . Physical activity:    Days per week: Not on file    Minutes per session: Not on file  . Stress: Not on file  Relationships  .  Social connections:    Talks on phone: Not on file    Gets together: Not on file    Attends religious service: Not on file    Active member of club or organization: Not on file    Attends meetings of clubs or organizations: Not on file    Relationship status: Not on file  . Intimate partner violence:    Fear of current or ex partner: Not on file    Emotionally abused: Not on file    Physically abused: Not on file    Forced sexual activity: Not on file  Other Topics Concern  . Not on file  Social History Narrative  . Not on file      Current Outpatient Medications:  .  acetaminophen (TYLENOL) 500 MG tablet, Take 1,000 mg by mouth daily as needed for moderate pain (pain). , Disp: , Rfl:  .  esomeprazole (NEXIUM) 40 MG capsule, Take 1 capsule (40 mg total) by mouth daily., Disp: 90 capsule, Rfl: 3 .  ferrous sulfate 325 (65 FE) MG tablet, Take 1 tablet (325 mg total) by mouth daily with breakfast., Disp: 90 tablet, Rfl: 3 .  hydrochlorothiazide (HYDRODIURIL) 25 MG tablet, TAKE 1 TABLET BY MOUTH EVERY DAY, Disp: 90 tablet, Rfl: 1 .  losartan (COZAAR) 100 MG tablet, Take 1 tablet (100 mg total) by mouth daily., Disp: 90 tablet, Rfl: 1 .  polycarbophil (FIBERCON) 625 MG tablet, Take 1 tablet (625 mg total) by mouth 2 (two) times daily., Disp: 180 tablet, Rfl: 2 .  polyethylene glycol (MIRALAX / GLYCOLAX) packet, Take 17 g by mouth daily., Disp: 14 each, Rfl: 3 .  pravastatin (PRAVACHOL) 20 MG tablet, Take 1 tablet (20 mg total) by mouth daily., Disp: 90 tablet, Rfl: 0  EXAM:  VITALS per patient if applicable:Pulse 60   Resp 12   GENERAL: alert, oriented, appears well and in no acute distress  HEENT: atraumatic, conjunttiva clear, no obvious facial abnormalities on inspection.  LUNGS: on inspection no signs of respiratory distress, breathing rate appears normal, no obvious gross SOB, gasping or wheezing  CV: no obvious cyanosis  MS: moves all visible extremities without noticeable  abnormality  PSYCH/NEURO: pleasant and cooperative, no obvious depression, + anxious. Speech and thought processing grossly intact  ASSESSMENT AND PLAN:  Discussed the following assessment and plan:  Hypertension, essential, benign BP adequately controlled. No changes in losartan 100 mg daily or HCTZ 25 mg daily. Recommend monitoring BP more often. We will plan on following in the office in 2 months.  Morbid obesity with BMI of 40.0-44.9, adult ([place]) We discussed benefits of wt loss as well as adverse effects of obesity. Consistency with healthy diet and physical activity recommended. Working in place for 15+ minutes daily to continue.  Hypokalemia Mild. Continue potassium rich diet.  We will plan on checking BMP next visit.  Constipation Problem seems to be well controlled with MiraLAX. Adequate hydration and fiber intake. Instructed about warning signs.  Hematochezia She has not had any problem in 3 weeks. Asymptomatic, so I do not think blood work is needed at this time. She was clearly instructed about warning signs.  Iron deficiency anemia Recommend resuming ferrous sulfate 325 mg daily. Asymptomatic, so I think we can hold on CBC until next visit in 3 months, before if needed. We discussed warning signs, she was understanding.  We are opting to hold on labs for now because stay home recommendation due to current COVID-19 pandemia.She does not want to have labs done either at this time because very afraid of becoming in contact with coronavirus infection.   I discussed the assessment and treatment plan with the patient. She was provided an opportunity to ask questions and all were answered. She agreed with the plan and demonstrated an understanding of the instructions.   Return in about 2 months (around 01/18/2019) for HTN,anemia.    Gianny Sabino [place], MD

## 2018-11-18 NOTE — Assessment & Plan Note (Signed)
Problem seems to be well controlled with MiraLAX. Adequate hydration and fiber intake. Instructed about warning signs.

## 2018-11-18 NOTE — Assessment & Plan Note (Signed)
BP adequately controlled. No changes in losartan 100 mg daily or HCTZ 25 mg daily. Recommend monitoring BP more often. We will plan on following in the office in 2 months.

## 2018-11-18 NOTE — Assessment & Plan Note (Signed)
Mild. Continue potassium rich diet.   We will plan on checking BMP next visit.

## 2018-11-18 NOTE — Assessment & Plan Note (Signed)
She has not had any problem in 3 weeks. Asymptomatic, so I do not think blood work is needed at this time. She was clearly instructed about warning signs.

## 2018-11-18 NOTE — Assessment & Plan Note (Signed)
We discussed benefits of wt loss as well as adverse effects of obesity. Consistency with healthy diet and physical activity recommended. Working in place for 15+ minutes daily to continue.

## 2018-11-18 NOTE — Telephone Encounter (Signed)
Left VM that Rx she is requesting was at [facility] on [address] per the [facility] pharmacy . It was transferred to that location.  Call placed to [facility] [address]. They said that the RX has expired.  Call paced to Ms Owens Shark. She understand that the nexium she ordered will be refilled.  She is also needing her pravastatin reordered.

## 2018-11-18 NOTE — Telephone Encounter (Signed)
Requested Prescriptions  Pending Prescriptions Disp Refills  . pravastatin (PRAVACHOL) 20 MG tablet 90 tablet 0    Sig: Take 1 tablet (20 mg total) by mouth daily.     Cardiovascular:  Antilipid - Statins Failed - 11/18/2018  2:27 PM      Failed - Total Cholesterol in normal range and within 360 days    Cholesterol  Date Value Ref Range Status  09/04/2017 197 0 - 200 mg/dL Final    Comment:    ATP III Classification       Desirable:  < 200 mg/dL               Borderline High:  200 - 239 mg/dL          High:  > = 240 mg/dL         Failed - LDL in normal range and within 360 days    LDL Cholesterol  Date Value Ref Range Status  09/04/2017 134 (H) 0 - 99 mg/dL Final         Failed - HDL in normal range and within 360 days    HDL  Date Value Ref Range Status  09/04/2017 38.90 (L) >39.00 mg/dL Final         Failed - Triglycerides in normal range and within 360 days    Triglycerides  Date Value Ref Range Status  09/04/2017 122.0 0.0 - 149.0 mg/dL Final    Comment:    Normal:  <150 mg/dLBorderline High:  150 - 199 mg/dL         Passed - Patient is not pregnant      Passed - Valid encounter within last 12 months    Recent Outpatient Visits          4 months ago Hypertension, essential, benign   [facility] [place], Malka So, MD   4 months ago Lower GI bleed   [facility] [place], Malka So, MD   6 months ago Gastroesophageal reflux disease, esophagitis presence not specified   [facility] [place], Malka So, MD   10 months ago Hypertension, essential, benign   [facility]rap[facility] [place], Malka So, MD   10 months ago Bright red rectal bleeding   [facility]rap[facility] [place], Malka So, MD      Future Appointments            Today [place], Malka So, MD [facility] [place]

## 2018-11-18 NOTE — Assessment & Plan Note (Signed)
Recommend resuming ferrous sulfate 325 mg daily. Asymptomatic, so I think we can hold on CBC until next visit in 3 months, before if needed. We discussed warning signs, she was understanding.

## 2018-11-21 ENCOUNTER — Other Ambulatory Visit: Payer: Self-pay | Admitting: *Deleted

## 2018-11-21 ENCOUNTER — Telehealth: Payer: Self-pay | Admitting: Family Medicine

## 2018-11-21 DIAGNOSIS — D509 Iron deficiency anemia, unspecified: Secondary | ICD-10-CM

## 2018-11-21 MED ORDER — FERROUS SULFATE 325 (65 FE) MG PO TABS: 325.0000 mg | ORAL_TABLET | Freq: Every day | ORAL | 3 refills | Status: DC

## 2018-11-21 NOTE — Telephone Encounter (Signed)
Rx resent to the pharmacy, Rx was sent on 11/18/2018, but it stated that Rx was printed.

## 2018-11-21 NOTE — Telephone Encounter (Signed)
Copied from [place] [id number]. Topic: Quick Communication - Rx Refill/Question >> Nov 21, 2018 10:57 AM Andria Frames L wrote: Medication: ferrous sulfate 325 (65 FE) MG tablet [[id number]]  pt called and stated that pharmacy did not receive medication

## 2018-12-31 ENCOUNTER — Other Ambulatory Visit: Payer: Self-pay | Admitting: *Deleted

## 2018-12-31 DIAGNOSIS — I10 Essential (primary) hypertension: Secondary | ICD-10-CM

## 2018-12-31 DIAGNOSIS — E785 Hyperlipidemia, unspecified: Secondary | ICD-10-CM

## 2018-12-31 MED ORDER — PRAVASTATIN SODIUM 20 MG PO TABS: 20.0000 mg | ORAL_TABLET | Freq: Every day | ORAL | 1 refills | Status: DC

## 2018-12-31 MED ORDER — HYDROCHLOROTHIAZIDE 25 MG PO TABS: 25.0000 mg | ORAL_TABLET | Freq: Every day | ORAL | 1 refills | Status: DC

## 2018-12-31 MED ORDER — LOSARTAN POTASSIUM 100 MG PO TABS: 100.0000 mg | ORAL_TABLET | Freq: Every day | ORAL | 1 refills | Status: DC

## 2019-02-14 ENCOUNTER — Other Ambulatory Visit: Payer: Self-pay | Admitting: Family Medicine

## 2019-02-14 DIAGNOSIS — E785 Hyperlipidemia, unspecified: Secondary | ICD-10-CM

## 2019-03-19 DIAGNOSIS — I10 Essential (primary) hypertension: Secondary | ICD-10-CM | POA: Diagnosis not present

## 2019-03-19 DIAGNOSIS — H524 Presbyopia: Secondary | ICD-10-CM | POA: Diagnosis not present

## 2019-03-19 DIAGNOSIS — Z135 Encounter for screening for eye and ear disorders: Secondary | ICD-10-CM | POA: Diagnosis not present

## 2019-03-19 DIAGNOSIS — E7800 Pure hypercholesterolemia, unspecified: Secondary | ICD-10-CM | POA: Diagnosis not present

## 2019-03-20 ENCOUNTER — Encounter: Payer: Self-pay | Admitting: Family Medicine

## 2019-03-20 ENCOUNTER — Other Ambulatory Visit: Payer: Self-pay

## 2019-03-20 ENCOUNTER — Ambulatory Visit (INDEPENDENT_AMBULATORY_CARE_PROVIDER_SITE_OTHER): Payer: Medicare HMO | Admitting: Family Medicine

## 2019-03-20 VITALS — BP 130/90 | HR 86 | Temp 97.8°F | Resp 12 | Ht 62.0 in | Wt 258.2 lb

## 2019-03-20 DIAGNOSIS — D509 Iron deficiency anemia, unspecified: Secondary | ICD-10-CM | POA: Diagnosis not present

## 2019-03-20 DIAGNOSIS — M79604 Pain in right leg: Secondary | ICD-10-CM

## 2019-03-20 DIAGNOSIS — I10 Essential (primary) hypertension: Secondary | ICD-10-CM

## 2019-03-20 DIAGNOSIS — M79605 Pain in left leg: Secondary | ICD-10-CM

## 2019-03-20 DIAGNOSIS — E876 Hypokalemia: Secondary | ICD-10-CM

## 2019-03-20 LAB — BASIC METABOLIC PANEL
BUN: 17 mg/dL (ref 6–23)
CO2: 32 mEq/L (ref 19–32)
Calcium: 9 mg/dL (ref 8.4–10.5)
Chloride: 102 mEq/L (ref 96–112)
Creatinine, Ser: 0.89 mg/dL (ref 0.40–1.20)
GFR: 76.93 mL/min (ref >60.00)
Glucose, Bld: 88 mg/dL (ref 70–99)
Potassium: 3.8 mEq/L (ref 3.5–5.1)
Sodium: 140 mEq/L (ref 135–145)

## 2019-03-20 LAB — CBC
HCT: 38.7 % (ref 36.0–46.0)
Hemoglobin: 12 g/dL (ref 12.0–15.0)
MCHC: 31.1 g/dL (ref 30.0–36.0)
MCV: 75 fl — ABNORMAL LOW (ref 78.0–100.0)
Platelets: 293 10*3/uL (ref 150.0–400.0)
RBC: 5.16 Mil/uL — ABNORMAL HIGH (ref 3.87–5.11)
RDW: 14.2 % (ref 11.5–15.5)
WBC: 6.7 10*3/uL (ref 4.0–10.5)

## 2019-03-20 MED ORDER — DICLOFENAC SODIUM 1 % TD GEL: 4.0000 g | Freq: Four times a day (QID) | TRANSDERMAL | 1 refills | Status: DC

## 2019-03-20 NOTE — Progress Notes (Signed)
ACUTE VISIT   HPI:  Chief Complaint  Patient presents with  . Bilateral leg cramping    started over 1 year ago, getting worse  . Bilateral foot swelling    [name] is a 63 y.o. female, who is here today complaining of bilateral foot edema and LE cramps.  She has had LE pain ,cramps, for over a year. She followed with ortho,who recommended Gabapentin but did not help. No recent trauma.  Negative for associated back pain,numbness, tingling,saddle anesthesia,or erythema. Cymbalta,zanaflex,and Naproxen did not help.  "The only medication that" helps in Ibuprofen 800 mg. She has taken her husband Ibuprofen Sundays when she goes to church because prolong sitting.  Negative for CP,dyspnea,diaphoresis,or palpitations.  She also wonders if her K+ is low and maybe causing cramps. Pain is sharp,severe,and intermittent. Exacerbated by stetching legs with ankle extension. Pain eventually goes away after walking for a few minutes.  Feet edema exacerbated by prolonged walking or standing. Alleviated by elevated. It is not present in the morning when she first gets up. She has not noted gross hematuria,decreased urine output,or foam in urine. No orthopnea or PND.  HTN,she is on Losartan 100 mg and HCTZ 25 mg daily. She is not checking BP at home.  Lab Results  Component Value Date   CREATININE 0.98 07/07/2018   BUN 16 07/07/2018   NA 141 07/07/2018   K 3.4 (L) 07/07/2018   CL 105 07/07/2018   CO2 28 07/07/2018   Ibuprofen was discontinued because recurrent GI bleed and iron def anemia. She is on Fe Sulfate 325 mg daily.  She has not had rectal bleed in months. Constipation has improved,she takes Miralax as needed,sometimes it causes diarrhea.  Denies abdominal pain, nausea, vomiting, changes in bowel habits, or melena.   Lab Results  Component Value Date   WBC 7.3 07/07/2018   HGB 9.6 (L) 07/07/2018   HCT 30.6 (L) 07/07/2018   MCV 75.7 (L)  07/07/2018   PLT 334.0 07/07/2018    Review of Systems  Constitutional: Negative for activity change, appetite change, fatigue, fever and unexpected weight change.  HENT: Negative for mouth sores, nosebleeds, sore throat and trouble swallowing.   Eyes: Negative for redness and visual disturbance.  Respiratory: Negative for cough and wheezing.   Gastrointestinal:       Negative for changes in bowel habits.  Endocrine: Negative for cold intolerance and heat intolerance.  Genitourinary: Negative for decreased urine volume, dysuria and hematuria.  Musculoskeletal: Negative for gait problem.  Neurological: Negative for syncope, weakness, numbness and headaches.  Psychiatric/Behavioral: Negative for confusion. The patient is nervous/anxious.   Rest see pertinent positives and negatives per HPI.   Current Outpatient Medications on File Prior to Visit  Medication Sig Dispense Refill  . acetaminophen (TYLENOL) 500 MG tablet Take 1,000 mg by mouth daily as needed for moderate pain (pain).     Marland Kitchen esomeprazole (NEXIUM) 40 MG capsule Take 1 capsule (40 mg total) by mouth daily. 90 capsule 3  . ferrous sulfate 325 (65 FE) MG tablet Take 1 tablet (325 mg total) by mouth daily with breakfast. 90 tablet 3  . hydrochlorothiazide (HYDRODIURIL) 25 MG tablet Take 1 tablet (25 mg total) by mouth daily. 90 tablet 1  . losartan (COZAAR) 100 MG tablet Take 1 tablet (100 mg total) by mouth daily. 90 tablet 1  . polycarbophil (FIBERCON) 625 MG tablet Take 1 tablet (625 mg total) by mouth 2 (two) times daily. 180 tablet  2  . polyethylene glycol (MIRALAX / GLYCOLAX) packet Take 17 g by mouth daily. 14 each 3  . pravastatin (PRAVACHOL) 20 MG tablet TAKE 1 TABLET BY MOUTH EVERY DAY 90 tablet 0   No current facility-administered medications on file prior to visit.      Past Medical History:  Diagnosis Date  . Arthritis   . Constipation    on stool softener  . GERD (gastroesophageal reflux disease)   .  Hyperlipidemia   . Hypertension   . Trouble swallowing 2013   following lap band   No Known Allergies  Social History   Socioeconomic History  . Marital status: Married    Spouse name: Not on file  . Number of children: 1  . Years of education: Not on file  . Highest education level: Not on file  Occupational History  . Not on file  Social Needs  . Financial resource strain: Not on file  . Food insecurity    Worry: Not on file    Inability: Not on file  . Transportation needs    Medical: Not on file    Non-medical: Not on file  Tobacco Use  . Smoking status: Former Smoker    Types: Cigarettes    Quit date: 10/02/1972    Years since quitting: 46.5  . Smokeless tobacco: Never Used  Substance and Sexual Activity  . Alcohol use: No  . Drug use: No  . Sexual activity: Never  Lifestyle  . Physical activity    Days per week: Not on file    Minutes per session: Not on file  . Stress: Not on file  Relationships  . Social Herbalist on phone: Not on file    Gets together: Not on file    Attends religious service: Not on file    Active member of club or organization: Not on file    Attends meetings of clubs or organizations: Not on file    Relationship status: Not on file  Other Topics Concern  . Not on file  Social History Narrative  . Not on file    Vitals:   03/20/19 0937  BP: 130/90  Pulse: 86  Resp: 12  Temp: 97.8 F (36.6 C)  SpO2: 98%   Body mass index is 47.23 kg/m.   Physical Exam  Nursing note and vitals reviewed. Constitutional: She is oriented to person, place, and time. She appears well-developed. No distress.  HENT:  Head: Normocephalic and atraumatic.  Mouth/Throat: Oropharynx is clear and moist and mucous membranes are normal.  Eyes: Pupils are equal, round, and reactive to light. Conjunctivae are normal.  Cardiovascular: Normal rate and regular rhythm.  No murmur heard. Pulses:      Dorsalis pedis pulses are 2+ on the right  side and 2+ on the left side.  LE varicose veins,mild. Negative for calves pain upon dorsal flexion. No erythema of calves major difference in diameter.   Respiratory: Effort normal and breath sounds normal. No respiratory distress.  GI: Soft. She exhibits no mass. There is no hepatomegaly. There is no abdominal tenderness.  Musculoskeletal:        General: No edema.     Right lower leg: She exhibits no tenderness.     Left lower leg: She exhibits no tenderness.  Lymphadenopathy:    She has no cervical adenopathy.  Neurological: She is alert and oriented to person, place, and time. She has normal strength. No cranial nerve deficit. Gait normal.  Skin: Skin is warm. No rash noted. No erythema.  Psychiatric: Her mood appears anxious.  Well groomed, good eye contact.    ASSESSMENT AND PLAN:  [name] was seen today for bilateral leg cramping and bilateral foot swelling.  Diagnoses and all orders for this visit:  Lab Results  Component Value Date   WBC 6.7 03/20/2019   HGB 12.0 03/20/2019   HCT 38.7 03/20/2019   MCV 75.0 (L) 03/20/2019   PLT 293.0 03/20/2019   Lab Results  Component Value Date   CREATININE 0.89 03/20/2019   BUN 17 03/20/2019   NA 140 03/20/2019   K 3.8 03/20/2019   CL 102 03/20/2019   CO2 32 03/20/2019    Hypertension, essential, benign DBP slightly elevated. Side effects of chronic NSAID's use. Recommend monitoring BP at home regularly. Continue low salt diet. For now continue HCTZ 25 mg daily and Losartan 100 mg daily  -     Basic metabolic panel  Iron deficiency anemia, unspecified iron deficiency anemia type No changes in current management, will follow cbc done today and will give further recommendations accordingly.  -     CBC  Hypokalemia K+ rich diet recommended for now. HCTZ could aggravated problem.  -     Basic metabolic panel  Pain in both lower extremities No edema noted at this time. We discussed possible etiologies, it  does not seem related with a serious process. LE elevation and compression stockings recommended. States that this has been recommended before but she has not tolerated well.    Return in about 4 months (around 07/20/2019) for HTN,cramps.  -[name] was advised to seek immediate medical attention if sudden worsening symptoms or to follow if they persist or if new concerns arise.       Cacie Gaskins G. [place], MD  [facility]. [place] office.

## 2019-03-20 NOTE — Patient Instructions (Signed)
A few things to remember from today's visit:   Hypertension, essential, benign - Plan: Basic metabolic panel  Iron deficiency anemia, unspecified iron deficiency anemia type - Plan: CBC  Hypokalemia - Plan: Basic metabolic panel  Pain in both lower extremities - Plan: diclofenac sodium (VOLTAREN) 1 % GEL   Leg Cramps Leg cramps occur when one or more muscles tighten and you have no control over this tightening (involuntary muscle contraction). Muscle cramps can develop in any muscle, but the most common place is in the calf muscles of the leg. Those cramps can occur during exercise or when you are at rest. Leg cramps are painful, and they may last for a few seconds to a few minutes. Cramps may return several times before they finally stop. Usually, leg cramps are not caused by a serious medical problem. In many cases, the cause is not known. Some common causes include:  Excessive physical effort (overexertion), such as during intense exercise.  Overuse from repetitive motions, or doing the same thing over and over.  Staying in a certain position for a long period of time.  Improper preparation, form, or technique while performing a sport or an activity.  Dehydration.  Injury.  Side effects of certain medicines.  Abnormally low levels of minerals in your blood (electrolytes), especially potassium and calcium. This could result from: ? Pregnancy. ? Taking diuretic medicines. Follow these instructions at home: Eating and drinking  Drink enough fluid to keep your urine pale yellow. Staying hydrated may help prevent cramps.  Eat a healthy diet that includes plenty of nutrients to help your muscles function. A healthy diet includes fruits and vegetables, lean protein, whole grains, and low-fat or nonfat dairy products. Managing pain, stiffness, and swelling      Try massaging, stretching, and relaxing the affected muscle. Do this for several minutes at a time.  If directed,  put ice on areas that are sore or painful after a cramp: ? Put ice in a plastic bag. ? Place a towel between your skin and the bag. ? Leave the ice on for 20 minutes, 2-3 times a day.  If directed, apply heat to muscles that are tense or tight. Do this before you exercise, or as often as told by your health care provider. Use the heat source that your health care provider recommends, such as a moist heat pack or a heating pad. ? Place a towel between your skin and the heat source. ? Leave the heat on for 20-30 minutes. ? Remove the heat if your skin turns bright red. This is especially important if you are unable to feel pain, heat, or cold. You may have a greater risk of getting burned.  Try taking hot showers or baths to help relax tight muscles. General instructions  If you are having frequent leg cramps, avoid intense exercise for several days.  Take over-the-counter and prescription medicines only as told by your health care provider.  Keep all follow-up visits as told by your health care provider. This is important. Contact a health care provider if:  Your leg cramps get more severe or more frequent, or they do not improve over time.  Your foot becomes cold, numb, or blue. Summary  Muscle cramps can develop in any muscle, but the most common place is in the calf muscles of the leg.  Leg cramps are painful, and they may last for a few seconds to a few minutes.  Usually, leg cramps are not caused by a serious  medical problem. Often, the cause is not known.  Stay hydrated and take over-the-counter and prescription medicines only as told by your health care provider. This information is not intended to replace advice given to you by your health care provider. Make sure you discuss any questions you have with your health care provider. Document Released: 08/23/2004 Document Revised: 06/28/2017 Document Reviewed: 04/25/2017 Elsevier Patient Education  2020 [employer].  Please  be sure medication list is accurate. If a new problem present, please set up appointment sooner than planned today.

## 2019-03-23 ENCOUNTER — Encounter: Payer: Self-pay | Admitting: Family Medicine

## 2019-03-30 ENCOUNTER — Encounter: Payer: Self-pay | Admitting: *Deleted

## 2019-07-20 ENCOUNTER — Other Ambulatory Visit: Payer: Self-pay | Admitting: Family Medicine

## 2019-07-20 DIAGNOSIS — M79604 Pain in right leg: Secondary | ICD-10-CM

## 2019-07-20 DIAGNOSIS — M79605 Pain in left leg: Secondary | ICD-10-CM

## 2019-08-26 ENCOUNTER — Encounter: Payer: Self-pay | Admitting: Family Medicine

## 2019-08-26 ENCOUNTER — Ambulatory Visit (INDEPENDENT_AMBULATORY_CARE_PROVIDER_SITE_OTHER): Payer: Medicare HMO | Admitting: Family Medicine

## 2019-08-26 ENCOUNTER — Other Ambulatory Visit: Payer: Self-pay

## 2019-08-26 VITALS — BP 138/80 | HR 82 | Temp 96.6°F | Resp 16 | Ht 62.0 in | Wt 255.0 lb

## 2019-08-26 DIAGNOSIS — Z780 Asymptomatic menopausal state: Secondary | ICD-10-CM | POA: Diagnosis not present

## 2019-08-26 DIAGNOSIS — Z1239 Encounter for other screening for malignant neoplasm of breast: Secondary | ICD-10-CM | POA: Diagnosis not present

## 2019-08-26 DIAGNOSIS — Z23 Encounter for immunization: Secondary | ICD-10-CM | POA: Diagnosis not present

## 2019-08-26 DIAGNOSIS — E876 Hypokalemia: Secondary | ICD-10-CM

## 2019-08-26 DIAGNOSIS — D509 Iron deficiency anemia, unspecified: Secondary | ICD-10-CM

## 2019-08-26 DIAGNOSIS — E785 Hyperlipidemia, unspecified: Secondary | ICD-10-CM | POA: Diagnosis not present

## 2019-08-26 DIAGNOSIS — M79605 Pain in left leg: Secondary | ICD-10-CM

## 2019-08-26 DIAGNOSIS — Z0000 Encounter for general adult medical examination without abnormal findings: Secondary | ICD-10-CM | POA: Diagnosis not present

## 2019-08-26 DIAGNOSIS — M79604 Pain in right leg: Secondary | ICD-10-CM | POA: Diagnosis not present

## 2019-08-26 DIAGNOSIS — E559 Vitamin D deficiency, unspecified: Secondary | ICD-10-CM | POA: Diagnosis not present

## 2019-08-26 DIAGNOSIS — I10 Essential (primary) hypertension: Secondary | ICD-10-CM | POA: Diagnosis not present

## 2019-08-26 DIAGNOSIS — Z6841 Body Mass Index (BMI) 40.0 and over, adult: Secondary | ICD-10-CM

## 2019-08-26 DIAGNOSIS — M25541 Pain in joints of right hand: Secondary | ICD-10-CM

## 2019-08-26 LAB — VITAMIN D 25 HYDROXY (VIT D DEFICIENCY, FRACTURES): VITD: <7 ng/mL — ABNORMAL LOW (ref 30.00–100.00)

## 2019-08-26 LAB — COMPREHENSIVE METABOLIC PANEL
ALT: 12 U/L (ref 0–35)
AST: 14 U/L (ref 0–37)
Albumin: 4.4 g/dL (ref 3.5–5.2)
Alkaline Phosphatase: 99 U/L (ref 39–117)
BUN: 12 mg/dL (ref 6–23)
CO2: 32 mEq/L (ref 19–32)
Calcium: 9.1 mg/dL (ref 8.4–10.5)
Chloride: 104 mEq/L (ref 96–112)
Creatinine, Ser: 0.85 mg/dL (ref 0.40–1.20)
GFR: 81.01 mL/min (ref >60.00)
Glucose, Bld: 105 mg/dL — ABNORMAL HIGH (ref 70–99)
Potassium: 3.6 mEq/L (ref 3.5–5.1)
Sodium: 141 mEq/L (ref 135–145)
Total Bilirubin: 0.4 mg/dL (ref 0.2–1.2)
Total Protein: 7.1 g/dL (ref 6.0–8.3)

## 2019-08-26 LAB — CBC WITH DIFFERENTIAL/PLATELET
Basophils Absolute: 0 10*3/uL (ref 0.0–0.1)
Basophils Relative: 0.5 % (ref 0.0–3.0)
Eosinophils Absolute: 0.2 10*3/uL (ref 0.0–0.7)
Eosinophils Relative: 3.5 % (ref 0.0–5.0)
HCT: 40.1 % (ref 36.0–46.0)
Hemoglobin: 12.4 g/dL (ref 12.0–15.0)
Lymphocytes Relative: 34.3 % (ref 12.0–46.0)
Lymphs Abs: 2 10*3/uL (ref 0.7–4.0)
MCHC: 30.9 g/dL (ref 30.0–36.0)
MCV: 74.4 fl — ABNORMAL LOW (ref 78.0–100.0)
Monocytes Absolute: 0.4 10*3/uL (ref 0.1–1.0)
Monocytes Relative: 6.5 % (ref 3.0–12.0)
Neutro Abs: 3.1 10*3/uL (ref 1.4–7.7)
Neutrophils Relative %: 55.2 % (ref 43.0–77.0)
Platelets: 291 10*3/uL (ref 150.0–400.0)
RBC: 5.39 Mil/uL — ABNORMAL HIGH (ref 3.87–5.11)
RDW: 14.5 % (ref 11.5–15.5)
WBC: 5.7 10*3/uL (ref 4.0–10.5)

## 2019-08-26 LAB — LIPID PANEL
Cholesterol: 196 mg/dL (ref 0–200)
HDL: 44.5 mg/dL (ref >39.00)
LDL Cholesterol: 134 mg/dL — ABNORMAL HIGH (ref 0–99)
NonHDL: 151.66
Total CHOL/HDL Ratio: 4
Triglycerides: 89 mg/dL (ref 0.0–149.0)
VLDL: 17.8 mg/dL (ref 0.0–40.0)

## 2019-08-26 LAB — FERRITIN: Ferritin: 49.4 ng/mL (ref 10.0–291.0)

## 2019-08-26 MED ORDER — DICLOFENAC SODIUM 1 % EX GEL: CUTANEOUS | 3 refills | Status: DC

## 2019-08-26 NOTE — Assessment & Plan Note (Signed)
Continue vit D 800 U (in multivit). Further recommendations will be given according to 25 )H vit D results.

## 2019-08-26 NOTE — Assessment & Plan Note (Signed)
Problem is better controlled with topical Voltaren, so no changes.

## 2019-08-26 NOTE — Progress Notes (Signed)
HPI:   [name] is a 64 y.o. female, who is here today for her routine physical.  Last CPE: > a year ago.  Regular exercise 3 or more time per week: Walking in place,tries to do it daily. Following a healthy diet: Not consistently. She lives with her husband.  Chronic medical problems: HTN,HLD,OA,obesity,GI bleed, and GERD among some.  Pap smear: S/P hysterectomy. Hx of abnormal pap smears: Negative.  Immunization History  Administered Date(s) Administered  . Influenza, High Dose Seasonal PF 04/29/2019  . Influenza,inj,Quad PF,6+ Mos 05/23/2015, 05/03/2016, 04/23/2018  . PPD Test 05/14/2018  . Pneumococcal Conjugate-13 08/04/2015  . Pneumococcal Polysaccharide-23 08/26/2019  . Tdap 05/23/2015   Mammogram: She had it in [place] in 2007. Colonoscopy: 01/08/18. DEXA: Not done before. Hep C screening: 10/2015 NR.  She is concerned about K+ and anemia. K+ has been low before.  Hx if iron def anemia due to GI bleed. She has not seen blood in stool or melena in a few months. She would like this to be repeated. Negative for pica.  Lab Results  Component Value Date   WBC 6.7 03/20/2019   HGB 12.0 03/20/2019   HCT 38.7 03/20/2019   MCV 75.0 (L) 03/20/2019   PLT 293.0 03/20/2019   She is on iron supplementations, she wonders if she needs to continue taking it.  RLE pain: Diclofenac gel has helped. No edema or erythema. This is a chronic problem.  Still having right foot cramps.  HLD:  Taking Pravastatin 20 mg daily. Tolerating medication well.  Lab Results  Component Value Date   CHOL 197 09/04/2017   HDL 38.90 (L) 09/04/2017   LDLCALC 134 (H) 09/04/2017   TRIG 122.0 09/04/2017   CHOLHDL 5 09/04/2017   HTN:  She is on Losartan 100 mg daily and HCTZ 25 mg daily. Denies severe/frequent headache, visual changes, chest pain, dyspnea, palpitation, or worsening edema.  Lab Results  Component Value Date   CREATININE 0.89 03/20/2019   BUN 17  03/20/2019   NA 140 03/20/2019   K 3.8 03/20/2019   CL 102 03/20/2019   CO2 32 03/20/2019   She also mentions that for a while she has had uvular edema, intermittent. She is not sire about exacerbating or alleviating factors. Benadryl 25 mg helps.  No associated stridor,cough,or wheezing. Last episode a months ago.  IP joint pain, right hand. She is right handed. No erythema,occasionally edema. This has been going on for a while. Exacerbated by repetitive manual activities.  Vit D deficiency: She is on vit D 800 U daily.   Review of Systems  Constitutional: Negative for appetite change, fatigue and fever.  HENT: Negative for hearing loss, mouth sores, sore throat and trouble swallowing.   Eyes: Negative for pain and redness.  Cardiovascular: Negative for chest pain and leg swelling.  Gastrointestinal: Negative for abdominal pain, nausea and vomiting.       No changes in bowel habits.  Endocrine: Negative for cold intolerance, heat intolerance, polydipsia, polyphagia and polyuria.  Genitourinary: Negative for decreased urine volume, dysuria, hematuria, vaginal bleeding and vaginal discharge.  Musculoskeletal: Positive for arthralgias and myalgias. Negative for gait problem.  Skin: Negative for color change and rash.  Allergic/Immunologic: Positive for environmental allergies.  Neurological: Negative for syncope and facial asymmetry.  Psychiatric/Behavioral: Negative for confusion. The patient is not nervous/anxious.   All other systems reviewed and are negative.  Current Outpatient Medications on File Prior to Visit  Medication Sig Dispense  Refill  . acetaminophen (TYLENOL) 500 MG tablet Take 1,000 mg by mouth daily as needed for moderate pain (pain).     Marland Kitchen esomeprazole (NEXIUM) 40 MG capsule Take 1 capsule (40 mg total) by mouth daily. 90 capsule 3  . ferrous sulfate 325 (65 FE) MG tablet Take 1 tablet (325 mg total) by mouth daily with breakfast. 90 tablet 3  .  hydrochlorothiazide (HYDRODIURIL) 25 MG tablet Take 1 tablet (25 mg total) by mouth daily. 90 tablet 1  . losartan (COZAAR) 100 MG tablet Take 1 tablet (100 mg total) by mouth daily. 90 tablet 1  . polycarbophil (FIBERCON) 625 MG tablet Take 1 tablet (625 mg total) by mouth 2 (two) times daily. 180 tablet 2  . polyethylene glycol (MIRALAX / GLYCOLAX) packet Take 17 g by mouth daily. 14 each 3   No current facility-administered medications on file prior to visit.     Past Medical History:  Diagnosis Date  . Arthritis   . Constipation    on stool softener  . GERD (gastroesophageal reflux disease)   . Hyperlipidemia   . Hypertension   . Trouble swallowing 2013   following lap band    Past Surgical History:  Procedure Laterality Date  . ABDOMINAL HYSTERECTOMY    . CHOLECYSTECTOMY    . COLONOSCOPY N/A 01/08/2018   Procedure: COLONOSCOPY;  Surgeon: Yetta Flock, MD;  Location: [facility];  Service: Gastroenterology;  Laterality: N/A;  . LAPAROSCOPIC GASTRIC BANDING    . LAPAROSCOPIC REPAIR AND REMOVAL OF GASTRIC BAND  2013-2014    No Known Allergies  Family History  Problem Relation Age of Onset  . Hypertension Mother   . Stroke Mother   . Diabetes Father   . Alcohol abuse Father   . Kidney disease Father   . Colon polyps Brother   . Cancer Brother   . Alcohol abuse Brother     Social History   Socioeconomic History  . Marital status: Married    Spouse name: Not on file  . Number of children: 1  . Years of education: Not on file  . Highest education level: Not on file  Occupational History  . Not on file  Tobacco Use  . Smoking status: Former Smoker    Types: Cigarettes    Quit date: 10/02/1972    Years since quitting: 46.9  . Smokeless tobacco: Never Used  Substance and Sexual Activity  . Alcohol use: No  . Drug use: No  . Sexual activity: Never  Other Topics Concern  . Not on file  Social History Narrative  . Not on file   Social Determinants  of Health   Financial Resource Strain:   . Difficulty of Paying Living Expenses: Not on file  Food Insecurity:   . Worried About Charity fundraiser in the Last Year: Not on file  . Ran Out of Food in the Last Year: Not on file  Transportation Needs:   . Lack of Transportation (Medical): Not on file  . Lack of Transportation (Non-Medical): Not on file  Physical Activity:   . Days of Exercise per Week: Not on file  . Minutes of Exercise per Session: Not on file  Stress:   . Feeling of Stress : Not on file  Social Connections:   . Frequency of Communication with Friends and Family: Not on file  . Frequency of Social Gatherings with Friends and Family: Not on file  . Attends Religious Services: Not on file  .  Active Member of Clubs or Organizations: Not on file  . Attends Archivist Meetings: Not on file  . Marital Status: Not on file    Vitals:   08/26/19 0927  BP: 138/80  Pulse: 82  Resp: 16  Temp: (!) 96.6 F (35.9 C)  SpO2: 98%   Body mass index is 46.64 kg/m.  Wt Readings from Last 3 Encounters:  08/26/19 255 lb (115.7 kg)  03/20/19 258 lb 4 oz (117.1 kg)  07/07/18 241 lb 9.6 oz (109.6 kg)    Physical Exam  Nursing note and vitals reviewed. Constitutional: She is oriented to person, place, and time. She appears well-developed. No distress.  HENT:  Head: Normocephalic and atraumatic.  Right Ear: Hearing, tympanic membrane, external ear and ear canal normal.  Left Ear: Hearing, tympanic membrane, external ear and ear canal normal.  Mouth/Throat: Uvula is midline, oropharynx is clear and moist and mucous membranes are normal.  Eyes: Pupils are equal, round, and reactive to light. Conjunctivae and EOM are normal.  Neck: No tracheal deviation present. No thyromegaly present.  Cardiovascular: Normal rate and regular rhythm.  No murmur heard. Pulses:      Dorsalis pedis pulses are 2+ on the right side and 2+ on the left side.  Respiratory: Effort normal  and breath sounds normal. No respiratory distress. No breast swelling or tenderness.  GI: Soft. She exhibits no mass. There is no hepatomegaly. There is no abdominal tenderness.  Genitourinary:    Genitourinary Comments: Breast: No masses,skin abnormalities,or nipple discharge bilateral.   Musculoskeletal:        General: Edema (Trace pitting LE edema,bilateral.) present.     Comments: No signs of synovitis appreciated. Right hand: 3rd finger DIP and PIP joints pain with palpation. No significant limitation of ROM.   Lymphadenopathy:    She has no cervical adenopathy.    She has no axillary adenopathy.  Neurological: She is alert and oriented to person, place, and time. She has normal strength. No cranial nerve deficit. Coordination and gait normal.  Reflex Scores:      Bicep reflexes are 2+ on the right side and 2+ on the left side.      Patellar reflexes are 2+ on the right side and 2+ on the left side. Skin: Skin is warm. No rash noted. No erythema.  Psychiatric: Her mood appears anxious.  Well groomed, good eye contact.    ASSESSMENT AND PLAN:  Ms. Sibley Gottschall was here today annual physical examination.  Orders Placed This Encounter  Procedures  . DEXAScan  . Mammogram Digital Screening  . Pneumococcal polysaccharide vaccine 23-valent greater than or equal to 2yo subcutaneous/IM  . Ferritin  . VITAMIN D 25 Hydroxy (Vit-D Deficiency, Fractures)  . Lipid panel  . CBC with Differential  . Comprehensive metabolic panel   Lab Results  Component Value Date   CHOL 196 08/26/2019   HDL 44.50 08/26/2019   LDLCALC 134 (H) 08/26/2019   TRIG 89.0 08/26/2019   CHOLHDL 4 08/26/2019   Lab Results  Component Value Date   CREATININE 0.85 08/26/2019   BUN 12 08/26/2019   NA 141 08/26/2019   K 3.6 08/26/2019   CL 104 08/26/2019   CO2 32 08/26/2019   Lab Results  Component Value Date   ALT 12 08/26/2019   AST 14 08/26/2019   ALKPHOS 99 08/26/2019   BILITOT 0.4  08/26/2019   Lab Results  Component Value Date   WBC 5.7 08/26/2019   HGB  12.4 08/26/2019   HCT 40.1 08/26/2019   MCV 74.4 (L) 08/26/2019   PLT 291.0 08/26/2019    Routine general medical examination at a health care facility We discussed the importance of regular physical activity and healthy diet for prevention of chronic illness and/or complications. Preventive guidelines reviewed. Vaccination updated.  Ca++ and vit D supplementation to continue. Next CPE in a year.  Hyperlipidemia, unspecified hyperlipidemia type Continue Pravastatin 20 mg daily. We will adjust dose if needed. Low fat diet also recommended.  -     Lipid panel -     Comprehensive metabolic panel  Asymptomatic postmenopausal estrogen deficiency -     DEXAScan; Future  Encounter for screening for malignant neoplasm of breast, unspecified screening modality -     Mammogram Digital Screening; Future  Arthralgia of right hand Most likely OA. Educated about Dx and prognosis. She can continue monitoring for changes. Tylenol 500 mg 3-4 times per day if needed.  Need for pneumococcal vaccination -     Pneumococcal polysaccharide vaccine 23-valent greater than or equal to 2yo subcutaneous/IM  Morbid obesity with BMI of 40.0-44.9, adult ([place]) We discussed benefits of wt loss as well as adverse effects of obesity. Consistency with healthy diet and physical activity recommended.  Hypertension, essential, benign BP adequately controlled. No changes in current management. Continue low salt diet.  Hypokalemia Last K+ was in normal range. Further recommendations will be given according to BMP results. No changes in HCTZ for now.  Leg pain Problem is better controlled with topical Voltaren, so no changes.  Iron deficiency anemia No changes in current management, will follow CBC/ferritin done today and will give further recommendations accordingly.   Vitamin D deficiency, unspecified Continue vit D 800  U (in multivit). Further recommendations will be given according to 25 )H vit D results.    In regard to uvular edema, today exam is normal. Instructed about warning signs and to call to office when it happens.   Return in 6 months (on 02/23/2020).   Jennifer Criado G. [place], MD  [facility]. [place] office.

## 2019-08-26 NOTE — Assessment & Plan Note (Addendum)
Last K+ was in normal range. Further recommendations will be given according to BMP results. No changes in HCTZ for now.

## 2019-08-26 NOTE — Assessment & Plan Note (Signed)
No changes in current management, will follow CBC/ferritin done today and will give further recommendations accordingly.

## 2019-08-26 NOTE — Patient Instructions (Signed)
Today you have you routine preventive visit.  Routine general medical examination at a health care facility  Hyperlipidemia, unspecified hyperlipidemia type - Plan: Lipid panel, Comprehensive metabolic panel  Hypertension, essential, benign - Plan: Comprehensive metabolic panel  Pain in both lower extremities  Hypokalemia - Plan: Comprehensive metabolic panel  Iron deficiency anemia, unspecified iron deficiency anemia type - Plan: Ferritin, CBC with Differential  Vitamin D deficiency, unspecified - Plan: VITAMIN D 25 Hydroxy (Vit-D Deficiency, Fractures)  Asymptomatic postmenopausal estrogen deficiency - Plan: DEXAScan  Encounter for screening for malignant neoplasm of breast, unspecified screening modality - Plan: Mammogram Digital Screening  Non-articular pain of right hand   Please be sure medication list is accurate. If a new problem present, please set up appointment sooner than planned today.  At least 150 minutes of moderate exercise per week, daily brisk walking for 15-30 min is a good exercise option. Healthy diet low in saturated (animal) fats and sweets and consisting of fresh fruits and vegetables, lean meats such as fish and white chicken and whole grains.  These are some of recommendations for screening depending of age and risk factors:   - Vaccines:  Tdap vaccine every 10 years.  Shingles vaccine recommended at age 94, could be given after 66 years of age but not sure about insurance coverage.   Pneumonia vaccines:  Pneumovax at 74. Sometimes Pneumovax is giving earlier if history of smoking, lung disease,diabetes,kidney disease among some.  Screening for diabetes at age 47 and every 3 years.  Cervical cancer prevention:  N/A   -Breast cancer: Mammogram: There is disagreement between experts about when to start screening in low risk asymptomatic female but recent recommendations are to start screening at 47 and not later than 66 years old , every 1-2  years and after 66 yo q 2 years. Screening is recommended until 66 years old but some women can continue screening depending of healthy issues.   Colon cancer screening: starts at 66 years old until 66 years old.  Cholesterol disorder screening: N/A  Also recommended:  1. Dental visit- Brush and floss your teeth twice daily; visit your dentist twice a year. 2. Eye doctor- Get an eye exam at least every 2 years. 3. Helmet use- Always wear a helmet when riding a bicycle, motorcycle, rollerblading or skateboarding. 4. Safe sex- If you may be exposed to sexually transmitted infections, use a condom. 5. Seat belts- Seat belts can save your live; always wear one. 6. Smoke/Carbon Monoxide detectors- These detectors need to be installed on the appropriate level of your home. Replace batteries at least once a year. 7. Skin cancer- When out in the sun please cover up and use sunscreen 15 SPF or higher. 8. Violence- If anyone is threatening or hurting you, please tell your healthcare provider.  9. Drink alcohol in moderation- Limit alcohol intake to one drink or less per day. Never drink and drive.

## 2019-08-26 NOTE — Assessment & Plan Note (Signed)
BP adequately controlled. No changes in current management. Continue low salt diet. 

## 2019-08-27 MED ORDER — PRAVASTATIN SODIUM 20 MG PO TABS: 20.0000 mg | ORAL_TABLET | Freq: Every day | ORAL | 3 refills | Status: DC

## 2019-08-27 MED ORDER — VITAMIN D (ERGOCALCIFEROL) 1.25 MG (50000 UNIT) PO CAPS: ORAL_CAPSULE | ORAL | 1 refills | Status: DC

## 2019-08-28 ENCOUNTER — Telehealth: Payer: Self-pay | Admitting: Family Medicine

## 2019-08-28 DIAGNOSIS — K219 Gastro-esophageal reflux disease without esophagitis: Secondary | ICD-10-CM

## 2019-08-28 DIAGNOSIS — E785 Hyperlipidemia, unspecified: Secondary | ICD-10-CM

## 2019-08-28 DIAGNOSIS — D509 Iron deficiency anemia, unspecified: Secondary | ICD-10-CM

## 2019-08-28 DIAGNOSIS — I10 Essential (primary) hypertension: Secondary | ICD-10-CM

## 2019-08-28 MED ORDER — ACETAMINOPHEN 500 MG PO TABS: 1000.0000 mg | ORAL_TABLET | Freq: Every day | ORAL | 3 refills | Status: DC | PRN

## 2019-08-28 MED ORDER — HYDROCHLOROTHIAZIDE 25 MG PO TABS: 25.0000 mg | ORAL_TABLET | Freq: Every day | ORAL | 3 refills | Status: DC

## 2019-08-28 MED ORDER — LOSARTAN POTASSIUM 100 MG PO TABS: 100.0000 mg | ORAL_TABLET | Freq: Every day | ORAL | 3 refills | Status: DC

## 2019-08-28 MED ORDER — ESOMEPRAZOLE MAGNESIUM 40 MG PO CPDR: 40.0000 mg | DELAYED_RELEASE_CAPSULE | Freq: Every day | ORAL | 3 refills | Status: DC

## 2019-08-28 MED ORDER — FERROUS SULFATE 325 (65 FE) MG PO TABS: 325.0000 mg | ORAL_TABLET | Freq: Every day | ORAL | 3 refills | Status: DC

## 2019-08-28 MED ORDER — PRAVASTATIN SODIUM 20 MG PO TABS: 20.0000 mg | ORAL_TABLET | Freq: Every day | ORAL | 3 refills | Status: DC

## 2019-08-28 NOTE — Telephone Encounter (Signed)
Rx's sent, left pt a voicemail letting her know.

## 2019-08-28 NOTE — Telephone Encounter (Signed)
Medication refill: Losartan Ferrous Sulfate Hydrochlorothiazide Nexium Pravastatin Tylenol 500 MG  Pharmacy: [facility], [place], [place] [address]

## 2019-09-01 ENCOUNTER — Other Ambulatory Visit: Payer: Self-pay

## 2019-09-01 DIAGNOSIS — E785 Hyperlipidemia, unspecified: Secondary | ICD-10-CM

## 2019-09-01 DIAGNOSIS — E559 Vitamin D deficiency, unspecified: Secondary | ICD-10-CM

## 2019-09-01 MED ORDER — VITAMIN D (ERGOCALCIFEROL) 1.25 MG (50000 UNIT) PO CAPS: ORAL_CAPSULE | ORAL | 1 refills | Status: DC

## 2019-09-01 MED ORDER — PRAVASTATIN SODIUM 20 MG PO TABS: 20.0000 mg | ORAL_TABLET | Freq: Every day | ORAL | 3 refills | Status: DC

## 2019-09-07 ENCOUNTER — Ambulatory Visit (INDEPENDENT_AMBULATORY_CARE_PROVIDER_SITE_OTHER)
Admission: RE | Admit: 2019-09-07 | Discharge: 2019-09-07 | Disposition: A | Discharge status: Home / Self Care | Payer: Medicare HMO | Source: Ambulatory Visit | Attending: Family Medicine | Admitting: Family Medicine

## 2019-09-07 ENCOUNTER — Other Ambulatory Visit: Payer: Self-pay

## 2019-09-07 DIAGNOSIS — Z780 Asymptomatic menopausal state: Secondary | ICD-10-CM | POA: Diagnosis not present

## 2019-09-14 ENCOUNTER — Telehealth: Payer: Self-pay | Admitting: Family Medicine

## 2019-09-14 DIAGNOSIS — Z0189 Encounter for other specified special examinations: Secondary | ICD-10-CM

## 2019-09-14 NOTE — Telephone Encounter (Signed)
Pt returning phone call about results. She also wanted to see about an appt for hepatitis b antibodies test.   Patient Phone: [phone]

## 2019-09-14 NOTE — Telephone Encounter (Signed)
Order placed

## 2019-09-14 NOTE — Telephone Encounter (Signed)
Lab can be ordered but I am not sure if her health insurance will cover test. Thanks, BJ

## 2019-09-14 NOTE — Telephone Encounter (Signed)
I spoke with pt. We went over her lab results & she verbalized understanding. Pt is requesting a hepatitis B antibody test to be drawn - made pt a lab appt for next Tuesday. Okay to order with dx of pt requested?

## 2019-09-21 ENCOUNTER — Other Ambulatory Visit: Payer: Self-pay

## 2019-09-22 ENCOUNTER — Other Ambulatory Visit: Payer: Self-pay

## 2019-09-22 ENCOUNTER — Other Ambulatory Visit (INDEPENDENT_AMBULATORY_CARE_PROVIDER_SITE_OTHER): Payer: Medicare HMO

## 2019-09-22 DIAGNOSIS — Z0189 Encounter for other specified special examinations: Secondary | ICD-10-CM | POA: Diagnosis not present

## 2019-09-23 LAB — HEPATITIS B SURFACE ANTIBODY,QUALITATIVE: Hep B S Ab: NONREACTIVE

## 2019-10-07 ENCOUNTER — Other Ambulatory Visit: Payer: Self-pay

## 2019-10-07 ENCOUNTER — Ambulatory Visit
Admission: RE | Admit: 2019-10-07 | Discharge: 2019-10-07 | Disposition: A | Discharge status: Home / Self Care | Payer: Medicare HMO | Source: Ambulatory Visit | Attending: Family Medicine | Admitting: Family Medicine

## 2019-10-07 DIAGNOSIS — Z1239 Encounter for other screening for malignant neoplasm of breast: Secondary | ICD-10-CM

## 2019-10-07 DIAGNOSIS — Z1231 Encounter for screening mammogram for malignant neoplasm of breast: Secondary | ICD-10-CM | POA: Diagnosis not present

## 2019-10-08 ENCOUNTER — Other Ambulatory Visit: Payer: Self-pay | Admitting: Family Medicine

## 2019-10-08 DIAGNOSIS — R928 Other abnormal and inconclusive findings on diagnostic imaging of breast: Secondary | ICD-10-CM

## 2019-10-12 ENCOUNTER — Other Ambulatory Visit: Payer: Self-pay

## 2019-10-13 ENCOUNTER — Ambulatory Visit (INDEPENDENT_AMBULATORY_CARE_PROVIDER_SITE_OTHER): Payer: Medicare HMO

## 2019-10-13 ENCOUNTER — Other Ambulatory Visit: Payer: Self-pay

## 2019-10-13 ENCOUNTER — Encounter (INDEPENDENT_AMBULATORY_CARE_PROVIDER_SITE_OTHER): Payer: Self-pay

## 2019-10-13 DIAGNOSIS — Z23 Encounter for immunization: Secondary | ICD-10-CM

## 2019-10-13 NOTE — Progress Notes (Signed)
Patient came into the office to receive her 1st Hepatitis B vaccine out of 3. Pt received the vaccine in her left deltoid, tolerated well. Will set up next appt for 1 month from now. Pt is aware to wait 14 days between vaccines before receiving her Covid vaccine.

## 2019-10-26 ENCOUNTER — Other Ambulatory Visit: Payer: Self-pay

## 2019-10-26 ENCOUNTER — Ambulatory Visit
Admission: RE | Admit: 2019-10-26 | Discharge: 2019-10-26 | Disposition: A | Discharge status: Home / Self Care | Payer: Medicare HMO | Source: Ambulatory Visit | Attending: Family Medicine | Admitting: Family Medicine

## 2019-10-26 ENCOUNTER — Other Ambulatory Visit: Payer: Self-pay | Admitting: Family Medicine

## 2019-10-26 DIAGNOSIS — N6320 Unspecified lump in the left breast, unspecified quadrant: Secondary | ICD-10-CM

## 2019-10-26 DIAGNOSIS — N6322 Unspecified lump in the left breast, upper inner quadrant: Secondary | ICD-10-CM | POA: Diagnosis not present

## 2019-10-26 DIAGNOSIS — R928 Other abnormal and inconclusive findings on diagnostic imaging of breast: Secondary | ICD-10-CM

## 2019-10-26 DIAGNOSIS — R599 Enlarged lymph nodes, unspecified: Secondary | ICD-10-CM

## 2019-11-04 ENCOUNTER — Ambulatory Visit
Admission: RE | Admit: 2019-11-04 | Discharge: 2019-11-04 | Disposition: A | Discharge status: Home / Self Care | Payer: Medicare HMO | Source: Ambulatory Visit | Attending: Family Medicine | Admitting: Family Medicine

## 2019-11-04 ENCOUNTER — Other Ambulatory Visit: Payer: Self-pay | Admitting: Family Medicine

## 2019-11-04 ENCOUNTER — Other Ambulatory Visit: Payer: Self-pay

## 2019-11-04 DIAGNOSIS — N6320 Unspecified lump in the left breast, unspecified quadrant: Secondary | ICD-10-CM

## 2019-11-04 DIAGNOSIS — R590 Localized enlarged lymph nodes: Secondary | ICD-10-CM | POA: Diagnosis not present

## 2019-11-04 DIAGNOSIS — N6322 Unspecified lump in the left breast, upper inner quadrant: Secondary | ICD-10-CM | POA: Diagnosis not present

## 2019-11-04 DIAGNOSIS — R599 Enlarged lymph nodes, unspecified: Secondary | ICD-10-CM

## 2019-11-04 HISTORY — PX: BREAST BIOPSY: SHX20

## 2019-11-05 ENCOUNTER — Other Ambulatory Visit (HOSPITAL_COMMUNITY)
Admission: RE | Admit: 2019-11-05 | Discharge: 2019-11-05 | Disposition: A | Discharge status: Home / Self Care | Payer: Medicare HMO | Source: Ambulatory Visit | Attending: Diagnostic Radiology | Admitting: Diagnostic Radiology

## 2019-11-05 DIAGNOSIS — R599 Enlarged lymph nodes, unspecified: Secondary | ICD-10-CM | POA: Diagnosis not present

## 2019-11-06 LAB — SURGICAL PATHOLOGY

## 2019-11-16 ENCOUNTER — Other Ambulatory Visit: Payer: Self-pay

## 2019-11-17 ENCOUNTER — Ambulatory Visit (INDEPENDENT_AMBULATORY_CARE_PROVIDER_SITE_OTHER): Payer: Medicare HMO | Admitting: *Deleted

## 2019-11-17 ENCOUNTER — Other Ambulatory Visit: Payer: Self-pay

## 2019-11-17 DIAGNOSIS — Z23 Encounter for immunization: Secondary | ICD-10-CM | POA: Diagnosis not present

## 2019-11-17 NOTE — Progress Notes (Signed)
Patient in office today for Hep B dose #2. Vaccine administered in left deltoid with no immediate reactions.

## 2019-11-23 ENCOUNTER — Other Ambulatory Visit: Payer: Self-pay | Admitting: Family Medicine

## 2019-11-23 DIAGNOSIS — I10 Essential (primary) hypertension: Secondary | ICD-10-CM

## 2019-11-30 ENCOUNTER — Ambulatory Visit: Payer: Medicare HMO | Attending: Internal Medicine

## 2019-11-30 DIAGNOSIS — Z20822 Contact with and (suspected) exposure to covid-19: Secondary | ICD-10-CM | POA: Diagnosis not present

## 2019-12-02 LAB — SARS-COV-2, NAA 2 DAY TAT

## 2019-12-02 LAB — NOVEL CORONAVIRUS, NAA: SARS-CoV-2, NAA: NOT DETECTED

## 2019-12-21 ENCOUNTER — Other Ambulatory Visit: Payer: Self-pay

## 2019-12-22 ENCOUNTER — Encounter: Payer: Self-pay | Admitting: Family Medicine

## 2019-12-22 ENCOUNTER — Ambulatory Visit (INDEPENDENT_AMBULATORY_CARE_PROVIDER_SITE_OTHER): Payer: Medicare HMO | Admitting: Family Medicine

## 2019-12-22 VITALS — BP 140/90 | HR 84 | Temp 97.6°F | Resp 12 | Ht 62.0 in | Wt 257.4 lb

## 2019-12-22 DIAGNOSIS — E559 Vitamin D deficiency, unspecified: Secondary | ICD-10-CM | POA: Diagnosis not present

## 2019-12-22 DIAGNOSIS — I10 Essential (primary) hypertension: Secondary | ICD-10-CM | POA: Diagnosis not present

## 2019-12-22 DIAGNOSIS — R252 Cramp and spasm: Secondary | ICD-10-CM

## 2019-12-22 DIAGNOSIS — R600 Localized edema: Secondary | ICD-10-CM

## 2019-12-22 LAB — VITAMIN D 25 HYDROXY (VIT D DEFICIENCY, FRACTURES): VITD: 26.17 ng/mL — ABNORMAL LOW (ref 30.00–100.00)

## 2019-12-22 LAB — BASIC METABOLIC PANEL
BUN: 13 mg/dL (ref 6–23)
CO2: 32 mEq/L (ref 19–32)
Calcium: 9.3 mg/dL (ref 8.4–10.5)
Chloride: 98 mEq/L (ref 96–112)
Creatinine, Ser: 0.93 mg/dL (ref 0.40–1.20)
GFR: 72.95 mL/min (ref >60.00)
Glucose, Bld: 100 mg/dL — ABNORMAL HIGH (ref 70–99)
Potassium: 3.5 mEq/L (ref 3.5–5.1)
Sodium: 138 mEq/L (ref 135–145)

## 2019-12-22 MED ORDER — TIZANIDINE HCL 4 MG PO TABS: 4.0000 mg | ORAL_TABLET | Freq: Every evening | ORAL | 1 refills | Status: DC | PRN

## 2019-12-22 NOTE — Patient Instructions (Signed)
A few things to remember from today's visit:   Hold on Pravastatin for 3-4 weeks. Zanaflex at bedtime x 10 days and then as needed. Vascular appt will be arranged. Compression stocking may help.  If you need refills please call your pharmacy. Do not use My Chart to request refills or for acute issues that need immediate attention.    Please be sure medication list is accurate. If a new problem present, please set up appointment sooner than planned today.

## 2019-12-22 NOTE — Progress Notes (Signed)
Chief Complaint  Patient presents with  . Bilateral leg pain and swelling    bilateral leg pain and swelling that started a while ago, getting worse   HPI: [name] is a 64 y.o. female, who is here today with above complaint. Problem is chronic and getting worse. +Cramps from thighs to feet, R>L. Intermittent, max 9/10. Exacerbated by getting up after prolonged sitting.  + Edema , worse at the end of the day. No erythema or skin rash/iulcers. Interfering with sleep.  She has been treated by ortho Opioid meds,gabapentin,and Cymbalta did not help. Ibuprofen 800 mg used to help. No associated back pain or numbness. She wonders if she needs to take K+ supplementation.  Lumbar MRI 12/2017 L4-5: Bilateral facet arthropathy with edema. Anterolisthesis of 3 mm that could worsen with standing or flexion. Bulging of the disc. Canal and foraminal stenosis that could be symptomatic. The facet arthropathy could also be a cause of back pain or referred facet syndrome pain. This appearance could worsen with standing or flexion.  L5-S1: Facet osteoarthritis right more than left, which similarly could be symptomatic. Mild right foraminal narrowing without definite neural compression. 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  Dx'ed in the 80's. She is on HCTZ 25 mg daily and Losartan 100 mg daily. She is not checking BP's regularly.  Lab Results  Component Value Date   CREATININE 0.85 08/26/2019   BUN 12 08/26/2019   NA 141 08/26/2019   K 3.6 08/26/2019   CL 104 08/26/2019   CO2 32 08/26/2019   Negative for severe/frequent headache, visual changes, chest pain, dyspnea, palpitation, or focal weakness.  She would like to have labs done. Vit D deficiency, She is on Ergocalciferol 50,000 U q 2 weeks. 25 OH vit D < 7.0 in 07/2019.  Review of Systems  Constitutional: Positive for fatigue. Negative for activity  change, appetite change and fever.  HENT: Negative for mouth sores, nosebleeds and sore throat.   Respiratory: Negative for cough and wheezing.   Gastrointestinal: Negative for abdominal pain, nausea and vomiting.       Negative for changes in bowel habits.  Genitourinary: Negative for decreased urine volume, dysuria and hematuria.  Musculoskeletal: Negative for gait problem.  Neurological: Negative for syncope, facial asymmetry and weakness.  Psychiatric/Behavioral: Positive for sleep disturbance. Negative for decreased concentration. The patient is nervous/anxious.   Rest see pertinent positives and negatives per HPI.  Current Outpatient Medications on File Prior to Visit  Medication Sig Dispense Refill  . acetaminophen (TYLENOL) 500 MG tablet Take 2 tablets (1,000 mg total) by mouth daily as needed for moderate pain (pain). 30 tablet 3  . diclofenac Sodium (VOLTAREN) 1 % GEL APPLY 4 G TOPICALLY 4 (FOUR) TIMES DAILY. 150 g 3  . esomeprazole (NEXIUM) 40 MG capsule Take 1 capsule (40 mg total) by mouth daily. 90 capsule 3  . ferrous sulfate 325 (65 FE) MG tablet Take 1 tablet (325 mg total) by mouth daily with breakfast. 90 tablet 3  . hydrochlorothiazide (HYDRODIURIL) 25 MG tablet Take 1 tablet (25 mg total) by mouth daily. 90 tablet 3  . losartan (COZAAR) 100 MG tablet TAKE 1 TABLET EVERY DAY 90 tablet 3  . polycarbophil (FIBERCON) 625 MG tablet Take 1 tablet (625 mg total) by mouth 2 (two) times daily. 180 tablet 2  . polyethylene glycol (MIRALAX / GLYCOLAX) packet Take 17 g by mouth daily. 14 each  3  . pravastatin (PRAVACHOL) 20 MG tablet Take 1 tablet (20 mg total) by mouth daily. 90 tablet 3  . Vitamin D, Ergocalciferol, (DRISDOL) 1.25 MG (50000 UNIT) CAPS capsule 1 cap weekly for 8 weeks then q 2 weeks. 12 capsule 1   No current facility-administered medications on file prior to visit.   Past Medical History:  Diagnosis Date  . Arthritis   . Constipation    on stool softener  .  GERD (gastroesophageal reflux disease)   . Hyperlipidemia   . Hypertension   . Trouble swallowing 2013   following lap band   No Known Allergies  Social History   Socioeconomic History  . Marital status: Married    Spouse name: Not on file  . Number of children: 1  . Years of education: Not on file  . Highest education level: Not on file  Occupational History  . Not on file  Tobacco Use  . Smoking status: Former Smoker    Types: Cigarettes    Quit date: 10/02/1972    Years since quitting: 47.2  . Smokeless tobacco: Never Used  Substance and Sexual Activity  . Alcohol use: No  . Drug use: No  . Sexual activity: Never  Other Topics Concern  . Not on file  Social History Narrative  . Not on file   Social Determinants of Health   Financial Resource Strain:   . Difficulty of Paying Living Expenses:   Food Insecurity:   . Worried About Charity fundraiser in the Last Year:   . Arboriculturist in the Last Year:   Transportation Needs:   . Film/video editor (Medical):   Marland Kitchen Lack of Transportation (Non-Medical):   Physical Activity:   . Days of Exercise per Week:   . Minutes of Exercise per Session:   Stress:   . Feeling of Stress :   Social Connections:   . Frequency of Communication with Friends and Family:   . Frequency of Social Gatherings with Friends and Family:   . Attends Religious Services:   . Active Member of Clubs or Organizations:   . Attends Archivist Meetings:   Marland Kitchen Marital Status:     Vitals:   12/22/19 1048  BP: 140/90  Pulse: 84  Resp: 12  Temp: 97.6 F (36.4 C)  SpO2: 99%   Body mass index is 47.07 kg/m.  Physical Exam  Nursing note and vitals reviewed. Constitutional: She is oriented to person, place, and time. She appears well-developed. No distress.  HENT:  Head: Normocephalic and atraumatic.  Mouth/Throat: Oropharynx is clear and moist and mucous membranes are normal.  Eyes: Pupils are equal, round, and reactive to  light. Conjunctivae are normal.  Cardiovascular: Normal rate and regular rhythm.  No murmur heard. Pulses:      Dorsalis pedis pulses are 2+ on the right side and 2+ on the left side.  Respiratory: Effort normal and breath sounds normal. No respiratory distress.  GI: Soft. She exhibits no mass. There is no hepatomegaly. There is no abdominal tenderness.  Musculoskeletal:        General: Edema (TRace pitting LE edema,bilateral.) present.     Lumbar back: No tenderness or bony tenderness.     Right lower leg: No tenderness or bony tenderness.     Left lower leg: No tenderness or bony tenderness.  Lymphadenopathy:    She has no cervical adenopathy.  Neurological: She is alert and oriented to person, place, and time.  She has normal strength. No cranial nerve deficit. Gait normal.  Skin: Skin is warm. No rash noted. No erythema.  Psychiatric: Her mood appears anxious.  Well groomed, good eye contact.    ASSESSMENT AND PLAN:  Ms. Paxten was seen today for bilateral leg pain and swelling.  Diagnoses and all orders for this visit:  Orders Placed This Encounter  Procedures  . Basic metabolic panel  . VITAMIN D 25 Hydroxy (Vit-D Deficiency, Fractures)  . Ambulatory referral to Vascular Surgery   Lab Results  Component Value Date   CREATININE 0.93 12/22/2019   BUN 13 12/22/2019   NA 138 12/22/2019   K 3.5 12/22/2019   CL 98 12/22/2019   CO2 32 12/22/2019    Bilateral lower extremity edema We have reviewed possible etiologies. I still thinks edema is caused by vein disease, which can also caused pain.  Gabapentin and Cymbalta did not help. Vascular referral placed.  Leg cramping Adequate hydration. Zanaflex 4 mg at bedtime may help. Stretching exercises. Further recommendations will be given according to BMP results.  Hypertension, essential, benign BP adequately controlled. Some side effects of Losartan and HCTZ discussed. Continue low salt diet.  Vitamin D  deficiency, unspecified No changes in current management, will follow 13 OH vit D result and will give further recommendations accordingly.    Return in about 6 months (around 06/23/2020).   Alexande Sheerin G. Martinique, MD  [facility]. [place] office.  Discharge Instructions   None    A few things to remember from today's visit:   Hold on Pravastatin for 3-4 weeks. Zanaflex at bedtime x 10 days and then as needed. Vascular appt will be arranged. Compression stocking may help.  If you need refills please call your pharmacy. Do not use My Chart to request refills or for acute issues that need immediate attention.    Please be sure medication list is accurate. If a new problem present, please set up appointment sooner than planned today.

## 2019-12-24 ENCOUNTER — Telehealth: Payer: Self-pay | Admitting: Family Medicine

## 2019-12-24 NOTE — Telephone Encounter (Signed)
Pt was suppose to start the Zanaflex but the pharmacy did not receive the refill. I called [facility] to confirm and they do not have anything on file for the pt.   Medication: Zanaflex Pharmacy: [facility]  FAX: [phone]

## 2019-12-25 ENCOUNTER — Other Ambulatory Visit: Payer: Self-pay | Admitting: *Deleted

## 2019-12-25 DIAGNOSIS — R252 Cramp and spasm: Secondary | ICD-10-CM

## 2019-12-25 MED ORDER — TIZANIDINE HCL 4 MG PO TABS: 4.0000 mg | ORAL_TABLET | Freq: Every evening | ORAL | 1 refills | Status: DC | PRN

## 2019-12-25 NOTE — Telephone Encounter (Signed)
There was an error message when Dr. Martinique tried to send Rx on 12/22/2019. Rx resent to the pharmacy.

## 2019-12-26 ENCOUNTER — Encounter: Payer: Self-pay | Admitting: Family Medicine

## 2019-12-27 MED ORDER — POTASSIUM CHLORIDE ER 10 MEQ PO TBCR: 10.0000 meq | EXTENDED_RELEASE_TABLET | Freq: Every day | ORAL | 2 refills | Status: DC

## 2019-12-29 MED ORDER — VITAMIN D (ERGOCALCIFEROL) 1.25 MG (50000 UNIT) PO CAPS: ORAL_CAPSULE | ORAL | 0 refills | Status: DC

## 2019-12-29 NOTE — Addendum Note (Signed)
Addended by: Gwenyth Ober R on: 12/29/2019 11:13 AM   Modules accepted: Orders

## 2020-02-04 ENCOUNTER — Other Ambulatory Visit: Payer: Self-pay | Admitting: *Deleted

## 2020-02-04 DIAGNOSIS — R600 Localized edema: Secondary | ICD-10-CM

## 2020-02-12 ENCOUNTER — Ambulatory Visit: Payer: Medicare HMO | Admitting: Vascular Surgery

## 2020-02-12 ENCOUNTER — Encounter: Payer: Self-pay | Admitting: Vascular Surgery

## 2020-02-12 ENCOUNTER — Other Ambulatory Visit: Payer: Self-pay

## 2020-02-12 ENCOUNTER — Ambulatory Visit (HOSPITAL_COMMUNITY)
Admission: RE | Admit: 2020-02-12 | Discharge: 2020-02-12 | Disposition: A | Discharge status: Home / Self Care | Payer: Medicare HMO | Source: Ambulatory Visit | Attending: Surgery | Admitting: Surgery

## 2020-02-12 VITALS — BP 134/57 | HR 89 | Temp 97.7°F | Resp 20 | Ht 62.0 in | Wt 255.5 lb

## 2020-02-12 DIAGNOSIS — R600 Localized edema: Secondary | ICD-10-CM

## 2020-02-12 DIAGNOSIS — M7989 Other specified soft tissue disorders: Secondary | ICD-10-CM

## 2020-02-12 NOTE — Progress Notes (Signed)
Patient ID: Jennifer Davila, female   DOB: 05-08-1956, 64 y.o.   MRN: [id number]  Reason for Consult: New Patient (Initial Visit)   Referred by Martinique, [name], MD  Subjective:     HPI:  Jennifer Davila is a 64 y.o. female with bilateral lower extremity swelling.  She has never had injuries to the bilateral lower extremities and denies any history of DVT.  She does not wear compression stockings.  She does not have any tissue loss or ulceration.  Risk factors for vascular disease include hyperlipidemia, hypertension.  She did have reflux studies performed prior to today's visit.  Past Medical History:  Diagnosis Date  . Arthritis   . Constipation    on stool softener  . GERD (gastroesophageal reflux disease)   . Hyperlipidemia   . Hypertension   . Trouble swallowing 2013   following lap band   Family History  Problem Relation Age of Onset  . Hypertension Mother   . Stroke Mother   . Diabetes Father   . Alcohol abuse Father   . Kidney disease Father   . Colon polyps Brother   . Cancer Brother   . Alcohol abuse Brother    Past Surgical History:  Procedure Laterality Date  . ABDOMINAL HYSTERECTOMY    . CHOLECYSTECTOMY    . COLONOSCOPY N/A 01/08/2018   Procedure: COLONOSCOPY;  Surgeon: Yetta Flock, MD;  Location: [facility];  Service: Gastroenterology;  Laterality: N/A;  . LAPAROSCOPIC GASTRIC BANDING    . LAPAROSCOPIC REPAIR AND REMOVAL OF GASTRIC BAND  2013-2014    Short Social History:  Social History   Tobacco Use  . Smoking status: Former Smoker    Types: Cigarettes    Quit date: 10/02/1972    Years since quitting: 47.3  . Smokeless tobacco: Never Used  Substance Use Topics  . Alcohol use: No    No Known Allergies  Current Outpatient Medications  Medication Sig Dispense Refill  . acetaminophen (TYLENOL) 500 MG tablet Take 2 tablets (1,000 mg total) by mouth daily as needed for moderate pain (pain). 30 tablet 3  . diclofenac  Sodium (VOLTAREN) 1 % GEL APPLY 4 G TOPICALLY 4 (FOUR) TIMES DAILY. 150 g 3  . esomeprazole (NEXIUM) 40 MG capsule Take 1 capsule (40 mg total) by mouth daily. 90 capsule 3  . ferrous sulfate 325 (65 FE) MG tablet Take 1 tablet (325 mg total) by mouth daily with breakfast. 90 tablet 3  . hydrochlorothiazide (HYDRODIURIL) 25 MG tablet Take 1 tablet (25 mg total) by mouth daily. 90 tablet 3  . losartan (COZAAR) 100 MG tablet TAKE 1 TABLET EVERY DAY 90 tablet 3  . polyethylene glycol (MIRALAX / GLYCOLAX) packet Take 17 g by mouth daily. 14 each 3  . potassium chloride (KLOR-CON) 10 MEQ tablet Take 1 tablet (10 mEq total) by mouth daily. 90 tablet 2  . tiZANidine (ZANAFLEX) 4 MG tablet Take 1 tablet (4 mg total) by mouth at bedtime as needed for muscle spasms. 30 tablet 1  . polycarbophil (FIBERCON) 625 MG tablet Take 1 tablet (625 mg total) by mouth 2 (two) times daily. (Patient not taking: Reported on 02/12/2020) 180 tablet 2  . pravastatin (PRAVACHOL) 20 MG tablet Take 1 tablet (20 mg total) by mouth daily. (Patient not taking: Reported on 02/12/2020) 90 tablet 3   No current facility-administered medications for this visit.    Review of Systems  Constitutional:  Constitutional negative. HENT: HENT negative.  Eyes: Eyes negative.  Respiratory: Respiratory negative.  Cardiovascular: Positive for leg swelling.  GI: Gastrointestinal negative.  Musculoskeletal: Positive for leg pain.  Neurological: Neurological negative. Hematologic: Hematologic/lymphatic negative.  Psychiatric: Psychiatric negative.        Objective:  Objective   Vitals:   02/12/20 1340  BP: (!) 134/57  Pulse: 89  Resp: 20  Temp: 97.7 F (36.5 C)  SpO2: 98%  Weight: 255 lb 8 oz (115.9 kg)  Height: 5\' 2"  (1.575 m)   Body mass index is 46.73 kg/m.  Physical Exam HENT:     Head: Normocephalic.     Nose:     Comments: Wearing a mask Cardiovascular:     Rate and Rhythm: Normal rate.     Pulses: Normal pulses.   Pulmonary:     Effort: Pulmonary effort is normal.  Abdominal:     General: Abdomen is flat.     Palpations: Abdomen is soft. There is no mass.  Musculoskeletal:     Cervical back: Normal range of motion.     Right lower leg: Edema present.     Left lower leg: Edema present.  Skin:    General: Skin is warm.     Capillary Refill: Capillary refill takes less than 2 seconds.  Neurological:     General: No focal deficit present.     Mental Status: She is alert.  Psychiatric:        Mood and Affect: Mood normal.        Behavior: Behavior normal.        Thought Content: Thought content normal.        Judgment: Judgment normal.     Data: I have independent interpreted bilateral lower extremity venous reflux study which demonstrates no evidence of superficial vein reflux bilaterally except in the left saphenofemoral junction.     Assessment/Plan:     64yo AAF with bilateral lower extremity swelling does not have saphenous reflux.  I have discussed knee-high compression socks and the importance of weight loss and elevation of her legs when recumbent.  She can follow-up here on an as-needed basis.     Waynetta Sandy MD [facility]

## 2020-02-23 ENCOUNTER — Encounter: Payer: Self-pay | Admitting: Family Medicine

## 2020-02-23 ENCOUNTER — Ambulatory Visit (INDEPENDENT_AMBULATORY_CARE_PROVIDER_SITE_OTHER): Payer: Medicare HMO | Admitting: Family Medicine

## 2020-02-23 ENCOUNTER — Other Ambulatory Visit: Payer: Self-pay

## 2020-02-23 VITALS — BP 136/80 | HR 83 | Temp 98.5°F | Resp 16 | Ht 62.0 in | Wt 258.1 lb

## 2020-02-23 DIAGNOSIS — R252 Cramp and spasm: Secondary | ICD-10-CM

## 2020-02-23 DIAGNOSIS — E876 Hypokalemia: Secondary | ICD-10-CM | POA: Diagnosis not present

## 2020-02-23 DIAGNOSIS — M79604 Pain in right leg: Secondary | ICD-10-CM

## 2020-02-23 DIAGNOSIS — M79605 Pain in left leg: Secondary | ICD-10-CM | POA: Diagnosis not present

## 2020-02-23 DIAGNOSIS — R7303 Prediabetes: Secondary | ICD-10-CM

## 2020-02-23 DIAGNOSIS — I10 Essential (primary) hypertension: Secondary | ICD-10-CM | POA: Diagnosis not present

## 2020-02-23 DIAGNOSIS — E785 Hyperlipidemia, unspecified: Secondary | ICD-10-CM | POA: Diagnosis not present

## 2020-02-23 DIAGNOSIS — E559 Vitamin D deficiency, unspecified: Secondary | ICD-10-CM

## 2020-02-23 MED ORDER — ACETAMINOPHEN 500 MG PO TABS: 500.0000 mg | ORAL_TABLET | Freq: Every day | ORAL | 1 refills | Status: DC | PRN

## 2020-02-23 NOTE — Patient Instructions (Addendum)
A few things to remember from today's visit:   Vitamin D deficiency, unspecified - Plan: VITAMIN D 25 Hydroxy (Vit-D Deficiency, Fractures)  Hypertension, essential, benign  Hyperlipidemia, unspecified hyperlipidemia type  Hypokalemia - Plan: Potassium  Continue Tylenol for leg pain. Monitor blood pressure at home. 1800 cal/day. Food diary.  10-15 min of walking daily.  Calorie Counting for Weight Loss Calories are units of energy. Your body needs a certain amount of calories from food to keep you going throughout the day. When you eat more calories than your body needs, your body stores the extra calories as fat. When you eat fewer calories than your body needs, your body burns fat to get the energy it needs. Calorie counting means keeping track of how many calories you eat and drink each day. Calorie counting can be helpful if you need to lose weight. If you make sure to eat fewer calories than your body needs, you should lose weight. Ask your health care provider what a healthy weight is for you. For calorie counting to work, you will need to eat the right number of calories in a day in order to lose a healthy amount of weight per week. A dietitian can help you determine how many calories you need in a day and will give you suggestions on how to reach your calorie goal.  A healthy amount of weight to lose per week is usually 1-2 lb (0.5-0.9 kg). This usually means that your daily calorie intake should be reduced by 500-750 calories.  Eating 1,200 - 1,500 calories per day can help most women lose weight.  Eating 1,500 - 1,800 calories per day can help most men lose weight. What is my plan? My goal is to have __1800 calories per day.  What do I need to know about calorie counting? In order to meet your daily calorie goal, you will need to:  Find out how many calories are in each food you would like to eat. Try to do this before you eat.  Decide how much of the food you plan to  eat.  Write down what you ate and how many calories it had. Doing this is called keeping a food log. To successfully lose weight, it is important to balance calorie counting with a healthy lifestyle that includes regular activity. Aim for 150 minutes of moderate exercise (such as walking) or 75 minutes of vigorous exercise (such as running) each week. Where do I find calorie information?  The number of calories in a food can be found on a Nutrition Facts label. If a food does not have a Nutrition Facts label, try to look up the calories online or ask your dietitian for help. Remember that calories are listed per serving. If you choose to have more than one serving of a food, you will have to multiply the calories per serving by the amount of servings you plan to eat. For example, the label on a package of bread might say that a serving size is 1 slice and that there are 90 calories in a serving. If you eat 1 slice, you will have eaten 90 calories. If you eat 2 slices, you will have eaten 180 calories. How do I keep a food log? Immediately after each meal, record the following information in your food log:  What you ate. Don't forget to include toppings, sauces, and other extras on the food.  How much you ate. This can be measured in cups, ounces, or number of items.  How many calories each food and drink had.  The total number of calories in the meal. Keep your food log near you, such as in a small notebook in your pocket, or use a mobile app or website. Some programs will calculate calories for you and show you how many calories you have left for the day to meet your goal. What are some calorie counting tips?   Use your calories on foods and drinks that will fill you up and not leave you hungry: ? Some examples of foods that fill you up are nuts and nut butters, vegetables, lean proteins, and high-fiber foods like whole grains. High-fiber foods are foods with more than 5 g fiber per  serving. ? Drinks such as sodas, specialty coffee drinks, alcohol, and juices have a lot of calories, yet do not fill you up.  Eat nutritious foods and avoid empty calories. Empty calories are calories you get from foods or beverages that do not have many vitamins or protein, such as candy, sweets, and soda. It is better to have a nutritious high-calorie food (such as an avocado) than a food with few nutrients (such as a bag of chips).  Know how many calories are in the foods you eat most often. This will help you calculate calorie counts faster.  Pay attention to calories in drinks. Low-calorie drinks include water and unsweetened drinks.  Pay attention to nutrition labels for "low fat" or "fat free" foods. These foods sometimes have the same amount of calories or more calories than the full fat versions. They also often have added sugar, starch, or salt, to make up for flavor that was removed with the fat.  Find a way of tracking calories that works for you. Get creative. Try different apps or programs if writing down calories does not work for you. What are some portion control tips?  Know how many calories are in a serving. This will help you know how many servings of a certain food you can have.  Use a measuring cup to measure serving sizes. You could also try weighing out portions on a kitchen scale. With time, you will be able to estimate serving sizes for some foods.  Take some time to put servings of different foods on your favorite plates, bowls, and cups so you know what a serving looks like.  Try not to eat straight from a bag or box. Doing this can lead to overeating. Put the amount you would like to eat in a cup or on a plate to make sure you are eating the right portion.  Use smaller plates, glasses, and bowls to prevent overeating.  Try not to multitask (for example, watch TV or use your computer) while eating. If it is time to eat, sit down at a table and enjoy your food.  This will help you to know when you are full. It will also help you to be aware of what you are eating and how much you are eating. What are tips for following this plan? Reading food labels  Check the calorie count compared to the serving size. The serving size may be smaller than what you are used to eating.  Check the source of the calories. Make sure the food you are eating is high in vitamins and protein and low in saturated and trans fats. Shopping  Read nutrition labels while you shop. This will help you make healthy decisions before you decide to purchase your food.  Make a grocery list  and stick to it. Cooking  Try to cook your favorite foods in a healthier way. For example, try baking instead of frying.  Use low-fat dairy products. Meal planning  Use more fruits and vegetables. Half of your plate should be fruits and vegetables.  Include lean proteins like poultry and fish. How do I count calories when eating out?  Ask for smaller portion sizes.  Consider sharing an entree and sides instead of getting your own entree.  If you get your own entree, eat only half. Ask for a box at the beginning of your meal and put the rest of your entree in it so you are not tempted to eat it.  If calories are listed on the menu, choose the lower calorie options.  Choose dishes that include vegetables, fruits, whole grains, low-fat dairy products, and lean protein.  Choose items that are boiled, broiled, grilled, or steamed. Stay away from items that are buttered, battered, fried, or served with cream sauce. Items labeled "crispy" are usually fried, unless stated otherwise.  Choose water, low-fat milk, unsweetened iced tea, or other drinks without added sugar. If you want an alcoholic beverage, choose a lower calorie option such as a glass of wine or light beer.  Ask for dressings, sauces, and syrups on the side. These are usually high in calories, so you should limit the amount you  eat.  If you want a salad, choose a garden salad and ask for grilled meats. Avoid extra toppings like bacon, cheese, or fried items. Ask for the dressing on the side, or ask for olive oil and vinegar or lemon to use as dressing.  Estimate how many servings of a food you are given. For example, a serving of cooked rice is  cup or about the size of half a baseball. Knowing serving sizes will help you be aware of how much food you are eating at restaurants. The list below tells you how big or small some common portion sizes are based on everyday objects: ? 1 oz--4 stacked dice. ? 3 oz--1 deck of cards. ? 1 tsp--1 die. ? 1 Tbsp-- a ping-pong ball. ? 2 Tbsp--1 ping-pong ball. ?  cup-- baseball. ? 1 cup--1 baseball. Summary  Calorie counting means keeping track of how many calories you eat and drink each day. If you eat fewer calories than your body needs, you should lose weight.  A healthy amount of weight to lose per week is usually 1-2 lb (0.5-0.9 kg). This usually means reducing your daily calorie intake by 500-750 calories.  The number of calories in a food can be found on a Nutrition Facts label. If a food does not have a Nutrition Facts label, try to look up the calories online or ask your dietitian for help.  Use your calories on foods and drinks that will fill you up, and not on foods and drinks that will leave you hungry.  Use smaller plates, glasses, and bowls to prevent overeating. This information is not intended to replace advice given to you by your health care provider. Make sure you discuss any questions you have with your health care provider. Document Revised: 04/04/2018 Document Reviewed: 06/15/2016 Elsevier Patient Education  [employer].  If you need refills please call your pharmacy. Do not use My Chart to request refills or for acute issues that need immediate attention.    Please be sure medication list is accurate. If a new problem present, please set up  appointment sooner than planned today.

## 2020-02-23 NOTE — Progress Notes (Signed)
HPI: [name] is a 64 y.o. female, who is here today for chronic disease management.  She was last seen on 12/22/2019, when she was concerned about lower extremity edema. She was evaluated by vascular on 02/12/2020. Compression stocking recommended. LE elevation helps.  Hypertension: Currently she is on losartan 100 mg daily and HCTZ 25 mg daily. Negative for severe/frequent headache, visual changes, chest pain, dyspnea, palpitation, claudication, focal weakness, and edema. HypoK+, she is on KLOR 10 meq daily.  Lab Results  Component Value Date   CREATININE 0.93 12/22/2019   BUN 13 12/22/2019   NA 138 12/22/2019   K 3.4 (L) 02/23/2020   CL 98 12/22/2019   CO2 32 12/22/2019    HLD: She is no longer taking pravastatin 20 mg, discontinued but leg pain did not improved.  Lab Results  Component Value Date   CHOL 196 08/26/2019   HDL 44.50 08/26/2019   LDLCALC 134 (H) 08/26/2019   TRIG 89.0 08/26/2019   CHOLHDL 4 08/26/2019   Frustrated about wt gain, she would like to try pharmacologic treatment. She has not exercise and nor followed a healthful diet. She would like to loss 10-15 Lb at least.  No breakfast, just coffee with cream and sugar. Fruit drinks a few times during the day. For lunch frozen foods: Spaghetti,hamburgers. Fried chicken and fries. Dinner around 6-7 pm. Cake or cookies daily around 8 Am.  Requesting Tylenol refills to take for LE pain. R>L since 2017-2018.  09/2017 elevated HgA1C. Lab Results  Component Value Date   HGBA1C 6.1 10/18/2017  Negative for polydipsia,polyuria, and polyphagia.  Muscle cramps, abdomen,LE's, and back. Chronic. She has not identified exacerbating or alleviating factors. Problem is stable. K+ supplementation did not help.  Vit D deficiency: Not taking Vit D supplementations. 25 OH vit < 7.0.  Review of Systems  Constitutional: Negative for activity change, appetite change, fatigue, fever and  unexpected weight change.  HENT: Negative for mouth sores, nosebleeds and sore throat.   Respiratory: Negative for cough and wheezing.   Gastrointestinal: Negative for abdominal pain, nausea and vomiting.       Negative for changes in bowel habits.  Genitourinary: Negative for decreased urine volume and hematuria.  Neurological: Negative for syncope, facial asymmetry and weakness.  Rest of ROS, see pertinent positives sand negatives in HPI  Current Outpatient Medications on File Prior to Visit  Medication Sig Dispense Refill  . diclofenac Sodium (VOLTAREN) 1 % GEL APPLY 4 G TOPICALLY 4 (FOUR) TIMES DAILY. 150 g 3  . esomeprazole (NEXIUM) 40 MG capsule Take 1 capsule (40 mg total) by mouth daily. 90 capsule 3  . ferrous sulfate 325 (65 FE) MG tablet Take 1 tablet (325 mg total) by mouth daily with breakfast. 90 tablet 3  . hydrochlorothiazide (HYDRODIURIL) 25 MG tablet Take 1 tablet (25 mg total) by mouth daily. 90 tablet 3  . losartan (COZAAR) 100 MG tablet TAKE 1 TABLET EVERY DAY 90 tablet 3  . polyethylene glycol (MIRALAX / GLYCOLAX) packet Take 17 g by mouth daily. 14 each 3  . potassium chloride (KLOR-CON) 10 MEQ tablet Take 1 tablet (10 mEq total) by mouth daily. 90 tablet 2  . tiZANidine (ZANAFLEX) 4 MG tablet Take 1 tablet (4 mg total) by mouth at bedtime as needed for muscle spasms. 30 tablet 1  . polycarbophil (FIBERCON) 625 MG tablet Take 1 tablet (625 mg total) by mouth 2 (two) times daily. (Patient not taking: Reported on 02/12/2020)  180 tablet 2  . pravastatin (PRAVACHOL) 20 MG tablet Take 1 tablet (20 mg total) by mouth daily. (Patient not taking: Reported on 02/12/2020) 90 tablet 3   No current facility-administered medications on file prior to visit.    Past Medical History:  Diagnosis Date  . Arthritis   . Constipation    on stool softener  . GERD (gastroesophageal reflux disease)   . Hyperlipidemia   . Hypertension   . Trouble swallowing 2013   following lap band    No Known Allergies  Social History   Socioeconomic History  . Marital status: Married    Spouse name: Not on file  . Number of children: 1  . Years of education: Not on file  . Highest education level: Not on file  Occupational History  . Not on file  Tobacco Use  . Smoking status: Former Smoker    Types: Cigarettes    Quit date: 10/02/1972    Years since quitting: 47.4  . Smokeless tobacco: Never Used  Vaping Use  . Vaping Use: Never used  Substance and Sexual Activity  . Alcohol use: No  . Drug use: No  . Sexual activity: Never  Other Topics Concern  . Not on file  Social History Narrative  . Not on file   Social Determinants of Health   Financial Resource Strain:   . Difficulty of Paying Living Expenses:   Food Insecurity:   . Worried About Charity fundraiser in the Last Year:   . Arboriculturist in the Last Year:   Transportation Needs:   . Film/video editor (Medical):   Marland Kitchen Lack of Transportation (Non-Medical):   Physical Activity:   . Days of Exercise per Week:   . Minutes of Exercise per Session:   Stress:   . Feeling of Stress :   Social Connections:   . Frequency of Communication with Friends and Family:   . Frequency of Social Gatherings with Friends and Family:   . Attends Religious Services:   . Active Member of Clubs or Organizations:   . Attends Archivist Meetings:   Marland Kitchen Marital Status:     Vitals:   02/23/20 0938  BP: (!) 136/80  Pulse: 83  Resp: 16  Temp: 98.5 F (36.9 C)  SpO2: 98%   Wt Readings from Last 3 Encounters:  02/23/20 (!) 258 lb 2 oz (117.1 kg)  02/12/20 255 lb 8 oz (115.9 kg)  12/22/19 257 lb 6 oz (116.7 kg)   Body mass index is 47.21 kg/m.   Physical Exam Vitals and nursing note reviewed.  Constitutional:      General: She is not in acute distress.    Appearance: She is well-developed.  HENT:     Head: Normocephalic and atraumatic.     Mouth/Throat:     Mouth: Mucous membranes are moist.      Pharynx: Oropharynx is clear.  Eyes:     Conjunctiva/sclera: Conjunctivae normal.     Pupils: Pupils are equal, round, and reactive to light.  Cardiovascular:     Rate and Rhythm: Normal rate and regular rhythm.     Pulses:          Dorsalis pedis pulses are 2+ on the right side and 2+ on the left side.     Heart sounds: No murmur heard.   Pulmonary:     Effort: Pulmonary effort is normal. No respiratory distress.     Breath sounds: Normal breath sounds.  Abdominal:     Palpations: Abdomen is soft. There is no hepatomegaly or mass.     Tenderness: There is no abdominal tenderness.  Musculoskeletal:     Right lower leg: Edema present.     Left lower leg: Edema present.     Comments: No pitting LE edema, pitting 2+ pedal bilateral.  Lymphadenopathy:     Cervical: No cervical adenopathy.  Skin:    General: Skin is warm.     Findings: No erythema or rash.  Neurological:     Mental Status: She is alert and oriented to person, place, and time.     Cranial Nerves: No cranial nerve deficit.     Gait: Gait normal.  Psychiatric:     Comments: Well groomed, good eye contact.     ASSESSMENT AND PLAN:  Jennifer Davila was seen today for chronic disease management.  Orders Placed This Encounter  Procedures  . Potassium  . VITAMIN D 25 Hydroxy (Vit-D Deficiency, Fractures)  . Hemoglobin A1c   Prediabetes Healthy life style and wt loss for primary prevention. Further recommendations accordingly to HgA1C.  Vitamin D deficiency, unspecified Further recommendations according to 30 OH vit D results.  Hypertension, essential, benign BP otherwise adequately controlled. Recommend monitoring BP regularly.  No changes in current management.  Hyperlipidemia, unspecified hyperlipidemia type Resume Pravastatin. Low fat diet also recommended.  Hypokalemia No changes in current management, will follow K+ result and will give further recommendations accordingly.  Pain in  both lower extremities Chronic. Could be related to venous disease. Lumbar MRI 12/2017 with mild abnormalities. Neurosurgeon referral 01/31/18.  -     acetaminophen (TYLENOL) 500 MG tablet; Take 1-2 tablets (500-1,000 mg total) by mouth daily as needed for moderate pain (pain).  Muscle cramps Adequate hydration. Wt loss may help. Further recommendations according to lab results.  Morbid obesity ([place]) We discussed benefits of wt loss as well as adverse effects of obesity. Consistency with healthy diet and physical activity recommended. Reviewed some dietary habits, healthier recommendations given. Stop fruit drinks,cookies,and cake. Increase water intake. Food diary, 1800 cal/day.  Return in about 6 weeks (around 04/05/2020).    Airis Barbee G. [place], MD  [facility]. [place] office.  A few things to remember from today's visit: Continue Tylenol for leg pain. Monitor blood pressure at home. 1800 cal/day. Food diary.  10-15 min of walking daily.

## 2020-02-25 ENCOUNTER — Encounter: Payer: Self-pay | Admitting: Family Medicine

## 2020-02-29 LAB — POTASSIUM: Potassium: 3.4 mmol/L — ABNORMAL LOW (ref 3.5–5.3)

## 2020-02-29 LAB — VITAMIN D 25 HYDROXY (VIT D DEFICIENCY, FRACTURES): Vit D, 25-Hydroxy: 38 ng/mL (ref 30–100)

## 2020-02-29 LAB — HEMOGLOBIN A1C

## 2020-03-01 ENCOUNTER — Other Ambulatory Visit: Payer: Self-pay | Admitting: Family Medicine

## 2020-03-01 ENCOUNTER — Telehealth: Payer: Self-pay

## 2020-03-01 ENCOUNTER — Other Ambulatory Visit: Payer: Self-pay

## 2020-03-01 DIAGNOSIS — R252 Cramp and spasm: Secondary | ICD-10-CM

## 2020-03-01 DIAGNOSIS — L509 Urticaria, unspecified: Secondary | ICD-10-CM

## 2020-03-01 MED ORDER — POTASSIUM CHLORIDE ER 10 MEQ PO TBCR: 10.0000 meq | EXTENDED_RELEASE_TABLET | Freq: Two times a day (BID) | ORAL | 2 refills | Status: DC

## 2020-03-01 NOTE — Telephone Encounter (Signed)
When I spoke with pt about her lab results, she mentioned wanting to get a referral in regards to the hives & welps she's been experiencing. Okay to place referral to allergists?

## 2020-03-02 NOTE — Telephone Encounter (Signed)
I saw her in 11/2019 and 01/2020, I do not remember talking about hives. If she is having recurrent hives it is ok to place immunologist referral. Thanks, BJ

## 2020-03-04 NOTE — Addendum Note (Signed)
Addended by: Rodrigo Ran on: 03/04/2020 10:33 AM   Modules accepted: Orders

## 2020-03-04 NOTE — Addendum Note (Signed)
Addended by: Rodrigo Ran on: 03/04/2020 10:31 AM   Modules accepted: Orders

## 2020-03-04 NOTE — Telephone Encounter (Signed)
I spoke with pt, she is aware that referral has been placed & that someone will contact her to set up the appointment.

## 2020-04-01 DIAGNOSIS — Z03818 Encounter for observation for suspected exposure to other biological agents ruled out: Secondary | ICD-10-CM | POA: Diagnosis not present

## 2020-04-06 ENCOUNTER — Encounter: Payer: Self-pay | Admitting: Family Medicine

## 2020-04-06 ENCOUNTER — Other Ambulatory Visit: Payer: Self-pay

## 2020-04-06 ENCOUNTER — Ambulatory Visit (INDEPENDENT_AMBULATORY_CARE_PROVIDER_SITE_OTHER): Payer: Medicare HMO | Admitting: Family Medicine

## 2020-04-06 DIAGNOSIS — I10 Essential (primary) hypertension: Secondary | ICD-10-CM

## 2020-04-06 DIAGNOSIS — D509 Iron deficiency anemia, unspecified: Secondary | ICD-10-CM

## 2020-04-06 DIAGNOSIS — R7303 Prediabetes: Secondary | ICD-10-CM | POA: Diagnosis not present

## 2020-04-06 DIAGNOSIS — E876 Hypokalemia: Secondary | ICD-10-CM

## 2020-04-06 MED ORDER — PHENTERMINE HCL 37.5 MG PO TABS: 37.5000 mg | ORAL_TABLET | Freq: Every day | ORAL | 1 refills | Status: DC

## 2020-04-06 NOTE — Patient Instructions (Signed)
A few things to remember from today's visit:   Hypokalemia - Plan: Potassium  Prediabetes - Plan: Hemoglobin A1c  Hypertension, essential, benign  Morbid obesity ([place]) - Plan: phentermine (ADIPEX-P) 37.5 MG tablet  Iron deficiency anemia, unspecified iron deficiency anemia type - Plan: CBC  Phentermine 1/2 tab daily and if well tolerated you can increase it to 1 tab in 2 weeks. This med can elevate blood pressure and aggravate constipation. Monitor blood pressure.  If you need refills please call your pharmacy. Do not use My Chart to request refills or for acute issues that need immediate attention.    Please be sure medication list is accurate. If a new problem present, please set up appointment sooner than planned today.

## 2020-04-06 NOTE — Progress Notes (Signed)
HPI: Jennifer Davila is a 64 y.o. female, who is here today for follow up.  Jennifer Davila was last seen on 02/23/20. Last visit we discuss dietary options for wt loss. Jennifer Davila did not keeping a food diary. Jennifer Davila stopped juice intake.  Jennifer Davila is not exercising.  Staying hungry all the time. Snacks on the "wrong stuff." Sometimes Jennifer Davila eats around 8 pm or later. Jennifer Davila is thinking about buying a dietary supplement to help with decreasing appetite.  Jennifer Davila would like to have blood work today.  HTN: Jennifer Davila is on Losartan 100 mg and HCTZ 25 mg daily. Jennifer Davila is not checking BP at home  HypoK+: Jennifer Davila is on KLOR 10 meq bid, dose was increased last visit. K+ supplementation did not help with LE pain. Compression stocking help.  Negative for severe/frequent headache,chest pain, dyspnea, palpitation, focal weakness, worsening edema.  Lab Results  Component Value Date   CREATININE 0.93 12/22/2019   BUN 13 12/22/2019   NA 138 12/22/2019   K 3.4 (L) 02/23/2020   CL 98 12/22/2019   CO2 32 12/22/2019   Constipation and bloating sensation. Jennifer Davila has up to 3 bowel movements daily when Jennifer Davila takes miralax, no diarrhea. Bloating sensation improves with passing gas. In general constipation has improved.  Iron def anemia: Jennifer Davila is on Fe Sulfate 325 mg q 2 days,tolerating better. Lab Results  Component Value Date   WBC 5.7 08/26/2019   HGB 12.4 08/26/2019   HCT 40.1 08/26/2019   MCV 74.4 (L) 08/26/2019   PLT 291.0 08/26/2019  Jennifer Davila has not noted abdominal pain,nausea,vomiting, blood in stool or melena.  Prediabetes:HgA1C was not done last visit.  Review of Systems  Constitutional: Negative for activity change, appetite change, fatigue and fever.  HENT: Negative for mouth sores, nosebleeds and trouble swallowing.   Eyes: Negative for redness and visual disturbance.  Respiratory: Negative for cough and wheezing.   Endocrine: Negative for polydipsia, polyphagia and polyuria.  Genitourinary: Negative for  decreased urine volume and hematuria.  Neurological: Negative for syncope and facial asymmetry.  Rest of ROS, see pertinent positives sand negatives in HPI  Current Outpatient Medications on File Prior to Visit  Medication Sig Dispense Refill  . acetaminophen (TYLENOL) 500 MG tablet Take 1-2 tablets (500-1,000 mg total) by mouth daily as needed for moderate pain (pain). 60 tablet 1  . diclofenac Sodium (VOLTAREN) 1 % GEL APPLY 4 G TOPICALLY 4 (FOUR) TIMES DAILY. 150 g 3  . esomeprazole (NEXIUM) 40 MG capsule Take 1 capsule (40 mg total) by mouth daily. 90 capsule 3  . ferrous sulfate 325 (65 FE) MG tablet Take 1 tablet (325 mg total) by mouth daily with breakfast. 90 tablet 3  . hydrochlorothiazide (HYDRODIURIL) 25 MG tablet Take 1 tablet (25 mg total) by mouth daily. 90 tablet 3  . losartan (COZAAR) 100 MG tablet TAKE 1 TABLET EVERY DAY 90 tablet 3  . polycarbophil (FIBERCON) 625 MG tablet Take 1 tablet (625 mg total) by mouth 2 (two) times daily. 180 tablet 2  . polyethylene glycol (MIRALAX / GLYCOLAX) packet Take 17 g by mouth daily. 14 each 3  . potassium chloride (KLOR-CON) 10 MEQ tablet Take 1 tablet (10 mEq total) by mouth 2 (two) times daily. 180 tablet 2  . pravastatin (PRAVACHOL) 20 MG tablet Take 1 tablet (20 mg total) by mouth daily. 90 tablet 3  . tiZANidine (ZANAFLEX) 4 MG tablet TAKE 1 TABLET (4 MG TOTAL) BY MOUTH AT BEDTIME AS NEEDED FOR MUSCLE  SPASMS. 30 tablet 1   No current facility-administered medications on file prior to visit.   Past Medical History:  Diagnosis Date  . Arthritis   . Constipation    on stool softener  . GERD (gastroesophageal reflux disease)   . Hyperlipidemia   . Hypertension   . Trouble swallowing 2013   following lap band   No Known Allergies  Social History   Socioeconomic History  . Marital status: Married    Spouse name: Not on file  . Number of children: 1  . Years of education: Not on file  . Highest education level: Not on file   Occupational History  . Not on file  Tobacco Use  . Smoking status: Former Smoker    Types: Cigarettes    Quit date: 10/02/1972    Years since quitting: 47.5  . Smokeless tobacco: Never Used  Vaping Use  . Vaping Use: Never used  Substance and Sexual Activity  . Alcohol use: No  . Drug use: No  . Sexual activity: Never  Other Topics Concern  . Not on file  Social History Narrative  . Not on file   Social Determinants of Health   Financial Resource Strain:   . Difficulty of Paying Living Expenses: Not on file  Food Insecurity:   . Worried About Charity fundraiser in the Last Year: Not on file  . Ran Out of Food in the Last Year: Not on file  Transportation Needs:   . Lack of Transportation (Medical): Not on file  . Lack of Transportation (Non-Medical): Not on file  Physical Activity:   . Days of Exercise per Week: Not on file  . Minutes of Exercise per Session: Not on file  Stress:   . Feeling of Stress : Not on file  Social Connections:   . Frequency of Communication with Friends and Family: Not on file  . Frequency of Social Gatherings with Friends and Family: Not on file  . Attends Religious Services: Not on file  . Active Member of Clubs or Organizations: Not on file  . Attends Archivist Meetings: Not on file  . Marital Status: Not on file   Vitals:   04/06/20 1040  BP: 126/90  Pulse: 98  Resp: 16  SpO2: 97%   Wt Readings from Last 3 Encounters:  04/06/20 255 lb 2 oz (115.7 kg)  02/23/20 (!) 258 lb 2 oz (117.1 kg)  02/12/20 255 lb 8 oz (115.9 kg)   Body mass index is 46.66 kg/m.  Physical Exam Vitals and nursing note reviewed.  Constitutional:      General: Jennifer Davila is not in acute distress.    Appearance: Jennifer Davila is well-developed.  HENT:     Head: Normocephalic and atraumatic.     Mouth/Throat:     Mouth: Mucous membranes are moist.     Pharynx: Oropharynx is clear.  Eyes:     Conjunctiva/sclera: Conjunctivae normal.     Pupils: Pupils are  equal, round, and reactive to light.  Cardiovascular:     Rate and Rhythm: Normal rate and regular rhythm.     Pulses:          Dorsalis pedis pulses are 2+ on the right side and 2+ on the left side.     Heart sounds: No murmur heard.      Comments: Trace pitting LE edema, bilateral Pulmonary:     Effort: Pulmonary effort is normal. No respiratory distress.     Breath sounds:  Normal breath sounds.  Abdominal:     Palpations: Abdomen is soft. There is no hepatomegaly or mass.     Tenderness: There is no abdominal tenderness.  Lymphadenopathy:     Cervical: No cervical adenopathy.  Skin:    General: Skin is warm.     Findings: No erythema or rash.  Neurological:     Mental Status: Jennifer Davila is alert and oriented to person, place, and time.     Cranial Nerves: No cranial nerve deficit.     Gait: Gait normal.  Psychiatric:     Comments: Well groomed, good eye contact.   ASSESSMENT AND PLAN:  Jennifer Davila was seen today for follow-up.  Orders Placed This Encounter  Procedures  . Potassium  . Hemoglobin A1c  . CBC   Lab Results  Component Value Date   HGBA1C 6.0 (H) 04/06/2020   Lab Results  Component Value Date   WBC 5.5 04/06/2020   HGB 12.9 04/06/2020   HCT 41.0 04/06/2020   MCV 75.6 (L) 04/06/2020   PLT 296 04/06/2020   Morbid obesity ([place]) Jennifer Davila lost about 3 Lb sine her last visit. We discussed benefits of wt loss as well as adverse effects of obesity. Consistency with healthy diet and physical activity recommended. We discussed pharmacologic treatment options and side effects. Phentermine side effects discussed. 1/2 tab x 2 weeks then 1 tab if well tolerated.  -     phentermine (ADIPEX-P) 37.5 MG tablet; Take 1 tablet (37.5 mg total) by mouth daily before breakfast.  Hypokalemia No changes in current management, will follow K+ done today and will adjust dose if needed.  Prediabetes Healthier life style and wt loss for primary prevention of  diabetes.  Hypertension, essential, benign DBP slightly elevated. Monitor BP regularly, phentermine side effects discussed. Low salt diet.  Iron deficiency anemia, unspecified iron deficiency anemia type Continue Fe sulfate 325 mg q 2 days. Jennifer Davila would like to stop med. Will make recommendations according to cbc results.   Return in about 4 weeks (around 05/04/2020) for 4-5 weeks.  Trenia Tennyson G. [place], MD  [facility] [facility]. [place] office.  A few things to remember from today's visit:   Hypokalemia - Plan: Potassium  Prediabetes - Plan: Hemoglobin A1c  Hypertension, essential, benign  Morbid obesity ([place]) - Plan: phentermine (ADIPEX-P) 37.5 MG tablet  Iron deficiency anemia, unspecified iron deficiency anemia type - Plan: CBC  Phentermine 1/2 tab daily and if well tolerated you can increase it to 1 tab in 2 weeks. This med can elevate blood pressure and aggravate constipation. Monitor blood pressure.  If you need refills please call your pharmacy. Do not use My Chart to request refills or for acute issues that need immediate attention.    Please be sure medication list is accurate. If a new problem present, please set up appointment sooner than planned today.

## 2020-04-07 ENCOUNTER — Encounter: Payer: Self-pay | Admitting: Family Medicine

## 2020-04-07 LAB — CBC
HCT: 41 % (ref 35.0–45.0)
Hemoglobin: 12.9 g/dL (ref 11.7–15.5)
MCH: 23.8 pg — ABNORMAL LOW (ref 27.0–33.0)
MCHC: 31.5 g/dL — ABNORMAL LOW (ref 32.0–36.0)
MCV: 75.6 fL — ABNORMAL LOW (ref 80.0–100.0)
MPV: 11.6 fL (ref 7.5–12.5)
Platelets: 296 10*3/uL (ref 140–400)
RBC: 5.42 10*6/uL — ABNORMAL HIGH (ref 3.80–5.10)
RDW: 14.2 % (ref 11.0–15.0)
WBC: 5.5 10*3/uL (ref 3.8–10.8)

## 2020-04-07 LAB — HEMOGLOBIN A1C
Hgb A1c MFr Bld: 6 % of total Hgb — ABNORMAL HIGH (ref <5.7)
Mean Plasma Glucose: 126 (calc)
eAG (mmol/L): 7 (calc)

## 2020-04-07 LAB — POTASSIUM: Potassium: 3.6 mmol/L (ref 3.5–5.3)

## 2020-04-19 ENCOUNTER — Encounter: Payer: Self-pay | Admitting: Allergy & Immunology

## 2020-04-19 ENCOUNTER — Other Ambulatory Visit: Payer: Self-pay

## 2020-04-19 ENCOUNTER — Ambulatory Visit (INDEPENDENT_AMBULATORY_CARE_PROVIDER_SITE_OTHER): Payer: Medicare HMO | Admitting: Allergy & Immunology

## 2020-04-19 VITALS — BP 150/90 | HR 85 | Temp 98.1°F | Resp 20 | Ht 62.0 in | Wt 251.0 lb

## 2020-04-19 DIAGNOSIS — L508 Other urticaria: Secondary | ICD-10-CM | POA: Diagnosis not present

## 2020-04-19 DIAGNOSIS — J310 Chronic rhinitis: Secondary | ICD-10-CM | POA: Diagnosis not present

## 2020-04-19 MED ORDER — MONTELUKAST SODIUM 10 MG PO TABS: 10.0000 mg | ORAL_TABLET | Freq: Every day | ORAL | 5 refills | Status: DC

## 2020-04-19 MED ORDER — CETIRIZINE HCL 10 MG PO TABS: 10.0000 mg | ORAL_TABLET | Freq: Two times a day (BID) | ORAL | 5 refills | Status: DC

## 2020-04-19 NOTE — Patient Instructions (Addendum)
1. Chronic urticaria - Your history does not have any "red flags" such as fevers, joint pains, or permanent skin changes that would be concerning for a more serious cause of hives.   - Testing to all of the most common foods was negative.  - We will get some labs to rule out serious causes of hives: complete blood count, tryptase level, alpha gal panel, CMP, ESR, and CRP. - We will call you in 1-2 weeks with the results of the testing. - Chronic hives are often times a self limited process and will "burn themselves out" over 6-12 months, although this is not always the case.  - In the meantime, start suppressive dosing of antihistamines:   - Morning: Zyrtec (cetirizine) 67m (one tablet)  - Evening: Zyrtec (cetirizine) 159m(one tablet) + Singulair (montelukast) 1061m- You can change this dosing at home, decreasing the dose as needed or increasing the dosing as needed.  - If you are not tolerating the medications or are tired of taking them every day, we can start treatment with a monthly injectable medication called Xolair.   2. Chronic rhinitis - Testing today showed: negative to the entire panel - Copy of test results provided.  - We are going to get blood work to confirm this.  - We will call you in 1-2 weeks with the results of the testing.  - Avoidance measures provided. - Start taking: Zyrtec (cetirizine) 62m28mblet 1-2 times daily as above and Singulair (montelukast) 62mg104mly and fluticasone one spray per nostril at night (to help with the postnasal drip and mucous in the morning) - You can use an extra dose of the antihistamine, if needed, for breakthrough symptoms.  - Consider nasal saline rinses 1-2 times daily to remove allergens from the nasal cavities as well as help with mucous clearance (this is especially helpful to do before the nasal sprays are given)  3. Return in about 4 weeks (around 05/17/2020).    Please inform us of[place] Emergency Department visits,  hospitalizations, or changes in symptoms. Call us be[place] going to the ED for breathing or allergy symptoms since we might be able to fit you in for a sick visit. Feel free to contact us an[place]ime with any questions, problems, or concerns.  It was a pleasure to meet you today!  Websites that have reliable patient information: 1. American Academy of Asthma, Allergy, and Immunology: www.aaaai.org 2. Food Allergy Research and Education (FARE): foodallergy.org 3. Mothers of Asthmatics: http://www.asthmacommunitynetwork.org 4. American College of Allergy, Asthma, and Immunology: www.acaai.org   COVID-19 Vaccine Information can be found at: [email or web address] related to vaccine distribution or appointments, please email [email or web address] or call [phone] "Like" us [email or web address] and Instagram for our latest updates!       Make sure you are registered to vote! If you have moved or changed any of your contact information, you will need to get this updated before voting!  In some cases, you MAY be able to register to vote online: [email or web address]

## 2020-04-19 NOTE — Progress Notes (Signed)
NEW PATIENT  Date of Service/Encounter:  04/19/20  Referring provider: Martinique, [name], MD   Assessment:   Chronic urticaria - with negative testing to the most common foods and environmental panel  Chronic rhinitis   GERD - on PPI and diagnosed in high school (? Nissen procedure at some point, but history unclear)  S/p laproscopic banding procedure (now reversed)  Plan/Recommendations:   1. Chronic urticaria - Your history does not have any "red flags" such as fevers, joint pains, or permanent skin changes that would be concerning for a more serious cause of hives.   - Testing to all of the most common foods was negative.  - We will get some labs to rule out serious causes of hives: complete blood count, tryptase level, alpha gal panel, CMP, ESR, and CRP. - We will call you in 1-2 weeks with the results of the testing. - Chronic hives are often times a self limited process and will "burn themselves out" over 6-12 months, although this is not always the case.  - In the meantime, start suppressive dosing of antihistamines:   - Morning: Zyrtec (cetirizine) 36m (one tablet)  - Evening: Zyrtec (cetirizine) 118m(one tablet) + Singulair (montelukast) 1080m- You can change this dosing at home, decreasing the dose as needed or increasing the dosing as needed.  - If you are not tolerating the medications or are tired of taking them every day, we can start treatment with a monthly injectable medication called Xolair.   2. Chronic rhinitis - Testing today showed: negative to the entire panel - Copy of test results provided.  - We are going to get blood work to confirm this.  - We will call you in 1-2 weeks with the results of the testing.  - Avoidance measures provided. - Start taking: Zyrtec (cetirizine) 81m30mblet 1-2 times daily as above and Singulair (montelukast) 81mg34mly and fluticasone one spray per nostril at night (to help with the postnasal drip and mucous in the  morning) - You can use an extra dose of the antihistamine, if needed, for breakthrough symptoms.  - Consider nasal saline rinses 1-2 times daily to remove allergens from the nasal cavities as well as help with mucous clearance (this is especially helpful to do before the nasal sprays are given)  3. Return in about 4 weeks (around 05/17/2020).   Subjective:   Jennifer Davila 64 y.3 female presenting today for evaluation of  Chief Complaint  Patient presents with  . Urticaria    wakes up with hives on her face. develops swelling with mosquito bites.     Jennifer Davila history of the following: Patient Active Problem List   Diagnosis Date Noted  . Iron deficiency anemia 07/07/2018  . Hematochezia 06/26/2018  . Diverticulosis of colon with hemorrhage   . Leg pain 01/06/2018  . Bilateral lower extremity edema 01/06/2018  . Lower GI bleed 01/06/2018  . Hypokalemia 01/06/2018  . Constipation 01/06/2018  . Leg cramping 10/18/2017  . Vitamin D deficiency, unspecified 10/18/2017  . GERD (gastroesophageal reflux disease) 10/18/2017  . Hypertension, essential, benign 09/04/2017  . Hyperlipidemia 09/04/2017  . Morbid obesity (HCC) [place]06/2019  . Vertigo 09/04/2017  . Hip pain 05/25/2015    History obtained from: chart review and patient.  Jennifer Davila by JordaMartiniquety G, MD.     Jennifer Davila 64 y.56 female presenting for an evaluation of urticaria. She reports that she  has problems with "feeling" the hives on her face as well as her arms. She has them on her neck. She thinks that mosquitoes are a trigger. Symptoms have been going on for around one month or two months at the most. She went up to [place] on a trip and she woke up with swelling of her face. It happens at her house as well. She can "feel" when it is "coming on". She has not taken anything to treat it. It typically gets better over the course of the day and resolves over  time.   Prior to two months ago, she never had it. She feels that her body is "falling apart" because she is getting old. She never has fevers with these or joint pain.   She denies any allergic rhinitis symptoms at all. She does have some mucous in the morning. She has been having AM mucous production for years. She has never been on a nose spray at all. She does have heart burn. She is on Nexium. She has been on that since high school. She has a long history of reflux. She did apparently have attempted surgery for what sounds like a Nissen procedure in the late 1990s. She also reports that she had a lap band procedure done in the late 1990s or so.  Otherwise, there is no history of other atopic diseases, including asthma, food allergies, drug allergies, stinging insect allergies, eczema, urticaria or contact dermatitis. There is no significant infectious history. Vaccinations are up to date.    Past Medical History: Patient Active Problem List   Diagnosis Date Noted  . Iron deficiency anemia 07/07/2018  . Hematochezia 06/26/2018  . Diverticulosis of colon with hemorrhage   . Leg pain 01/06/2018  . Bilateral lower extremity edema 01/06/2018  . Lower GI bleed 01/06/2018  . Hypokalemia 01/06/2018  . Constipation 01/06/2018  . Leg cramping 10/18/2017  . Vitamin D deficiency, unspecified 10/18/2017  . GERD (gastroesophageal reflux disease) 10/18/2017  . Hypertension, essential, benign 09/04/2017  . Hyperlipidemia 09/04/2017  . Morbid obesity ([facility]) 09/04/2017  . Vertigo 09/04/2017  . Hip pain 05/25/2015    Medication List:  Allergies as of 04/19/2020   No Known Allergies     Medication List       Accurate as of April 19, 2020 11:58 AM. If you have any questions, ask your nurse or doctor.        STOP taking these medications   diclofenac Sodium 1 % Gel Commonly known as: VOLTAREN Stopped by: Valentina Shaggy, MD   ferrous sulfate 325 (65 FE) MG tablet Stopped by:  Valentina Shaggy, MD   polycarbophil 625 MG tablet Commonly known as: FIBERCON Stopped by: Valentina Shaggy, MD   tiZANidine 4 MG tablet Commonly known as: ZANAFLEX Stopped by: Valentina Shaggy, MD     TAKE these medications   acetaminophen 500 MG tablet Commonly known as: TYLENOL Take 1-2 tablets (500-1,000 mg total) by mouth daily as needed for moderate pain (pain).   cetirizine 10 MG tablet Commonly known as: ZYRTEC Take 1 tablet (10 mg total) by mouth 2 (two) times daily. Started by: Valentina Shaggy, MD   esomeprazole 40 MG capsule Commonly known as: NEXIUM Take 1 capsule (40 mg total) by mouth daily.   hydrochlorothiazide 25 MG tablet Commonly known as: HYDRODIURIL Take 1 tablet (25 mg total) by mouth daily.   losartan 100 MG tablet Commonly known as: COZAAR TAKE 1 TABLET EVERY DAY   montelukast  10 MG tablet Commonly known as: SINGULAIR Take 1 tablet (10 mg total) by mouth at bedtime. Started by: Valentina Shaggy, MD   phentermine 37.5 MG tablet Commonly known as: ADIPEX-P Take 1 tablet (37.5 mg total) by mouth daily before breakfast.   polyethylene glycol 17 g packet Commonly known as: MIRALAX / GLYCOLAX Take 17 g by mouth daily.   potassium chloride 10 MEQ tablet Commonly known as: KLOR-CON Take 1 tablet (10 mEq total) by mouth 2 (two) times daily.   pravastatin 20 MG tablet Commonly known as: PRAVACHOL Take 1 tablet (20 mg total) by mouth daily.       Birth History: non-contributory  Developmental History: non-contributory  Past Surgical History: Past Surgical History:  Procedure Laterality Date  . ABDOMINAL HYSTERECTOMY    . CHOLECYSTECTOMY    . COLONOSCOPY N/A 01/08/2018   Procedure: COLONOSCOPY;  Surgeon: Yetta Flock, MD;  Location: [facility];  Service: Gastroenterology;  Laterality: N/A;  . LAPAROSCOPIC GASTRIC BANDING    . LAPAROSCOPIC REPAIR AND REMOVAL OF GASTRIC BAND  2013-2014     Family  History: Family History  Problem Relation Age of Onset  . Hypertension Mother   . Stroke Mother   . Diabetes Father   . Alcohol abuse Father   . Kidney disease Father   . Colon polyps Brother   . Cancer Brother   . Alcohol abuse Brother      Social History: Romanita lives at home with her family.  She lives in a house with hardwood throughout the home.  She has electric heating and central cooling.  There are cats outside of the home.  There are no dust mite covers on the bedding.  There is no tobacco exposure.  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  She does not use a HEPA filter.  She does not live near an interstate or industrial area.  She started smoking in high school" "a long time ago".   Review of Systems  Constitutional: Negative.  Negative for fever, malaise/fatigue and weight loss.  HENT: Positive for congestion. Negative for ear discharge, ear pain and sore throat.        Positive for postnasal drip.  Eyes: Negative for pain, discharge and redness.  Respiratory: Negative for cough, sputum production, shortness of breath and wheezing.   Cardiovascular: Negative.  Negative for chest pain and palpitations.  Gastrointestinal: Negative for abdominal pain, heartburn, nausea and vomiting.  Skin: Positive for rash. Negative for itching.  Neurological: Negative for dizziness and headaches.  Endo/Heme/Allergies: Negative for environmental allergies. Does not bruise/bleed easily.       Objective:   Blood pressure (!) 150/90, pulse 85, temperature 98.1 F (36.7 C), temperature source Temporal, resp. rate 20, height _0  (1.575 m), weight 251 lb (113.9 kg), SpO2 98 %. Body mass index is 45.91 kg/m.   Physical Exam:   Physical Exam Constitutional:      Appearance: She is well-developed. She is obese.     Comments: Cooperative with the exam.  Pleasant.  Not the best historian.  HENT:     Head: Normocephalic  and atraumatic.     Right Ear: Tympanic membrane, ear canal and external ear normal. No drainage, swelling or tenderness. Tympanic membrane is not injected, scarred, erythematous, retracted or bulging.     Left Ear: Tympanic membrane, ear canal and external ear normal. No drainage, swelling or tenderness. Tympanic membrane  is not injected, scarred, erythematous, retracted or bulging.     Nose: No nasal deformity, septal deviation, mucosal edema or rhinorrhea.     Right Turbinates: Enlarged and swollen.     Left Turbinates: Enlarged and swollen.     Right Sinus: No maxillary sinus tenderness or frontal sinus tenderness.     Left Sinus: No maxillary sinus tenderness or frontal sinus tenderness.     Mouth/Throat:     Mouth: Mucous membranes are not pale and not dry.     Pharynx: Uvula midline.     Comments: No cobblestoning.  Tonsils unremarkable.  There is some erythema in the posterior oropharynx. Eyes:     General:        Right eye: No discharge.        Left eye: No discharge.     Conjunctiva/sclera: Conjunctivae normal.     Right eye: Right conjunctiva is not injected. No chemosis.    Left eye: Left conjunctiva is not injected. No chemosis.    Pupils: Pupils are equal, round, and reactive to light.  Cardiovascular:     Rate and Rhythm: Normal rate and regular rhythm.     Heart sounds: Normal heart sounds.  Pulmonary:     Effort: Pulmonary effort is normal. No tachypnea, accessory muscle usage or respiratory distress.     Breath sounds: Normal breath sounds. No wheezing, rhonchi or rales.     Comments: Moving air well in all lung fields.  No increased work of breathing. Chest:     Chest wall: No tenderness.  Abdominal:     Tenderness: There is no abdominal tenderness. There is no guarding or rebound.  Lymphadenopathy:     Head:     Right side of head: No submandibular, tonsillar or occipital adenopathy.     Left side of head: No submandibular, tonsillar or occipital adenopathy.      Cervical: No cervical adenopathy.  Skin:    Coloration: Skin is not pale.     Findings: No abrasion, erythema, petechiae or rash. Rash is not papular, urticarial or vesicular.     Comments: No urticaria present.  No dermatographia.  She does have an area of swelling which she had to point out on the left forearm.  There is no discoloration.  There is no joint pain.  Neurological:     Mental Status: She is alert.      Diagnostic studies:   Allergy Studies:     Airborne Adult Perc - 04/19/20 1000    Time Antigen Placed 1012    Allergen Manufacturer [name]    Location Back    Number of Test 59    1. Control-Buffer 50% Glycerol Negative    2. Control-Histamine 1 mg/ml 2+    3. Albumin saline Negative    4. Yale Negative    5. Guatemala Negative    6. Johnson Negative    7. Fruithurst Blue Negative    8. Meadow Fescue Negative    9. Perennial Rye Negative    10. Sweet Vernal Negative    11. Timothy Negative    12. Cocklebur Negative    13. Burweed Marshelder Negative    14. Ragweed, short Negative    15. Ragweed, Giant Negative    16. Plantain,  English Negative    17. Lamb's Quarters Negative    18. Sheep Sorrell Negative    19. Rough Pigweed Negative    20. Marsh Elder, Rough Negative    21. Mugwort, Common Negative  22. Ash mix Negative    23. Birch mix Negative    24. Beech American Negative    25. Box, Elder Negative    26. Cedar, red Negative    27. Cottonwood, Russian Federation Negative    28. Elm mix Negative    29. Hickory Negative    30. Maple mix Negative    31. Oak, Russian [place] mix Negative    32. Pecan Pollen Negative    33. Pine mix Negative    34. Sycamore Eastern Negative    35. Mountain Park, Black Pollen Negative    36. Alternaria alternata Negative    37. Cladosporium Herbarum Negative    38. Aspergillus mix Negative    39. Penicillium mix Negative    40. Bipolaris sorokiniana (Helminthosporium) Negative    41. Drechslera spicifera (Curvularia) Negative    42. Mucor  plumbeus Negative    43. Fusarium moniliforme Negative    44. Aureobasidium pullulans (pullulara) Negative    45. Rhizopus oryzae Negative    46. Botrytis cinera Negative    47. Epicoccum nigrum Negative    48. Phoma betae Negative    49. Candida Albicans Negative    50. Trichophyton mentagrophytes Negative    51. Mite, D Farinae  5,000 AU/ml Negative    52. Mite, D Pteronyssinus  5,000 AU/ml Negative    53. Cat Hair 10,000 BAU/ml Negative    54.  Dog Epithelia Negative    55. Mixed Feathers Negative    56. Horse Epithelia Negative    57. Cockroach, German Negative    58. Mouse Negative    59. Tobacco Leaf Negative          Food Perc - 04/19/20 1000      Test Information   Time Antigen Placed 1012    Allergen Manufacturer [name]    Location Back    Number of allergen test 10      Food   1. Peanut Negative    2. Soybean food Negative    3. Wheat, whole Negative    4. Sesame Negative    5. Milk, cow Negative    6. Egg White, chicken Negative    7. Casein Negative    8. Shellfish mix Negative    9. Fish mix Negative    10. Cashew Negative           Allergy testing results were read and interpreted by myself, documented by clinical staff.         Salvatore Marvel, MD [facility]

## 2020-04-25 LAB — IGE+ALLERGENS ZONE 2(30)
Alternaria Alternata IgE: 0.36 kU/L — AB
Amer Sycamore IgE Qn: <0.1 kU/L
Aspergillus Fumigatus IgE: 0.11 kU/L — AB
Bahia Grass IgE: <0.1 kU/L
Bermuda Grass IgE: <0.1 kU/L
Cat Dander IgE: <0.1 kU/L
Cedar, Mountain IgE: <0.1 kU/L
Cladosporium Herbarum IgE: 0.1 kU/L — AB
Cockroach, American IgE: <0.1 kU/L
Common Silver Birch IgE: <0.1 kU/L
D Farinae IgE: 0.15 kU/L — AB
D Pteronyssinus IgE: 0.18 kU/L — AB
Dog Dander IgE: <0.1 kU/L
Elm, American IgE: <0.1 kU/L
Hickory, White IgE: <0.1 kU/L
IgE (Immunoglobulin E), Serum: 65 IU/mL (ref 6–495)
Johnson Grass IgE: <0.1 kU/L
Maple/Box Elder IgE: <0.1 kU/L
Mucor Racemosus IgE: <0.1 kU/L
Mugwort IgE Qn: <0.1 kU/L
Nettle IgE: <0.1 kU/L
Oak, White IgE: <0.1 kU/L
Penicillium Chrysogen IgE: 0.13 kU/L — AB
Pigweed, Rough IgE: <0.1 kU/L
Plantain, English IgE: <0.1 kU/L
Ragweed, Short IgE: <0.1 kU/L
Sheep Sorrel IgE Qn: <0.1 kU/L
Stemphylium Herbarum IgE: 0.18 kU/L — AB
Sweet gum IgE RAST Ql: <0.1 kU/L
Timothy Grass IgE: <0.1 kU/L
White Mulberry IgE: <0.1 kU/L

## 2020-04-25 LAB — C-REACTIVE PROTEIN: CRP: 31 mg/L — ABNORMAL HIGH (ref 0–10)

## 2020-04-25 LAB — CBC WITH DIFFERENTIAL
Basophils Absolute: 0 10*3/uL (ref 0.0–0.2)
Basos: 0 %
EOS (ABSOLUTE): 0.2 10*3/uL (ref 0.0–0.4)
Eos: 3 %
Hematocrit: 40.5 % (ref 34.0–46.6)
Hemoglobin: 12.6 g/dL (ref 11.1–15.9)
Immature Grans (Abs): 0 10*3/uL (ref 0.0–0.1)
Immature Granulocytes: 0 %
Lymphocytes Absolute: 1.2 10*3/uL (ref 0.7–3.1)
Lymphs: 25 %
MCH: 23.4 pg — ABNORMAL LOW (ref 26.6–33.0)
MCHC: 31.1 g/dL — ABNORMAL LOW (ref 31.5–35.7)
MCV: 75 fL — ABNORMAL LOW (ref 79–97)
Monocytes Absolute: 0.4 10*3/uL (ref 0.1–0.9)
Monocytes: 7 %
Neutrophils Absolute: 3.3 10*3/uL (ref 1.4–7.0)
Neutrophils: 65 %
RBC: 5.39 x10E6/uL — ABNORMAL HIGH (ref 3.77–5.28)
RDW: 14.2 % (ref 11.7–15.4)
WBC: 5.1 10*3/uL (ref 3.4–10.8)

## 2020-04-25 LAB — CMP14+EGFR
ALT: 6 IU/L (ref 0–32)
AST: 17 IU/L (ref 0–40)
Albumin/Globulin Ratio: 1.7 (ref 1.2–2.2)
Albumin: 4.6 g/dL (ref 3.8–4.8)
Alkaline Phosphatase: 112 IU/L (ref 44–121)
BUN/Creatinine Ratio: 12 (ref 12–28)
BUN: 12 mg/dL (ref 8–27)
Bilirubin Total: 0.3 mg/dL (ref 0.0–1.2)
CO2: 27 mmol/L (ref 20–29)
Calcium: 9.6 mg/dL (ref 8.7–10.3)
Chloride: 97 mmol/L (ref 96–106)
Creatinine, Ser: 1 mg/dL (ref 0.57–1.00)
GFR calc Af Amer: 68 mL/min/{1.73_m2} (ref >59)
GFR calc non Af Amer: 59 mL/min/{1.73_m2} — ABNORMAL LOW (ref >59)
Globulin, Total: 2.7 g/dL (ref 1.5–4.5)
Glucose: 105 mg/dL — ABNORMAL HIGH (ref 65–99)
Potassium: 3.8 mmol/L (ref 3.5–5.2)
Sodium: 139 mmol/L (ref 134–144)
Total Protein: 7.3 g/dL (ref 6.0–8.5)

## 2020-04-25 LAB — SEDIMENTATION RATE: Sed Rate: 72 mm/hr — ABNORMAL HIGH (ref 0–40)

## 2020-04-25 LAB — ANA W/REFLEX IF POSITIVE
Anti JO-1: <0.2 AI (ref 0.0–0.9)
Anti Nuclear Antibody (ANA): POSITIVE — AB
Centromere Ab Screen: <0.2 AI (ref 0.0–0.9)
Chromatin Ab SerPl-aCnc: <0.2 AI (ref 0.0–0.9)
ENA RNP Ab: 6.1 AI — ABNORMAL HIGH (ref 0.0–0.9)
ENA SM Ab Ser-aCnc: <0.2 AI (ref 0.0–0.9)
ENA SSA (RO) Ab: <0.2 AI (ref 0.0–0.9)
ENA SSB (LA) Ab: <0.2 AI (ref 0.0–0.9)
Scleroderma (Scl-70) (ENA) Antibody, IgG: <0.2 AI (ref 0.0–0.9)
dsDNA Ab: <1 IU/mL (ref 0–9)

## 2020-04-25 LAB — TRYPTASE: Tryptase: 9.3 ug/L (ref 2.2–13.2)

## 2020-04-25 LAB — ALPHA-GAL PANEL
Alpha Gal IgE*: <0.1 kU/L (ref <0.10)
Beef (Bos spp) IgE: <0.1 kU/L (ref <0.35)
Class Interpretation: 0
Class Interpretation: 0
Class Interpretation: 0
Lamb/Mutton (Ovis spp) IgE: <0.1 kU/L (ref <0.35)
Pork (Sus spp) IgE: <0.1 kU/L (ref <0.35)

## 2020-05-05 ENCOUNTER — Telehealth: Payer: Self-pay

## 2020-05-05 NOTE — Telephone Encounter (Signed)
I left a detailed voicemail for the patient regarding her rheumatology referral.  Her referral is under review & their office will contact to schedule.   [facility] [address] [place],  [place]  [address] Main: [phone]  Thanks

## 2020-05-05 NOTE — Telephone Encounter (Signed)
Great - thank you!   Jandiel Magallanes, MD [facility]  

## 2020-05-05 NOTE — Telephone Encounter (Signed)
-----   Message from Valentina Shaggy, MD sent at 05/03/2020  6:04 AM EDT ----- Please refer to Rheumatology (diagnosis positive ANA).   Salvatore Marvel, MD [facility]

## 2020-05-12 ENCOUNTER — Telehealth: Payer: Self-pay | Admitting: Family Medicine

## 2020-05-12 NOTE — Progress Notes (Signed)
  Chronic Care Management   Outreach Note  05/12/2020 Name: Jennifer Davila MRN: [id number] DOB: Jun 07, 1956  Referred by: Martinique, [name], MD Reason for referral : No chief complaint on file.   An unsuccessful telephone outreach was attempted today. The patient was referred to the pharmacist for assistance with care management and care coordination.   Follow Up Plan:   Carley Perdue [employer] Scheduler

## 2020-05-13 ENCOUNTER — Telehealth: Payer: Self-pay | Admitting: Family Medicine

## 2020-05-13 NOTE — Progress Notes (Signed)
  Chronic Care Management   Outreach Note  05/13/2020 Name: Jennifer Davila MRN: [id number] DOB: Feb 16, 1956  Referred by: Martinique, [name], MD Reason for referral : No chief complaint on file.   A second unsuccessful telephone outreach was attempted today. The patient was referred to pharmacist for assistance with care management and care coordination.  Follow Up Plan:   Carley Perdue [employer] Scheduler

## 2020-05-18 ENCOUNTER — Ambulatory Visit: Payer: Medicare HMO

## 2020-05-18 ENCOUNTER — Ambulatory Visit (INDEPENDENT_AMBULATORY_CARE_PROVIDER_SITE_OTHER): Payer: Medicare HMO | Admitting: Family Medicine

## 2020-05-18 ENCOUNTER — Other Ambulatory Visit: Payer: Self-pay

## 2020-05-18 ENCOUNTER — Encounter: Payer: Self-pay | Admitting: Family Medicine

## 2020-05-18 DIAGNOSIS — M79605 Pain in left leg: Secondary | ICD-10-CM | POA: Diagnosis not present

## 2020-05-18 DIAGNOSIS — Z23 Encounter for immunization: Secondary | ICD-10-CM

## 2020-05-18 DIAGNOSIS — R197 Diarrhea, unspecified: Secondary | ICD-10-CM | POA: Diagnosis not present

## 2020-05-18 DIAGNOSIS — I10 Essential (primary) hypertension: Secondary | ICD-10-CM | POA: Diagnosis not present

## 2020-05-18 DIAGNOSIS — E876 Hypokalemia: Secondary | ICD-10-CM

## 2020-05-18 MED ORDER — POTASSIUM CHLORIDE CRYS ER 20 MEQ PO TBCR: 20.0000 meq | EXTENDED_RELEASE_TABLET | Freq: Every day | ORAL | 1 refills | Status: DC

## 2020-05-18 NOTE — Progress Notes (Deleted)
HPI:   [name] is a 64 y.o. female, who is here today to follow on recent OV/ER.   She took Phentermine for a week, discontinued because elevated BP.   She was evaluated by immunologist and she was referred to rheumatologist, 06/03/20.  LLE pain and foot cramp at night. Pain is posterior tight. *** Compression stocking and elevation help.  She is not taking Miralax now because she is now having 2 bm daily and some are loose.  Review of Systems Rest see pertinent positives and negatives per HPI.   Current Outpatient Medications on File Prior to Visit  Medication Sig Dispense Refill  . acetaminophen (TYLENOL) 500 MG tablet Take 1-2 tablets (500-1,000 mg total) by mouth daily as needed for moderate pain (pain). 60 tablet 1  . cetirizine (ZYRTEC) 10 MG tablet Take 1 tablet (10 mg total) by mouth 2 (two) times daily. 60 tablet 5  . esomeprazole (NEXIUM) 40 MG capsule Take 1 capsule (40 mg total) by mouth daily. 90 capsule 3  . hydrochlorothiazide (HYDRODIURIL) 25 MG tablet Take 1 tablet (25 mg total) by mouth daily. 90 tablet 3  . losartan (COZAAR) 100 MG tablet TAKE 1 TABLET EVERY DAY 90 tablet 3  . montelukast (SINGULAIR) 10 MG tablet Take 1 tablet (10 mg total) by mouth at bedtime. 30 tablet 5  . phentermine (ADIPEX-P) 37.5 MG tablet Take 1 tablet (37.5 mg total) by mouth daily before breakfast. 30 tablet 1  . potassium chloride (KLOR-CON) 10 MEQ tablet Take 1 tablet (10 mEq total) by mouth 2 (two) times daily. 180 tablet 2  . pravastatin (PRAVACHOL) 20 MG tablet Take 1 tablet (20 mg total) by mouth daily. 90 tablet 3   No current facility-administered medications on file prior to visit.     Past Medical History:  Diagnosis Date  . Arthritis   . Constipation    on stool softener  . GERD (gastroesophageal reflux disease)   . Hyperlipidemia   . Hypertension   . Trouble swallowing 2013   following lap band  . Urticaria    No Known Allergies   Social History   Socioeconomic History  . Marital status: Married    Spouse name: Not on file  . Number of children: 1  . Years of education: Not on file  . Highest education level: Not on file  Occupational History  . Not on file  Tobacco Use  . Smoking status: Former Smoker    Types: Cigarettes    Quit date: 10/02/1972    Years since quitting: 47.6  . Smokeless tobacco: Never Used  Vaping Use  . Vaping Use: Never used  Substance and Sexual Activity  . Alcohol use: No  . Drug use: No  . Sexual activity: Never  Other Topics Concern  . Not on file  Social History Narrative  . Not on file   Social Determinants of Health   Financial Resource Strain:   . Difficulty of Paying Living Expenses: Not on file  Food Insecurity:   . Worried About Charity fundraiser in the Last Year: Not on file  . Ran Out of Food in the Last Year: Not on file  Transportation Needs:   . Lack of Transportation (Medical): Not on file  . Lack of Transportation (Non-Medical): Not on file  Physical Activity:   . Days of Exercise per Week: Not on file  . Minutes of Exercise per Session: Not on file  Stress:   .  Feeling of Stress : Not on file  Social Connections:   . Frequency of Communication with Friends and Family: Not on file  . Frequency of Social Gatherings with Friends and Family: Not on file  . Attends Religious Services: Not on file  . Active Member of Clubs or Organizations: Not on file  . Attends Archivist Meetings: Not on file  . Marital Status: Not on file    Vitals:   05/18/20 1004  BP: 128/76  Pulse: 97  SpO2: 100%   Wt Readings from Last 3 Encounters:  05/18/20 246 lb (111.6 kg)  04/19/20 251 lb (113.9 kg)  04/06/20 255 lb 2 oz (115.7 kg)    Body mass index is 44.99 kg/m.      Physical Exam Vitals and nursing note reviewed.  Constitutional:      General: She is not in acute distress.    Appearance: She is well-developed.  HENT:     Head:  Normocephalic and atraumatic.  Eyes:     Conjunctiva/sclera: Conjunctivae normal.     Pupils: Pupils are equal, round, and reactive to light.  Cardiovascular:     Rate and Rhythm: Normal rate and regular rhythm.     Pulses:          Dorsalis pedis pulses are 2+ on the right side and 2+ on the left side.     Heart sounds: No murmur heard.      Comments: No pitting LE edema. *** Pulmonary:     Effort: Pulmonary effort is normal. No respiratory distress.     Breath sounds: Normal breath sounds.  Abdominal:     Palpations: Abdomen is soft. There is no hepatomegaly or mass.     Tenderness: There is no abdominal tenderness.  Lymphadenopathy:     Cervical: No cervical adenopathy.  Skin:    General: Skin is warm.     Findings: No erythema or rash.     Comments: Cheeks hypopigmentation changes, salt and pepper ***  Neurological:     Mental Status: She is alert and oriented to person, place, and time.     Cranial Nerves: No cranial nerve deficit.     Gait: Gait normal.  Psychiatric:     Comments: Well groomed, good eye contact.     ASSESSMENT AND PLAN:  Jennifer Davila was seen today for follow-up.  Diagnoses and all orders for this visit:  Need for hepatitis B vaccination -     Cancel: Hepatitis B vaccine adult IM -     Hepatitis B vaccine adult IM     Orders Placed This Encounter  Procedures  . Hepatitis B vaccine adult IM    Jennifer Davila was seen today for follow-up.  Diagnoses and all orders for this visit:  Need for hepatitis B vaccination -     Cancel: Hepatitis B vaccine adult IM -     Hepatitis B vaccine adult IM    No problem-specific Assessment & Plan notes found for this encounter.      Jennifer Davila G. Martinique, MD  [facility]. [place] office.

## 2020-05-18 NOTE — Patient Instructions (Addendum)
A few things to remember from today's visit:   Need for hepatitis B vaccination - Plan: Hepatitis B vaccine adult IM, CANCELED: Hepatitis B vaccine adult IM  Hypokalemia  Hypertension, essential, benign  Pain of left lower extremity  Diarrhea, unspecified type  Adequate hydration. Keep appt with rheuma. potassium changes to 40 meq, so you take one tab daily.  If you need refills please call your pharmacy. Do not use My Chart to request refills or for acute issues that need immediate attention.    Please be sure medication list is accurate. If a new problem present, please set up appointment sooner than planned today.

## 2020-05-20 ENCOUNTER — Other Ambulatory Visit: Payer: Self-pay | Admitting: Family Medicine

## 2020-05-20 DIAGNOSIS — I10 Essential (primary) hypertension: Secondary | ICD-10-CM

## 2020-05-21 NOTE — Progress Notes (Signed)
HPI: [name] is a 64 y.o. female, who is here today to follow on recent OV. She was last seen 04/06/20, when pharmacologic treatment for wt loss was started, phentermine.  She took Phentermine for a week, discontinued because elevated BP, 169/97. Negative for severe/frequent headache, visual changes, chest pain, dyspnea, palpitation, focal weakness, or worsening LE edema. She is on HCTZ 25 mg daily and Losartan 100 mg daily.  HypoK+: She is on KLOR 10 meq bid. Lab Results  Component Value Date   CREATININE 1.00 04/19/2020   BUN 12 04/19/2020   NA 139 04/19/2020   K 3.8 04/19/2020   CL 97 04/19/2020   CO2 27 04/19/2020   She has tried to eat healthier and has noted wt lost. Increased water intake. She is not eating after 6 pm. Decreased juice intake,1 glass daily, small sips through the day. She is walking in place while watching TV, during the commercials.  Recurrent urticaria, evaluated by immunologist. Positive ANA, so she was referred to rheumatologist, appt  on11/5/21.  Chronic LLE pain and foot cramp at night. Pain is in posterior tight.  Compression stocking and elevation help.  She is not taking Miralax now because constipation improved. Since she changed her diet ,she is having 2 bowel movements daily,some are loose. Negative for abdominal pain,N/V,or urinary symptoms.  Hx of GI bleed.  She has not noted blood in stool or melena. Colonoscopy 01/08/18.  Review of Systems  Constitutional: Negative for appetite change, diaphoresis and fever.  HENT: Negative for mouth sores, nosebleeds and sore throat.   Respiratory: Negative for cough and wheezing.   Genitourinary: Negative for dysuria and hematuria.       No decreased urine output.  Musculoskeletal: Negative for gait problem and joint swelling.  Neurological: Negative for syncope, facial asymmetry and weakness.  Psychiatric/Behavioral: Negative for confusion.  Rest see pertinent positives and  negatives per HPI.  Current Outpatient Medications on File Prior to Visit  Medication Sig Dispense Refill  . acetaminophen (TYLENOL) 500 MG tablet Take 1-2 tablets (500-1,000 mg total) by mouth daily as needed for moderate pain (pain). 60 tablet 1  . cetirizine (ZYRTEC) 10 MG tablet Take 1 tablet (10 mg total) by mouth 2 (two) times daily. 60 tablet 5  . esomeprazole (NEXIUM) 40 MG capsule Take 1 capsule (40 mg total) by mouth daily. 90 capsule 3  . hydrochlorothiazide (HYDRODIURIL) 25 MG tablet Take 1 tablet (25 mg total) by mouth daily. 90 tablet 3  . montelukast (SINGULAIR) 10 MG tablet Take 1 tablet (10 mg total) by mouth at bedtime. 30 tablet 5  . pravastatin (PRAVACHOL) 20 MG tablet Take 1 tablet (20 mg total) by mouth daily. 90 tablet 3   No current facility-administered medications on file prior to visit.   Past Medical History:  Diagnosis Date  . Arthritis   . Constipation    on stool softener  . GERD (gastroesophageal reflux disease)   . Hyperlipidemia   . Hypertension   . Trouble swallowing 2013   following lap band  . Urticaria    No Known Allergies  Social History   Socioeconomic History  . Marital status: Married    Spouse name: Not on file  . Number of children: 1  . Years of education: Not on file  . Highest education level: Not on file  Occupational History  . Not on file  Tobacco Use  . Smoking status: Former Smoker    Types: Cigarettes  Quit date: 10/02/1972    Years since quitting: 47.6  . Smokeless tobacco: Never Used  Vaping Use  . Vaping Use: Never used  Substance and Sexual Activity  . Alcohol use: No  . Drug use: No  . Sexual activity: Never  Other Topics Concern  . Not on file  Social History Narrative  . Not on file   Social Determinants of Health   Financial Resource Strain:   . Difficulty of Paying Living Expenses: Not on file  Food Insecurity:   . Worried About Charity fundraiser in the Last Year: Not on file  . Ran Out of  Food in the Last Year: Not on file  Transportation Needs:   . Lack of Transportation (Medical): Not on file  . Lack of Transportation (Non-Medical): Not on file  Physical Activity:   . Days of Exercise per Week: Not on file  . Minutes of Exercise per Session: Not on file  Stress:   . Feeling of Stress : Not on file  Social Connections:   . Frequency of Communication with Friends and Family: Not on file  . Frequency of Social Gatherings with Friends and Family: Not on file  . Attends Religious Services: Not on file  . Active Member of Clubs or Organizations: Not on file  . Attends Archivist Meetings: Not on file  . Marital Status: Not on file   Vitals:   05/18/20 1004  BP: 128/76  Pulse: 97  Resp: 16  SpO2: 100%   Wt Readings from Last 3 Encounters:  05/18/20 246 lb (111.6 kg)  04/19/20 251 lb (113.9 kg)  04/06/20 255 lb 2 oz (115.7 kg)   Body mass index is 44.99 kg/m.  Physical Exam Vitals and nursing note reviewed.  Constitutional:      General: She is not in acute distress.    Appearance: She is well-developed and well-groomed.  HENT:     Head: Normocephalic and atraumatic.     Mouth/Throat:     Mouth: Mucous membranes are moist.     Pharynx: Oropharynx is clear.  Eyes:     Conjunctiva/sclera: Conjunctivae normal.     Pupils: Pupils are equal, round, and reactive to light.  Cardiovascular:     Rate and Rhythm: Normal rate and regular rhythm.     Pulses:          Dorsalis pedis pulses are 2+ on the right side and 2+ on the left side.     Heart sounds: No murmur heard.      Comments: Trace pitting LE edema, bilateral. Pulmonary:     Effort: Pulmonary effort is normal. No respiratory distress.     Breath sounds: Normal breath sounds.  Abdominal:     Palpations: Abdomen is soft. There is no hepatomegaly or mass.     Tenderness: There is no abdominal tenderness.  Lymphadenopathy:     Cervical: No cervical adenopathy.  Skin:    General: Skin is  warm.     Findings: No erythema or rash.     Comments: Cheeks with hypopigmentation changes.  Neurological:     Mental Status: She is alert and oriented to person, place, and time.     Cranial Nerves: No cranial nerve deficit.     Gait: Gait normal.   ASSESSMENT AND PLAN:  [name] was seen today for follow-up.  Diagnoses and all orders for this visit:  Morbid obesity ([place]) She lost about 5 Lb since her last visit. Encouraged to  be consistent with regular physical activity and low impact physical activity.   Hypokalemia KLOR changed from 10 meq tab to 20 meq to continue once daily. She is on HCTZ, side effects discussed, medication also helps with LE edema.  -     potassium chloride SA (KLOR-CON M20) 20 MEQ tablet; Take 1 tablet (20 mEq total) by mouth daily.  Hypertension, essential, benign BP adequately controlled. Continue HCTZ 25 mg daily and Losartan 100 mg daily. Low salt diet.  Pain of left lower extremity Chronic. She has had work-up done, including lumbar MRI and vascular evaluation. Improves with compression stockings.  Ibuprofen is the only medication that has helped but discontinue after GI bleed. She is hoping that rheumatologist finds out the specific case and therefore problem ca be treated.  Diarrhea, unspecified type Could be related to dietary changes. For now no further work up recommended. Adequate hydration. Monitor for new symptoms.  Return in about 4 months (around 09/18/2020).  Hisae Decoursey G. [place], MD  [facility]. [place] office.   A few things to remember from today's visit:   Need for hepatitis B vaccination - Plan: Hepatitis B vaccine adult IM, CANCELED: Hepatitis B vaccine adult IM  Hypokalemia  Hypertension, essential, benign  Pain of left lower extremity  Diarrhea, unspecified type  Adequate hydration. Keep appt with rheuma. potassium changes to 40 meq, so you take one tab daily.  If you need refills please  call your pharmacy. Do not use My Chart to request refills or for acute issues that need immediate attention.    Please be sure medication list is accurate. If a new problem present, please set up appointment sooner than planned today.

## 2020-05-26 ENCOUNTER — Other Ambulatory Visit: Payer: Self-pay

## 2020-05-26 ENCOUNTER — Encounter: Payer: Self-pay | Admitting: Allergy & Immunology

## 2020-05-26 ENCOUNTER — Ambulatory Visit: Payer: Medicare HMO | Admitting: Allergy & Immunology

## 2020-05-26 VITALS — BP 140/88 | HR 80 | Temp 98.4°F | Resp 16

## 2020-05-26 DIAGNOSIS — R768 Other specified abnormal immunological findings in serum: Secondary | ICD-10-CM | POA: Diagnosis not present

## 2020-05-26 DIAGNOSIS — L508 Other urticaria: Secondary | ICD-10-CM | POA: Diagnosis not present

## 2020-05-26 DIAGNOSIS — J3089 Other allergic rhinitis: Secondary | ICD-10-CM | POA: Insufficient documentation

## 2020-05-26 DIAGNOSIS — J302 Other seasonal allergic rhinitis: Secondary | ICD-10-CM

## 2020-05-26 MED ORDER — MONTELUKAST SODIUM 10 MG PO TABS
10.0000 mg | ORAL_TABLET | Freq: Every day | ORAL | 5 refills | Status: DC
Start: 2020-05-26 — End: 2021-02-02

## 2020-05-26 MED ORDER — CETIRIZINE HCL 10 MG PO TABS
10.0000 mg | ORAL_TABLET | Freq: Two times a day (BID) | ORAL | 5 refills | Status: DC
Start: 2020-05-26 — End: 2021-02-02

## 2020-05-26 NOTE — Patient Instructions (Addendum)
1. Chronic urticaria - We will see what Rheumatology thinks of your labs. - In the meantime, continue with suppressive dosing of antihistamines:   - Morning: Zyrtec (cetirizine) 10mg  (one tablet)  - Evening: Zyrtec (cetirizine) 10mg  (one tablet) + Singulair (montelukast) 10mg   - You can change this dosing at home, decreasing the dose as needed or increasing the dosing as needed.  - We will schedule you for patch testing (bring in samples of your own hair products for testing as well).   2. Chronic rhinitis (dust mites, molds) - Start taking: Zyrtec (cetirizine) 10mg  tablet 1-2 times daily as above and Singulair (montelukast) 10mg  daily and fluticasone one spray per nostril at night (to help with the postnasal drip and mucous in the morning) - You can use an extra dose of the antihistamine, if needed, for breakthrough symptoms.   3. Return in about 2 weeks (around 06/09/2020) for [facility] TESTING .    Please inform us of any Emergency Department visits, hospitalizations, or changes in symptoms. Call us before going to the ED for breathing or allergy symptoms since we might be able to fit you in for a sick visit. Feel free to contact us anytime with any questions, problems, or concerns.  It was a pleasure to see you again today!  Websites that have reliable patient information: 1. American Academy of Asthma, Allergy, and Immunology: www.aaaai.org 2. Food Allergy Research and Education (FARE): foodallergy.org 3. Mothers of Asthmatics: http://www.asthmacommunitynetwork.org 4. American College of Allergy, Asthma, and Immunology: www.acaai.org   COVID-19 Vaccine Information can be found at: ShippingScam.co.uk For questions related to vaccine distribution or appointments, please email vaccine@[email or web address] .com or call [phone].     "Like" [place] on Facebook and Instagram for our latest updates!     HAPPY FALL!     Make sure you are  registered to vote! If you have moved or changed any of your contact information, you will need to get this updated before voting!  In some cases, you MAY be able to register to vote online: [email or web address]     True Test looks for the following sensitivities:

## 2020-05-26 NOTE — Progress Notes (Signed)
FOLLOW UP  Date of Service/Encounter:  05/26/20   Assessment:   Chronic urticaria - with negative testing to the most common foods and environmental panel  Chronic rhinitis   GERD - on PPI and diagnosed in high school (? Nissen procedure at some point, but history unclear)  S/p laproscopic banding procedure (now reversed)  Plan/Recommendations:   1. Chronic urticaria - We will see what Rheumatology thinks of your labs. - In the meantime, continue with suppressive dosing of antihistamines:   - Morning: Zyrtec (cetirizine) 10mg  (one tablet)  - Evening: Zyrtec (cetirizine) 10mg  (one tablet) + Singulair (montelukast) 10mg   - You can change this dosing at home, decreasing the dose as needed or increasing the dosing as needed.  - We will schedule you for patch testing (bring in samples of your own hair products for testing as well).   2. Chronic rhinitis (dust mites, molds) - Start taking: Zyrtec (cetirizine) 10mg  tablet 1-2 times daily as above and Singulair (montelukast) 10mg  daily and fluticasone one spray per nostril at night (to help with the postnasal drip and mucous in the morning) - You can use an extra dose of the antihistamine, if needed, for breakthrough symptoms.   3. Return in about 2 weeks (around 06/09/2020) for [facility] .   Subjective:   Jennifer Davila is a 64 y.o. female presenting today for follow up of  Chief Complaint  Patient presents with  . Urticaria  . Allergic Rhinitis     Jennifer Davila has a history of the following: Patient Active Problem List   Diagnosis Date Noted  . Chronic urticaria 05/26/2020  . Seasonal and perennial allergic rhinitis 05/26/2020  . Positive ANA (antinuclear antibody) 05/26/2020  . Iron deficiency anemia 07/07/2018  . Hematochezia 06/26/2018  . Diverticulosis of colon with hemorrhage   . Leg pain 01/06/2018  . Bilateral lower extremity edema 01/06/2018  . Lower GI bleed 01/06/2018  .  Hypokalemia 01/06/2018  . Constipation 01/06/2018  . Leg cramping 10/18/2017  . Vitamin D deficiency, unspecified 10/18/2017  . GERD (gastroesophageal reflux disease) 10/18/2017  . Hypertension, essential, benign 09/04/2017  . Hyperlipidemia 09/04/2017  . Morbid obesity ([place]) 09/04/2017  . Vertigo 09/04/2017  . Hip pain 05/25/2015    History obtained from: chart review and patient.  Jennifer Davila is a 64 y.o. female presenting for a follow up visit.  She was last seen in September 2021.  At that time, we did a full work-up for a history of chronic urticaria and pruritus.  We did testing to the environmental allergens and the most common foods and this was negative.  She had a history of GERD and we continued her on her PPI.  We started her on Zyrtec twice daily as well as Singulair at night.  Her labs came back and were positive for ANA and a reflex test was positive for anti-RNP.  We referred her to rheumatology for further evaluation.  She did have an environmental allergy panel that was positive to dust mites, indoor molds, and outdoor molds.  Alpha gal panel was negative.  Metabolic panel was normal.  Her complete blood count showed a microcytic anemia which seems to have been stable per her trends.  Since the last visit, she has been around the same. She is having al ot of itching. She is not taking any medications because she would not afford them. She did not have the funds at the time for the medications. She was having problems with traveling  to the pharmacy.   She is having less of a rash at this point, but she is itching a lot on the back of her neck.  She also points to a rash that has been concerning her on her bilateral cheeks.  She does not know of any history of lupus in her family.  She denies having any joint pains or fever. She is having the rashes and does ot understand any particular trigger. She is scheduled with Dr. Benjamine Mola on November 5th.  She would like to know more about  her prognosis if she ends up having lupus or mixed connective tissue disease.  Otherwise, there have been no changes to her past medical history, surgical history, family history, or social history.    Review of Systems  Constitutional: Negative.  Negative for chills, fever, malaise/fatigue and weight loss.  HENT: Negative for congestion, ear discharge, ear pain and sinus pain.   Eyes: Negative for pain, discharge and redness.  Respiratory: Negative for cough, sputum production, shortness of breath and wheezing.   Cardiovascular: Negative.  Negative for chest pain and palpitations.  Gastrointestinal: Negative for abdominal pain, constipation, diarrhea, heartburn, nausea and vomiting.  Skin: Positive for itching and rash.  Neurological: Negative for dizziness and headaches.  Endo/Heme/Allergies: Positive for environmental allergies. Does not bruise/bleed easily.       Objective:   Blood pressure 140/88, pulse 80, temperature 98.4 F (36.9 C), temperature source Temporal, resp. rate 16, SpO2 98 %. There is no height or weight on file to calculate BMI.   Physical Exam:  Physical Exam Constitutional:      Appearance: She is well-developed.     Comments: Very talkative.  HENT:     Head: Normocephalic and atraumatic.     Right Ear: Tympanic membrane, ear canal and external ear normal.     Left Ear: Tympanic membrane, ear canal and external ear normal.     Nose: No nasal deformity, septal deviation, mucosal edema or rhinorrhea.     Right Turbinates: Enlarged and swollen.     Left Turbinates: Enlarged and swollen.     Right Sinus: No maxillary sinus tenderness or frontal sinus tenderness.     Left Sinus: No maxillary sinus tenderness or frontal sinus tenderness.     Mouth/Throat:     Mouth: Mucous membranes are not pale and not dry.     Pharynx: Uvula midline.  Eyes:     General:        Right eye: No discharge.        Left eye: No discharge.     Conjunctiva/sclera:  Conjunctivae normal.     Right eye: Right conjunctiva is not injected. No chemosis.    Left eye: Left conjunctiva is not injected. No chemosis.    Pupils: Pupils are equal, round, and reactive to light.  Cardiovascular:     Rate and Rhythm: Normal rate and regular rhythm.     Heart sounds: Normal heart sounds.  Pulmonary:     Effort: Pulmonary effort is normal. No tachypnea, accessory muscle usage or respiratory distress.     Breath sounds: Normal breath sounds. No wheezing, rhonchi or rales.     Comments: Moving air well in all lung fields. Chest:     Chest wall: No tenderness.  Lymphadenopathy:     Cervical: No cervical adenopathy.  Skin:    General: Skin is warm.     Capillary Refill: Capillary refill takes less than 2 seconds.     Coloration: Skin  is not pale.     Findings: No abrasion, erythema, petechiae or rash. Rash is not papular, urticarial or vesicular.     Comments: No overt urticaria today.  She does have excoriations on her bilateral arms.  Neurological:     Mental Status: She is alert.  Psychiatric:        Behavior: Behavior is cooperative.      Diagnostic studies: none       Salvatore Marvel, MD  [facility]

## 2020-06-03 ENCOUNTER — Other Ambulatory Visit: Payer: Self-pay

## 2020-06-03 ENCOUNTER — Ambulatory Visit: Payer: Medicare HMO | Admitting: Internal Medicine

## 2020-06-03 ENCOUNTER — Encounter: Payer: Self-pay | Admitting: Internal Medicine

## 2020-06-03 VITALS — BP 178/121 | HR 80 | Resp 16 | Ht 61.0 in | Wt 252.0 lb

## 2020-06-03 DIAGNOSIS — L508 Other urticaria: Secondary | ICD-10-CM | POA: Diagnosis not present

## 2020-06-03 DIAGNOSIS — M79605 Pain in left leg: Secondary | ICD-10-CM | POA: Diagnosis not present

## 2020-06-03 DIAGNOSIS — M79604 Pain in right leg: Secondary | ICD-10-CM

## 2020-06-03 DIAGNOSIS — R768 Other specified abnormal immunological findings in serum: Secondary | ICD-10-CM

## 2020-06-03 DIAGNOSIS — I10 Essential (primary) hypertension: Secondary | ICD-10-CM

## 2020-06-03 NOTE — Progress Notes (Signed)
Office Visit Note  Patient: Jennifer Davila             Date of Birth: [birth year]           MRN: [id number]             PCP: Martinique, [name], MD Referring: Valentina Shaggy, * Visit Date: 06/03/2020  Subjective:  New Patient (Initial Visit) (Abnormal labs)   History of Present Illness: Jennifer Davila is a 64 y.o. female here for evaluation of positive ANA checked during evaluation of urticaria as well as leg pain and swelling.  She started developing wheals or hives on the face since around July of this year.  She recalls this starting with swelling and rash on her face after waking up one morning while visiting in [place] at that time.  She denies any prior issues with significant rashes or chronic hives before that time.  These were itchy in character they resolved without leaving bruising or scarring although she still indicates a few small bumps on her face where there had been hives.  Besides that she also complains of increasing symptoms of swelling in her legs at the ankle and part way up the shin with intermittent pain around that area.  She describes the swelling as being worse late in the day after a long time on her legs.  She has not noticed any overlying skin changes or rashes but states the skin feels tight or like there are lumps underneath it especially on the lateral right leg.  The swelling decreases overnight and first thing in the morning.  She sometimes has more diffuse joint pain but typically does not notice swelling anywhere else.  She says she saw a vein specialist for evaluation who did not indicate any specific problems on their findings.  She was evaluated in allergy asthma clinic with testing that included significantly elevated inflammatory markers as well as positive ANA and RNP antibodies. She does have a history of prior laparoscopic banding procedure.  She denies any alopecia, oral ulcers, Raynaud's phenomenon, pleurisy, or history of blood  clots.  Labs reviewed 03/2020 CBC Hgb normal MCV 75  CRP 31 ESR 72 ANA positive RNP 6.1  Activities of Daily Living:  Patient reports morning stiffness for  none.   Patient Reports nocturnal pain.  Difficulty dressing/grooming: Denies Difficulty climbing stairs: Reports Difficulty getting out of chair: Denies Difficulty using hands for taps, buttons, cutlery, and/or writing: Reports  Review of Systems  Constitutional: Negative for fatigue.  HENT: Positive for mouth dryness.   Eyes: Negative for dryness.  Respiratory: Negative for shortness of breath.   Cardiovascular: Positive for swelling in legs/feet.  Gastrointestinal: Negative for constipation.  Endocrine: Positive for cold intolerance.  Genitourinary: Negative for difficulty urinating.  Musculoskeletal: Positive for joint swelling.  Skin: Positive for rash.  Allergic/Immunologic: Negative for susceptible to infections.  Neurological: Negative for numbness.  Hematological: Positive for bruising/bleeding tendency.  Psychiatric/Behavioral: Positive for sleep disturbance.    PMFS History:  Patient Active Problem List   Diagnosis Date Noted  . Bilateral leg pain 06/03/2020  . Chronic urticaria 05/26/2020  . Seasonal and perennial allergic rhinitis 05/26/2020  . Positive ANA (antinuclear antibody) 05/26/2020  . Iron deficiency anemia 07/07/2018  . Hematochezia 06/26/2018  . Diverticulosis of colon with hemorrhage   . Leg pain 01/06/2018  . Bilateral lower extremity edema 01/06/2018  . Lower GI bleed 01/06/2018  . Hypokalemia 01/06/2018  . Constipation 01/06/2018  . Leg cramping  10/18/2017  . Vitamin D deficiency, unspecified 10/18/2017  . GERD (gastroesophageal reflux disease) 10/18/2017  . Hypertension, essential, benign 09/04/2017  . Hyperlipidemia 09/04/2017  . Morbid obesity (HCC) 09/04/2017  . Vertigo 09/04/2017  . Hip pain 05/25/2015    Past Medical History:  Diagnosis Date  . Arthritis   .  Constipation    on stool softener  . GERD (gastroesophageal reflux disease)   . Hyperlipidemia   . Hypertension   . Trouble swallowing 2013   following lap band  . Urticaria     Family History  Problem Relation Age of Onset  . Hypertension Mother   . Stroke Mother   . Diabetes Father   . Alcohol abuse Father   . Kidney disease Father   . Colon polyps Brother   . Cancer Brother   . Alcohol abuse Brother    Past Surgical History:  Procedure Laterality Date  . ABDOMINAL HYSTERECTOMY    . CHOLECYSTECTOMY    . COLONOSCOPY N/A 01/08/2018   Procedure: COLONOSCOPY;  Surgeon: Armbruster, Steven P, MD;  Location: [facility];  Service: Gastroenterology;  Laterality: N/A;  . LAPAROSCOPIC GASTRIC BANDING    . LAPAROSCOPIC REPAIR AND REMOVAL OF GASTRIC BAND  2013-2014   Social History   Social History Narrative  . Not on file   Immunization History  Administered Date(s) Administered  . Hepatitis B, adult 10/13/2019, 11/17/2019, 05/18/2020  . Influenza, High Dose Seasonal PF 04/29/2019  . Influenza,inj,Quad PF,6+ Mos 05/23/2015, 05/03/2016, 04/23/2018  . Moderna SARS-COVID-2 Vaccination 02/05/2020  . PPD Test 05/14/2018  . Pneumococcal Conjugate-13 08/04/2015  . Pneumococcal Polysaccharide-23 08/26/2019  . Tdap 05/23/2015     Objective: Vital Signs: BP (!) 178/121 (BP Location: Right Arm, Patient Position: Sitting, Cuff Size: Normal)   Pulse 80   Resp 16   Ht 5' 1" (1.549 m)   Wt 252 lb (114.3 kg)   BMI 47.61 kg/m    Physical Exam Constitutional:      Appearance: She is obese.  HENT:     Head: Normocephalic.     Right Ear: External ear normal.     Left Ear: External ear normal.     Mouth/Throat:     Mouth: Mucous membranes are moist.     Pharynx: Oropharynx is clear.  Eyes:     Conjunctiva/sclera: Conjunctivae normal.  Cardiovascular:     Rate and Rhythm: Normal rate and regular rhythm.  Pulmonary:     Effort: Pulmonary effort is normal.     Breath sounds:  Normal breath sounds.  Skin:    General: Skin is warm and dry.     Findings: No rash.  Neurological:     General: No focal deficit present.     Mental Status: She is alert.     Musculoskeletal Exam:  Neck full range of motion no tenderness Shoulder, elbow, wrist, fingers full range of motion no tenderness or swelling No paraspinal tenderness to palpation over upper and lower back Normal hip internal and external rotation without pain, no tenderness to lateral hip palpation Knees, ankles, MTPs full range of motion no tenderness or swelling Mild nonpitting edema surrounding ankles less than halfway up shin bilaterally with no overlying skin changes and no tenderness  CDAI Exam: CDAI Score: -- Patient Global: --; Provider Global: -- Swollen: --; Tender: -- Joint Exam 06/03/2020   No joint exam has been documented for this visit   There is currently no information documented on the homunculus. Go to the Rheumatology activity and   complete the homunculus joint exam.  Investigation: No additional findings.  Imaging: No results found.  Recent Labs: Lab Results  Component Value Date   WBC 5.1 04/19/2020   HGB 12.6 04/19/2020   PLT 296 04/06/2020   NA 139 04/19/2020   K 3.8 04/19/2020   CL 97 04/19/2020   CO2 27 04/19/2020   GLUCOSE 105 (H) 04/19/2020   BUN 12 04/19/2020   CREATININE 1.00 04/19/2020   BILITOT 0.3 04/19/2020   ALKPHOS 112 04/19/2020   AST 17 04/19/2020   ALT 6 04/19/2020   PROT 7.3 04/19/2020   ALBUMIN 4.6 04/19/2020   CALCIUM 9.6 04/19/2020   GFRAA 68 04/19/2020    Speciality Comments: No specialty comments available.  Procedures:  No procedures performed Allergies: Patient has no known allergies.   Assessment / Plan:     Visit Diagnoses: Positive ANA (antinuclear antibody)  I do not see any clear clinical manifestations of systemic lupus or other autoimmune disease at this time.  The swelling and pain in her distal legs is not localized over  the joints and seems more like edematous changes.  RNP is mostly associated with mixed connective tissue disease which clinic include myositis and she is describing some muscle aches despite no weakness on exam today we will check CK and aldolase in case some low-grade inflammation is present.  Chronic urticaria  No skin rash today and symptoms cleared spontaneously without residual changes so probably not a cutaneous vasculitis.  Autoimmune disease can be associated with urticaria but would not do additional work-up without other systemic manifestations.  Bilateral leg pain - Plan: CK, Aldolase, Sedimentation rate  Checking for muscle inflammation with CK and aldolase.  The only abnormality on exam is more like peripheral edema. She states she has had normal vein evaluation and is also being seen alongside reportedly a several pound sudden weight gain so recommended she could consider work-up such as 2D echocardiography to rule out diastolic dysfunction or right-sided heart failure as a cause of the leg swelling.  Hypertension, essential, benign  Her hypertension is uncontrolled today with blood pressure checked on multiple readings 169-180/102-121 she has previously had controlled hypertension.  She has no changes such as headache or neurological complaints so do not recommend any need for emergent evaluation.  I did recommend she contact her primary care office to follow-up with this significantly elevated blood pressure today.  Orders: Orders Placed This Encounter  Procedures  . CK  . Aldolase  . Sedimentation rate   No orders of the defined types were placed in this encounter.   Follow-Up Instructions: No follow-ups on file.   Christopher W Rice, MD  Note - This record has been created using Dragon software.  Chart creation errors have been sought, but may not always  have been located. Such creation errors do not reflect on  the standard of medical care.  

## 2020-06-03 NOTE — Patient Instructions (Signed)
I do not see clear evidence of an autoimmune disease related to your positive antibody test at this time. We will check labs to look for evidence of muscle inflammation base don your symptoms and for systemic inflammation. Your leg swelling does not look typical for these kinds of autoimmune diseases. This can be related to fluid retention from vein or heart causes that collects to the legs due to gravity. We will contact you after seeing results and discuss if follow up is needed.   Antinuclear Antibody Test Why am I having this test? This is a test that is used to help diagnose systemic lupus erythematosus (SLE) and other autoimmune diseases. An autoimmune disease is a disease in which the body's own defense (immune)system attacks its organs. What is being tested? This test checks for antinuclear antibodies (ANA) in the blood. The presence of ANA is associated with several autoimmune diseases. It is seen in almost all patients with lupus. What kind of sample is taken?  A blood sample is required for this test. It is usually collected by inserting a needle into a blood vessel. How are the results reported? Your test results will be reported as either positive or negative. A false-positive result can occur. A false positive is incorrect because it means that a condition is present when it is not. What do the results mean? A positive test result may mean that you have:  Lupus.  Other autoimmune diseases, such as rheumatoid arthritis, scleroderma, or Sjgren syndrome. Conditions that may cause a false-positive result include:  Liver dysfunction.  Myasthenia gravis.  Infectious mononucleosis. Talk with your health care provider about what your results mean. Questions to ask your health care provider Ask your health care provider, or the department that is doing the test:  When will my results be ready?  How will I get my results?  What are my treatment options?  What other tests do  I need?  What are my next steps? Summary  This is a test that is used to help diagnose systemic lupus erythematosus (SLE) and other autoimmune diseases. An autoimmune disease is a disease in which the body's own defense (immune)system attacks the body.  This test checks for antinuclear antibodies (ANA) in the blood. The presence of ANA is associated with several autoimmune diseases. It is seen in almost all patients with lupus.  Your test results will be reported as either positive or negative. Talk with your health care provider about what your results mean. This information is not intended to replace advice given to you by your health care provider. Make sure you discuss any questions you have with your health care provider. Document Revised: 06/28/2017 Document Reviewed: 03/14/2017 Elsevier Patient Education  [place].

## 2020-06-07 DIAGNOSIS — M79605 Pain in left leg: Secondary | ICD-10-CM | POA: Diagnosis not present

## 2020-06-07 DIAGNOSIS — M79604 Pain in right leg: Secondary | ICD-10-CM | POA: Diagnosis not present

## 2020-06-08 LAB — CK: Total CK: 221 U/L — ABNORMAL HIGH (ref 29–143)

## 2020-06-08 LAB — SEDIMENTATION RATE: Sed Rate: 33 mm/h — ABNORMAL HIGH (ref 0–30)

## 2020-06-08 LAB — ALDOLASE: Aldolase: 4.6 U/L (ref <=8.1)

## 2020-06-13 NOTE — Progress Notes (Signed)
Sedimentation rate that was significantly elevated has returned to a normal value. CK is slightly above normal. I would not label her as having a mixed connective tissue disease at this time. Chronic urticaria associated with positive ANA even without specific disease can be more antihistamine resistant. Also encourage her to keep upcoming appointment with PCP for increased hypertension, swelling.

## 2020-06-15 ENCOUNTER — Ambulatory Visit (INDEPENDENT_AMBULATORY_CARE_PROVIDER_SITE_OTHER): Payer: Medicare HMO | Admitting: Family Medicine

## 2020-06-15 ENCOUNTER — Encounter: Payer: Self-pay | Admitting: Family Medicine

## 2020-06-15 ENCOUNTER — Other Ambulatory Visit: Payer: Self-pay

## 2020-06-15 VITALS — BP 165/90 | HR 78 | Temp 98.5°F | Resp 16 | Ht 61.0 in | Wt 246.8 lb

## 2020-06-15 DIAGNOSIS — M79604 Pain in right leg: Secondary | ICD-10-CM

## 2020-06-15 DIAGNOSIS — E876 Hypokalemia: Secondary | ICD-10-CM | POA: Diagnosis not present

## 2020-06-15 DIAGNOSIS — M79605 Pain in left leg: Secondary | ICD-10-CM

## 2020-06-15 DIAGNOSIS — M545 Low back pain, unspecified: Secondary | ICD-10-CM | POA: Diagnosis not present

## 2020-06-15 DIAGNOSIS — Z23 Encounter for immunization: Secondary | ICD-10-CM

## 2020-06-15 DIAGNOSIS — I10 Essential (primary) hypertension: Secondary | ICD-10-CM

## 2020-06-15 MED ORDER — LOSARTAN POTASSIUM-HCTZ 100-25 MG PO TABS: 1.0000 | ORAL_TABLET | Freq: Every day | ORAL | 1 refills | Status: DC

## 2020-06-15 MED ORDER — IBUPROFEN 400 MG PO TABS: 400.0000 mg | ORAL_TABLET | Freq: Every day | ORAL | 1 refills | Status: DC | PRN

## 2020-06-15 MED ORDER — AMLODIPINE BESYLATE 5 MG PO TABS: 5.0000 mg | ORAL_TABLET | Freq: Every day | ORAL | 1 refills | Status: DC

## 2020-06-15 NOTE — Patient Instructions (Addendum)
A few things to remember from today's visit:   Hypertension, essential, benign  Left low back pain, unspecified chronicity, unspecified whether sciatica present - Plan: Urinalysis, Routine w reflex microscopic  Bilateral leg pain  If you need refills please call your pharmacy. Do not use My Chart to request refills or for acute issues that need immediate attention.   I think back pain is caused by muscles. Today Amlodipine was added to take a bedtime. Motrin 400 mg church days, take blood pressure before taking it. Monitor for bleeding.  Continue working on wt loss.  Hydrochlorothiazide and losartan in one tab.   Please be sure medication list is accurate. If a new problem present, please set up appointment sooner than planned today.

## 2020-06-15 NOTE — Progress Notes (Signed)
Chief Complaint  Patient presents with  . Follow-up   HPI: Jennifer Davila is a 64 y.o. female, who is here today to follow on recent visit with rheumatologist,she would like to go through the notes and lab results. Evaluated by rheumatologist, Dr Valora Piccolo 06/03/20. She was referred by immunologist because work up for urticaria showed positive ANA. After evaluation it was determined that she does not have lupus or connective tissue disease.  She is also concerned because elevated BP. Her BP was elevated when she was elevated at 178/121. She had headache and "kidney " pain. Headache lasted a day,resolved. Negative for CP.SOB,palpitation,or worsening LE edema.  She is on Losartan 100 mg and HCTZ 25 mg daily.  HypoK+: She is on KLOR 20 meq daily, still receiving 10 meq from her pharmacy, so she is taking 2 tabs daily.  Lab Results  Component Value Date   CREATININE 1.00 04/19/2020   BUN 12 04/19/2020   NA 139 04/19/2020   K 3.8 04/19/2020   CL 97 04/19/2020   CO2 27 04/19/2020    Left lower back pain for the past week or so. No prior Hx. Pain is not radiated. She has not noted saddle anesthesia or bladder/bowel dysfunction.  No abdominal pain,N/V,and no changes in bowel habits. Negative for dysuria,gross hematuria,and decreased urine  She is still trying to eat healthier and still losing wt. Decreased dressing.  She has been consistent with following a healthier diet. She is also exercising on the [place]. She has lost some wt since her last visit. She is not longer having SOB when bending down.  Long Hx of LE pain R>L, which has been stable. Recently evaluated by vascular, compression stocking recommended. She has tried Gabapentin,Cymbalta,and opioid medications. The "only" medication that has helped is Ibuprofen 800 mg.  Negative for erythema or cyanosis. 01/23/2018 Lumbar MRI showed L4-5: Bilateral facet arthropathy with edema. Anterolisthesis of 3 mm that  could worsen with standing or flexion. Bulging of the disc.Canal and foraminal stenosis that could be symptomatic. The facet arthropathy could also be a cause of back pain or referred facet syndrome pain. This appearance could worsen with standing or flexion. L5-S1: Facet osteoarthritis right more than left, which similarly could be symptomatic. Mild right foraminal narrowing without definite neural compression. 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 She was evaluated by neurosurgeon.  Ibuprofen was discontinued because recurrent GI bleeding She would like to take ibuprofen Sundays, when she goes to church.  Lab Results  Component Value Date   CREATININE 1.00 04/19/2020   BUN 12 04/19/2020   NA 139 04/19/2020   K 3.8 04/19/2020   CL 97 04/19/2020   CO2 27 04/19/2020   Review of Systems  Constitutional: Negative for activity change, appetite change, fatigue and fever.  HENT: Negative for mouth sores, nosebleeds and trouble swallowing.   Eyes: Negative for redness and visual disturbance.  Respiratory: Negative for cough and wheezing.   Neurological: Negative for syncope, facial asymmetry and weakness.  Psychiatric/Behavioral: Negative for confusion. The patient is nervous/anxious.   Rest see pertinent positives and negatives per HPI.  Current Outpatient Medications on File Prior to Visit  Medication Sig Dispense Refill  . acetaminophen (TYLENOL) 500 MG tablet Take 1-2 tablets (500-1,000 mg total) by mouth daily as needed for moderate pain (pain). 60 tablet 1  . cetirizine (ZYRTEC) 10 MG tablet Take 1 tablet (10 mg total) by mouth 2 (two) times daily.  60 tablet 5  . esomeprazole (NEXIUM) 40 MG capsule Take 1 capsule (40 mg total) by mouth daily. 90 capsule 3  . montelukast (SINGULAIR) 10 MG tablet Take 1 tablet (10 mg total) by mouth at bedtime. 30 tablet 5  . potassium chloride SA (KLOR-CON M20) 20 MEQ tablet Take 1 tablet  (20 mEq total) by mouth daily. 90 tablet 1  . pravastatin (PRAVACHOL) 20 MG tablet Take 1 tablet (20 mg total) by mouth daily. 90 tablet 3   No current facility-administered medications on file prior to visit.   Past Medical History:  Diagnosis Date  . Arthritis   . Constipation    on stool softener  . GERD (gastroesophageal reflux disease)   . Hyperlipidemia   . Hypertension   . Trouble swallowing 2013   following lap band  . Urticaria    No Known Allergies  Social History   Socioeconomic History  . Marital status: Married    Spouse name: Not on file  . Number of children: 1  . Years of education: Not on file  . Highest education level: Not on file  Occupational History  . Not on file  Tobacco Use  . Smoking status: Former Smoker    Types: Cigarettes    Quit date: 10/02/1972    Years since quitting: 47.7  . Smokeless tobacco: Never Used  Vaping Use  . Vaping Use: Never used  Substance and Sexual Activity  . Alcohol use: No  . Drug use: No  . Sexual activity: Never  Other Topics Concern  . Not on file  Social History Narrative  . Not on file   Social Determinants of Health   Financial Resource Strain:   . Difficulty of Paying Living Expenses: Not on file  Food Insecurity:   . Worried About Charity fundraiser in the Last Year: Not on file  . Ran Out of Food in the Last Year: Not on file  Transportation Needs:   . Lack of Transportation (Medical): Not on file  . Lack of Transportation (Non-Medical): Not on file  Physical Activity:   . Days of Exercise per Week: Not on file  . Minutes of Exercise per Session: Not on file  Stress:   . Feeling of Stress : Not on file  Social Connections:   . Frequency of Communication with Friends and Family: Not on file  . Frequency of Social Gatherings with Friends and Family: Not on file  . Attends Religious Services: Not on file  . Active Member of Clubs or Organizations: Not on file  . Attends Archivist  Meetings: Not on file  . Marital Status: Not on file    Vitals:   06/15/20 1525 06/15/20 1526  BP: (!) 160/100 (!) 165/90  Pulse:    Resp:    Temp:    SpO2:     Wt Readings from Last 3 Encounters:  06/15/20 246 lb 12.8 oz (111.9 kg)  06/03/20 252 lb (114.3 kg)  05/18/20 246 lb (111.6 kg)   Body mass index is 46.63 kg/m.  Physical Exam Vitals and nursing note reviewed.  Constitutional:      General: She is not in acute distress.    Appearance: She is well-developed.  HENT:     Head: Normocephalic and atraumatic.  Eyes:     Conjunctiva/sclera: Conjunctivae normal.     Pupils: Pupils are equal, round, and reactive to light.  Cardiovascular:     Rate and Rhythm: Normal rate and regular  rhythm.     Pulses:          Dorsalis pedis pulses are 2+ on the right side and 2+ on the left side.     Heart sounds: No murmur heard.      Comments: Trace pitting LE edema, bilateral. Pulmonary:     Effort: Pulmonary effort is normal. No respiratory distress.     Breath sounds: Normal breath sounds.  Abdominal:     Palpations: Abdomen is soft. There is no hepatomegaly or mass.     Tenderness: There is no abdominal tenderness.  Lymphadenopathy:     Cervical: No cervical adenopathy.  Skin:    General: Skin is warm.     Findings: No erythema or rash.  Neurological:     Mental Status: She is alert and oriented to person, place, and time.     Cranial Nerves: No cranial nerve deficit.     Gait: Gait normal.  Psychiatric:     Comments: Well groomed, good eye contact.   ASSESSMENT AND PLAN:  Jennifer Davila was seen today for follow-up.  Diagnoses and all orders for this visit:  Bilateral leg pain Chronic. ? Radiculopathy.  We discussed side effects of NSAID's. She has not had GI bleed in month and she understands the risk of recurrence. I recommend trying lower Motrin dose, 400 mg instead 800 mg.  -     ibuprofen (ADVIL) 400 MG tablet; Take 1 tablet (400 mg total) by mouth daily  as needed.  Hypertension, essential, benign Re-checked and elevated, not adequately controlled. NSAID's CV side effects discussed. Amlodipine added today. HCTZ and Losartan changes to combination tab.  -     losartan-hydrochlorothiazide (HYZAAR) 100-25 MG tablet; Take 1 tablet by mouth daily. -     amLODipine (NORVASC) 5 MG tablet; Take 1 tablet (5 mg total) by mouth at bedtime.  Left low back pain, unspecified chronicity, unspecified whether sciatica present Musculoskeletal most likely. Improving. I do not thin imaging is needed at this time.  Morbid obesity ([place]) We discussed benefits of wt loss as well as adverse effects of obesity. Consistency with healthy diet and physical activity recommended. Weight Watchers is a good option as well as daily brisk walking for 15-30 min as tolerated.  Hypokalemia Continue KLOR 20 meq daily, she has Rx at her pharmacy. Side effects of HCTZ discussed.  Need for influenza vaccination -     Flu Vaccine QUAD High Dose(Fluad)   Spent 61 minutes with pt.  During this time history was obtained and documented, examination was performed, prior labs/imaging reviewed, and assessment/plan discussed.   Return in about 6 weeks (around 07/27/2020).   Megann Easterwood G. Martinique, MD  [facility]. [place] office.  A few things to remember from today's visit:   Hypertension, essential, benign  Left low back pain, unspecified chronicity, unspecified whether sciatica present - Plan: Urinalysis, Routine w reflex microscopic  Bilateral leg pain  If you need refills please call your pharmacy. Do not use My Chart to request refills or for acute issues that need immediate attention.   I think back pain is caused by muscles. Today Amlodipine was added to take a bedtime. Motrin 400 mg church days, take blood pressure before taking it. Monitor for bleeding.  Continue working on wt loss.  Hydrochlorothiazide and losartan in one tab.   Please be  sure medication list is accurate. If a new problem present, please set up appointment sooner than planned today.

## 2020-06-16 DIAGNOSIS — M545 Low back pain, unspecified: Secondary | ICD-10-CM | POA: Diagnosis not present

## 2020-06-17 LAB — URINALYSIS, ROUTINE W REFLEX MICROSCOPIC
Bilirubin Urine: NEGATIVE
Glucose, UA: NEGATIVE
Hgb urine dipstick: NEGATIVE
Hyaline Cast: NONE SEEN /LPF
Ketones, ur: NEGATIVE
Leukocytes,Ua: NEGATIVE
Nitrite: NEGATIVE
Protein, ur: NEGATIVE
RBC / HPF: NONE SEEN /HPF (ref 0–2)
Specific Gravity, Urine: 1.009 (ref 1.001–1.03)
WBC, UA: NONE SEEN /HPF (ref 0–5)
pH: 7 (ref 5.0–8.0)

## 2020-07-15 ENCOUNTER — Telehealth: Payer: Self-pay | Admitting: Family Medicine

## 2020-07-15 NOTE — Progress Notes (Signed)
  Chronic Care Management   Outreach Note  07/15/2020 Name: Jennifer Davila MRN: [id number] DOB: July 20, 1956  Referred by: Martinique, [name], MD Reason for referral : No chief complaint on file.   Third unsuccessful telephone outreach was attempted today. The patient was referred to the pharmacist for assistance with care management and care coordination.   Follow Up Plan:   Carley Perdue [employer] Scheduler

## 2020-07-20 ENCOUNTER — Ambulatory Visit: Payer: Medicare HMO | Admitting: Family Medicine

## 2020-08-09 ENCOUNTER — Other Ambulatory Visit: Payer: Medicare HMO

## 2020-08-09 ENCOUNTER — Other Ambulatory Visit: Payer: Self-pay

## 2020-08-09 DIAGNOSIS — Z20822 Contact with and (suspected) exposure to covid-19: Secondary | ICD-10-CM

## 2020-08-13 LAB — NOVEL CORONAVIRUS, NAA: SARS-CoV-2, NAA: NOT DETECTED

## 2020-08-29 ENCOUNTER — Other Ambulatory Visit: Payer: Self-pay | Admitting: Family Medicine

## 2020-08-29 DIAGNOSIS — K219 Gastro-esophageal reflux disease without esophagitis: Secondary | ICD-10-CM

## 2020-08-29 DIAGNOSIS — M79604 Pain in right leg: Secondary | ICD-10-CM

## 2020-09-12 ENCOUNTER — Other Ambulatory Visit: Payer: Self-pay | Admitting: Family Medicine

## 2020-09-12 DIAGNOSIS — Z1231 Encounter for screening mammogram for malignant neoplasm of breast: Secondary | ICD-10-CM

## 2020-09-20 ENCOUNTER — Other Ambulatory Visit: Payer: Self-pay

## 2020-09-21 ENCOUNTER — Encounter: Payer: Self-pay | Admitting: Family Medicine

## 2020-09-21 ENCOUNTER — Ambulatory Visit (INDEPENDENT_AMBULATORY_CARE_PROVIDER_SITE_OTHER): Payer: Medicare HMO | Admitting: Family Medicine

## 2020-09-21 ENCOUNTER — Other Ambulatory Visit: Payer: Self-pay | Admitting: Family Medicine

## 2020-09-21 VITALS — BP 128/70 | HR 86 | Resp 16 | Ht 61.0 in | Wt 251.0 lb

## 2020-09-21 DIAGNOSIS — R7303 Prediabetes: Secondary | ICD-10-CM

## 2020-09-21 DIAGNOSIS — M79605 Pain in left leg: Secondary | ICD-10-CM

## 2020-09-21 DIAGNOSIS — I10 Essential (primary) hypertension: Secondary | ICD-10-CM

## 2020-09-21 DIAGNOSIS — D509 Iron deficiency anemia, unspecified: Secondary | ICD-10-CM | POA: Diagnosis not present

## 2020-09-21 DIAGNOSIS — R252 Cramp and spasm: Secondary | ICD-10-CM

## 2020-09-21 DIAGNOSIS — M79604 Pain in right leg: Secondary | ICD-10-CM

## 2020-09-21 DIAGNOSIS — E876 Hypokalemia: Secondary | ICD-10-CM | POA: Diagnosis not present

## 2020-09-21 DIAGNOSIS — E785 Hyperlipidemia, unspecified: Secondary | ICD-10-CM

## 2020-09-21 LAB — CBC
HCT: 39 % (ref 36.0–46.0)
Hemoglobin: 12.2 g/dL (ref 12.0–15.0)
MCHC: 31.4 g/dL (ref 30.0–36.0)
MCV: 73.5 fl — ABNORMAL LOW (ref 78.0–100.0)
Platelets: 311 10*3/uL (ref 150.0–400.0)
RBC: 5.3 Mil/uL — ABNORMAL HIGH (ref 3.87–5.11)
RDW: 14.1 % (ref 11.5–15.5)
WBC: 6.9 10*3/uL (ref 4.0–10.5)

## 2020-09-21 LAB — LIPID PANEL
Cholesterol: 185 mg/dL (ref 0–200)
HDL: 41.9 mg/dL (ref >39.00)
LDL Cholesterol: 110 mg/dL — ABNORMAL HIGH (ref 0–99)
NonHDL: 143.5
Total CHOL/HDL Ratio: 4
Triglycerides: 167 mg/dL — ABNORMAL HIGH (ref 0.0–149.0)
VLDL: 33.4 mg/dL (ref 0.0–40.0)

## 2020-09-21 LAB — BASIC METABOLIC PANEL
BUN: 12 mg/dL (ref 6–23)
CO2: 33 mEq/L — ABNORMAL HIGH (ref 19–32)
Calcium: 9.4 mg/dL (ref 8.4–10.5)
Chloride: 98 mEq/L (ref 96–112)
Creatinine, Ser: 0.97 mg/dL (ref 0.40–1.20)
GFR: 60.72 mL/min (ref >60.00)
Glucose, Bld: 98 mg/dL (ref 70–99)
Potassium: 3.3 mEq/L — ABNORMAL LOW (ref 3.5–5.1)
Sodium: 139 mEq/L (ref 135–145)

## 2020-09-21 LAB — HEMOGLOBIN A1C: Hgb A1c MFr Bld: 6.4 % (ref 4.6–6.5)

## 2020-09-21 MED ORDER — IBUPROFEN 400 MG PO TABS: 400.0000 mg | ORAL_TABLET | Freq: Every day | ORAL | 1 refills | Status: DC | PRN

## 2020-09-21 MED ORDER — TIZANIDINE HCL 4 MG PO TABS: 4.0000 mg | ORAL_TABLET | Freq: Two times a day (BID) | ORAL | 3 refills | Status: DC | PRN

## 2020-09-21 NOTE — Assessment & Plan Note (Signed)
BP adequately controlled. Continue amlodipine 5 mg daily and losartan-HCTZ 100-25 mg daily. Continue monitoring BP regularly, ideally at home.

## 2020-09-21 NOTE — Assessment & Plan Note (Signed)
She understands the benefits of wt loss as well as adverse effects of obesity. Consistency with healthy diet and physical activity recommended.

## 2020-09-21 NOTE — Progress Notes (Signed)
HPI: Jennifer Norina Cowper is a 65 y.o. female, who is here today for 6 months follow up.   She was last seen on 06/15/20. No new problems since her last visit.  HTN: She is on Losartan-HCTZ 100-25 mg daily and Amlodipine 5 mg daily. SBP at [facility] 148. Negative for severe/frequent headache, visual changes, chest pain, dyspnea, palpitation,or focal weakness. HypoK+: She is on KLOR 20 meq daily.  Lab Results  Component Value Date   CREATININE 1.00 04/19/2020   BUN 12 04/19/2020   NA 139 04/19/2020   K 3.8 04/19/2020   CL 97 04/19/2020   CO2 27 04/19/2020   HLD: She is on Pravastatin 20 mg daily. Tolerating medication well.  Lab Results  Component Value Date   CHOL 196 08/26/2019   HDL 44.50 08/26/2019   LDLCALC 134 (H) 08/26/2019   TRIG 89.0 08/26/2019   CHOLHDL 4 08/26/2019   Still having intermittent episodes of LE cramps, mainly in calvas, very painfull. It seems to be worse after certain activities like dancing at church.  Posterior leg pain,pulling like sensation when walking. Edema, worse at the end of the day. Stable otherwise. Wearing compression stocking help. She has not noted ulcers or cyanosis.  Ice helps with cramps. LE cramps at night has improved with Zanaflex 4 mg, she doe snot wake up due to cramps. She does not take medication during the day because it causes drowsiness.  She has tried different medications including Gabapentin,cymbalta,and opioid medications unsuccessfully.  Motrin is the only medication that helps with pain. She takes medication Sundays, when she is more active due to church activities.  Hx of GI bleed. She has not noted blood in stool or melena. She is on not longer on iron supplementation.  Lab Results  Component Value Date   WBC 5.1 04/19/2020   HGB 12.6 04/19/2020   HCT 40.5 04/19/2020   MCV 75 (L) 04/19/2020   PLT 296 04/06/2020   Prediabetes:  Negative for polydipsia,polyuria, or polyphagia. She is  trying to follow a healthful diet, she gained some back. Her husband has had some health complications, so this has affected consistency. She does not exercise regularly.  Lab Results  Component Value Date   HGBA1C 6.0 (H) 04/06/2020   Review of Systems  Constitutional: Negative for activity change, appetite change, fatigue and fever.  HENT: Negative for mouth sores, nosebleeds, sore throat and trouble swallowing.   Respiratory: Negative for cough and wheezing.   Gastrointestinal: Negative for abdominal pain, nausea and vomiting.       Negative for changes in bowel habits.  Genitourinary: Negative for decreased urine volume, dysuria and hematuria.  Musculoskeletal: Positive for myalgias. Negative for back pain and gait problem.  Allergic/Immunologic: Positive for environmental allergies.  Neurological: Negative for syncope, facial asymmetry, weakness and numbness.  Rest of ROS, see pertinent positives sand negatives in HPI  Current Outpatient Medications on File Prior to Visit  Medication Sig Dispense Refill  . acetaminophen (TYLENOL) 500 MG tablet Take 1-2 tablets (500-1,000 mg total) by mouth daily as needed for moderate pain (pain). 60 tablet 1  . amLODipine (NORVASC) 5 MG tablet Take 1 tablet (5 mg total) by mouth at bedtime. 90 tablet 1  . cetirizine (ZYRTEC) 10 MG tablet Take 1 tablet (10 mg total) by mouth 2 (two) times daily. 60 tablet 5  . esomeprazole (NEXIUM) 40 MG capsule TAKE 1 CAPSULE BY MOUTH EVERY DAY 90 capsule 3  . montelukast (SINGULAIR) 10 MG tablet Take  1 tablet (10 mg total) by mouth at bedtime. 30 tablet 5  . potassium chloride SA (KLOR-CON M20) 20 MEQ tablet Take 1 tablet (20 mEq total) by mouth daily. 90 tablet 1  . pravastatin (PRAVACHOL) 20 MG tablet Take 1 tablet (20 mg total) by mouth daily. 90 tablet 3   No current facility-administered medications on file prior to visit.    Past Medical History:  Diagnosis Date  . Arthritis   . Constipation    on  stool softener  . GERD (gastroesophageal reflux disease)   . Hyperlipidemia   . Hypertension   . Trouble swallowing 2013   following lap band  . Urticaria    No Known Allergies  Social History   Socioeconomic History  . Marital status: Married    Spouse name: Not on file  . Number of children: 1  . Years of education: Not on file  . Highest education level: Not on file  Occupational History  . Not on file  Tobacco Use  . Smoking status: Former Smoker    Types: Cigarettes    Quit date: 10/02/1972    Years since quitting: 48.0  . Smokeless tobacco: Never Used  Vaping Use  . Vaping Use: Never used  Substance and Sexual Activity  . Alcohol use: No  . Drug use: No  . Sexual activity: Never  Other Topics Concern  . Not on file  Social History Narrative  . Not on file   Social Determinants of Health   Financial Resource Strain: Not on file  Food Insecurity: Not on file  Transportation Needs: Not on file  Physical Activity: Not on file  Stress: Not on file  Social Connections: Not on file    Vitals:   09/21/20 1113  BP: 128/70  Pulse: 86  Resp: 16  SpO2: 99%   Wt Readings from Last 3 Encounters:  09/21/20 251 lb (113.9 kg)  06/15/20 246 lb 12.8 oz (111.9 kg)  06/03/20 252 lb (114.3 kg)   Body mass index is 47.43 kg/m.  Physical Exam Vitals and nursing note reviewed.  Constitutional:      General: She is not in acute distress.    Appearance: She is well-developed.  HENT:     Head: Normocephalic and atraumatic.     Mouth/Throat:     Mouth: Oropharynx is clear and moist and mucous membranes are normal. Mucous membranes are moist.  Eyes:     Conjunctiva/sclera: Conjunctivae normal.  Cardiovascular:     Rate and Rhythm: Normal rate and regular rhythm.     Pulses:          Dorsalis pedis pulses are 2+ on the right side and 2+ on the left side.     Heart sounds: No murmur heard.     Comments: Trace periankle edema, bilateral. Pulmonary:     Effort:  Pulmonary effort is normal. No respiratory distress.     Breath sounds: Normal breath sounds.  Abdominal:     Palpations: Abdomen is soft. There is no hepatomegaly or mass.     Tenderness: There is no abdominal tenderness.  Musculoskeletal:     Right lower leg: Pitting Edema present.     Left lower leg: Pitting Edema present.     Comments: Pulling like pain with foot dorsi-flexion, bilateral.    Lymphadenopathy:     Cervical: No cervical adenopathy.  Skin:    General: Skin is warm.     Findings: No erythema or rash.  Neurological:  Mental Status: She is alert and oriented to person, place, and time.     Cranial Nerves: No cranial nerve deficit.     Gait: Gait normal.     Deep Tendon Reflexes: Strength normal.  Psychiatric:        Mood and Affect: Mood and affect normal.     Comments: Well groomed, good eye contact.   ASSESSMENT AND PLAN:  Ms. Tionna Davila was seen today for 6 months follow-up.  Orders Placed This Encounter  Procedures  . Hemoglobin A1c  . CBC  . Lipid panel  . Basic metabolic panel   Lab Results  Component Value Date   WBC 6.9 09/21/2020   HGB 12.2 09/21/2020   HCT 39.0 09/21/2020   MCV 73.5 (L) 09/21/2020   PLT 311.0 09/21/2020   Lab Results  Component Value Date   CREATININE 0.97 09/21/2020   BUN 12 09/21/2020   NA 139 09/21/2020   K 3.3 (L) 09/21/2020   CL 98 09/21/2020   CO2 33 (H) 09/21/2020   Lab Results  Component Value Date   HGBA1C 6.4 09/21/2020   Lab Results  Component Value Date   CHOL 185 09/21/2020   HDL 41.90 09/21/2020   LDLCALC 110 (H) 09/21/2020   TRIG 167.0 (H) 09/21/2020   CHOLHDL 4 09/21/2020    Bilateral leg pain Compression stocking has helped with pain and edema. Continue Motrin 400 mg daily as needed.       ? Hamstring tightness. Recommend home stretching exercises.  -     ibuprofen (ADVIL) 400 MG tablet; Take 1 tablet (400 mg total) by mouth daily as needed.  Leg cramping Continue  Zanaflex 4 mg at bedtime. Local ice. K+ supplementation has not helped.  -     tiZANidine (ZANAFLEX) 4 MG tablet; Take 1 tablet (4 mg total) by mouth 2 (two) times daily as needed.  Hypokalemia Continue KLOR 20 meq daily. Further recommendations according to BMP. Abdominal CT in 12/2017: Adrenal glands are unremarkable. Will consider adding Spironolactone and/or decreasing HCTZ.  Iron deficiency anemia, unspecified iron deficiency anemia type We dicussed some side effects of NSAID's. Instructed about warning signs.  Hyperlipidemia, unspecified hyperlipidemia type Pravastatin 20 mg to continue as well as low fat diet. Further recommendations according to FLP results.  Prediabetes A healthy life style encouraged for primary prevention. Further recommendations according to HgA1C result.  Hypertension, essential, benign BP adequately controlled. Continue amlodipine 5 mg daily and losartan-HCTZ 100-25 mg daily. Continue monitoring BP regularly, ideally at home.  Morbid obesity ([place]) She understands the benefits of wt loss as well as adverse effects of obesity. Consistency with healthy diet and physical activity recommended.   Spent 40 minutes.  During this time history was obtained and documented, examination was performed, prior labs/imaging reviewed, and assessment/plan discussed.  Return in about 5 months (around 02/18/2021).  Eira Alpert G. Martinique, MD  [facility] [place] office.   A few things to remember from today's visit:   Bilateral leg pain - Plan: ibuprofen (ADVIL) 400 MG tablet  Morbid obesity (HCC)  Hypertension, essential, benign - Plan: Basic metabolic panel  Leg cramping  Iron deficiency anemia, unspecified iron deficiency anemia type - Plan: CBC  Hyperlipidemia, unspecified hyperlipidemia type - Plan: Lipid panel  Prediabetes - Plan: Hemoglobin A1c  If you need refills please call your pharmacy. Do not use My Chart to request refills or  for acute issues that need immediate attention.   Continue Zanaflex at bedtime. Hamstring exercise  may help. Continue Motrin 400 mg a few times per week, monitor blood pressure and stool.  Please be sure medication list is accurate. If a new problem present, please set up appointment sooner than planned today.

## 2020-09-21 NOTE — Patient Instructions (Signed)
A few things to remember from today's visit:   Bilateral leg pain - Plan: ibuprofen (ADVIL) 400 MG tablet  Morbid obesity (HCC)  Hypertension, essential, benign - Plan: Basic metabolic panel  Leg cramping  Iron deficiency anemia, unspecified iron deficiency anemia type - Plan: CBC  Hyperlipidemia, unspecified hyperlipidemia type - Plan: Lipid panel  Prediabetes - Plan: Hemoglobin A1c  If you need refills please call your pharmacy. Do not use My Chart to request refills or for acute issues that need immediate attention.   Continue Zanaflex at bedtime. Hamstring exercise may help. Continue Motrin 400 mg a few times per week, monitor blood pressure and stool.  Please be sure medication list is accurate. If a new problem present, please set up appointment sooner than planned today.

## 2020-09-22 MED ORDER — PRAVASTATIN SODIUM 20 MG PO TABS: 20.0000 mg | ORAL_TABLET | Freq: Every day | ORAL | 3 refills | Status: DC

## 2020-09-22 NOTE — Addendum Note (Signed)
Addended by: Martinique, Allyanna Appleman G on: 09/22/2020 10:28 PM   Modules accepted: Orders

## 2020-09-23 ENCOUNTER — Other Ambulatory Visit: Payer: Self-pay

## 2020-09-23 DIAGNOSIS — E876 Hypokalemia: Secondary | ICD-10-CM

## 2020-09-23 MED ORDER — POTASSIUM CHLORIDE CRYS ER 20 MEQ PO TBCR: 10.0000 meq | EXTENDED_RELEASE_TABLET | Freq: Every day | ORAL | 1 refills | Status: DC

## 2020-09-23 MED ORDER — LOSARTAN POTASSIUM 100 MG PO TABS: 100.0000 mg | ORAL_TABLET | Freq: Every day | ORAL | 3 refills | Status: DC

## 2020-09-23 MED ORDER — SPIRONOLACTONE 25 MG PO TABS: 25.0000 mg | ORAL_TABLET | Freq: Every day | ORAL | 1 refills | Status: DC

## 2020-10-07 ENCOUNTER — Other Ambulatory Visit: Payer: Self-pay | Admitting: Family Medicine

## 2020-10-07 ENCOUNTER — Other Ambulatory Visit: Payer: Self-pay

## 2020-10-07 ENCOUNTER — Other Ambulatory Visit (INDEPENDENT_AMBULATORY_CARE_PROVIDER_SITE_OTHER): Payer: Medicare HMO

## 2020-10-07 DIAGNOSIS — E876 Hypokalemia: Secondary | ICD-10-CM | POA: Diagnosis not present

## 2020-10-07 DIAGNOSIS — R252 Cramp and spasm: Secondary | ICD-10-CM

## 2020-10-07 LAB — BASIC METABOLIC PANEL
BUN: 11 mg/dL (ref 6–23)
CO2: 30 mEq/L (ref 19–32)
Calcium: 9.3 mg/dL (ref 8.4–10.5)
Chloride: 104 mEq/L (ref 96–112)
Creatinine, Ser: 0.99 mg/dL (ref 0.40–1.20)
GFR: 59.23 mL/min — ABNORMAL LOW (ref >60.00)
Glucose, Bld: 93 mg/dL (ref 70–99)
Potassium: 4 mEq/L (ref 3.5–5.1)
Sodium: 142 mEq/L (ref 135–145)

## 2020-10-21 ENCOUNTER — Telehealth: Payer: Self-pay | Admitting: Family Medicine

## 2020-10-21 NOTE — Telephone Encounter (Signed)
Informed patient of results and patient verbalized understanding.  

## 2020-10-21 NOTE — Telephone Encounter (Signed)
Pt is calling in to get her lab results from 10/07/2020 and would like to see if she can get a call back today.

## 2020-10-22 ENCOUNTER — Other Ambulatory Visit: Payer: Self-pay | Admitting: Family Medicine

## 2020-11-03 ENCOUNTER — Telehealth (INDEPENDENT_AMBULATORY_CARE_PROVIDER_SITE_OTHER): Payer: Medicare HMO | Admitting: Family Medicine

## 2020-11-03 DIAGNOSIS — J111 Influenza due to unidentified influenza virus with other respiratory manifestations: Secondary | ICD-10-CM | POA: Diagnosis not present

## 2020-11-03 MED ORDER — BENZONATATE 100 MG PO CAPS: 100.0000 mg | ORAL_CAPSULE | Freq: Three times a day (TID) | ORAL | 0 refills | Status: DC | PRN

## 2020-11-03 NOTE — Patient Instructions (Signed)
-  I sent the medication(s) we discussed to your pharmacy: Meds ordered this encounter  Medications  . benzonatate (TESSALON PERLES) 100 MG capsule    Sig: Take 1 capsule (100 mg total) by mouth 3 (three) times daily as needed.    Dispense:  20 capsule    Refill:  0   Nasal saline twice daily  Continue allergy medication  Stay away from dairy when sick  I hope you are feeling better soon!  Seek in person care promptly if your symptoms worsen, new concerns arise or you are not improving with treatment.  It was nice to meet you today. I help [facility] out with telemedicine visits on Tuesdays and Thursdays and am available for visits on those days. If you have any concerns or questions following this visit please schedule a follow up visit with your Primary Care doctor or seek care at a local urgent care clinic to avoid delays in care.

## 2020-11-03 NOTE — Progress Notes (Signed)
Virtual Visit via Telephone Note  I connected with Jennifer Davila on 11/03/20 at 11:20 AM EDT by telephone and verified that I am speaking with the correct person using two identifiers.   I discussed the limitations, risks, security and privacy concerns of performing an evaluation and management service by telephone and the availability of in person appointments. I also discussed with the patient that there may be a patient responsible charge related to this service. The patient expressed understanding and agreed to proceed.  Location patient: home, [place] Location provider: work or home office Participants present for the call: patient, provider Patient did not have a visit with me in the prior 7 days to address this/these issue(s).   History of Present Illness:  Acute telemedicine visit for cough and flu like symptoms: -Onset: 8 days ago -Symptoms include: nasal congestion, sore throat, cough body aches, hot and cold, diarrhea, laryngitis -starting to feel better today -Denies: CP, SOB, vomiting, inability to eat/drink/get out of bed -husband was sick with the same, they tested negative for covid -Has tried: coricidin, musinex,  -Pertinent past medical history: seasonal allergies, HTN, GERD, tea and water -Pertinent medication allergies: nkda -COVID-19 vaccine status: reports has had 2 covid doses; had flu shot   Observations/Objective: Patient sounds cheerful and well on the phone. I do not appreciate any SOB. Speech and thought processing are grossly intact. Patient reported vitals:  Assessment and Plan:  Influenza-like illness  -we discussed possible serious and likely etiologies, options for evaluation and workup, limitations of telemedicine visit vs in person visit, treatment, treatment risks and precautions. Pt prefers to treat via telemedicine empirically rather than in person at this moment. Query influenza, possible covid with false negative test vs other. She  reports she is really starting to improve today, so opted for Tessalon for cough and symptomatic care per patient instructions. Discussed potential complications and precautions. Scheduled follow up with PCP offered:  She agrees to schedule follow up if needed. Advised to seek prompt in person care if worsening, new symptoms arise, or if is not improving with treatment. Advised of options for inperson care in case PCP office not available. Did let the patient know that I only do telemedicine shifts for [place] on Tuesdays and Thursdays and advised a follow up visit with PCP or at an [facility] if has further questions or concerns.   Follow Up Instructions:  I did not refer this patient for an OV with me in the next 24 hours for this/these issue(s).  I discussed the assessment and treatment plan with the patient. The patient was provided an opportunity to ask questions and all were answered. The patient agreed with the plan and demonstrated an understanding of the instructions.   I spent 11 minutes on the date of this visit in the care of this patient. See summary of tasks completed to properly care for this patient in the detailed notes above which also included counseling of above, review of PMH, medications, allergies, evaluation of the patient and ordering and/or  instructing patient on testing and care options.     Lucretia Kern, DO

## 2020-11-23 ENCOUNTER — Other Ambulatory Visit: Payer: Self-pay | Admitting: Family Medicine

## 2020-11-28 ENCOUNTER — Ambulatory Visit
Admission: RE | Admit: 2020-11-28 | Discharge: 2020-11-28 | Disposition: A | Discharge status: Home / Self Care | Payer: Medicare HMO | Source: Ambulatory Visit | Attending: Family Medicine | Admitting: Family Medicine

## 2020-11-28 ENCOUNTER — Other Ambulatory Visit: Payer: Self-pay

## 2020-11-28 DIAGNOSIS — Z1231 Encounter for screening mammogram for malignant neoplasm of breast: Secondary | ICD-10-CM | POA: Diagnosis not present

## 2020-12-06 ENCOUNTER — Other Ambulatory Visit: Payer: Self-pay | Admitting: Family Medicine

## 2020-12-12 ENCOUNTER — Telehealth: Payer: Self-pay | Admitting: Family Medicine

## 2020-12-12 NOTE — Progress Notes (Signed)
  Chronic Care Management   Note  12/12/2020 Name: Jennifer Davila MRN: [id number] DOB: November 20, 1955  Jennifer Davila is a 65 y.o. year old female who is a primary care patient of Martinique, Malka So, MD. I reached out to [name] by phone today in response to a referral sent by [name] PCP, Martinique, [name], MD.   [name] was given information about Chronic Care Management services today including:  1. CCM service includes personalized support from designated clinical staff supervised by her physician, including individualized plan of care and coordination with other care providers 2. 24/7 contact phone numbers for assistance for urgent and routine care needs. 3. Service will only be billed when office clinical staff spend 20 minutes or more in a month to coordinate care. 4. Only one practitioner may furnish and bill the service in a calendar month. 5. The patient may stop CCM services at any time (effective at the end of the month) by phone call to the office staff.   Patient agreed to services and verbal consent obtained.   Follow up plan:  [place]

## 2020-12-16 ENCOUNTER — Ambulatory Visit: Payer: Medicare HMO

## 2020-12-16 ENCOUNTER — Other Ambulatory Visit: Payer: Self-pay | Admitting: Family Medicine

## 2020-12-16 DIAGNOSIS — I10 Essential (primary) hypertension: Secondary | ICD-10-CM

## 2020-12-19 ENCOUNTER — Ambulatory Visit (INDEPENDENT_AMBULATORY_CARE_PROVIDER_SITE_OTHER): Payer: Medicare HMO

## 2020-12-19 DIAGNOSIS — Z0000 Encounter for general adult medical examination without abnormal findings: Secondary | ICD-10-CM | POA: Diagnosis not present

## 2020-12-19 NOTE — Patient Instructions (Signed)
[name] , Thank you for taking time to come for your Medicare Wellness Visit. I appreciate your ongoing commitment to your health goals. Please review the following plan we discussed and let me know if I can assist you in the future.   Screening recommendations/referrals: Colonoscopy: current due 01/09/2023 Mammogram: current due 11/29/2022 Bone Density: current due 09/06/2022 Recommended yearly ophthalmology/optometry visit for glaucoma screening and checkup Recommended yearly dental visit for hygiene and checkup  Vaccinations: Influenza vaccine: current due fall 2022 Pneumococcal vaccine: completed series  Tdap vaccine: current due 2026 Shingles vaccine: will obtain local pharmacy   Advanced directives: none   Conditions/risks identified: bilateral  chronic leg pain made appointment for evaluation   Next appointment: June 1st 215 pm Dr. Martinique for evaluation of Bilateral chronic leg pain    Preventive Care 65 Years and Older, Female Preventive care refers to lifestyle choices and visits with your health care provider that can promote health and wellness. What does preventive care include?  A yearly physical exam. This is also called an annual well check.  Dental exams once or twice a year.  Routine eye exams. Ask your health care provider how often you should have your eyes checked.  Personal lifestyle choices, including:  Daily care of your teeth and gums.  Regular physical activity.  Eating a healthy diet.  Avoiding tobacco and drug use.  Limiting alcohol use.  Practicing safe sex.  Taking low-dose aspirin every day.  Taking vitamin and mineral supplements as recommended by your health care provider. What happens during an annual well check? The services and screenings done by your health care provider during your annual well check will depend on your age, overall health, lifestyle risk factors, and family history of disease. Counseling  Your health care  provider may ask you questions about your:  Alcohol use.  Tobacco use.  Drug use.  Emotional well-being.  Home and relationship well-being.  Sexual activity.  Eating habits.  History of falls.  Memory and ability to understand (cognition).  Work and work Statistician.  Reproductive health. Screening  You may have the following tests or measurements:  Height, weight, and BMI.  Blood pressure.  Lipid and cholesterol levels. These may be checked every 5 years, or more frequently if you are over 71 years old.  Skin check.  Lung cancer screening. You may have this screening every year starting at age 86 if you have a 30-pack-year history of smoking and currently smoke or have quit within the past 15 years.  Fecal occult blood test (FOBT) of the stool. You may have this test every year starting at age 84.  Flexible sigmoidoscopy or colonoscopy. You may have a sigmoidoscopy every 5 years or a colonoscopy every 10 years starting at age 49.  Hepatitis C blood test.  Hepatitis B blood test.  Sexually transmitted disease (STD) testing.  Diabetes screening. This is done by checking your blood sugar (glucose) after you have not eaten for a while (fasting). You may have this done every 1-3 years.  Bone density scan. This is done to screen for osteoporosis. You may have this done starting at age 93.  Mammogram. This may be done every 1-2 years. Talk to your health care provider about how often you should have regular mammograms. Talk with your health care provider about your test results, treatment options, and if necessary, the need for more tests. Vaccines  Your health care provider may recommend certain vaccines, such as:  Influenza vaccine. This is  recommended every year.  Tetanus, diphtheria, and acellular pertussis (Tdap, Td) vaccine. You may need a Td booster every 10 years.  Zoster vaccine. You may need this after age 54.  Pneumococcal 13-valent conjugate (PCV13)  vaccine. One dose is recommended after age 87.  Pneumococcal polysaccharide (PPSV23) vaccine. One dose is recommended after age 61. Talk to your health care provider about which screenings and vaccines you need and how often you need them. This information is not intended to replace advice given to you by your health care provider. Make sure you discuss any questions you have with your health care provider. Document Released: 08/12/2015 Document Revised: 04/04/2016 Document Reviewed: 05/17/2015 Elsevier Interactive Patient Education  2017 [place] Prevention in the Home Falls can cause injuries. They can happen to people of all ages. There are many things you can do to make your home safe and to help prevent falls. What can I do on the outside of my home?  Regularly fix the edges of walkways and driveways and fix any cracks.  Remove anything that might make you trip as you walk through a door, such as a raised step or threshold.  Trim any bushes or trees on the path to your home.  Use bright outdoor lighting.  Clear any walking paths of anything that might make someone trip, such as rocks or tools.  Regularly check to see if handrails are loose or broken. Make sure that both sides of any steps have handrails.  Any raised decks and porches should have guardrails on the edges.  Have any leaves, snow, or ice cleared regularly.  Use sand or salt on walking paths during winter.  Clean up any spills in your garage right away. This includes oil or grease spills. What can I do in the bathroom?  Use night lights.  Install grab bars by the toilet and in the tub and shower. Do not use towel bars as grab bars.  Use non-skid mats or decals in the tub or shower.  If you need to sit down in the shower, use a plastic, non-slip stool.  Keep the floor dry. Clean up any water that spills on the floor as soon as it happens.  Remove soap buildup in the tub or shower  regularly.  Attach bath mats securely with double-sided non-slip rug tape.  Do not have throw rugs and other things on the floor that can make you trip. What can I do in the bedroom?  Use night lights.  Make sure that you have a light by your bed that is easy to reach.  Do not use any sheets or blankets that are too big for your bed. They should not hang down onto the floor.  Have a firm chair that has side arms. You can use this for support while you get dressed.  Do not have throw rugs and other things on the floor that can make you trip. What can I do in the kitchen?  Clean up any spills right away.  Avoid walking on wet floors.  Keep items that you use a lot in easy-to-reach places.  If you need to reach something above you, use a strong step stool that has a grab bar.  Keep electrical cords out of the way.  Do not use floor polish or wax that makes floors slippery. If you must use wax, use non-skid floor wax.  Do not have throw rugs and other things on the floor that can make you trip.  What can I do with my stairs?  Do not leave any items on the stairs.  Make sure that there are handrails on both sides of the stairs and use them. Fix handrails that are broken or loose. Make sure that handrails are as long as the stairways.  Check any carpeting to make sure that it is firmly attached to the stairs. Fix any carpet that is loose or worn.  Avoid having throw rugs at the top or bottom of the stairs. If you do have throw rugs, attach them to the floor with carpet tape.  Make sure that you have a light switch at the top of the stairs and the bottom of the stairs. If you do not have them, ask someone to add them for you. What else can I do to help prevent falls?  Wear shoes that:  Do not have high heels.  Have rubber bottoms.  Are comfortable and fit you well.  Are closed at the toe. Do not wear sandals.  If you use a stepladder:  Make sure that it is fully  opened. Do not climb a closed stepladder.  Make sure that both sides of the stepladder are locked into place.  Ask someone to hold it for you, if possible.  Clearly mark and make sure that you can see:  Any grab bars or handrails.  First and last steps.  Where the edge of each step is.  Use tools that help you move around (mobility aids) if they are needed. These include:  Canes.  Walkers.  Scooters.  Crutches.  Turn on the lights when you go into a dark area. Replace any light bulbs as soon as they burn out.  Set up your furniture so you have a clear path. Avoid moving your furniture around.  If any of your floors are uneven, fix them.  If there are any pets around you, be aware of where they are.  Review your medicines with your doctor. Some medicines can make you feel dizzy. This can increase your chance of falling. Ask your doctor what other things that you can do to help prevent falls. This information is not intended to replace advice given to you by your health care provider. Make sure you discuss any questions you have with your health care provider. Document Released: 05/12/2009 Document Revised: 12/22/2015 Document Reviewed: 08/20/2014 Elsevier Interactive Patient Education  2017 [employer].

## 2020-12-19 NOTE — Progress Notes (Signed)
Subjective:   Jennifer Davila is a 65 y.o. female who presents for an Initial Medicare Annual Wellness Visit.  I connected with Maude Leriche  today by telephone and verified that I am speaking with the correct person using two identifiers. Location patient: home Location provider: work Persons participating in the virtual visit: patient, provider.   I discussed the limitations, risks, security and privacy concerns of performing an evaluation and management service by telephone and the availability of in person appointments. I also discussed with the patient that there may be a patient responsible charge related to this service. The patient expressed understanding and verbally consented to this telephonic visit.    Interactive audio and video telecommunications were attempted between this provider and patient, however failed, due to patient having technical difficulties OR patient did not have access to video capability.  We continued and completed visit with audio only.    Review of Systems    n/a       Objective:    There were no vitals filed for this visit. There is no height or weight on file to calculate BMI.  Advanced Directives 06/26/2018 05/14/2018 01/06/2018 10/02/2017 10/10/2016 08/25/2016 05/29/2016  Does Patient Have a Medical Advance Directive? No Yes No No No No No  Type of Advance Directive - Living will - - - - -  Would patient like information on creating a medical advance directive? No - Patient declined - No - Patient declined - - - No - patient declined information    Current Medications (verified) Outpatient Encounter Medications as of 12/19/2020  Medication Sig  . acetaminophen (TYLENOL) 500 MG tablet Take 1-2 tablets (500-1,000 mg total) by mouth daily as needed for moderate pain (pain).  Marland Kitchen amLODipine (NORVASC) 5 MG tablet TAKE 1 TABLET BY MOUTH EVERYDAY AT BEDTIME  . benzonatate (TESSALON PERLES) 100 MG capsule Take 1 capsule (100 mg total) by mouth 3  (three) times daily as needed.  . cetirizine (ZYRTEC) 10 MG tablet Take 1 tablet (10 mg total) by mouth 2 (two) times daily.  Marland Kitchen esomeprazole (NEXIUM) 40 MG capsule TAKE 1 CAPSULE BY MOUTH EVERY DAY  . ibuprofen (ADVIL) 400 MG tablet Take 1 tablet (400 mg total) by mouth daily as needed.  Marland Kitchen losartan (COZAAR) 100 MG tablet Take 1 tablet (100 mg total) by mouth daily.  . montelukast (SINGULAIR) 10 MG tablet Take 1 tablet (10 mg total) by mouth at bedtime.  . potassium chloride (KLOR-CON) 10 MEQ tablet TAKE 1 TABLET BY MOUTH EVERY DAY  . pravastatin (PRAVACHOL) 20 MG tablet Take 1 tablet (20 mg total) by mouth daily.  Marland Kitchen spironolactone (ALDACTONE) 25 MG tablet TAKE 1 TABLET (25 MG TOTAL) BY MOUTH DAILY.  Marland Kitchen tiZANidine (ZANAFLEX) 4 MG tablet TAKE 1 TABLET BY MOUTH 2 TIMES DAILY AS NEEDED.   No facility-administered encounter medications on file as of 12/19/2020.    Allergies (verified) Patient has no known allergies.   History: Past Medical History:  Diagnosis Date  . Arthritis   . Constipation    on stool softener  . GERD (gastroesophageal reflux disease)   . Hyperlipidemia   . Hypertension   . Trouble swallowing 2013   following lap band  . Urticaria    Past Surgical History:  Procedure Laterality Date  . ABDOMINAL HYSTERECTOMY    . BREAST BIOPSY Left 11/04/2019    BENIGN LYMPH NODE WITH PARACORTICAL HYPERPLASIA  . CHOLECYSTECTOMY    . COLONOSCOPY N/A 01/08/2018   Procedure: COLONOSCOPY;  Surgeon: Yetta Flock, MD;  Location: [facility];  Service: Gastroenterology;  Laterality: N/A;  . LAPAROSCOPIC GASTRIC BANDING    . LAPAROSCOPIC REPAIR AND REMOVAL OF GASTRIC BAND  2013-2014   Family History  Problem Relation Age of Onset  . Hypertension Mother   . Stroke Mother   . Diabetes Father   . Alcohol abuse Father   . Kidney disease Father   . Colon polyps Brother   . Cancer Brother   . Alcohol abuse Brother    Social History   Socioeconomic History  . Marital  status: Married    Spouse name: Not on file  . Number of children: 1  . Years of education: Not on file  . Highest education level: Not on file  Occupational History  . Not on file  Tobacco Use  . Smoking status: Former Smoker    Types: Cigarettes    Quit date: 10/02/1972    Years since quitting: 48.2  . Smokeless tobacco: Never Used  Vaping Use  . Vaping Use: Never used  Substance and Sexual Activity  . Alcohol use: No  . Drug use: No  . Sexual activity: Never  Other Topics Concern  . Not on file  Social History Narrative  . Not on file   Social Determinants of Health   Financial Resource Strain: Not on file  Food Insecurity: Not on file  Transportation Needs: Not on file  Physical Activity: Not on file  Stress: Not on file  Social Connections: Not on file    Tobacco Counseling Counseling given: Not Answered   Clinical Intake:                 Diabetic?no         Activities of Daily Living No flowsheet data found.  Patient Care Team: Martinique, [name], MD as PCP - General (Family Medicine) Viona Gilmore, [facility] as Pharmacist (Pharmacist)  Indicate any recent Medical Services you may have received from other than [facility] providers in the past year (date may be approximate).     Assessment:   This is a routine wellness examination for [place].  Hearing/Vision screen No exam data present  Dietary issues and exercise activities discussed:    Goals Addressed   None    Depression Screen PHQ 2/9 Scores 08/27/2019 10/18/2017 05/29/2016 05/03/2016 02/10/2016 10/31/2015 08/29/2015  PHQ - 2 Score 0 0 0 0 0 0 0    Fall Risk Fall Risk  05/29/2016 05/03/2016 02/10/2016 10/31/2015 08/29/2015  Falls in the past year? No No No No No    FALL RISK PREVENTION PERTAINING TO THE HOME:  Any stairs in or around the home? No  If so, are there any without handrails? Yes  Home free of loose throw rugs in walkways, pet beds, electrical cords, etc? Yes  Adequate  lighting in your home to reduce risk of falls? Yes   ASSISTIVE DEVICES UTILIZED TO PREVENT FALLS:  Life alert? No  Use of a cane, walker or w/c? No  Grab bars in the bathroom? No  Shower chair or bench in shower? No  Elevated toilet seat or a handicapped toilet? No   TIMED UP AND GO:    Cognitive Function:       Normal cognitive status assessed by direct observation by this Nurse Health Advisor. No abnormalities found.    Immunizations Immunization History  Administered Date(s) Administered  . Fluad Quad(high Dose 65+) 06/15/2020  . Hepatitis B, adult 10/13/2019, 11/17/2019, 05/18/2020  .  Influenza, High Dose Seasonal PF 04/29/2019  . Influenza,inj,Quad PF,6+ Mos 05/23/2015, 05/03/2016, 04/23/2018  . Moderna Sars-Covid-2 Vaccination 02/05/2020  . PPD Test 05/14/2018  . Pneumococcal Conjugate-13 08/04/2015  . Pneumococcal Polysaccharide-23 08/26/2019  . Tdap 05/23/2015    TDAP status: Up to date  Flu Vaccine status: Up to date  Pneumococcal vaccine status: Up to date  Covid-19 vaccine status: Completed vaccines  Qualifies for Shingles Vaccine? Yes   Zostavax completed No   Shingrix Completed?: No.    Education has been provided regarding the importance of this vaccine. Patient has been advised to call insurance company to determine out of pocket expense if they have not yet received this vaccine. Advised may also receive vaccine at local pharmacy or Health Dept. Verbalized acceptance and understanding.  Screening Tests Health Maintenance  Topic Date Due  . COVID-19 Vaccine (2 - Moderna 3-dose series) 03/04/2020  . INFLUENZA VACCINE  02/27/2021  . MAMMOGRAM  11/28/2021  . COLONOSCOPY (Pts 45-3yrs Insurance coverage will need to be confirmed)  01/09/2023  . TETANUS/TDAP  05/22/2025  . DEXA SCAN  Completed  . Hepatitis C Screening  Completed  . PNA vac Low Risk Adult  Completed  . HPV VACCINES  Aged Out    Health Maintenance  Health Maintenance Due  Topic  Date Due  . COVID-19 Vaccine (2 - Moderna 3-dose series) 03/04/2020    Colorectal cancer screening: Type of screening: Colonoscopy. Completed 01/08/2018. Repeat every 5 years  Mammogram status: Completed 11/28/2021. Repeat every year  Bone Density status: Completed 09/07/2019. Results reflect: Bone density results: NORMAL. Repeat every 5 years.  Lung Cancer Screening: (Low Dose CT Chest recommended if Age 56-80 years, 30 pack-year currently smoking OR have quit w/in 15years.) does not qualify.   Lung Cancer Screening Referral: n/a  Additional Screening:  Hepatitis C Screening: does not qualify; Completed  Vision Screening: Recommended annual ophthalmology exams for early detection of glaucoma and other disorders of the eye. Is the patient up to date with their annual eye exam?  Yes  Who is the provider or what is the name of the office in which the patient attends annual eye exams? [facility] If pt is not established with a provider, would they like to be referred to a provider to establish care? No .   Dental Screening: Recommended annual dental exams for proper oral hygiene  Community Resource Referral / Chronic Care Management: CRR required this visit?  No   CCM required this visit?  No      Plan:     I have personally reviewed and noted the following in the patient's chart:   . Medical and social history . Use of alcohol, tobacco or illicit drugs  . Current medications and supplements including opioid prescriptions. Patient is not currently taking opioid prescriptions. . Functional ability and status . Nutritional status . Physical activity . Advanced directives . List of other physicians . Hospitalizations, surgeries, and ER visits in previous 12 months . Vitals . Screenings to include cognitive, depression, and falls . Referrals and appointments  In addition, I have reviewed and discussed with patient certain preventive protocols, quality metrics, and best  practice recommendations. A written personalized care plan for preventive services as well as general preventive health recommendations were provided to patient.     Randel Pigg, LPN   [id number]   Nurse Notes: none

## 2020-12-28 ENCOUNTER — Encounter: Payer: Self-pay | Admitting: Family Medicine

## 2020-12-28 ENCOUNTER — Ambulatory Visit (INDEPENDENT_AMBULATORY_CARE_PROVIDER_SITE_OTHER): Payer: Medicare HMO | Admitting: Family Medicine

## 2020-12-28 ENCOUNTER — Other Ambulatory Visit: Payer: Self-pay

## 2020-12-28 VITALS — BP 136/80 | HR 84 | Resp 16 | Ht 61.0 in | Wt 251.2 lb

## 2020-12-28 DIAGNOSIS — I10 Essential (primary) hypertension: Secondary | ICD-10-CM

## 2020-12-28 DIAGNOSIS — G8929 Other chronic pain: Secondary | ICD-10-CM | POA: Diagnosis not present

## 2020-12-28 DIAGNOSIS — M79605 Pain in left leg: Secondary | ICD-10-CM | POA: Diagnosis not present

## 2020-12-28 DIAGNOSIS — M545 Low back pain, unspecified: Secondary | ICD-10-CM | POA: Diagnosis not present

## 2020-12-28 DIAGNOSIS — M79604 Pain in right leg: Secondary | ICD-10-CM | POA: Diagnosis not present

## 2020-12-28 DIAGNOSIS — E876 Hypokalemia: Secondary | ICD-10-CM | POA: Diagnosis not present

## 2020-12-28 DIAGNOSIS — R7303 Prediabetes: Secondary | ICD-10-CM

## 2020-12-28 MED ORDER — HYDROCODONE-ACETAMINOPHEN 5-325 MG PO TABS: 1.0000 | ORAL_TABLET | Freq: Every day | ORAL | 0 refills | Status: DC | PRN

## 2020-12-28 MED ORDER — LOSARTAN POTASSIUM 100 MG PO TABS: 100.0000 mg | ORAL_TABLET | Freq: Every day | ORAL | 2 refills | Status: DC

## 2020-12-28 NOTE — Patient Instructions (Addendum)
A few things to remember from today's visit:   Bilateral leg pain - Plan: Ambulatory referral to Orthopedic Surgery, HYDROcodone-acetaminophen (NORCO/VICODIN) 5-325 MG tablet  Chronic bilateral low back pain, unspecified whether sciatica present - Plan: Ambulatory referral to Orthopedic Surgery  Hypertension, essential, benign  Hypokalemia - Plan: Potassium  Prediabetes - Plan: Hemoglobin A1c  If you need refills please call your pharmacy. Do not use My Chart to request refills or for acute issues that need immediate attention.   Hydrocodone to take at bedtime. Ortho referral placed. Wt loss will help, so continue working on it.  Please be sure medication list is accurate. If a new problem present, please set up appointment sooner than planned today.

## 2020-12-28 NOTE — Progress Notes (Signed)
HPI:  Jennifer Davila is a pleasant 65 y.o. female, who is here today for follow up.   She was last seen on 09/21/20. Today she is c/o worsening LE pain. Problem is chronic, she has had it since 2016.  She cannot describe pain, 7-8/10, it can be up to 10/10. Pain is worse at night, interferes with sleep.  Ibuprofen helps, she takes medication Sunday because she has to get up and stand longer, which aggravate pain.  "Crowling" like sensation on back of thigh down to ankle,back and fourth.  Exacerbated by prolonged sitting. She has not identified alleviating factors. Gabapentin 600 mg tid did not help. She did not tolerate Cymbalta.  Occasional back pain, not radiated. Intermittent, exacerbated by activities that involve bending. Negative for saddle anesthesia or bladder/bowel dysfunction.  LE/feet cramps at night, Zanaflex is not helping.  Lumbar MRI in 12/2017: L4-5: Bilateral facet arthropathy with edema. Anterolisthesis of 3 mm that could worsen with standing or flexion. Bulging of the disc. Canal and foraminal stenosis that could be symptomatic. The facet arthropathy could also be a cause of back pain or referred facet syndrome pain. This appearance could worsen with standing or flexion.  L5-S1: Facet osteoarthritis right more than left, which similarly could be symptomatic. Mild right foraminal narrowing without definite neural compression. 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  Evaluated by neurosurgeon in 01/2018. I was not thought that leg pain was caused by spinal stenosis. Because at that time she was having GI bleed, epidural injection was not recommended.  Saw vascular, Dr Kasandra Knudsen on 02/12/2020. Recommended compression stocking and elevation.  HypoK+: She is on KLOR 10 meq daily.  HTN: She is on Spironolactone 25 mg daily,Losartan 100 mg daily,and Amlodipine 5 mg daily. Negative for  severe/frequent headache, visual changes, chest pain, dyspnea, palpitation, focal weakness, or worsening edema.  Lab Results  Component Value Date   CREATININE 0.99 10/07/2020   BUN 11 10/07/2020   NA 142 10/07/2020   K 4.0 10/07/2020   CL 104 10/07/2020   CO2 30 10/07/2020   Obesity: She is not exercising regularly due to pain. Started eating again after 6 pm.   Prediabetes, last HgA1C was 6.4 in 08/2020. Negative for polydipsia,polyuria, or polyphagia.  Review of Systems  Constitutional: Positive for fatigue. Negative for activity change, appetite change and fever.  HENT: Negative for mouth sores, nosebleeds and sore throat.   Respiratory: Negative for cough and wheezing.   Gastrointestinal: Negative for abdominal pain, nausea and vomiting.       Negative for changes in bowel habits.  Genitourinary: Negative for decreased urine volume and hematuria.  Musculoskeletal: Positive for back pain. Negative for gait problem.  Skin: Negative for pallor and rash.  Neurological: Negative for syncope, facial asymmetry and weakness.  Rest of ROS, see pertinent positives sand negatives in HPI  Current Outpatient Medications on File Prior to Visit  Medication Sig Dispense Refill  . acetaminophen (TYLENOL) 500 MG tablet Take 1-2 tablets (500-1,000 mg total) by mouth daily as needed for moderate pain (pain). 60 tablet 1  . amLODipine (NORVASC) 5 MG tablet TAKE 1 TABLET BY MOUTH EVERYDAY AT BEDTIME 90 tablet 1  . cetirizine (ZYRTEC) 10 MG tablet Take 1 tablet (10 mg total) by mouth 2 (two) times daily. 60 tablet 5  . esomeprazole (NEXIUM) 40 MG capsule TAKE 1 CAPSULE BY MOUTH EVERY DAY 90 capsule 3  . ibuprofen (ADVIL) 400  MG tablet Take 1 tablet (400 mg total) by mouth daily as needed. 30 tablet 1  . potassium chloride (KLOR-CON) 10 MEQ tablet TAKE 1 TABLET BY MOUTH EVERY DAY 90 tablet 2  . pravastatin (PRAVACHOL) 20 MG tablet Take 1 tablet (20 mg total) by mouth daily. 90 tablet 3  .  spironolactone (ALDACTONE) 25 MG tablet TAKE 1 TABLET (25 MG TOTAL) BY MOUTH DAILY. 90 tablet 1  . tiZANidine (ZANAFLEX) 4 MG tablet TAKE 1 TABLET BY MOUTH 2 TIMES DAILY AS NEEDED. 30 tablet 3  . montelukast (SINGULAIR) 10 MG tablet Take 1 tablet (10 mg total) by mouth at bedtime. (Patient not taking: Reported on 12/28/2020) 30 tablet 5   No current facility-administered medications on file prior to visit.   Past Medical History:  Diagnosis Date  . Arthritis   . Constipation    on stool softener  . GERD (gastroesophageal reflux disease)   . Hyperlipidemia   . Hypertension   . Trouble swallowing 2013   following lap band  . Urticaria    No Known Allergies  Social History   Socioeconomic History  . Marital status: Married    Spouse name: Not on file  . Number of children: 1  . Years of education: Not on file  . Highest education level: Not on file  Occupational History  . Not on file  Tobacco Use  . Smoking status: Former Smoker    Types: Cigarettes    Quit date: 10/02/1972    Years since quitting: 48.2  . Smokeless tobacco: Never Used  Vaping Use  . Vaping Use: Never used  Substance and Sexual Activity  . Alcohol use: No  . Drug use: No  . Sexual activity: Never  Other Topics Concern  . Not on file  Social History Narrative  . Not on file   Social Determinants of Health   Financial Resource Strain: Not on file  Food Insecurity: Not on file  Transportation Needs: No Transportation Needs  . Lack of Transportation (Medical): No  . Lack of Transportation (Non-Medical): No  Physical Activity: Insufficiently Active  . Days of Exercise per Week: 2 days  . Minutes of Exercise per Session: 30 min  Stress: Not on file  Social Connections: Socially Integrated  . Frequency of Communication with Friends and Family: Twice a week  . Frequency of Social Gatherings with Friends and Family: Twice a week  . Attends Religious Services: More than 4 times per year  . Active Member  of Clubs or Organizations: Yes  . Attends Archivist Meetings: 1 to 4 times per year  . Marital Status: Married    Vitals:   12/28/20 1401  BP: 136/80  Pulse: 84  Resp: 16  SpO2: 98%   Wt Readings from Last 3 Encounters:  12/28/20 251 lb 4 oz (114 kg)  09/21/20 251 lb (113.9 kg)  06/15/20 246 lb 12.8 oz (111.9 kg)   Body mass index is 47.47 kg/m.  Physical Exam Vitals and nursing note reviewed.  Constitutional:      General: She is not in acute distress.    Appearance: She is well-developed.  HENT:     Head: Normocephalic and atraumatic.     Mouth/Throat:     Mouth: Mucous membranes are moist.     Pharynx: Oropharynx is clear.  Eyes:     Conjunctiva/sclera: Conjunctivae normal.  Cardiovascular:     Rate and Rhythm: Normal rate and regular rhythm.     Pulses:  Dorsalis pedis pulses are 2+ on the right side and 2+ on the left side.     Heart sounds: No murmur heard.   Pulmonary:     Effort: Pulmonary effort is normal. No respiratory distress.     Breath sounds: Normal breath sounds.  Abdominal:     Palpations: Abdomen is soft. There is no hepatomegaly or mass.     Tenderness: There is no abdominal tenderness.  Musculoskeletal:     Right lower leg: 1+ Pitting Edema present.     Left lower leg: 1+ Pitting Edema present.  Lymphadenopathy:     Cervical: No cervical adenopathy.  Skin:    General: Skin is warm.     Findings: No erythema or rash.  Neurological:     Mental Status: She is alert and oriented to person, place, and time.     Cranial Nerves: No cranial nerve deficit.     Gait: Gait normal.  Psychiatric:     Comments: Well groomed, good eye contact.   ASSESSMENT AND PLAN:  Jennifer Davila was seen today for follow-up.  Orders Placed This Encounter  Procedures  . Potassium  . Hemoglobin A1c  . Ambulatory referral to Orthopedic Surgery    Lab Results  Component Value Date   HGBA1C 6.4 12/28/2020   Bilateral leg  pain Chronic. We discussed possible etiologies, reviewed prior work up and visits with vascular and neurosurgeon. ?Radicular pain. Ortho referral placed. Because pain is interfering with sleep, she would like to try Hydrocodone-Acetaminophen at bedtime. Side effects discussed.  -     HYDROcodone-acetaminophen (NORCO/VICODIN) 5-325 MG tablet; Take 1 tablet by mouth daily as needed for moderate pain.  Chronic bilateral low back pain, unspecified whether sciatica present Mild. Ortho referral placed.  Hypertension, essential, benign BP adequately controlled. Continue same dose of Losartan,Spironolactone,and Amlodipine. Monitor BP at home. Low salt diet.  -     losartan (COZAAR) 100 MG tablet; Take 1 tablet (100 mg total) by mouth daily.  Hypokalemia Continue KLOR 10 meq. Further recommendations according to K+ result.  Prediabetes Stressed the importance of maintaining healthy life style habits for diabetes prevention.  Morbid obesity ([place]) Wt stable sine 08/2020. We discussed benefits of wt loss as well as adverse effects of obesity. Encouraged consistency with healthful diet and low impact physical activity.  Spent 40 minutes.  During this time history was obtained and documented, examination was performed, prior labs/imaging reviewed, and assessment/plan discussed.  Return in about 5 months (around 05/30/2021) for HTN.   Deontez Klinke G. Martinique, MD  [facility]. [place] office.   A few things to remember from today's visit:   Bilateral leg pain - Plan: Ambulatory referral to Orthopedic Surgery, HYDROcodone-acetaminophen (NORCO/VICODIN) 5-325 MG tablet  Chronic bilateral low back pain, unspecified whether sciatica present - Plan: Ambulatory referral to Orthopedic Surgery  Hypertension, essential, benign  Hypokalemia - Plan: Potassium  Prediabetes - Plan: Hemoglobin A1c  If you need refills please call your pharmacy. Do not use My Chart to request refills or  for acute issues that need immediate attention.   Hydrocodone to take at bedtime. Ortho referral placed. Wt loss will help, so continue working on it.  Please be sure medication list is accurate. If a new problem present, please set up appointment sooner than planned today.

## 2020-12-29 LAB — HEMOGLOBIN A1C: Hgb A1c MFr Bld: 6.4 % (ref 4.6–6.5)

## 2020-12-30 LAB — POTASSIUM: Potassium: 4.3 mEq/L (ref 3.5–5.1)

## 2021-01-12 ENCOUNTER — Encounter: Payer: Self-pay | Admitting: Orthopedic Surgery

## 2021-01-12 ENCOUNTER — Ambulatory Visit: Payer: Self-pay

## 2021-01-12 ENCOUNTER — Other Ambulatory Visit: Payer: Self-pay

## 2021-01-12 ENCOUNTER — Ambulatory Visit (INDEPENDENT_AMBULATORY_CARE_PROVIDER_SITE_OTHER): Payer: Medicare HMO | Admitting: Orthopedic Surgery

## 2021-01-12 DIAGNOSIS — M79604 Pain in right leg: Secondary | ICD-10-CM | POA: Diagnosis not present

## 2021-01-12 DIAGNOSIS — M79605 Pain in left leg: Secondary | ICD-10-CM | POA: Diagnosis not present

## 2021-01-12 DIAGNOSIS — M5410 Radiculopathy, site unspecified: Secondary | ICD-10-CM

## 2021-01-12 MED ORDER — PREDNISONE 10 MG PO TABS: 10.0000 mg | ORAL_TABLET | Freq: Every day | ORAL | 0 refills | Status: DC

## 2021-01-12 NOTE — Progress Notes (Signed)
Office Visit Note   Patient: Jennifer Davila           Date of Birth: [birth year]           MRN: [id number] Visit Date: 01/12/2021              Requested by: Martinique, [name], MD [address] [place],  [place] [address] PCP: Martinique, [name], MD  Chief Complaint  Patient presents with   Left Leg - Pain   Right Leg - Pain      HPI: Patient is a 65 year old woman who presents with a 1 year history of radicular pain from the buttocks bilaterally down the lateral aspect of her thigh and calfs.  She states the pain is getting worse she denies any specific injury.  Patient states that she has start up pain in the morning.  Assessment & Plan: Visit Diagnoses:  1. Bilateral leg pain   2. Radicular pain of both lower extremities     Plan: We will start with low-dose prednisone 10 mg with breakfast follow-up in 3 weeks.  Discussed that we may need to obtain an MRI scan for evaluation of an epidural steroid injection.  Follow-Up Instructions: Return in about 3 weeks (around 02/02/2021).   Ortho Exam  Patient is alert, oriented, no adenopathy, well-dressed, normal affect, normal respiratory effort. Examination patient has an antalgic gait she has a negative straight leg raise bilaterally with no focal motor weakness in either lower extremity.  Her radiograph shows a grade 1 spondylolisthesis at L4-5.  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  No images are attached to the encounter.  Labs: Lab Results  Component Value Date   HGBA1C 6.4 12/28/2020   HGBA1C 6.4 09/21/2020   HGBA1C 6.0 (H) 04/06/2020   ESRSEDRATE 33 (H) 06/07/2020   ESRSEDRATE 72 (H) 04/19/2020   CRP 31 (H) 04/19/2020     Lab Results  Component Value Date   ALBUMIN 4.6 04/19/2020   ALBUMIN 4.4 08/26/2019   ALBUMIN 4.1  06/26/2018    No results found for: MG Lab Results  Component Value Date   VD25OH 38 02/23/2020   VD25OH 26.17 (L) 12/22/2019   VD25OH <7.00 (L) 08/26/2019    No results found for: PREALBUMIN CBC EXTENDED Latest Ref Rng & Units 09/21/2020 04/19/2020 04/06/2020  WBC 4.0 - 10.5 K/uL 6.9 5.1 5.5  RBC 3.87 - 5.11 Mil/uL 5.30(H) 5.39(H) 5.42(H)  HGB 12.0 - 15.0 g/dL 12.2 12.6 12.9  HCT 36.0 - 46.0 % 39.0 40.5 41.0  PLT 150.0 - 400.0 K/uL 311.0 - 296  NEUTROABS 1.4 - 7.0 x10E3/uL - 3.3 -  LYMPHSABS 0.7 - 3.1 x10E3/uL - 1.2 -     There is no height or weight on file to calculate BMI.  Orders:  Orders Placed This Encounter  Procedures   XR Lumbar Spine 2-3 Views   No orders of the defined types were placed in this encounter.    Procedures: No procedures performed  Clinical Data: No additional findings.  ROS:  All other systems negative, except as noted in the HPI. Review of Systems  Objective: Vital Signs: There were no vitals taken for this visit.  Specialty Comments:  No specialty comments available.  PMFS History: Patient Active Problem List   Diagnosis Date Noted  Bilateral leg pain 06/03/2020   Chronic urticaria 05/26/2020   Seasonal and perennial allergic rhinitis 05/26/2020   Positive ANA (antinuclear antibody) 05/26/2020   Iron deficiency anemia 07/07/2018   Hematochezia 06/26/2018   Diverticulosis of colon with hemorrhage    Leg pain 01/06/2018   Bilateral lower extremity edema 01/06/2018   Lower GI bleed 01/06/2018   Hypokalemia 01/06/2018   Constipation 01/06/2018   Leg cramping 10/18/2017   Vitamin D deficiency, unspecified 10/18/2017   GERD (gastroesophageal reflux disease) 10/18/2017   Hypertension, essential, benign 09/04/2017   Hyperlipidemia 09/04/2017   Morbid obesity ([place]) 09/04/2017   Vertigo 09/04/2017   Hip pain 05/25/2015   Past Medical History:  Diagnosis Date   Arthritis    Constipation    on stool softener   GERD  (gastroesophageal reflux disease)    Hyperlipidemia    Hypertension    Trouble swallowing 2013   following lap band   Urticaria     Family History  Problem Relation Age of Onset   Hypertension Mother    Stroke Mother    Diabetes Father    Alcohol abuse Father    Kidney disease Father    Colon polyps Brother    Cancer Brother    Alcohol abuse Brother     Past Surgical History:  Procedure Laterality Date   ABDOMINAL HYSTERECTOMY     BREAST BIOPSY Left 11/04/2019    BENIGN LYMPH NODE WITH PARACORTICAL HYPERPLASIA   CHOLECYSTECTOMY     COLONOSCOPY N/A 01/08/2018   Procedure: COLONOSCOPY;  Surgeon: Yetta Flock, MD;  Location: [facility];  Service: Gastroenterology;  Laterality: N/A;   LAPAROSCOPIC GASTRIC BANDING     LAPAROSCOPIC REPAIR AND REMOVAL OF GASTRIC BAND  2013-2014   Social History   Occupational History   Not on file  Tobacco Use   Smoking status: Former    Pack years: 0.00    Types: Cigarettes    Quit date: 10/02/1972    Years since quitting: 48.3   Smokeless tobacco: Never  Vaping Use   Vaping Use: Never used  Substance and Sexual Activity   Alcohol use: No   Drug use: No   Sexual activity: Never

## 2021-01-26 ENCOUNTER — Ambulatory Visit (INDEPENDENT_AMBULATORY_CARE_PROVIDER_SITE_OTHER): Payer: Medicare HMO | Admitting: Orthopedic Surgery

## 2021-01-26 ENCOUNTER — Encounter: Payer: Self-pay | Admitting: Orthopedic Surgery

## 2021-01-26 ENCOUNTER — Other Ambulatory Visit: Payer: Self-pay

## 2021-01-26 DIAGNOSIS — M5410 Radiculopathy, site unspecified: Secondary | ICD-10-CM

## 2021-01-26 NOTE — Progress Notes (Signed)
Office Visit Note   Patient: Jennifer Davila           Date of Birth: 1955-08-06           MRN: [id number] Visit Date: 01/26/2021              Requested by: Martinique, [name], [place],  [place] [id number] PCP: Martinique, [name], MD  Chief Complaint  Patient presents with   Right Leg - Follow-up   Left Leg - Follow-up      HPI: Patient is a 65 year old woman who presents with persistent bilateral radicular pain she states the pain starts in the posterior aspect of both thighs and radiates down to her calf and foot.  Patient states the pain is worse with prolonged sitting and with laying down.  Patient states that she tried the prednisone but this caused some headaches and exacerbated her heartburn.  Assessment & Plan: Visit Diagnoses:  1. Radicular pain of both lower extremities     Plan: We will set patient up with physical therapy and will reevaluate in 4 weeks.  If she is not showing any improvement with therapy will request an MRI scan.  Follow-Up Instructions: Return in about 4 weeks (around 02/23/2021).   Ortho Exam  Patient is alert, oriented, no adenopathy, well-dressed, normal affect, normal respiratory effort. Examination patient has a normal gait no abductor lurch no clinical signs of hip pathology.  She has a negative straight leg raise bilaterally she does have venous swelling in both legs and does wear compression stockings.  She can stand on her toes she has poor balance motor strength is 5/5 no focal motor weakness.  Imaging: No results found. No images are attached to the encounter.  Labs: Lab Results  Component Value Date   HGBA1C 6.4 12/28/2020   HGBA1C 6.4 09/21/2020   HGBA1C 6.0 (H) 04/06/2020   ESRSEDRATE 33 (H) 06/07/2020   ESRSEDRATE 72 (H) 04/19/2020   CRP 31 (H) 04/19/2020     Lab Results  Component Value Date   ALBUMIN 4.6 04/19/2020   ALBUMIN 4.4 08/26/2019   ALBUMIN 4.1 06/26/2018    No results found for:  MG Lab Results  Component Value Date   VD25OH 38 02/23/2020   VD25OH 26.17 (L) 12/22/2019   VD25OH <7.00 (L) 08/26/2019    No results found for: PREALBUMIN CBC EXTENDED Latest Ref Rng & Units 09/21/2020 04/19/2020 04/06/2020  WBC 4.0 - 10.5 K/uL 6.9 5.1 5.5  RBC 3.87 - 5.11 Mil/uL 5.30(H) 5.39(H) 5.42(H)  HGB 12.0 - 15.0 g/dL 12.2 12.6 12.9  HCT 36.0 - 46.0 % 39.0 40.5 41.0  PLT 150.0 - 400.0 K/uL 311.0 - 296  NEUTROABS 1.4 - 7.0 x10E3/uL - 3.3 -  LYMPHSABS 0.7 - 3.1 x10E3/uL - 1.2 -     There is no height or weight on file to calculate BMI.  Orders:  No orders of the defined types were placed in this encounter.  No orders of the defined types were placed in this encounter.    Procedures: No procedures performed  Clinical Data: No additional findings.  ROS:  All other systems negative, except as noted in the HPI. Review of Systems  Objective: Vital Signs: There were no vitals taken for this visit.  Specialty Comments:  No specialty comments available.  PMFS History: Patient Active Problem List   Diagnosis Date Noted   Bilateral leg pain 06/03/2020   Chronic urticaria 05/26/2020   Seasonal and perennial  allergic rhinitis 05/26/2020   Positive ANA (antinuclear antibody) 05/26/2020   Iron deficiency anemia 07/07/2018   Hematochezia 06/26/2018   Diverticulosis of colon with hemorrhage    Leg pain 01/06/2018   Bilateral lower extremity edema 01/06/2018   Lower GI bleed 01/06/2018   Hypokalemia 01/06/2018   Constipation 01/06/2018   Leg cramping 10/18/2017   Vitamin D deficiency, unspecified 10/18/2017   GERD (gastroesophageal reflux disease) 10/18/2017   Hypertension, essential, benign 09/04/2017   Hyperlipidemia 09/04/2017   Morbid obesity ([place]) 09/04/2017   Vertigo 09/04/2017   Hip pain 05/25/2015   Past Medical History:  Diagnosis Date   Arthritis    Constipation    on stool softener   GERD (gastroesophageal reflux disease)    Hyperlipidemia     Hypertension    Trouble swallowing 2013   following lap band   Urticaria     Family History  Problem Relation Age of Onset   Hypertension Mother    Stroke Mother    Diabetes Father    Alcohol abuse Father    Kidney disease Father    Colon polyps Brother    Cancer Brother    Alcohol abuse Brother     Past Surgical History:  Procedure Laterality Date   ABDOMINAL HYSTERECTOMY     BREAST BIOPSY Left 11/04/2019    BENIGN LYMPH NODE WITH PARACORTICAL HYPERPLASIA   CHOLECYSTECTOMY     COLONOSCOPY N/A 01/08/2018   Procedure: COLONOSCOPY;  Surgeon: Yetta Flock, MD;  Location: [facility];  Service: Gastroenterology;  Laterality: N/A;   LAPAROSCOPIC GASTRIC BANDING     LAPAROSCOPIC REPAIR AND REMOVAL OF GASTRIC BAND  2013-2014   Social History   Occupational History   Not on file  Tobacco Use   Smoking status: Former    Pack years: 0.00    Types: Cigarettes    Quit date: 10/02/1972    Years since quitting: 48.3   Smokeless tobacco: Never  Vaping Use   Vaping Use: Never used  Substance and Sexual Activity   Alcohol use: No   Drug use: No   Sexual activity: Never

## 2021-01-31 ENCOUNTER — Telehealth: Payer: Self-pay | Admitting: Pharmacist

## 2021-01-31 NOTE — Chronic Care Management (AMB) (Signed)
Chronic Care Management Pharmacy Assistant   Name: Jennifer Davila  MRN: [id number] DOB: 1955-10-15  Jennifer Davila is an 65 y.o. year old female who presents for herinitial CCM visit with the clinical pharmacist.  Reason for Encounter: Chart prep for Initial visit with [name] on 02-02-2021   Conditions to be addressed/monitored: HTN, HLD, and Vitamin D deficiency, Hypokalemia and GERD   Recent office visits:   12-28-2020 [name] MD (PCP) - Patient was seen for bilateral leg pain and other problems. Hydrocodone-Acetaminophen 5.3-25mg  started PRN, Benzonatate 100mg  stopped, Losartan-HCTZ  100-25mg  stopped. Patient instructed to follow up in 5 months around 05-30-2021  12-19-2020 Randel Pigg LPN ( PCP) - Patient was seen for Medicare annual wellness exam. No medication changes noted. Pt instructed ro follow up  12-28-2020 with PCP   11-03-2020 Lucretia Kern DO (Family med) - Patient was seen via video visit for Flu like illness. Benzonatate 100mg  started. No follow up visit date noted.   09-21-2020 [name] MD (PCP) - Patient was seen for bilateral leg pain and other problems. No medication changes noted. Patient instructed to return in 5 months around 02-18-2021  Recent consult visits:   01-26-2021 Newt Minion MD (Orthopedic Surg) - Patient was seen for radicular pain of both lower extremities. No medication changes noted. Patient instructed to follow up in about four weeks around 02-23-2021  01-12-2021 Newt Minion MD (Orthopedic Surg) - Patient was seen for bilateral leg pain and other problems. Prednisone 10mg  daily started. Patient instructed for follow up in three weeks around 7-01-28-21   Hospital visits:  None in previous 6 months  Medications: Outpatient Encounter Medications as of 01/31/2021  Medication Sig   acetaminophen (TYLENOL) 500 MG tablet Take 1-2 tablets (500-1,000 mg total) by mouth daily as needed for moderate pain (pain).   amLODipine (NORVASC) 5 MG  tablet TAKE 1 TABLET BY MOUTH EVERYDAY AT BEDTIME   cetirizine (ZYRTEC) 10 MG tablet Take 1 tablet (10 mg total) by mouth 2 (two) times daily.   esomeprazole (NEXIUM) 40 MG capsule TAKE 1 CAPSULE BY MOUTH EVERY DAY   HYDROcodone-acetaminophen (NORCO/VICODIN) 5-325 MG tablet Take 1 tablet by mouth daily as needed for moderate pain.   ibuprofen (ADVIL) 400 MG tablet Take 1 tablet (400 mg total) by mouth daily as needed.   losartan (COZAAR) 100 MG tablet Take 1 tablet (100 mg total) by mouth daily.   montelukast (SINGULAIR) 10 MG tablet Take 1 tablet (10 mg total) by mouth at bedtime.   potassium chloride (KLOR-CON) 10 MEQ tablet TAKE 1 TABLET BY MOUTH EVERY DAY   pravastatin (PRAVACHOL) 20 MG tablet Take 1 tablet (20 mg total) by mouth daily.   predniSONE (DELTASONE) 10 MG tablet Take 1 tablet (10 mg total) by mouth daily with breakfast.   spironolactone (ALDACTONE) 25 MG tablet TAKE 1 TABLET (25 MG TOTAL) BY MOUTH DAILY.   tiZANidine (ZANAFLEX) 4 MG tablet TAKE 1 TABLET BY MOUTH 2 TIMES DAILY AS NEEDED.   No facility-administered encounter medications on file as of 01/31/2021.   Medication FIll History:  esomeprazole (NEXIUM) capsule 12/07/2020 90   ibuprofen (MOTRIN) tablet 10/07/2020 30   losartan-hydrochlorothiazide (HYZAAR) tablet 100-25 mg 11/12/2020 90   montelukast (SINGULAIR) tablet 10 mg 06/24/2020 30   potassium chloride (K-DUR,KLOR-CON) CR tablet 12/06/2020 90   pravastatin (PRAVACHOL) tablet 09/22/2020 90   spironolactone (ALDACTONE) tablet 11/23/2020 90   amLODIpine (NORVASC) tablet 09/21/2020 90   tiZANidine (ZANAFLEX) tablet 10/07/2020 15  Have you seen any other providers since your last visit?  Any changes in your medications or health?  Any side effects from any medications?  Do you have an symptoms or problems not managed by your medications?  Any concerns about your health right now?  Has your provider asked that you check blood pressure, blood sugar, or  follow special diet at home? Do you get any type of exercise on a regular basis?  Can you think of a goal you would like to reach for your health?  Do you have any problems getting your medications?  Is there anything that you would like to discuss during the appointment?   Please bring medications and supplements to appointment.  Notes: Spoke with Merry Proud at [facility] and he stated that the patients losartan with hydrochlorothiazide was inactivated and now patient is just taking losartan 100mg . It was filled on 01/03/21 for 90 days and the pravastatin was filled for 90 days as well on 01/03/21.   Not able to reach patient to complete initial questions.  Star Rating Drugs:  Losartan 100mg - last [facility] 11-12-2020 [facility] Pravastatin Sodium 20mg  - last filled 09-22-2020 90DS at [facility] Pharmacist Assistant [phone]

## 2021-02-01 NOTE — Progress Notes (Signed)
Chronic Care Management Pharmacy Note  02/02/2021 Name:  LENORE MOYANO MRN:  [id number] DOB:  1955-08-31  Summary: LDL not at goal < 100  Recommendations/Changes made from today's visit: -Recommend increasing pravastatin to 40 mg daily to achieve lower LDL -Recommended routine monitoring of BP at home  Plan: Follow up in 3 months   Subjective: PEARLENE TEAT is an 65 y.o. year old female who is a primary patient of Martinique, Malka So, MD.  The CCM team was consulted for assistance with disease management and care coordination needs.    Engaged with patient by telephone for initial visit in response to provider referral for pharmacy case management and/or care coordination services.   Consent to Services:  The patient was given the following information about Chronic Care Management services today, agreed to services, and gave verbal consent: 1. CCM service includes personalized support from designated clinical staff supervised by the primary care provider, including individualized plan of care and coordination with other care providers 2. 24/7 contact phone numbers for assistance for urgent and routine care needs. 3. Service will only be billed when office clinical staff spend 20 minutes or more in a month to coordinate care. 4. Only one practitioner may furnish and bill the service in a calendar month. 5.The patient may stop CCM services at any time (effective at the end of the month) by phone call to the office staff. 6. The patient will be responsible for cost sharing (co-pay) of up to 20% of the service fee (after annual deductible is met). Patient agreed to services and consent obtained.  Patient Care Team: Martinique, [name], MD as PCP - General (Family Medicine) Viona Gilmore, [facility] as Pharmacist (Pharmacist)  Recent office visits: 12/28/20 [name] MD (PCP) - Patient was seen for bilateral leg pain and other problems. Hydrocodone-Acetaminophen 5.3-25mg started PRN,  Benzonatate 158m stopped, Losartan-HCTZ  100-242mstopped and restarted Losartan 100 mg. Decreased Khlor-con from 20 mEq to 10 mEq. Patient instructed to follow up in 5 months.   12/19/20 LaRandel PiggPN ( PCP) - Patient was seen for Medicare annual wellness exam. No medication changes noted.    11/03/20 HaLucretia KernO (Family med) - Patient was seen via video visit for Flu like illness. Benzonatate 10072mtarted. No follow up visit date noted.   09/21/20 Betty JorMartinique (PCP) - Patient was seen for bilateral leg pain and other problems. No medication changes noted. Patient instructed to return in 5 months.  Recent consult visits: 01/26/21 MarNewt Minion (Orthopedic Surg) - Patient was seen for radicular pain of both lower extremities. No medication changes noted. Patient instructed to follow up in about four weeks.   01/12/21 MarNewt Minion (Orthopedic Surg) - Patient was seen for bilateral leg pain and other problems. Prednisone 39m62mily started. Patient instructed for follow up in three weeks.  Hospital visits: None in previous 6 months  Objective:  Lab Results  Component Value Date   CREATININE 0.99 10/07/2020   BUN 11 10/07/2020   GFR 59.23 (L) 10/07/2020   GFRNONAA 59 (L) 04/19/2020   GFRAA 68 04/19/2020   NA 142 10/07/2020   K 4.3 12/28/2020   CALCIUM 9.3 10/07/2020   CO2 30 10/07/2020   GLUCOSE 93 10/07/2020    Lab Results  Component Value Date/Time   HGBA1C 6.4 12/28/2020 03:00 PM   HGBA1C 6.4 09/21/2020 12:24 PM   GFR 59.23 (L) 10/07/2020 09:37 AM   GFR 60.72 09/21/2020  12:24 PM    Last diabetic Eye exam: No results found for: HMDIABEYEEXA  Last diabetic Foot exam: No results found for: HMDIABFOOTEX   Lab Results  Component Value Date   CHOL 185 09/21/2020   HDL 41.90 09/21/2020   LDLCALC 110 (H) 09/21/2020   TRIG 167.0 (H) 09/21/2020   CHOLHDL 4 09/21/2020    Hepatic Function Latest Ref Rng & Units 04/19/2020 08/26/2019 06/26/2018  Total Protein 6.0 -  8.5 g/dL 7.3 7.1 7.3  Albumin 3.8 - 4.8 g/dL 4.6 4.4 4.1  AST 0 - 40 IU/L '17 14 17  ' ALT 0 - 32 IU/L '6 12 14  ' Alk Phosphatase 44 - 121 IU/L 112 99 74  Total Bilirubin 0.0 - 1.2 mg/dL 0.3 0.4 0.7  Bilirubin, Direct 0.1 - 0.5 mg/dL - - -    Lab Results  Component Value Date/Time   TSH 5.709 (H) 08/04/2015 12:08 PM    CBC Latest Ref Rng & Units 09/21/2020 04/19/2020 04/06/2020  WBC 4.0 - 10.5 K/uL 6.9 5.1 5.5  Hemoglobin 12.0 - 15.0 g/dL 12.2 12.6 12.9  Hematocrit 36.0 - 46.0 % 39.0 40.5 41.0  Platelets 150.0 - 400.0 K/uL 311.0 - 296    Lab Results  Component Value Date/Time   VD25OH 38 02/23/2020 10:47 AM   VD25OH 26.17 (L) 12/22/2019 11:53 AM   VD25OH <7.00 (L) 08/26/2019 10:31 AM    Clinical ASCVD: No  The 10-year ASCVD risk score Mikey Bussing DC Jr., et al., 2013) is: 11.1%   Values used to calculate the score:     Age: 25 years     Sex: Female     Is Non-Hispanic African American: Yes     Diabetic: No     Tobacco smoker: No     Systolic Blood Pressure: 488 mmHg     Is BP treated: Yes     HDL Cholesterol: 41.9 mg/dL     Total Cholesterol: 185 mg/dL    Depression screen [facility] 2/9 12/19/2020 12/19/2020 08/27/2019  Decreased Interest 0 0 0  Down, Depressed, Hopeless 0 0 0  PHQ - 2 Score 0 0 0      Social History   Tobacco Use  Smoking Status Former   Pack years: 0.00   Types: Cigarettes   Quit date: 10/02/1972   Years since quitting: 48.3  Smokeless Tobacco Never   BP Readings from Last 3 Encounters:  12/28/20 136/80  09/21/20 128/70  06/15/20 (!) 165/90   Pulse Readings from Last 3 Encounters:  12/28/20 84  09/21/20 86  06/15/20 78   Wt Readings from Last 3 Encounters:  12/28/20 251 lb 4 oz (114 kg)  09/21/20 251 lb (113.9 kg)  06/15/20 246 lb 12.8 oz (111.9 kg)   BMI Readings from Last 3 Encounters:  12/28/20 47.47 kg/m  09/21/20 47.43 kg/m  06/15/20 46.63 kg/m    Assessment/Interventions: Review of patient past medical history, allergies, medications,  health status, including review of consultants reports, laboratory and other test data, was performed as part of comprehensive evaluation and provision of chronic care management services.   SDOH:  (Social Determinants of Health) assessments and interventions performed: Yes SDOH Interventions    Flowsheet Row Most Recent Value  SDOH Interventions   Financial Strain Interventions Intervention Not Indicated  Transportation Interventions Intervention Not Indicated      SDOH Screenings   Alcohol Screen: Not on file  Depression (PHQ2-9): Low Risk    PHQ-2 Score: 0  Financial Resource Strain: Medium Risk  Difficulty of Paying Living Expenses: Somewhat hard  Food Insecurity: Not on file  Housing: [place] Risk Score: 0  Physical Activity: Insufficiently Active   Days of Exercise per Week: 2 days   Minutes of Exercise per Session: 30 min  Social Connections: Socially Integrated   Frequency of Communication with Friends and Family: Twice a week   Frequency of Social Gatherings with Friends and Family: Twice a week   Attends Religious Services: More than 4 times per year   Active Member of [employer] or Organizations: Yes   Attends Archivist Meetings: 1 to 4 times per year   Marital Status: Married  Stress: Not on file  Tobacco Use: Medium Risk   Smoking Tobacco Use: Former   Smokeless Tobacco Use: Never  Transportation Needs: No Data processing manager (Medical): No   Lack of Transportation (Non-Medical): No   Patient reads a lot in her spare time and also does all of the cooking and cleaning in her house. She does live with her husband and does not have family nearby. She stills works but only on Tuesdays and Fridays.  Patient cooks at home all the time as the cost of going out to eat is too high. She doesn't follow any particular diet but does eat cheaper foods. She reports the cost of chicken is too high for her and eats more hamburger  and hot dogs. She tries to have vegetables with every meal but isn't always able to due to cost.  Patient drinks water throughout the day and coffee in the morning. She sometimes drinks juice but very little tea and no soda. Patient does walk around the house but does not do any structured exercise. She is afraid to walk outside due to big dogs in her neighborhood and they are able to roam freely.  She does sleep well and denies any problems falling or staying asleep. She doesn't really feel tired during the day but does take naps every day.   Patient does not know of any side effects with her medications but is unsure if they are all working. She is pretty familiar with all of them and what they are for.  CCM Care Plan  No Known Allergies  Medications Reviewed Today     Reviewed by Viona Gilmore, [facility] (Pharmacist) on 02/02/21 at [phone]  Med List Status: <None>   Medication Order Taking? Sig Documenting Provider Last Dose Status Informant  acetaminophen (TYLENOL) 500 MG tablet [id number] Yes Take 1-2 tablets (500-1,000 mg total) by mouth daily as needed for moderate pain (pain). Martinique, [name], MD Taking Active   amLODipine (NORVASC) 5 MG tablet [id number] Yes TAKE 1 TABLET BY MOUTH EVERYDAY AT BEDTIME Martinique, [name], MD Taking Active   esomeprazole (NEXIUM) 40 MG capsule [id number] Yes TAKE 1 CAPSULE BY MOUTH EVERY DAY Martinique, [name], MD Taking Active   HYDROcodone-acetaminophen (NORCO/VICODIN) 5-325 MG tablet [id number] No Take 1 tablet by mouth daily as needed for moderate pain.  Patient not taking: Reported on 02/02/2021   Martinique, [name], MD Not Taking Active   ibuprofen (ADVIL) 400 MG tablet [id number] Yes Take 1 tablet (400 mg total) by mouth daily as needed. Martinique, [name], MD Taking Active   losartan (COZAAR) 100 MG tablet [id number] Yes Take 1 tablet (100 mg total) by mouth daily. Martinique, [name], MD Taking Active   potassium chloride (KLOR-CON) 10 MEQ tablet [id number] Yes TAKE 1  TABLET BY MOUTH EVERY DAY Martinique, [name], MD Taking Active   pravastatin (PRAVACHOL) 20 MG tablet [id number] Yes Take 1 tablet (20 mg total) by mouth daily. Martinique, [name], MD Taking Active   spironolactone (ALDACTONE) 25 MG tablet [id number] Yes TAKE 1 TABLET (25 MG TOTAL) BY MOUTH DAILY. Martinique, [name], MD Taking Active   tiZANidine (ZANAFLEX) 4 MG tablet [id number] No TAKE 1 TABLET BY MOUTH 2 TIMES DAILY AS NEEDED.  Patient not taking: Reported on 02/02/2021   Martinique, [name], MD Not Taking Active             Patient Active Problem List   Diagnosis Date Noted   Bilateral leg pain 06/03/2020   Chronic urticaria 05/26/2020   Seasonal and perennial allergic rhinitis 05/26/2020   Positive ANA (antinuclear antibody) 05/26/2020   Iron deficiency anemia 07/07/2018   Hematochezia 06/26/2018   Diverticulosis of colon with hemorrhage    Leg pain 01/06/2018   Bilateral lower extremity edema 01/06/2018   Lower GI bleed 01/06/2018   Hypokalemia 01/06/2018   Constipation 01/06/2018   Leg cramping 10/18/2017   Vitamin D deficiency, unspecified 10/18/2017   GERD (gastroesophageal reflux disease) 10/18/2017   Hypertension, essential, benign 09/04/2017   Hyperlipidemia 09/04/2017   Morbid obesity ([place]) 09/04/2017   Vertigo 09/04/2017   Hip pain 05/25/2015    Immunization History  Administered Date(s) Administered   Fluad Quad(high Dose 65+) 06/15/2020   Hepatitis B, adult 10/13/2019, 11/17/2019, 05/18/2020   Influenza, High Dose Seasonal PF 04/29/2019   Influenza,inj,Quad PF,6+ Mos 05/23/2015, 05/03/2016, 04/23/2018   Moderna Sars-Covid-2 Vaccination 01/08/2020, 02/05/2020   PPD Test 05/14/2018   Pneumococcal Conjugate-13 08/04/2015   Pneumococcal Polysaccharide-23 08/26/2019   Tdap 05/23/2015    Conditions to be addressed/monitored:  Hypertension, Hyperlipidemia, GERD, and Pain and prediabetes  Care Plan : [place]  Updates made by Viona Gilmore, [place] since  02/02/2021 12:00 AM     Problem: Problem: Hypertension, Hyperlipidemia, GERD, and Pain and prediabetes      Long-Range Goal: Patient-Specific Goal   Start Date: 02/02/2021  Expected End Date: 02/02/2022  This Visit's Progress: On track  Priority: High  Note:   Current Barriers:  Unable to independently afford treatment regimen Unable to independently monitor therapeutic efficacy Unable to achieve control of cholesterol   Pharmacist Clinical Goal(s):  Patient will verbalize ability to afford treatment regimen achieve adherence to monitoring guidelines and medication adherence to achieve therapeutic efficacy achieve control of cholesterol as evidenced by next lipid panel maintain control of blood pressure as evidenced by home BP readings  through collaboration with PharmD and provider.   Interventions: 1:1 collaboration with Martinique, [name], MD regarding development and update of comprehensive plan of care as evidenced by provider attestation and co-signature Inter-disciplinary care team collaboration (see longitudinal plan of care) Comprehensive medication review performed; medication list updated in electronic medical record  Hypertension (BP goal <140/90) -Not ideally controlled -Current treatment: Losartan 100 mg 1 tablet daily Amlodipine 5 mg 1 tablet daily at bedtime Spironolactone 25 mg 1 tablet daily -Medications previously tried: HCTZ (low potassium)  -Current home readings: does not check at home -Current dietary habits: tries not to cook with salt; does eat red meat and hamburgers and not often chicken or ground [place] -Current exercise habits: does not exercise -Denies hypotensive/hypertensive symptoms -Educated on BP goals and benefits of medications for prevention of heart attack, stroke and kidney damage; Daily salt intake goal < 2300 mg; Exercise goal of  150 minutes per week; Importance of home blood pressure monitoring; Proper BP monitoring technique; -Counseled  to monitor BP at home weekly, document, and provide log at future appointments -Counseled on diet and exercise extensively Recommended to continue current medication Recommended using youtube as a source of exercise videos  Hyperlipidemia: (LDL goal < 100) -Uncontrolled -Current treatment: Pravastatin 20 mg 1 tablet daily -Medications previously tried:  none -Current dietary patterns: no fried foods; uses vegetable oil instead of canola or olive oil due to cost; eats some fruit and vegetables (doesn't like oatmeal) -Current exercise habits: does not exercise -Educated on Cholesterol goals;  Benefits of statin for ASCVD risk reduction; Importance of limiting foods high in cholesterol; Exercise goal of 150 minutes per week; -Counseled on diet and exercise extensively Recommended to continue current medication  Pre-diabetes (A1c goal <6.5%) -Controlled -Current medications: No medications -Medications previously tried: none  -Current home glucose readings fasting glucose: does not need to check post prandial glucose: does not need to check -Denies hypoglycemic/hyperglycemic symptoms -Current meal patterns:  breakfast: sometimes she skips or eats 2 pieces of bread  lunch: does not eat  dinner: only meal of the day - protein and tries to have a vegetable snacks: cheezits, grapes drinks: water, coffee, some juice -Current exercise: does not exercise -Educated on A1c and blood sugar goals; Exercise goal of 150 minutes per week; Benefits of weight loss; Carbohydrate counting and/or plate method -Counseled to check feet daily and get yearly eye exams -Counseled on diet and exercise extensively Counseled on adding protein to meals and eating smaller meals throughout the day instead of one big meal. Patient will try to move her snacks to earlier in the day instead of bedtime and will add veggies instead of fruit & cheezits (both carbs)  GERD (Goal: minimize  symptoms) -Controlled -Current treatment  Esomeprazole 40 mg 1 capsule daily -Medications previously tried: none  -Counseled on non-pharmacologic management of symptoms such as elevating the head of your bed, avoiding eating 2-3 hours before bed, avoiding triggering foods such as acidic, spicy, or fatty foods, eating smaller meals, and wearing clothes that are loose around the waist Recommended considering long term risks of PPIs and taper in the future to see how she tolerates. Patient preferred to hold off for now.  Pain (Goal: minimize pain) -Uncontrolled -Current treatment  Acetaminophen 500 mg 1-2 tablets daily as needed for pain Ibuprofen 400 mg daily as needed (on Sundays) Hydrocodone-APAP 5-325 1 tablet daily as needed for pain -Medications previously tried: tizanidine (made her drowsy)  -Counseled on maximum of 3000 mg of Tylenol per day (6 tablets) and can take every 4-6 hours as needed.  Health Maintenance -Vaccine gaps: shingrix (has not had chicken pox), COVID booster -Current therapy:  Potassium 10 mEq 1 tablet daily -Educated on Cost vs benefit of each product must be carefully weighed by individual consumer -Patient is satisfied with current therapy and denies issues -Recommended to continue current medication  Patient Goals/Self-Care Activities Patient will:  - take medications as prescribed check blood pressure weekly, document, and provide at future appointments target a minimum of 150 minutes of moderate intensity exercise weekly  Follow Up Plan: Telephone follow up appointment with care management team member scheduled for:3 months        Medication Assistance: None required.  Patient affirms current coverage meets needs.  Compliance/Adherence/Medication fill history: Care Gaps: Shingrix, COVID booster  Star-Rating Drugs: Losartan 100 mg - [facility] Pravastatin Sodium 32m -  last filled [facility]-22-2020 [facility]  Patient[facility]  [facility]  [id number] [place] [place]Phone: [phone]: [phone] [facility] [place] [address] [place]Phone: [phone]: [phone] Uses pill box? No - keeps them beside her Pt endorses 100% compliance  We discussed: Benefits of medication synchronization, packaging and delivery as well as enhanced pharmacist oversight with [employer]. Patient decided to: Continue current medication management strategy  Care Plan and Follow Up Patient Decision:  Patient agrees to Care Plan and Follow-up.  Plan: Telephone follow up appointment with care management team member scheduled for:  3 months  MJeni Salles PharmD, [place]oorheadat [phone]  -sometimes she doesn't have the funds  3 months - between 10-11 am  -working tuesdays and fridays

## 2021-02-02 ENCOUNTER — Ambulatory Visit (INDEPENDENT_AMBULATORY_CARE_PROVIDER_SITE_OTHER): Payer: Medicare HMO | Admitting: Pharmacist

## 2021-02-02 DIAGNOSIS — I10 Essential (primary) hypertension: Secondary | ICD-10-CM

## 2021-02-02 DIAGNOSIS — E785 Hyperlipidemia, unspecified: Secondary | ICD-10-CM | POA: Diagnosis not present

## 2021-02-02 DIAGNOSIS — R7303 Prediabetes: Secondary | ICD-10-CM

## 2021-02-02 NOTE — Patient Instructions (Addendum)
Hi Jennifer Davila,  It was great to get to meet you over the telephone! Below is a summary of some of the topics we discussed. I also attached some more helpful diet information for lowering your cholesterol and following a heart healthy diet.  Please reach out to me if you have any questions or need anything before our follow up!  Best, Maddie  [name], PharmD, [facility]   Visit Information   Goals Addressed             This Visit's Progress    Track and Manage My Blood Pressure-Hypertension       Timeframe:  Short-Term Goal Priority:  Medium Start Date:                             Expected End Date:                       Follow Up Date 05/05/21    - check blood pressure weekly - choose a place to take my blood pressure (home, clinic or office, retail store) - write blood pressure results in a log or diary    Why is this important?   You won't feel high blood pressure, but it can still hurt your blood vessels.  High blood pressure can cause heart or kidney problems. It can also cause a stroke.  Making lifestyle changes like losing a little weight or eating less salt will help.  Checking your blood pressure at home and at different times of the day can help to control blood pressure.  If the doctor prescribes medicine remember to take it the way the doctor ordered.  Call the office if you cannot afford the medicine or if there are questions about it.     Notes:         Patient Care Plan: CCM Pharmacy Care Plan     Problem Identified: Problem: Hypertension, Hyperlipidemia, GERD, and Pain and prediabetes      Long-Range Goal: Patient-Specific Goal   Start Date: 02/02/2021  Expected End Date: 02/02/2022  This Visit's Progress: On track  Priority: High  Note:   Current Barriers:  Unable to independently afford treatment regimen Unable to independently monitor therapeutic efficacy Unable to achieve  control of cholesterol   Pharmacist Clinical Goal(s):  Patient will verbalize ability to afford treatment regimen achieve adherence to monitoring guidelines and medication adherence to achieve therapeutic efficacy achieve control of cholesterol as evidenced by next lipid panel maintain control of blood pressure as evidenced by home BP readings  through collaboration with PharmD and provider.   Interventions: 1:1 collaboration with [place], [name], MD regarding development and update of comprehensive plan of care as evidenced by provider attestation and co-signature Inter-disciplinary care team collaboration (see longitudinal plan of care) Comprehensive medication review performed; medication list updated in electronic medical record  Hypertension (BP goal <140/90) -Not ideally controlled -Current treatment: Losartan 100 mg 1 tablet daily Amlodipine 5 mg 1 tablet daily at bedtime Spironolactone 25 mg 1 tablet daily -Medications previously tried: HCTZ (low potassium)  -Current home readings: does not check at home -Current dietary habits: tries not to cook with salt; does eat red meat and hamburgers and not often chicken or ground [place] -Current exercise habits: does not exercise -Denies hypotensive/hypertensive symptoms -Educated on BP goals and benefits of medications for prevention of heart attack, stroke and kidney damage;  Daily salt intake goal < 2300 mg; Exercise goal of 150 minutes per week; Importance of home blood pressure monitoring; Proper BP monitoring technique; -Counseled to monitor BP at home weekly, document, and provide log at future appointments -Counseled on diet and exercise extensively Recommended to continue current medication Recommended using youtube as a source of exercise videos  Hyperlipidemia: (LDL goal < 100) -Uncontrolled -Current treatment: Pravastatin 20 mg 1 tablet daily -Medications previously tried:  none -Current dietary patterns: no fried  foods; uses vegetable oil instead of canola or olive oil due to cost; eats some fruit and vegetables (doesn't like oatmeal) -Current exercise habits: does not exercise -Educated on Cholesterol goals;  Benefits of statin for ASCVD risk reduction; Importance of limiting foods high in cholesterol; Exercise goal of 150 minutes per week; -Counseled on diet and exercise extensively Recommended to continue current medication  Pre-diabetes (A1c goal <6.5%) -Controlled -Current medications: No medications -Medications previously tried: none  -Current home glucose readings fasting glucose: does not need to check post prandial glucose: does not need to check -Denies hypoglycemic/hyperglycemic symptoms -Current meal patterns:  breakfast: sometimes she skips or eats 2 pieces of bread  lunch: does not eat  dinner: only meal of the day - protein and tries to have a vegetable snacks: cheezits, grapes drinks: water, coffee, some juice -Current exercise: does not exercise -Educated on A1c and blood sugar goals; Exercise goal of 150 minutes per week; Benefits of weight loss; Carbohydrate counting and/or plate method -Counseled to check feet daily and get yearly eye exams -Counseled on diet and exercise extensively Counseled on adding protein to meals and eating smaller meals throughout the day instead of one big meal. Patient will try to move her snacks to earlier in the day instead of bedtime and will add veggies instead of fruit & cheezits (both carbs)  GERD (Goal: minimize symptoms) -Controlled -Current treatment  Esomeprazole 40 mg 1 capsule daily -Medications previously tried: none  -Counseled on non-pharmacologic management of symptoms such as elevating the head of your bed, avoiding eating 2-3 hours before bed, avoiding triggering foods such as acidic, spicy, or fatty foods, eating smaller meals, and wearing clothes that are loose around the waist Recommended considering long term risks of  PPIs and taper in the future to see how she tolerates. Patient preferred to hold off for now.  Pain (Goal: minimize pain) -Uncontrolled -Current treatment  Acetaminophen 500 mg 1-2 tablets daily as needed for pain Ibuprofen 400 mg daily as needed (on Sundays) Hydrocodone-APAP 5-325 1 tablet daily as needed for pain -Medications previously tried: tizanidine (made her drowsy)  -Counseled on maximum of 3000 mg of Tylenol per day (6 tablets) and can take every 4-6 hours as needed.  Health Maintenance -Vaccine gaps: shingrix (has not had chicken pox), COVID booster -Current therapy:  Potassium 10 mEq 1 tablet daily -Educated on Cost vs benefit of each product must be carefully weighed by individual consumer -Patient is satisfied with current therapy and denies issues -Recommended to continue current medication  Patient Goals/Self-Care Activities Patient will:  - take medications as prescribed check blood pressure weekly, document, and provide at future appointments target a minimum of 150 minutes of moderate intensity exercise weekly  Follow Up Plan: Telephone follow up appointment with care management team member scheduled for:3 months      Ms. Agrusa was given information about Chronic Care Management services today including:  CCM service includes personalized support from designated clinical staff supervised by her physician, including individualized plan  of care and coordination with other care providers 24/7 contact phone numbers for assistance for urgent and routine care needs. Standard insurance, coinsurance, copays and deductibles apply for chronic care management only during months in which we provide at least 20 minutes of these services. Most insurances cover these services at 100%, however patients may be responsible for any copay, coinsurance and/or deductible if applicable. This service may help you avoid the need for more expensive face-to-face services. Only one  practitioner may furnish and bill the service in a calendar month. The patient may stop CCM services at any time (effective at the end of the month) by phone call to the office staff.  Patient agreed to services and verbal consent obtained.   The patient verbalized understanding of instructions, educational materials, and care plan provided today and agreed to receive a mailed copy of patient instructions, educational materials, and care plan.  Telephone follow up appointment with pharmacy team member scheduled for: 3 months  Viona Gilmore, [facility]

## 2021-02-08 ENCOUNTER — Encounter: Payer: Self-pay | Admitting: Rehabilitative and Restorative Service Providers"

## 2021-02-08 ENCOUNTER — Other Ambulatory Visit: Payer: Self-pay

## 2021-02-08 ENCOUNTER — Ambulatory Visit: Payer: Medicare HMO | Admitting: Rehabilitative and Restorative Service Providers"

## 2021-02-08 ENCOUNTER — Other Ambulatory Visit: Payer: Self-pay | Admitting: Orthopedic Surgery

## 2021-02-08 DIAGNOSIS — M79604 Pain in right leg: Secondary | ICD-10-CM | POA: Diagnosis not present

## 2021-02-08 DIAGNOSIS — R262 Difficulty in walking, not elsewhere classified: Secondary | ICD-10-CM | POA: Diagnosis not present

## 2021-02-08 DIAGNOSIS — M79605 Pain in left leg: Secondary | ICD-10-CM

## 2021-02-08 DIAGNOSIS — M5416 Radiculopathy, lumbar region: Secondary | ICD-10-CM

## 2021-02-08 DIAGNOSIS — R293 Abnormal posture: Secondary | ICD-10-CM | POA: Diagnosis not present

## 2021-02-08 NOTE — Therapy (Signed)
[facility] [address] [place], [place], [address] Phone: [phone]   Fax:  [phone]  Physical Therapy Evaluation  Patient Details  Name: Jennifer Davila MRN: [id number] Date of Birth: [birth year] Referring Provider (PT): Newt Minion MD  Referring diagnosis? M54.10 Treatment diagnosis? (if different than referring diagnosis) M54.16  R29.3  R26.2 What was this (referring dx) caused by? []  Surgery []  Fall [x]  Ongoing issue [x]  Arthritis []  Other: ____________  Laterality: []  Rt []  Lt [x]  Both  Check all possible CPT codes:      []  97110 (Therapeutic Exercise)  []  92507 (SLP Treatment)  []  95284 (Neuro Re-ed)   []  92526 (Swallowing Treatment)   []  13244 (Gait Training)   []  [id number] (Cognitive Training, 1st 15 minutes) []  01027 (Manual Therapy)   []  97130 (Cognitive Training, each add'l 15 minutes)  []  97530 (Therapeutic Activities)  []  Other, List CPT Code ____________    []  25366 (Self Care)       [x]  All codes above (97110 - 97535)  [x]  44034 (Mechanical Traction)  []  97014 (E-stim Unattended)  []  97032 (E-stim manual)  []  97033 (Ionto)  []  97035 (Ultrasound)  []  97760 (Orthotic Fit) [x]  [id number] (Physical Performance Training) []  [id number] (Aquatic Therapy) []  97034 (Contrast Bath) []  97018 (Paraffin) []  97597 (Wound Care 1st 20 sq cm) []  97598 (Wound Care each add'l 20 sq cm) []  97016 (Vasopneumatic Device) []  [id number] (Orthotic Training) []  [id number] (Prosthetic Training)  Encounter Date: 02/08/2021   PT End of Session - 02/08/21 1634     Visit Number 1    Number of Visits 10    Authorization Type [employer]    Progress Note Due on Visit 10    PT Start Time 1015    PT Stop Time 1100    PT Time Calculation (min) 45 min    Activity Tolerance Patient tolerated treatment well;Patient limited by fatigue    Behavior During Therapy [facility] for tasks assessed/performed             Past Medical History:  Diagnosis Date   Arthritis     Constipation    on stool softener   GERD (gastroesophageal reflux disease)    Hyperlipidemia    Hypertension    Trouble swallowing 2013   following lap band   Urticaria     Past Surgical History:  Procedure Laterality Date   ABDOMINAL HYSTERECTOMY     BREAST BIOPSY Left 11/04/2019    BENIGN LYMPH NODE WITH PARACORTICAL HYPERPLASIA   CHOLECYSTECTOMY     COLONOSCOPY N/A 01/08/2018   Procedure: COLONOSCOPY;  Surgeon: Yetta Flock, MD;  Location: [facility];  Service: Gastroenterology;  Laterality: N/A;   LAPAROSCOPIC GASTRIC BANDING     LAPAROSCOPIC REPAIR AND REMOVAL OF GASTRIC BAND  2013-2014    There were no vitals filed for this visit.    Subjective Assessment - 02/08/21 1628     Subjective [name] has a ~ 6 month history of intermittent B leg pain.  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    Pertinent History HTN, morbid obesity, arthritis    Limitations Reading;House hold activities;Lifting;Standing;Walking    How long can you sit comfortably? Sits for hours but gets uncomfortable    How long can you stand comfortably? < 30 minutes    How long can you walk comfortably? Walks functionally, walking for exercise encouraged    Patient Stated Goals  Decrease pain and work more comfortably    Currently in Pain? 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  No    Is the patient reluctant to leave their home because of a fear of falling?  No      Home Ecologist residence    Additional Comments On a hill and 6 steps      Prior Function   Level of Independence Independent    Vocation Part time employment    Patent examiner    Leisure Read, computers      Cognition   Overall Cognitive Status Within Functional Limits for tasks assessed      Observation/Other Assessments   Focus on Therapeutic Outcomes (FOTO)  50 (Goal 59 on visit 11)      ROM / Strength   AROM / PROM / Strength AROM      AROM   Overall AROM  Deficits    AROM Assessment Site Lumbar;Hip    Right/Left Hip Left;Right    Right Hip Flexion 90    Right Hip External Rotation  26    Right Hip Internal Rotation  12    Left Hip Flexion 90    Left Hip External Rotation  30    Left Hip Internal Rotation  8    Lumbar Extension 10      Flexibility   Soft Tissue Assessment /Muscle Length yes    Hamstrings 40/45 degrees                        Objective measurements completed on examination: See above findings.       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, counselling Activities Other Therapeutic Activities    Other Therapeutic Activities Discussed imaging, posture and body mechanics with reading, working as a Secretary/administrator and reviewed starter HEP      Exercises    Exercises Lumbar      Lumbar Exercises: Stretches   Figure 4 Stretch 4 reps;20  seconds      Lumbar Exercises: Standing   Row Strengthening;Both;10 reps;Limitations    Row Limitations 5 seconds    Other Standing Lumbar Exercises Trunk extension AROM 10X 3 seconds                    PT Education - 02/08/21 1633     Education Details Reviewed imaging, spine anatomy, posture, body mechanics with cleaning and reading, exam findings and starter HEP.    Person(s) Educated Patient    Methods Explanation;Demonstration;Verbal cues;Tactile cues;Handout    Comprehension Verbal cues required;Need further instruction;Returned demonstration;Verbalized understanding;Tactile cues required              PT Short Term Goals - 02/08/21 1640       PT SHORT TERM GOAL #1   Title [name] will report reduced frequency and intensity of LE symptoms (0-3/10 VAS).    Baseline Can be 5+/10    Time 4    Period Weeks    Status New    Target Date 03/08/21      PT SHORT TERM GOAL #2   Title [name] will be independent with her starter HEP.    Time 4    Period Weeks    Status New    Target Date 03/08/21               PT Long Term Goals - 02/08/21 1641       PT LONG TERM GOAL #1   Title Improve FOTO to 59.    Baseline 50    Time 8    Period Weeks    Status New    Target Date 04/05/21      PT LONG TERM GOAL #2   Title [name] will report B leg pain no > 2/10 on the Numeric Pain Rating Scale.    Baseline Can be 5+/10    Time 8    Period Weeks    Status New    Target Date 04/05/21      PT LONG TERM GOAL #3   Title Improve B hip and trunk extension flexibility/AROM.    Baseline See objective.    Time 8    Period Weeks    Status New    Target Date 04/05/21      PT LONG TERM GOAL #4   Title Improve B leg and low back strength as assessed by subjective self-report and FOTO scores.    Time 8    Period Weeks    Status New    Target Date 04/05/21      PT LONG TERM GOAL  #5   Title [name] will be independent with her long-term HEP at [place].    Time 8    Period Weeks    Status New    Target Date 04/05/21                    Plan - 02/08/21 1635     Clinical Impression Statement [name] has had B lumbar radiculopathy for ~ 6 months.  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  She will benefit from general conditioning focused on low back and B leg strengthening, postural and practical body mechanics work.  She also tends to hyperextend her knees in standing and had to be cued to avoid this.  Her prognosis to meet LTGs is good with the recommended course of  rehabilitation.    Personal Factors and Comorbidities Comorbidity 3+    Comorbidities HTN, morbid obesity, arthritis    Examination-Activity Limitations Sit;Transfers;Bed Mobility;Bend;Lift;Squat;Stairs;Locomotion Level;Carry;Stand    Examination-Participation Restrictions Occupation;Cleaning;Community Activity    Stability/Clinical Decision Making Stable/Uncomplicated    Clinical Decision Making Low    Rehab Potential Good    PT Frequency Other (comment)   1-2X/week   PT Duration 8 weeks    PT Treatment/Interventions ADLs/Self Care Home Management;Cryotherapy;Traction;Therapeutic activities;Gait training;Therapeutic exercise;Neuromuscular re-education;Patient/family education;Dry needling    PT Next Visit Plan Review HEP, practical body mechanics, general leg and low back strengthening    PT Home Exercise Plan Access Code: [email or web address]  URL: [email or web address]  Date: 02/08/2021  Prepared by: Vista Mink    Exercises  Standing Lumbar Extension at [place] 5 x daily - 7 x weekly - 1 sets - 5 reps - 3 seconds hold  Supine Figure 4 Piriformis Stretch - 2 x daily - 7 x weekly - 1 sets - 5 reps - 20 seconds hold  Standing Scapular Retraction - 5 x daily - 7 x weekly - 1 sets - 5 reps - 5 second  hold    Consulted and Agree with Plan of Care Patient             Patient will benefit from skilled therapeutic intervention in order to improve the following deficits and impairments:  Abnormal gait, Decreased activity tolerance, Decreased endurance, Decreased range of motion, Decreased strength, Difficulty walking, Impaired flexibility, Postural dysfunction, Improper body mechanics, Pain  Visit Diagnosis: Abnormal posture  Difficulty walking  Radiculopathy, lumbar region  Bilateral leg pain     Problem List Patient Active Problem List   Diagnosis Date Noted   Bilateral leg pain 06/03/2020   Chronic urticaria 05/26/2020   Seasonal and perennial allergic rhinitis 05/26/2020   Positive ANA (antinuclear antibody) 05/26/2020   Iron deficiency anemia 07/07/2018   Hematochezia 06/26/2018   Diverticulosis of colon with hemorrhage    Leg pain 01/06/2018   Bilateral lower extremity edema 01/06/2018   Lower GI bleed 01/06/2018   Hypokalemia 01/06/2018   Constipation 01/06/2018   Leg cramping 10/18/2017   Vitamin D deficiency, unspecified 10/18/2017   GERD (gastroesophageal reflux disease) 10/18/2017   Hypertension, essential, benign 09/04/2017   Hyperlipidemia 09/04/2017   Morbid obesity ([place]) 09/04/2017   Vertigo 09/04/2017   Hip pain 05/25/2015    Farley Ly PT, MPT 02/08/2021, 4:45 PM  [facility] [address] [place], [place], [address] Phone: [phone]   Fax:  [phone]  Name: CAYLA WIEGAND MRN: [id number] Date of Birth: May 27, 1956

## 2021-02-08 NOTE — Telephone Encounter (Signed)
Do you mind calling, if she is no better we'll order the lumbar mri, does she still feel like the pred is helping, can refill if yes

## 2021-02-08 NOTE — Patient Instructions (Signed)
Access Code: [email or web address] URL: [email or web address] Date: 02/08/2021 Prepared by: Vista Mink  Exercises Standing Lumbar Extension at [place] 5 x daily - 7 x weekly - 1 sets - 5 reps - 3 seconds hold Supine Figure 4 Piriformis Stretch - 2 x daily - 7 x weekly - 1 sets - 5 reps - 20 seconds hold Standing Scapular Retraction - 5 x daily - 7 x weekly - 1 sets - 5 reps - 5 second hold

## 2021-02-08 NOTE — Telephone Encounter (Signed)
I called patient, left voicemail requesting return call.

## 2021-02-09 NOTE — Telephone Encounter (Signed)
Called patient no answer LMOM to return our call.  Need to advise on message below.

## 2021-02-20 ENCOUNTER — Ambulatory Visit: Payer: Medicare HMO | Admitting: Family Medicine

## 2021-02-23 ENCOUNTER — Ambulatory Visit: Payer: Medicare HMO | Admitting: Orthopedic Surgery

## 2021-02-23 DIAGNOSIS — M79605 Pain in left leg: Secondary | ICD-10-CM

## 2021-02-23 DIAGNOSIS — I872 Venous insufficiency (chronic) (peripheral): Secondary | ICD-10-CM

## 2021-02-23 DIAGNOSIS — M79604 Pain in right leg: Secondary | ICD-10-CM

## 2021-03-01 ENCOUNTER — Other Ambulatory Visit: Payer: Self-pay

## 2021-03-01 ENCOUNTER — Encounter: Payer: Self-pay | Admitting: Rehabilitative and Restorative Service Providers"

## 2021-03-01 ENCOUNTER — Ambulatory Visit: Payer: Medicare HMO | Admitting: Rehabilitative and Restorative Service Providers"

## 2021-03-01 DIAGNOSIS — M79605 Pain in left leg: Secondary | ICD-10-CM | POA: Diagnosis not present

## 2021-03-01 DIAGNOSIS — M79604 Pain in right leg: Secondary | ICD-10-CM

## 2021-03-01 DIAGNOSIS — R293 Abnormal posture: Secondary | ICD-10-CM

## 2021-03-01 DIAGNOSIS — R262 Difficulty in walking, not elsewhere classified: Secondary | ICD-10-CM

## 2021-03-01 DIAGNOSIS — M5416 Radiculopathy, lumbar region: Secondary | ICD-10-CM | POA: Diagnosis not present

## 2021-03-01 NOTE — Patient Instructions (Signed)
Access Code: [email or web address] URL: [email or web address] Date: 03/01/2021 Prepared by: Scot Jun  Exercises Standing Lumbar Extension at [place] 5 x daily - 7 x weekly - 1 sets - 5 reps - 3 seconds hold Supine Figure 4 Piriformis Stretch - 2 x daily - 7 x weekly - 1 sets - 5 reps - 30 seconds hold Standing Scapular Retraction - 5 x daily - 7 x weekly - 1 sets - 5 reps - 5 second hold Supine Hamstring Stretch with Strap - 2 x daily - 7 x weekly - 1 sets - 5 reps - 30 hold Supine Gluteal Sets - 2 x daily - 7 x weekly - 1 sets - 10 reps - 5 hold Supine Lower Trunk Rotation - 2 x daily - 7 x weekly - 5 reps - 1 sets - 15 hold

## 2021-03-01 NOTE — Therapy (Signed)
[facility] [address] [place], [place], [address] Phone: [phone]   Fax:  [phone]  Physical Therapy Treatment  Patient Details  Name: Jennifer Davila MRN: [id number] Date of Birth: August 25, 1955 Referring Provider (PT): Jennifer Minion MD   Encounter Date: 03/01/2021   PT End of Session - 03/01/21 1043     Visit Number 2    Number of Visits 12   changed to reflect HUMANA approval #   Authorization Type [employer]    Authorization Time Period 02/08/2021- 04/07/2021    Progress Note Due on Visit 10    PT Start Time 1048    PT Stop Time 1130    PT Time Calculation (min) 42 min    Activity Tolerance Patient tolerated treatment well;Patient limited by fatigue    Behavior During Therapy [facility] for tasks assessed/performed             Past Medical History:  Diagnosis Date   Arthritis    Constipation    on stool softener   GERD (gastroesophageal reflux disease)    Hyperlipidemia    Hypertension    Trouble swallowing 2013   following lap band   Urticaria     Past Surgical History:  Procedure Laterality Date   ABDOMINAL HYSTERECTOMY     BREAST BIOPSY Left 11/04/2019    BENIGN LYMPH NODE WITH PARACORTICAL HYPERPLASIA   CHOLECYSTECTOMY     COLONOSCOPY N/A 01/08/2018   Procedure: COLONOSCOPY;  Surgeon: Jennifer Flock, MD;  Location: [facility];  Service: Gastroenterology;  Laterality: N/A;   LAPAROSCOPIC GASTRIC BANDING     LAPAROSCOPIC REPAIR AND REMOVAL OF GASTRIC BAND  2013-2014    There were no vitals filed for this visit.   Subjective Assessment - 03/01/21 1047     Subjective Pt. saw MD last week.  Pt. indicated she talked with MD about feet swelling (got socks to wear which helped some when she wears).  Pt. indicated symptoms are still present.  Pt. indicated working on exercises.  Pt. stated feeling some better from them.    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    Patient Stated Goals Decrease pain and work more comfortably    Currently in Pain? Yes    Pain Score 5     Pain Orientation Left;Right    Pain Descriptors / Indicators Tightness    Pain Type Chronic pain    Pain Onset More than a month ago    Pain Frequency Intermittent    Aggravating Factors  no specific reported worsening actions    Pain Relieving Factors some movement changes (c HEP)                [facility] PT Assessment - 03/01/21 0001       Assessment   Medical Diagnosis Lumbar radiculopathy to bilateral lower extremity    Referring Provider (PT) Jennifer Minion MD    Onset Date/Surgical Date --   approx. 6 months     AROM   Lumbar Extension 50 % WFL no complaints                           [facility] Adult PT Treatment/Exercise - 03/01/21 0001  Exercises   Exercises Other Exercises    Other Exercises  Review of existing HEP c cues      Lumbar Exercises: Stretches   Figure 4 Stretch 3 reps;30 seconds   bilateral, pushing away     Lumbar Exercises: Standing   Other Standing Lumbar Exercises lumbar extension 3 sec x 3      Lumbar Exercises: Supine   Other Supine Lumbar Exercises supine glute set 5 sec hold x 10                    PT Education - 03/01/21 1127     Education Details HEP progression    Person(s) Educated Patient    Methods Explanation;Demonstration;Verbal cues;Handout    Comprehension Verbalized understanding;Returned demonstration              PT Short Term Goals - 03/01/21 1116       PT SHORT TERM GOAL #1   Title Jennifer Davila will report reduced frequency and intensity of LE symptoms (0-3/10 VAS).    Time 4    Period Weeks    Status On-going    Target Date 03/08/21      PT SHORT TERM  GOAL #2   Title Jennifer Davila will be independent with her starter HEP.    Time 4    Period Weeks    Status On-going    Target Date 03/08/21               PT Long Term Goals - 03/01/21 1111       PT LONG TERM GOAL #1   Title Improve FOTO to 59.    Time 8    Period Weeks    Status On-going    Target Date 04/05/21      PT LONG TERM GOAL #2   Title Jennifer Davila will report B leg pain no > 2/10 on the Numeric Pain Rating Scale.    Baseline Can be 5+/10    Time 8    Period Weeks    Status On-going    Target Date 04/05/21      PT LONG TERM GOAL #3   Title Improve bilateral hip and trunk extension flexibility/AROM.    Baseline See objective.    Time 8    Period Weeks    Status On-going    Target Date 04/05/21      PT LONG TERM GOAL #4   Title Improve Bilat leg and low back strength as assessed by subjective self-report and FOTO scores.    Time 8    Period Weeks    Status On-going    Target Date 04/05/21      PT LONG TERM GOAL #5   Title Jennifer Davila will be independent with her long-term HEP at DC.    Time 8    Period Weeks    Status On-going    Target Date 04/05/21                   Plan - 03/01/21 1107     Clinical Impression Statement Review of existing HEP was positive c good knowledge at this time.  Continued process of attempting to progress lower extremity mobility and strength at this time.  Pt. may benefit from continued skilled PT services to incourage improved mobility and strength for daily and functional activity improvement.  Also discussed with patient prospects of possible assessment of vestibular complaints/orthostatic hypotension.    Personal Factors and Comorbidities Comorbidity 3+    Comorbidities  HTN, morbid obesity, arthritis    Examination-Activity Limitations Sit;Transfers;Bed Mobility;Bend;Lift;Squat;Stairs;Locomotion Level;Carry;Stand    Examination-Participation Restrictions Occupation;Cleaning;Community Activity    Stability/Clinical Decision  Making Stable/Uncomplicated    Rehab Potential Good    PT Frequency Other (comment)   1-2X/week   PT Duration 8 weeks    PT Treatment/Interventions ADLs/Self Care Home Management;Cryotherapy;Traction;Therapeutic activities;Gait training;Therapeutic exercise;Neuromuscular re-education;Patient/family education;Dry needling    PT Next Visit Plan Continue improvement of lumbar and leg mobility, possible vestibular assessment    PT Home Exercise Plan Access Code: [email or web address]  URL: [email or web address]  Date: 02/08/2021  Prepared by: Vista Mink    Exercises  Standing Lumbar Extension at [place] 5 x daily - 7 x weekly - 1 sets - 5 reps - 3 seconds hold  Supine Figure 4 Piriformis Stretch - 2 x daily - 7 x weekly - 1 sets - 5 reps - 20 seconds hold  Standing Scapular Retraction - 5 x daily - 7 x weekly - 1 sets - 5 reps - 5 second hold    Consulted and Agree with Plan of Care Patient             Patient will benefit from skilled therapeutic intervention in order to improve the following deficits and impairments:  Abnormal gait, Decreased activity tolerance, Decreased endurance, Decreased range of motion, Decreased strength, Difficulty walking, Impaired flexibility, Postural dysfunction, Improper body mechanics, Pain  Visit Diagnosis: Abnormal posture  Difficulty walking  Radiculopathy, lumbar region  Bilateral leg pain     Problem List Patient Active Problem List   Diagnosis Date Noted   Bilateral leg pain 06/03/2020   Chronic urticaria 05/26/2020   Seasonal and perennial allergic rhinitis 05/26/2020   Positive ANA (antinuclear antibody) 05/26/2020   Iron deficiency anemia 07/07/2018   Hematochezia 06/26/2018   Diverticulosis of colon with hemorrhage    Leg pain 01/06/2018   Bilateral lower extremity edema 01/06/2018   Lower GI bleed 01/06/2018   Hypokalemia 01/06/2018   Constipation 01/06/2018   Leg cramping 10/18/2017   Vitamin D deficiency, unspecified  10/18/2017   GERD (gastroesophageal reflux disease) 10/18/2017   Hypertension, essential, benign 09/04/2017   Hyperlipidemia 09/04/2017   Morbid obesity ([place]) 09/04/2017   Vertigo 09/04/2017   Hip pain 05/25/2015    Scot Jun, PT, DPT, OCS, ATC 03/01/21  11:34 AM    [facility] [address] [place], [place], [address] Phone: [phone]   Fax:  [phone]  Name: Jennifer [facility]ila MRN: [id number] Date of Birth: 07/23/1956

## 2021-03-08 ENCOUNTER — Encounter: Payer: Medicare HMO | Admitting: Physical Therapy

## 2021-03-09 ENCOUNTER — Ambulatory Visit: Payer: Medicare HMO | Admitting: Orthopedic Surgery

## 2021-03-09 DIAGNOSIS — I872 Venous insufficiency (chronic) (peripheral): Secondary | ICD-10-CM

## 2021-03-15 ENCOUNTER — Ambulatory Visit: Payer: Medicare HMO | Admitting: Rehabilitative and Restorative Service Providers"

## 2021-03-15 ENCOUNTER — Other Ambulatory Visit: Payer: Self-pay

## 2021-03-15 ENCOUNTER — Encounter: Payer: Self-pay | Admitting: Rehabilitative and Restorative Service Providers"

## 2021-03-15 DIAGNOSIS — M5416 Radiculopathy, lumbar region: Secondary | ICD-10-CM

## 2021-03-15 DIAGNOSIS — M79604 Pain in right leg: Secondary | ICD-10-CM

## 2021-03-15 DIAGNOSIS — R293 Abnormal posture: Secondary | ICD-10-CM | POA: Diagnosis not present

## 2021-03-15 DIAGNOSIS — R262 Difficulty in walking, not elsewhere classified: Secondary | ICD-10-CM | POA: Diagnosis not present

## 2021-03-15 DIAGNOSIS — M79605 Pain in left leg: Secondary | ICD-10-CM | POA: Diagnosis not present

## 2021-03-15 NOTE — Patient Instructions (Signed)
Access Code: [email or web address] URL: [email or web address] Date: 03/15/2021 Prepared by: Scot Jun  Exercises Standing Lumbar Extension at [place] 5 x daily - 7 x weekly - 1 sets - 5 reps - 3 seconds hold Supine Figure 4 Piriformis Stretch - 2 x daily - 7 x weekly - 1 sets - 5 reps - 30 seconds hold Standing Scapular Retraction - 5 x daily - 7 x weekly - 1 sets - 5 reps - 5 second hold Supine Hamstring Stretch with Strap - 2 x daily - 7 x weekly - 1 sets - 5 reps - 30 hold Supine Gluteal Sets - 2 x daily - 7 x weekly - 1 sets - 10 reps - 5 hold Supine Lower Trunk Rotation - 2 x daily - 7 x weekly - 5 reps - 1 sets - 15 hold Sit to Stand - 1 x daily - 7 x weekly - 3 sets - 10 reps

## 2021-03-15 NOTE — Therapy (Addendum)
[facility] [address] [place], [place], [address] Phone: [phone]   Fax:  [phone]  Physical Therapy Treatment/Discharge  Patient Details  Name: Jennifer Davila MRN: [id number] Date of Birth: 1955-08-26 Referring Provider (PT): Jennifer Minion MD   Encounter Date: 03/15/2021   PT End of Session - 03/15/21 1012     Visit Number 3    Number of Visits [place] 12 visits   Authorization Type [employer]    Authorization Time Period 02/08/2021- 04/07/2021    Progress Note Due on Visit 10    PT Start Time 1005    PT Stop Time 1044    PT Time Calculation (min) 39 min    Activity Tolerance Patient tolerated treatment well    Behavior During Therapy [facility] for tasks assessed/performed             Past Medical History:  Diagnosis Date   Arthritis    Constipation    on stool softener   GERD (gastroesophageal reflux disease)    Hyperlipidemia    Hypertension    Trouble swallowing 2013   following lap band   Urticaria     Past Surgical History:  Procedure Laterality Date   ABDOMINAL HYSTERECTOMY     BREAST BIOPSY Left 11/04/2019    BENIGN LYMPH NODE WITH PARACORTICAL HYPERPLASIA   CHOLECYSTECTOMY     COLONOSCOPY N/A 01/08/2018   Procedure: COLONOSCOPY;  Surgeon: Jennifer Flock, MD;  Location: [facility];  Service: Gastroenterology;  Laterality: N/A;   LAPAROSCOPIC GASTRIC BANDING     LAPAROSCOPIC REPAIR AND REMOVAL OF GASTRIC BAND  2013-2014    There were no vitals filed for this visit.   Subjective Assessment - 03/15/21 1024     Subjective Pt. indicated she tolerates pain and is clumbsy at times.    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    Patient  Stated Goals Decrease pain and work more comfortably    Currently in Pain? Yes    Pain Score 6    reported moderate   Pain Location Leg    Pain Orientation Left;Right    Pain Descriptors / Indicators Tightness    Pain Type Chronic pain    Pain Onset More than a month ago    Pain Frequency Intermittent    Aggravating Factors  no specific reported worse activity    Pain Relieving Factors "try to keep moving c exercise)                               [facility] Adult PT Treatment/Exercise - 03/15/21 0001       Exercises   Other Exercises  Review of HEP verbally for those not performed in clinic today      Lumbar Exercises: Standing   Row Both;15 reps   2-3 sec hold scap retraction   Theraband Level (Row) Level 3 (Green)    Other Standing Lumbar Exercises extension lumbar 10 x 3 sec hold      Lumbar Exercises: Seated   Sit to Stand 10 reps;Other (comment)   18 inch chair no UE  PT Education - 03/15/21 1040     Education Details Sit to stand added for home, aerobic exercise detailing    Person(s) Educated Patient    Methods Explanation;Demonstration;Verbal cues;Handout    Comprehension Returned demonstration;Verbalized understanding              PT Short Term Goals - 03/15/21 1042       PT SHORT TERM GOAL #1   Title Jennifer Davila will report reduced frequency and intensity of LE symptoms (0-3/10 VAS).    Time 4    Period Weeks    Status Not Met    Target Date 03/08/21      PT SHORT TERM GOAL #2   Title Jennifer Davila will be independent with her starter HEP.    Time 4    Period Weeks    Status Achieved    Target Date 03/08/21               PT Long Term Goals - 03/01/21 1111       PT LONG TERM GOAL #1   Title Improve FOTO to 59.    Time 8    Period Weeks    Status On-going    Target Date 04/05/21      PT LONG TERM GOAL #2   Title Jennifer Davila will report B leg pain no > 2/10 on the Numeric Pain Rating Scale.    Baseline Can  be 5+/10    Time 8    Period Weeks    Status On-going    Target Date 04/05/21      PT LONG TERM GOAL #3   Title Improve bilateral hip and trunk extension flexibility/AROM.    Baseline See objective.    Time 8    Period Weeks    Status On-going    Target Date 04/05/21      PT LONG TERM GOAL #4   Title Improve Bilat leg and low back strength as assessed by subjective self-report and FOTO scores.    Time 8    Period Weeks    Status On-going    Target Date 04/05/21      PT LONG TERM GOAL #5   Title Jennifer Davila will be independent with her long-term HEP at [place].    Time 8    Period Weeks    Status On-going    Target Date 04/05/21                   Plan - 03/15/21 1033     Clinical Impression Statement Discussed addition of focus on aerobic exercise (walking for example) as way to promote mobility and overall improvement in activity tolerance.  Recommended 3-4 x/week working up to 30 mins as able.    Continued strengthening applied in clinic and in HEP (addition of sit to stand activity).  Continued skilled PT services indicated at this time, c inclusion of neuro muscular control to address unsteady feelings reported in mobility per Pt.    Personal Factors and Comorbidities Comorbidity 3+    Comorbidities HTN, morbid obesity, arthritis    Examination-Activity Limitations Sit;Transfers;Bed Mobility;Bend;Lift;Squat;Stairs;Locomotion Level;Carry;Stand    Examination-Participation Restrictions Occupation;Cleaning;Community Activity    Stability/Clinical Decision Making Stable/Uncomplicated    Rehab Potential Good    PT Frequency Other (comment)   1-2X/week   PT Duration 8 weeks    PT Treatment/Interventions ADLs/Self Care Home Management;Cryotherapy;Traction;Therapeutic activities;Gait training;Therapeutic exercise;Neuromuscular re-education;Patient/family education;Dry needling    PT Next Visit Plan Continue improvement of lumbar and leg mobility, balance intervention,  possible  vestibular assessment prn    PT Home Exercise Plan Access Code: [email or web address]    Consulted and Agree with Plan of Care Patient             Patient will benefit from skilled therapeutic intervention in order to improve the following deficits and impairments:  Abnormal gait, Decreased activity tolerance, Decreased endurance, Decreased range of motion, Decreased strength, Difficulty walking, Impaired flexibility, Postural dysfunction, Improper body mechanics, Pain  Visit Diagnosis: Abnormal posture  Difficulty walking  Radiculopathy, lumbar region  Bilateral leg pain     Problem List Patient Active Problem List   Diagnosis Date Noted   Bilateral leg pain 06/03/2020   Chronic urticaria 05/26/2020   Seasonal and perennial allergic rhinitis 05/26/2020   Positive ANA (antinuclear antibody) 05/26/2020   Iron deficiency anemia 07/07/2018   Hematochezia 06/26/2018   Diverticulosis of colon with hemorrhage    Leg pain 01/06/2018   Bilateral lower extremity edema 01/06/2018   Lower GI bleed 01/06/2018   Hypokalemia 01/06/2018   Constipation 01/06/2018   Leg cramping 10/18/2017   Vitamin D deficiency, unspecified 10/18/2017   GERD (gastroesophageal reflux disease) 10/18/2017   Hypertension, essential, benign 09/04/2017   Hyperlipidemia 09/04/2017   Morbid obesity ([place]) 09/04/2017   Vertigo 09/04/2017   Hip pain 05/25/2015   Scot Jun, PT, DPT, OCS, ATC 03/15/21  10:47 AM  PHYSICAL THERAPY DISCHARGE SUMMARY  Visits from Start of Care: 3  Current functional level related to goals / functional outcomes: See note   Remaining deficits: See note   Education / Equipment: HEP   Patient agrees to discharge. Patient goals were partially met. Patient is being discharged due to not returning since the last visit.  Scot Jun, PT, DPT, OCS, ATC 05/03/21  2:49 PM     [facility] [address] [place], [place], [address] Phone:  [phone]   Fax:  [phone]  Name: Jennifer [facility]ila MRN: [id number] Date of Birth: [birth year]

## 2021-03-20 ENCOUNTER — Other Ambulatory Visit: Payer: Self-pay | Admitting: Orthopedic Surgery

## 2021-03-22 ENCOUNTER — Telehealth: Payer: Self-pay | Admitting: Rehabilitative and Restorative Service Providers"

## 2021-03-22 ENCOUNTER — Encounter: Payer: Self-pay | Admitting: Orthopedic Surgery

## 2021-03-22 ENCOUNTER — Encounter: Payer: Medicare HMO | Admitting: Rehabilitative and Restorative Service Providers"

## 2021-03-22 MED ORDER — ACETAMINOPHEN 500 MG PO TABS: 500.0000 mg | ORAL_TABLET | Freq: Every day | ORAL | 1 refills | Status: AC | PRN

## 2021-03-22 NOTE — Telephone Encounter (Signed)
Called Pt after no show for appointment.  She indicated she had to take husband to MD office and forgot about her appointment.  Reminded her of next appointment on Aug 31  Scot Jun, PT, DPT, OCS, ATC 03/22/21  10:49 AM

## 2021-03-22 NOTE — Progress Notes (Signed)
Office Visit Note   Patient: Jennifer Davila           Date of Birth: June 11, 1956           MRN: [id number] Visit Date: 03/09/2021              Requested by: [place], [name], [place],  [place] [id number] PCP: [place], [name], MD  Chief Complaint  Patient presents with   Left Leg - Follow-up   Right Leg - Follow-up      HPI: Patient is a 65 year old woman who presents in follow-up for both lower extremities patient states that she is concerned with her feet swelling.She complains of increased pain and swelling in both feet.  Patient states that she cannot take anti-inflammatories due to a previous GI bleed.  Patient was given a pair of compression socks on the last visit.  Assessment & Plan: Visit Diagnoses:  1. Venous insufficiency (chronic) (peripheral)     Plan: Recommended elevation exercise and wear the socks 24 hours a day.  She is also on a fluid pill.  Follow-Up Instructions: Return in about 4 weeks (around 04/06/2021).   Ortho Exam  Patient is alert, oriented, no adenopathy, well-dressed, normal affect, normal respiratory effort. Examination patient has a good dorsalis pedis pulse her calf measures 46 cm in circumference there is no redness no cellulitis no signs of infection there is no drainage.  Recommended a size extra-large compression sock at [facility] discount medical  Imaging: No results found. No images are attached to the encounter.  Labs: Lab Results  Component Value Date   HGBA1C 6.4 12/28/2020   HGBA1C 6.4 09/21/2020   HGBA1C 6.0 (H) 04/06/2020   ESRSEDRATE 33 (H) 06/07/2020   ESRSEDRATE 72 (H) 04/19/2020   CRP 31 (H) 04/19/2020     Lab Results  Component Value Date   ALBUMIN 4.6 04/19/2020   ALBUMIN 4.4 08/26/2019   ALBUMIN 4.1 06/26/2018    No results found for: MG Lab Results  Component Value Date   VD25OH 38 02/23/2020   VD25OH 26.17 (L) 12/22/2019   VD25OH <7.00 (L) 08/26/2019    No results found for:  PREALBUMIN CBC EXTENDED Latest Ref Rng & Units 09/21/2020 04/19/2020 04/06/2020  WBC 4.0 - 10.5 K/uL 6.9 5.1 5.5  RBC 3.87 - 5.11 Mil/uL 5.30(H) 5.39(H) 5.42(H)  HGB 12.0 - 15.0 g/dL 12.2 12.6 12.9  HCT 36.0 - 46.0 % 39.0 40.5 41.0  PLT 150.0 - 400.0 K/uL 311.0 - 296  NEUTROABS 1.4 - 7.0 x10E3/uL - 3.3 -  LYMPHSABS 0.7 - 3.1 x10E3/uL - 1.2 -     There is no height or weight on file to calculate BMI.  Orders:  No orders of the defined types were placed in this encounter.  No orders of the defined types were placed in this encounter.    Procedures: No procedures performed  Clinical Data: No additional findings.  ROS:  All other systems negative, except as noted in the HPI. Review of Systems  Objective: Vital Signs: There were no vitals taken for this visit.  Specialty Comments:  No specialty comments available.  PMFS History: Patient Active Problem List   Diagnosis Date Noted   Bilateral leg pain 06/03/2020   Chronic urticaria 05/26/2020   Seasonal and perennial allergic rhinitis 05/26/2020   Positive ANA (antinuclear antibody) 05/26/2020   Iron deficiency anemia 07/07/2018   Hematochezia 06/26/2018   Diverticulosis of colon with hemorrhage    Leg pain  01/06/2018   Bilateral lower extremity edema 01/06/2018   Lower GI bleed 01/06/2018   Hypokalemia 01/06/2018   Constipation 01/06/2018   Leg cramping 10/18/2017   Vitamin D deficiency, unspecified 10/18/2017   GERD (gastroesophageal reflux disease) 10/18/2017   Hypertension, essential, benign 09/04/2017   Hyperlipidemia 09/04/2017   Morbid obesity ([place]) 09/04/2017   Vertigo 09/04/2017   Hip pain 05/25/2015   Past Medical History:  Diagnosis Date   Arthritis    Constipation    on stool softener   GERD (gastroesophageal reflux disease)    Hyperlipidemia    Hypertension    Trouble swallowing 2013   following lap band   Urticaria     Family History  Problem Relation Age of Onset   Hypertension Mother     Stroke Mother    Diabetes Father    Alcohol abuse Father    Kidney disease Father    Colon polyps Brother    Cancer Brother    Alcohol abuse Brother     Past Surgical History:  Procedure Laterality Date   ABDOMINAL HYSTERECTOMY     BREAST BIOPSY Left 11/04/2019    BENIGN LYMPH NODE WITH PARACORTICAL HYPERPLASIA   CHOLECYSTECTOMY     COLONOSCOPY N/A 01/08/2018   Procedure: COLONOSCOPY;  Surgeon: Yetta Flock, MD;  Location: [facility];  Service: Gastroenterology;  Laterality: N/A;   LAPAROSCOPIC GASTRIC BANDING     LAPAROSCOPIC REPAIR AND REMOVAL OF GASTRIC BAND  2013-2014   Social History   Occupational History   Not on file  Tobacco Use   Smoking status: Former    Types: Cigarettes    Quit date: 10/02/1972    Years since quitting: 48.5   Smokeless tobacco: Never  Vaping Use   Vaping Use: Never used  Substance and Sexual Activity   Alcohol use: No   Drug use: No   Sexual activity: Never

## 2021-03-22 NOTE — Progress Notes (Signed)
Office Visit Note   Patient: Jennifer Davila           Date of Birth: [birth year]           MRN: [id number] Visit Date: 02/23/2021              Requested by: Martinique, [name], [place],  [place] [address] PCP: Martinique, [name], MD  Chief Complaint  Patient presents with   Left Leg - Follow-up   Right Leg - Follow-up      HPI: Patient is a 65 year old woman who presents with swelling and pain in both legs.  Patient has been working with physical therapy.She complains of increased pain and swelling in both feet.  On her last exam patient complained of radicular pain from both lower extremities.  Patient feels like the therapy has helped her radicular symptoms.  Assessment & Plan: Visit Diagnoses:  1. Venous insufficiency (chronic) (peripheral)   2. Pain in both lower extremities     Plan: Recommended compression elevation, and continue with therapy.  Follow-Up Instructions: Return in about 2 weeks (around 03/09/2021).   Ortho Exam  Patient is alert, oriented, no adenopathy, well-dressed, normal affect, normal respiratory effort. Examination there is increased swelling of both feet and legs.  Patient has pain to palpation of the swelling there is no cellulitis no dermatitis no open ulcers or drainage.  Her calf measures 46 cm in circumference she has a negative straight leg raise bilaterally no focal motor weakness there is no radicular symptoms at this time.  Imaging: No results found. No images are attached to the encounter.  Labs: Lab Results  Component Value Date   HGBA1C 6.4 12/28/2020   HGBA1C 6.4 09/21/2020   HGBA1C 6.0 (H) 04/06/2020   ESRSEDRATE 33 (H) 06/07/2020   ESRSEDRATE 72 (H) 04/19/2020   CRP 31 (H) 04/19/2020     Lab Results  Component Value Date   ALBUMIN 4.6 04/19/2020   ALBUMIN 4.4 08/26/2019   ALBUMIN 4.1 06/26/2018    No results found for: MG Lab Results  Component Value Date   VD25OH 38 02/23/2020   VD25OH 26.17  (L) 12/22/2019   VD25OH <7.00 (L) 08/26/2019    No results found for: PREALBUMIN CBC EXTENDED Latest Ref Rng & Units 09/21/2020 04/19/2020 04/06/2020  WBC 4.0 - 10.5 K/uL 6.9 5.1 5.5  RBC 3.87 - 5.11 Mil/uL 5.30(H) 5.39(H) 5.42(H)  HGB 12.0 - 15.0 g/dL 12.2 12.6 12.9  HCT 36.0 - 46.0 % 39.0 40.5 41.0  PLT 150.0 - 400.0 K/uL 311.0 - 296  NEUTROABS 1.4 - 7.0 x10E3/uL - 3.3 -  LYMPHSABS 0.7 - 3.1 x10E3/uL - 1.2 -     There is no height or weight on file to calculate BMI.  Orders:  No orders of the defined types were placed in this encounter.  No orders of the defined types were placed in this encounter.    Procedures: No procedures performed  Clinical Data: No additional findings.  ROS:  All other systems negative, except as noted in the HPI. Review of Systems  Objective: Vital Signs: There were no vitals taken for this visit.  Specialty Comments:  No specialty comments available.  PMFS History: Patient Active Problem List   Diagnosis Date Noted   Bilateral leg pain 06/03/2020   Chronic urticaria 05/26/2020   Seasonal and perennial allergic rhinitis 05/26/2020   Positive ANA (antinuclear antibody) 05/26/2020   Iron deficiency anemia 07/07/2018   Hematochezia 06/26/2018  Diverticulosis of colon with hemorrhage    Leg pain 01/06/2018   Bilateral lower extremity edema 01/06/2018   Lower GI bleed 01/06/2018   Hypokalemia 01/06/2018   Constipation 01/06/2018   Leg cramping 10/18/2017   Vitamin D deficiency, unspecified 10/18/2017   GERD (gastroesophageal reflux disease) 10/18/2017   Hypertension, essential, benign 09/04/2017   Hyperlipidemia 09/04/2017   Morbid obesity ([place]) 09/04/2017   Vertigo 09/04/2017   Hip pain 05/25/2015   Past Medical History:  Diagnosis Date   Arthritis    Constipation    on stool softener   GERD (gastroesophageal reflux disease)    Hyperlipidemia    Hypertension    Trouble swallowing 2013   following lap band   Urticaria      Family History  Problem Relation Age of Onset   Hypertension Mother    Stroke Mother    Diabetes Father    Alcohol abuse Father    Kidney disease Father    Colon polyps Brother    Cancer Brother    Alcohol abuse Brother     Past Surgical History:  Procedure Laterality Date   ABDOMINAL HYSTERECTOMY     BREAST BIOPSY Left 11/04/2019    BENIGN LYMPH NODE WITH PARACORTICAL HYPERPLASIA   CHOLECYSTECTOMY     COLONOSCOPY N/A 01/08/2018   Procedure: COLONOSCOPY;  Surgeon: Yetta Flock, MD;  Location: [facility];  Service: Gastroenterology;  Laterality: N/A;   LAPAROSCOPIC GASTRIC BANDING     LAPAROSCOPIC REPAIR AND REMOVAL OF GASTRIC BAND  2013-2014   Social History   Occupational History   Not on file  Tobacco Use   Smoking status: Former    Types: Cigarettes    Quit date: 10/02/1972    Years since quitting: 48.5   Smokeless tobacco: Never  Vaping Use   Vaping Use: Never used  Substance and Sexual Activity   Alcohol use: No   Drug use: No   Sexual activity: Never

## 2021-03-29 ENCOUNTER — Encounter: Payer: Medicare HMO | Admitting: Rehabilitative and Restorative Service Providers"

## 2021-03-29 ENCOUNTER — Telehealth: Payer: Self-pay | Admitting: Rehabilitative and Restorative Service Providers"

## 2021-03-29 NOTE — Telephone Encounter (Signed)
Called after no show (2nd in a row).  Unable to leave message.  Will cancel visits except for next one due to attendance policy with no shows. Scot Jun, PT, DPT, OCS, ATC 03/29/21  10:33 AM

## 2021-03-30 ENCOUNTER — Encounter: Payer: Self-pay | Admitting: Orthopedic Surgery

## 2021-03-30 ENCOUNTER — Ambulatory Visit: Payer: Medicare HMO | Admitting: Physician Assistant

## 2021-03-30 DIAGNOSIS — I10 Essential (primary) hypertension: Secondary | ICD-10-CM | POA: Diagnosis not present

## 2021-03-30 NOTE — Progress Notes (Signed)
Office Visit Note   Patient: Jennifer Davila           Date of Birth: [birth year]           MRN: [id number] Visit Date: 03/30/2021              Requested by: Martinique, [name], [place],  [place] [address] PCP: Martinique, [name], MD  Chief Complaint  Patient presents with   Right Leg - Follow-up   Left Leg - Follow-up      HPI: Patient presents in today for her bilateral lower extremity leg and foot pain secondary to chronic venous insufficiency.  At her last visit she was provided with a pair of compression socks.  His she does wear these but admits because of the summer that sometimes she is takes a break from them.  She is also trying to walk around her house more.  She is complaining of cramping in her legs especially at night  Assessment & Plan: Visit Diagnoses: No diagnosis found.  Plan: We discussed the importance of alternating some light walking with elevating her legs and with intermittent light walking.  We discussed wearing the socks as much as she possibly can.  She is also going to try coconut water for some of the cramping  Follow-Up Instructions: No follow-ups on file.   Ortho Exam  Patient is alert, oriented, no adenopathy, well-dressed, normal affect, normal respiratory effort. Bilateral lower extremities no open ulcers she does have some swelling compartments are soft and compressible negative Homans' sign.  No cellulitis or signs of infection skin is actually in fairly good condition  Imaging: No results found. No images are attached to the encounter.  Labs: Lab Results  Component Value Date   HGBA1C 6.4 12/28/2020   HGBA1C 6.4 09/21/2020   HGBA1C 6.0 (H) 04/06/2020   ESRSEDRATE 33 (H) 06/07/2020   ESRSEDRATE 72 (H) 04/19/2020   CRP 31 (H) 04/19/2020     Lab Results  Component Value Date   ALBUMIN 4.6 04/19/2020   ALBUMIN 4.4 08/26/2019   ALBUMIN 4.1 06/26/2018    No results found for: MG Lab Results  Component Value  Date   VD25OH 38 02/23/2020   VD25OH 26.17 (L) 12/22/2019   VD25OH <7.00 (L) 08/26/2019    No results found for: PREALBUMIN CBC EXTENDED Latest Ref Rng & Units 09/21/2020 04/19/2020 04/06/2020  WBC 4.0 - 10.5 K/uL 6.9 5.1 5.5  RBC 3.87 - 5.11 Mil/uL 5.30(H) 5.39(H) 5.42(H)  HGB 12.0 - 15.0 g/dL 12.2 12.6 12.9  HCT 36.0 - 46.0 % 39.0 40.5 41.0  PLT 150.0 - 400.0 K/uL 311.0 - 296  NEUTROABS 1.4 - 7.0 x10E3/uL - 3.3 -  LYMPHSABS 0.7 - 3.1 x10E3/uL - 1.2 -     There is no height or weight on file to calculate BMI.  Orders:  No orders of the defined types were placed in this encounter.  No orders of the defined types were placed in this encounter.    Procedures: No procedures performed  Clinical Data: No additional findings.  ROS:  All other systems negative, except as noted in the HPI. Review of Systems  Objective: Vital Signs: There were no vitals taken for this visit.  Specialty Comments:  No specialty comments available.  PMFS History: Patient Active Problem List   Diagnosis Date Noted   Bilateral leg pain 06/03/2020   Chronic urticaria 05/26/2020   Seasonal and perennial allergic rhinitis 05/26/2020   Positive  ANA (antinuclear antibody) 05/26/2020   Iron deficiency anemia 07/07/2018   Hematochezia 06/26/2018   Diverticulosis of colon with hemorrhage    Leg pain 01/06/2018   Bilateral lower extremity edema 01/06/2018   Lower GI bleed 01/06/2018   Hypokalemia 01/06/2018   Constipation 01/06/2018   Leg cramping 10/18/2017   Vitamin D deficiency, unspecified 10/18/2017   GERD (gastroesophageal reflux disease) 10/18/2017   Hypertension, essential, benign 09/04/2017   Hyperlipidemia 09/04/2017   Morbid obesity ([place]) 09/04/2017   Vertigo 09/04/2017   Hip pain 05/25/2015   Past Medical History:  Diagnosis Date   Arthritis    Constipation    on stool softener   GERD (gastroesophageal reflux disease)    Hyperlipidemia    Hypertension    Trouble swallowing  2013   following lap band   Urticaria     Family History  Problem Relation Age of Onset   Hypertension Mother    Stroke Mother    Diabetes Father    Alcohol abuse Father    Kidney disease Father    Colon polyps Brother    Cancer Brother    Alcohol abuse Brother     Past Surgical History:  Procedure Laterality Date   ABDOMINAL HYSTERECTOMY     BREAST BIOPSY Left 11/04/2019    BENIGN LYMPH NODE WITH PARACORTICAL HYPERPLASIA   CHOLECYSTECTOMY     COLONOSCOPY N/A 01/08/2018   Procedure: COLONOSCOPY;  Surgeon: Yetta Flock, MD;  Location: [facility];  Service: Gastroenterology;  Laterality: N/A;   LAPAROSCOPIC GASTRIC BANDING     LAPAROSCOPIC REPAIR AND REMOVAL OF GASTRIC BAND  2013-2014   Social History   Occupational History   Not on file  Tobacco Use   Smoking status: Former    Types: Cigarettes    Quit date: 10/02/1972    Years since quitting: 48.5   Smokeless tobacco: Never  Vaping Use   Vaping Use: Never used  Substance and Sexual Activity   Alcohol use: No   Drug use: No   Sexual activity: Never

## 2021-04-05 ENCOUNTER — Encounter: Payer: Medicare HMO | Admitting: Rehabilitative and Restorative Service Providers"

## 2021-04-05 ENCOUNTER — Telehealth: Payer: Self-pay | Admitting: Pharmacist

## 2021-04-05 NOTE — Chronic Care Management (AMB) (Signed)
Chronic Care Management Pharmacy Assistant   Name: Jennifer Davila  MRN: [id number] DOB: [birth year]   Reason for Encounter: Disease State/ General Assessment Call   Conditions to be addressed/monitored: HTN and HLD   Recent office visits:  None  Recent consult visits:  03/30/2021 Bevely Palmer Persons PA (Orthopedic Surgery) - Seen for hypertension. No medication changes. Follow up in 2 months.  03/15/2021 Scot Jun PT (Physical Therapy) - Seen for physical therapy session for abnormal posture. No medication changes. No follow up noted.  03/09/2021 Meridee Score MD (Orthopedic Surgery) - Seen for Venous insufficiency. No medication changes. Follow up in 4 weeks.   02/23/2021 Meridee Score MD (Orthopedic Surgery) - Seen for Venous insufficiency. No medication changes. Follow up in 2 weeks.  Hospital visits:  None in previous 6 months  Medications: Outpatient Encounter Medications as of 04/05/2021  Medication Sig   acetaminophen (TYLENOL) 500 MG tablet Take 1-2 tablets (500-1,000 mg total) by mouth daily as needed for moderate pain (pain).   amLODipine (NORVASC) 5 MG tablet TAKE 1 TABLET BY MOUTH EVERYDAY AT BEDTIME   esomeprazole (NEXIUM) 40 MG capsule TAKE 1 CAPSULE BY MOUTH EVERY DAY   HYDROcodone-acetaminophen (NORCO/VICODIN) 5-325 MG tablet Take 1 tablet by mouth daily as needed for moderate pain. (Patient not taking: Reported on 02/02/2021)   ibuprofen (ADVIL) 400 MG tablet Take 1 tablet (400 mg total) by mouth daily as needed.   losartan (COZAAR) 100 MG tablet Take 1 tablet (100 mg total) by mouth daily.   potassium chloride (KLOR-CON) 10 MEQ tablet TAKE 1 TABLET BY MOUTH EVERY DAY   pravastatin (PRAVACHOL) 20 MG tablet Take 1 tablet (20 mg total) by mouth daily.   spironolactone (ALDACTONE) 25 MG tablet TAKE 1 TABLET (25 MG TOTAL) BY MOUTH DAILY.   tiZANidine (ZANAFLEX) 4 MG tablet TAKE 1 TABLET BY MOUTH 2 TIMES DAILY AS NEEDED. (Patient not taking: Reported on 02/02/2021)    No facility-administered encounter medications on file as of 04/05/2021.   Fill History:  esomeprazole (NEXIUM) capsule 12/07/2020 90   HYDROcodone-acetaminophen (NORCO) tablet 5-325 mg 12/28/2020 15   ibuprofen (MOTRIN) tablet 10/07/2020 30   losartan (COZAAR) tablet 12/28/2020 90   potassium chloride (K-DUR,KLOR-CON) CR tablet 12/06/2020 90   pravastatin (PRAVACHOL) tablet 12/23/2020 90   spironolactone (ALDACTONE) tablet 02/06/2021 90   amLODIpine (NORVASC) tablet 12/19/2020 90   tiZANidine (ZANAFLEX) tablet 10/07/2020 15   Reviewed chart prior to disease state call. Spoke with patient regarding BP  Recent Office Vitals: BP Readings from Last 3 Encounters:  12/28/20 136/80  09/21/20 128/70  06/15/20 (!) 165/90   Pulse Readings from Last 3 Encounters:  12/28/20 84  09/21/20 86  06/15/20 78    Wt Readings from Last 3 Encounters:  12/28/20 251 lb 4 oz (114 kg)  09/21/20 251 lb (113.9 kg)  06/15/20 246 lb 12.8 oz (111.9 kg)     Kidney Function Lab Results  Component Value Date/Time   CREATININE 0.99 10/07/2020 09:37 AM   CREATININE 0.97 09/21/2020 12:24 PM   CREATININE 1.03 (H) 05/03/2016 11:52 AM   CREATININE 0.93 10/31/2015 10:30 AM   GFR 59.23 (L) 10/07/2020 09:37 AM   GFRNONAA 59 (L) 04/19/2020 10:51 AM   GFRNONAA 58 (L) 05/03/2016 11:52 AM   GFRAA 68 04/19/2020 10:51 AM   GFRAA 67 05/03/2016 11:52 AM    BMP Latest Ref Rng & Units 12/28/2020 10/07/2020 09/21/2020  Glucose 70 - 99 mg/dL - 93 98  BUN 6 - 23  mg/dL - 11 12  Creatinine 0.40 - 1.20 mg/dL - 0.99 0.97  BUN/Creat Ratio 12 - 28 - - -  Sodium 135 - 145 mEq/L - 142 139  Potassium 3.5 - 5.1 mEq/L 4.3 4.0 3.3(L)  Chloride 96 - 112 mEq/L - 104 98  CO2 19 - 32 mEq/L - 30 33(H)  Calcium 8.4 - 10.5 mg/dL - 9.3 9.4    Current antihypertensive regimen:  Losartan 100 mg 1 tablet daily Amlodipine 5 mg 1 tablet daily at bedtime Spironolactone 25 mg 1 tablet daily How often are you checking your Blood  Pressure?  Current home BP readings:  What recent interventions/DTPs have been made by any provider to improve Blood Pressure control since last CPP Visit: None Any recent hospitalizations or ED visits since last visit with CPP? No What diet changes have been made to improve Blood Pressure Control?   What exercise is being done to improve your Blood Pressure Control?    Adherence Review: Is the patient currently on ACE/ARB medication? Yes Does the patient have >5 day gap between last estimated fill dates? Yes   Comprehensive medication review performed; Spoke to patient regarding cholesterol  Lipid Panel    Component Value Date/Time   CHOL 185 09/21/2020 1224   TRIG 167.0 (H) 09/21/2020 1224   HDL 41.90 09/21/2020 1224   LDLCALC 110 (H) 09/21/2020 1224    10-year ASCVD risk score: The 10-year ASCVD risk score Mikey Bussing DC Jr., et al., 2013) is: 11.1%   Values used to calculate the score:     Age: 65 years     Sex: Female     Is Non-Hispanic African American: Yes     Diabetic: No     Tobacco smoker: No     Systolic Blood Pressure: [id number] mmHg     Is BP treated: Yes     HDL Cholesterol: 41.9 mg/dL     Total Cholesterol: 185 mg/dL  Current antihyperlipidemic regimen:  Pravastatin 20 mg 1 tablet daily Previous antihyperlipidemic medications tried: None ASCVD risk enhancing conditions: age >73, HTN, and current smoker What recent interventions/DTPs have been made by any provider to improve Cholesterol control since last CPP Visit: None. Any recent hospitalizations or ED visits since last visit with CPP? No What diet changes have been made to improve Cholesterol?   What exercise is being done to improve Cholesterol?    Adherence Review: Does the patient have >5 day gap between last estimated fill dates? Yes   Notes: Spoke with patient who stated she needed to cancel her upcoming 05/04/21 appointment with [name] the clinical pharmacist due to her husband having lung  surgery this month and having to be his only care taker. Patient did not have time today to take or this month per patient. Patient and her husband were on the way out the door when I called for an appointment for her husband. Will move patients assessment to November per patient.   Care Gaps:  AWV - Completed 12/19/2020 Zoster vaccine - never done Covid 19 booster 3 - overdue 07/17/2020 Flu vaccine - due  Star Rating Drugs:  Losartan '100mg'$  - last filled 12/28/2020 90DS  at [facility] Pravastatin '20mg'$  - last filled 12/23/2020 90DS at [facility] Pharmacist Assistant [phone]

## 2021-04-12 ENCOUNTER — Encounter: Payer: Medicare HMO | Admitting: Rehabilitative and Restorative Service Providers"

## 2021-05-04 ENCOUNTER — Telehealth: Payer: Medicare HMO

## 2021-05-29 ENCOUNTER — Ambulatory Visit: Payer: Medicare HMO | Admitting: Orthopedic Surgery

## 2021-05-30 ENCOUNTER — Ambulatory Visit (INDEPENDENT_AMBULATORY_CARE_PROVIDER_SITE_OTHER): Payer: Medicare HMO | Admitting: Family Medicine

## 2021-05-30 ENCOUNTER — Encounter: Payer: Self-pay | Admitting: Family Medicine

## 2021-05-30 ENCOUNTER — Other Ambulatory Visit: Payer: Self-pay

## 2021-05-30 VITALS — BP 128/80 | HR 93 | Resp 16 | Ht 61.0 in | Wt 253.0 lb

## 2021-05-30 DIAGNOSIS — R7303 Prediabetes: Secondary | ICD-10-CM | POA: Diagnosis not present

## 2021-05-30 DIAGNOSIS — L989 Disorder of the skin and subcutaneous tissue, unspecified: Secondary | ICD-10-CM

## 2021-05-30 DIAGNOSIS — M25571 Pain in right ankle and joints of right foot: Secondary | ICD-10-CM

## 2021-05-30 DIAGNOSIS — E876 Hypokalemia: Secondary | ICD-10-CM | POA: Diagnosis not present

## 2021-05-30 DIAGNOSIS — Z23 Encounter for immunization: Secondary | ICD-10-CM | POA: Diagnosis not present

## 2021-05-30 DIAGNOSIS — M79604 Pain in right leg: Secondary | ICD-10-CM | POA: Diagnosis not present

## 2021-05-30 DIAGNOSIS — M79605 Pain in left leg: Secondary | ICD-10-CM

## 2021-05-30 DIAGNOSIS — I10 Essential (primary) hypertension: Secondary | ICD-10-CM

## 2021-05-30 LAB — CBC
HCT: 40 % (ref 36.0–46.0)
Hemoglobin: 12.5 g/dL (ref 12.0–15.0)
MCHC: 31.1 g/dL (ref 30.0–36.0)
MCV: 73.8 fl — ABNORMAL LOW (ref 78.0–100.0)
Platelets: 297 10*3/uL (ref 150.0–400.0)
RBC: 5.42 Mil/uL — ABNORMAL HIGH (ref 3.87–5.11)
RDW: 14.1 % (ref 11.5–15.5)
WBC: 6.7 10*3/uL (ref 4.0–10.5)

## 2021-05-30 LAB — HEMOGLOBIN A1C: Hgb A1c MFr Bld: 6.2 % (ref 4.6–6.5)

## 2021-05-30 LAB — BASIC METABOLIC PANEL
BUN: 13 mg/dL (ref 6–23)
CO2: 31 mEq/L (ref 19–32)
Calcium: 9.2 mg/dL (ref 8.4–10.5)
Chloride: 102 mEq/L (ref 96–112)
Creatinine, Ser: 0.93 mg/dL (ref 0.40–1.20)
GFR: 63.56 mL/min (ref >60.00)
Glucose, Bld: 103 mg/dL — ABNORMAL HIGH (ref 70–99)
Potassium: 3.7 mEq/L (ref 3.5–5.1)
Sodium: 140 mEq/L (ref 135–145)

## 2021-05-30 NOTE — Progress Notes (Signed)
HPI: Jennifer Davila is a 65 y.o. female, who is here today for chronic disease management.  Last seen on 12/28/2020.  She is under some stress, her husband is having lung lesion biopsy tomorrow.  He is having worsening dyspnea, last night she was up 8 times to help him with neb treatments.  For the past few weeks she has not had much sleep.  Hypertension:  Medications: Spironolactone 25 mg,Losartan 100 mg daily, and Amlodipine 5 mg daily. BP readings at home: She is not checking. Side effects: None  Negative for unusual or severe headache, visual changes, exertional chest pain, dyspnea,  focal weakness, or worsening edema. HypoK+: On KLOR 10 meq daily.  Lab Results  Component Value Date   CREATININE 0.99 10/07/2020   BUN 11 10/07/2020   NA 142 10/07/2020   K 4.3 12/28/2020   CL 104 10/07/2020   CO2 30 10/07/2020   Prediabetes: Negative for abdominal pain, nausea,vomiting, polydipsia,polyuria, or polyphagia. She has not been consistent with following a healthful diet.  She has not been exercising regularly.  Lab Results  Component Value Date   HGBA1C 6.4 12/28/2020   Today she is concerned about lesion on left side of forehead for a while, it gets tender when she scratches it. Slowly getting bigger. Negative for pruritus.  Right ankle pain with certain movement, started a month ago. No worsening edema, wearing compression stockings. Lateral ankle pain,5/10. No history of trauma.  She has a skin tag right upper thigh for a while. She wonders if it can be removed.  Since her last visit she has seen Dr Sharol Given for LE's pain. She is on Ibuprofen 400 mg bid prn, she usually takes medication when she is attending church service during weekends. Pain is stable. Sometimes she feels lower extremity weakness, usually when she gets up after prolonged sitting or early in the morning.  It does improve after walking for a few minutes.  She has not noted melena,blood in  stool,or increase bruising.  Lab Results  Component Value Date   WBC 6.9 09/21/2020   HGB 12.2 09/21/2020   HCT 39.0 09/21/2020   MCV 73.5 (L) 09/21/2020   PLT 311.0 09/21/2020   Occasional bilateral ear discomfort, problem has been intermittent for a while. She has not identified exacerbating or alleviating factors.  Negative for earache or changes in hearing.  Review of Systems  Constitutional:  Positive for fatigue. Negative for activity change, appetite change and fever.  HENT:  Negative for ear discharge, mouth sores, nosebleeds and trouble swallowing.   Respiratory:  Negative for cough and wheezing.   Gastrointestinal:        Negative for changes in bowel habits.  Genitourinary:  Negative for decreased urine volume and dysuria.  Musculoskeletal:  Positive for myalgias. Negative for gait problem.  Skin:  Negative for rash and wound.  Neurological:  Negative for syncope, facial asymmetry and weakness.  Psychiatric/Behavioral:  Negative for confusion. The patient is nervous/anxious.   Rest of ROS see pertinent positives and negatives in HPI.  Current Outpatient Medications on File Prior to Visit  Medication Sig Dispense Refill   acetaminophen (TYLENOL) 500 MG tablet Take 1-2 tablets (500-1,000 mg total) by mouth daily as needed for moderate pain (pain). 60 tablet 1   amLODipine (NORVASC) 5 MG tablet TAKE 1 TABLET BY MOUTH EVERYDAY AT BEDTIME 90 tablet 1   esomeprazole (NEXIUM) 40 MG capsule TAKE 1 CAPSULE BY MOUTH EVERY DAY 90 capsule 3   ibuprofen (  ADVIL) 400 MG tablet Take 1 tablet (400 mg total) by mouth daily as needed. 30 tablet 1   losartan (COZAAR) 100 MG tablet Take 1 tablet (100 mg total) by mouth daily. 90 tablet 2   potassium chloride (KLOR-CON) 10 MEQ tablet TAKE 1 TABLET BY MOUTH EVERY DAY 90 tablet 2   pravastatin (PRAVACHOL) 20 MG tablet Take 1 tablet (20 mg total) by mouth daily. 90 tablet 3   spironolactone (ALDACTONE) 25 MG tablet TAKE 1 TABLET (25 MG TOTAL)  BY MOUTH DAILY. 90 tablet 1   No current facility-administered medications on file prior to visit.   Past Medical History:  Diagnosis Date   Arthritis    Constipation    on stool softener   GERD (gastroesophageal reflux disease)    Hyperlipidemia    Hypertension    Trouble swallowing 2013   following lap band   Urticaria    No Known Allergies  Social History   Socioeconomic History   Marital status: Married    Spouse name: Not on file   Number of children: 1   Years of education: Not on file   Highest education level: Not on file  Occupational History   Not on file  Tobacco Use   Smoking status: Former    Types: Cigarettes    Quit date: 10/02/1972    Years since quitting: 48.6   Smokeless tobacco: Never  Vaping Use   Vaping Use: Never used  Substance and Sexual Activity   Alcohol use: No   Drug use: No   Sexual activity: Never  Other Topics Concern   Not on file  Social History Narrative   Not on file   Social Determinants of Health   Financial Resource Strain: Medium Risk   Difficulty of Paying Living Expenses: Somewhat hard  Food Insecurity: Not on file  Transportation Needs: No Transportation Needs   Lack of Transportation (Medical): No   Lack of Transportation (Non-Medical): No  Physical Activity: Insufficiently Active   Days of Exercise per Week: 2 days   Minutes of Exercise per Session: 30 min  Stress: Not on file  Social Connections: Socially Integrated   Frequency of Communication with Friends and Family: Twice a week   Frequency of Social Gatherings with Friends and Family: Twice a week   Attends Religious Services: More than 4 times per year   Active Member of [employer] or Organizations: Yes   Attends Archivist Meetings: 1 to 4 times per year   Marital Status: Married   Vitals:   05/30/21 1045  BP: 128/80  Pulse: 93  Resp: 16  SpO2: 99%   Body mass index is 47.8 kg/m.  Physical Exam Vitals and nursing note reviewed.   Constitutional:      General: She is not in acute distress.    Appearance: She is well-developed.  HENT:     Head: Normocephalic and atraumatic.      Right Ear: Tympanic membrane, ear canal and external ear normal.     Left Ear: Tympanic membrane, ear canal and external ear normal.     Mouth/Throat:     Mouth: Mucous membranes are moist.     Pharynx: Oropharynx is clear.  Eyes:     Conjunctiva/sclera: Conjunctivae normal.  Cardiovascular:     Rate and Rhythm: Normal rate and regular rhythm.     Pulses:          Dorsalis pedis pulses are 2+ on the right side and 2+ on the  left side.     Heart sounds: No murmur heard.    Comments: Trace pitting and 1+ peri ankle edema, bilateral. Pulmonary:     Effort: Pulmonary effort is normal. No respiratory distress.     Breath sounds: Normal breath sounds.  Abdominal:     Palpations: Abdomen is soft. There is no hepatomegaly or mass.     Tenderness: There is no abdominal tenderness.  Musculoskeletal:     Right ankle: No deformity or ecchymosis. Tenderness present over the lateral malleolus and ATF ligament. No medial malleolus tenderness. Normal range of motion. Anterior drawer test negative. Normal pulse.       Feet:  Lymphadenopathy:     Cervical: No cervical adenopathy.  Skin:    General: Skin is warm.     Findings: No erythema or rash.  Neurological:     General: No focal deficit present.     Mental Status: She is alert and oriented to person, place, and time.     Cranial Nerves: No cranial nerve deficit.     Gait: Gait normal.  Psychiatric:        Mood and Affect: Mood is anxious. Affect is labile.     Comments: Well groomed, good eye contact.   ASSESSMENT AND PLAN:  Jennifer Davila was seen today for follow-up.  Diagnoses and all orders for this visit: Orders Placed This Encounter  Procedures   Flu Vaccine QUAD High Dose(Fluad)   CBC   Basic metabolic panel   Hemoglobin A1c   Ambulatory referral to Dermatology   Lab  Results  Component Value Date   WBC 6.7 05/30/2021   HGB 12.5 05/30/2021   HCT 40.0 05/30/2021   MCV 73.8 (L) 05/30/2021   PLT 297.0 05/30/2021   Lab Results  Component Value Date   CREATININE 0.93 05/30/2021   BUN 13 05/30/2021   NA 140 05/30/2021   K 3.7 05/30/2021   CL 102 05/30/2021   CO2 31 05/30/2021   Lab Results  Component Value Date   HGBA1C 6.2 05/30/2021   Acute right ankle pain ?  Ankle sprain. I do not think imaging is needed at this time. Recommend ABC range of motion exercises. Topical IcyHot or Aspercreme may also help.  Need for influenza vaccination -     Flu Vaccine QUAD High Dose(Fluad)  Facial skin lesion ?  Sebaceous yeast. Appointment with dermatologist will be arranged to address this problem as well as a skin tag concerns.  Hypokalemia Problem has been stable. Continue KLOR 10 meq daily.  Hypertension, essential, benign BP adequately controlled. Continue current management: Spironolactone 25 mg,Losartan 100 mg daily, and Amlodipine 5 mg daily. DASH/low salt diet recommended. Monitoring BP at home recommended.  Morbid obesity ([place]) Gained about 2-3 Lb since her last visit. We discussed benefits of wt loss as well as adverse effects of obesity. Consistency with healthy diet and physical activity recommended.  Bilateral leg pain Continue Ibuprofen 400 mg daily prn. We discussed side effects. No signs of GI bleed. We will continue monitoring CBC and BMP regularly.  Prediabetes A healthy lifestyle encouraged for diabetes prevention. Further recommendation will be given according to hemoglobin A1c result.  I spent a total of 43 minutes in both face to face and non face to face activities for this visit on the date of this encounter. During this time history was obtained and documented, examination was performed, prior labs reviewed, and assessment/plan discussed.  Return in about 5 months (around 10/28/2021) for cpe/folow up.  Ellora Varnum G.  Martin[place], MD  [facility]. [place] office.

## 2021-05-30 NOTE — Patient Instructions (Addendum)
A few things to remember from today's visit:  Hypokalemia - Plan: Basic metabolic panel  Acute right ankle pain  Hypertension, essential, benign - Plan: CBC, Basic metabolic panel  Prediabetes - Plan: Hemoglobin A1c  If you need refills please call your pharmacy. Do not use My Chart to request refills or for acute issues that need immediate attention.   ABC exercises for right ankle pain, it seems a mild sprain. Local icy hot may also help. No changes today. Monitor blood pressure at home.  Please be sure medication list is accurate. If a new problem present, please set up appointment sooner than planned today.

## 2021-05-30 NOTE — Assessment & Plan Note (Signed)
Gained about 2-3 Lb since her last visit. We discussed benefits of wt loss as well as adverse effects of obesity. Consistency with healthy diet and physical activity recommended.

## 2021-05-30 NOTE — Assessment & Plan Note (Signed)
Continue Ibuprofen 400 mg daily prn. We discussed side effects. No signs of GI bleed. We will continue monitoring CBC and BMP regularly.

## 2021-05-30 NOTE — Assessment & Plan Note (Signed)
Problem has been stable. Continue KLOR 10 meq daily.

## 2021-05-30 NOTE — Assessment & Plan Note (Signed)
BP adequately controlled. Continue current management: Spironolactone 25 mg,Losartan 100 mg daily, and Amlodipine 5 mg daily. DASH/low salt diet recommended. Monitoring BP at home recommended.

## 2021-05-30 NOTE — Assessment & Plan Note (Signed)
A healthy lifestyle encouraged for diabetes prevention. Further recommendation will be given according to hemoglobin A1c result.

## 2021-06-08 DIAGNOSIS — L814 Other melanin hyperpigmentation: Secondary | ICD-10-CM | POA: Diagnosis not present

## 2021-06-08 DIAGNOSIS — L728 Other follicular cysts of the skin and subcutaneous tissue: Secondary | ICD-10-CM | POA: Diagnosis not present

## 2021-06-09 ENCOUNTER — Telehealth: Payer: Self-pay | Admitting: Pharmacist

## 2021-06-09 NOTE — Chronic Care Management (AMB) (Signed)
Chronic Care Management Pharmacy Assistant   Name: Jennifer Davila  MRN: [id number] DOB: 10-Jun-1956  Reason for Encounter: Disease State / Hypertension and Hyperlipidemia Assessment Call   Conditions to be addressed/monitored: HTN and HLD  Recent office visits:  05/30/2021 Jennifer Martinique MD (PCP) - Patient was seen for Hypokalemia and additional issues. Referral to dermatology. Discontinued Hydrocodone and Tizanidine. Follow up in 5 months  Recent consult visits:  None  Hospital visits:  None  Medications: Outpatient Encounter Medications as of 06/09/2021  Medication Sig   acetaminophen (TYLENOL) 500 MG tablet Take 1-2 tablets (500-1,000 mg total) by mouth daily as needed for moderate pain (pain).   amLODipine (NORVASC) 5 MG tablet TAKE 1 TABLET BY MOUTH EVERYDAY AT BEDTIME   esomeprazole (NEXIUM) 40 MG capsule TAKE 1 CAPSULE BY MOUTH EVERY DAY   ibuprofen (ADVIL) 400 MG tablet Take 1 tablet (400 mg total) by mouth daily as needed.   losartan (COZAAR) 100 MG tablet Take 1 tablet (100 mg total) by mouth daily.   potassium chloride (KLOR-CON) 10 MEQ tablet TAKE 1 TABLET BY MOUTH EVERY DAY   pravastatin (PRAVACHOL) 20 MG tablet Take 1 tablet (20 mg total) by mouth daily.   spironolactone (ALDACTONE) 25 MG tablet TAKE 1 TABLET (25 MG TOTAL) BY MOUTH DAILY.   No facility-administered encounter medications on file as of 06/09/2021.  Fill History: esomeprazole (NEXIUM) capsule 03/15/2021 90   losartan (COZAAR) tablet 03/31/2021 90   pravastatin (PRAVACHOL) tablet 03/23/2021 90   spironolactone (ALDACTONE) tablet 03/17/2021 90   amLODIpine (NORVASC) tablet 03/31/2021 90   Reviewed chart prior to disease state call. Spoke with patient regarding BP  Recent Office Vitals: BP Readings from Last 3 Encounters:  05/30/21 128/80  12/28/20 136/80  09/21/20 128/70   Pulse Readings from Last 3 Encounters:  05/30/21 93  12/28/20 84  09/21/20 86    Wt Readings from Last 3  Encounters:  05/30/21 253 lb (114.8 kg)  12/28/20 251 lb 4 oz (114 kg)  09/21/20 251 lb (113.9 kg)     Kidney Function Lab Results  Component Value Date/Time   CREATININE 0.93 05/30/2021 11:38 AM   CREATININE 0.99 10/07/2020 09:37 AM   CREATININE 1.03 (H) 05/03/2016 11:52 AM   CREATININE 0.93 10/31/2015 10:30 AM   GFR 63.56 05/30/2021 11:38 AM   GFRNONAA 59 (L) 04/19/2020 10:51 AM   GFRNONAA 58 (L) 05/03/2016 11:52 AM   GFRAA 68 04/19/2020 10:51 AM   GFRAA 67 05/03/2016 11:52 AM    BMP Latest Ref Rng & Units 05/30/2021 12/28/2020 10/07/2020  Glucose 70 - 99 mg/dL 103(H) - 93  BUN 6 - 23 mg/dL 13 - 11  Creatinine 0.40 - 1.20 mg/dL 0.93 - 0.99  BUN/Creat Ratio 12 - 28 - - -  Sodium 135 - 145 mEq/L 140 - 142  Potassium 3.5 - 5.1 mEq/L 3.7 4.3 4.0  Chloride 96 - 112 mEq/L 102 - 104  CO2 19 - 32 mEq/L 31 - 30  Calcium 8.4 - 10.5 mg/dL 9.2 - 9.3    Current antihypertensive regimen:  Losartan 100 mg 1 tablet daily Amlodipine 5 mg 1 tablet daily at bedtime Spironolactone 25 mg 1 tablet daily  How often are you checking your Blood Pressure?     Current home BP readings:   What recent interventions/DTPs have been made by any provider to improve Blood Pressure control since last CPP Visit: None  Any recent hospitalizations or ED visits since last visit with CPP? No  What diet changes have been made to improve Blood Pressure Control?    What exercise is being done to improve your Blood Pressure Control?    Adherence Review: Is the patient currently on ACE/ARB medication? Yes Does the patient have >5 day gap between last estimated fill dates? No   06/09/2021 Name: Jennifer Davila MRN: [id number] DOB: 06-17-1956 Jennifer Davila is a 65 y.o. year old female who is a primary care patient of Davila, Malka So, MD.  Comprehensive medication review performed; Spoke to patient regarding cholesterol  Lipid Panel    Component Value Date/Time   CHOL 185 09/21/2020 1224   TRIG  167.0 (H) 09/21/2020 1224   HDL 41.90 09/21/2020 1224   LDLCALC 110 (H) 09/21/2020 1224    10-year ASCVD risk score: The 10-year ASCVD risk score (Arnett DK, et al., 2019) is: 9.8%   Values used to calculate the score:     Age: 65 years     Sex: Female     Is Non-Hispanic African American: Yes     Diabetic: No     Tobacco smoker: No     Systolic Blood Pressure: 038 mmHg     Is BP treated: Yes     HDL Cholesterol: 41.9 mg/dL     Total Cholesterol: 185 mg/dL  Current antihyperlipidemic regimen:  Pravastatin 20 mg 1 tablet daily  Previous antihyperlipidemic medications tried: None  ASCVD risk enhancing conditions: age >40 and current smoker  What recent interventions/DTPs have been made by any provider to improve Cholesterol control since last CPP Visit: None  Any recent hospitalizations or ED visits since last visit with CPP? No  What diet changes have been made to improve Cholesterol?    What exercise is being done to improve Cholesterol?    Adherence Review: Does the patient have >5 day gap between last estimated fill dates? No  Spoke with patient, she states she doesn't have time right now to do an assessment call or schedule an appointment due to her husbands health issues, she has asked for [place] to call her back in a couple months.  Care Gaps: AWV - Completed 12/19/2020 Zoster vaccine - never done Covid 19 booster 3 - overdue 07/17/2020 Flu vaccine - due Last BP - 128/80 on 05/30/2021  Star Rating Drugs: Losartan 100mg  - last filled 09/02/202290 DS  at [facility] Pravastatin 20mg  - last filled 008/25/2022 90DS at [place] Pharmacist Assistant [phone]

## 2021-07-03 ENCOUNTER — Other Ambulatory Visit: Payer: Self-pay | Admitting: Family Medicine

## 2021-07-03 DIAGNOSIS — K219 Gastro-esophageal reflux disease without esophagitis: Secondary | ICD-10-CM

## 2021-07-03 DIAGNOSIS — M79604 Pain in right leg: Secondary | ICD-10-CM

## 2021-07-04 ENCOUNTER — Telehealth: Payer: Self-pay

## 2021-07-04 NOTE — Telephone Encounter (Signed)
Patient called asking for lab results and was informed of results patient verbalized understanding.

## 2021-07-10 DIAGNOSIS — L728 Other follicular cysts of the skin and subcutaneous tissue: Secondary | ICD-10-CM | POA: Diagnosis not present

## 2021-07-10 DIAGNOSIS — L538 Other specified erythematous conditions: Secondary | ICD-10-CM | POA: Diagnosis not present

## 2021-07-10 DIAGNOSIS — L298 Other pruritus: Secondary | ICD-10-CM | POA: Diagnosis not present

## 2021-07-11 ENCOUNTER — Ambulatory Visit (HOSPITAL_COMMUNITY)
Admission: EM | Admit: 2021-07-11 | Discharge: 2021-07-11 | Disposition: A | Discharge status: Home / Self Care | Payer: Medicare HMO | Attending: Emergency Medicine | Admitting: Emergency Medicine

## 2021-07-11 ENCOUNTER — Telehealth (INDEPENDENT_AMBULATORY_CARE_PROVIDER_SITE_OTHER): Payer: Medicare HMO | Admitting: Physician Assistant

## 2021-07-11 ENCOUNTER — Encounter (HOSPITAL_COMMUNITY): Payer: Self-pay | Admitting: Emergency Medicine

## 2021-07-11 ENCOUNTER — Encounter: Payer: Self-pay | Admitting: Physician Assistant

## 2021-07-11 ENCOUNTER — Other Ambulatory Visit: Payer: Self-pay

## 2021-07-11 VITALS — Ht 61.0 in | Wt 248.0 lb

## 2021-07-11 DIAGNOSIS — R5381 Other malaise: Secondary | ICD-10-CM | POA: Diagnosis not present

## 2021-07-11 DIAGNOSIS — R5383 Other fatigue: Secondary | ICD-10-CM | POA: Diagnosis not present

## 2021-07-11 DIAGNOSIS — J111 Influenza due to unidentified influenza virus with other respiratory manifestations: Secondary | ICD-10-CM | POA: Diagnosis not present

## 2021-07-11 LAB — BASIC METABOLIC PANEL
Anion gap: 11 (ref 5–15)
BUN: 13 mg/dL (ref 8–23)
CO2: 28 mmol/L (ref 22–32)
Calcium: 9.1 mg/dL (ref 8.9–10.3)
Chloride: 97 mmol/L — ABNORMAL LOW (ref 98–111)
Creatinine, Ser: 1.18 mg/dL — ABNORMAL HIGH (ref 0.44–1.00)
GFR, Estimated: 51 mL/min — ABNORMAL LOW (ref >60)
Glucose, Bld: 105 mg/dL — ABNORMAL HIGH (ref 70–99)
Potassium: 3.5 mmol/L (ref 3.5–5.1)
Sodium: 136 mmol/L (ref 135–145)

## 2021-07-11 MED ORDER — ONDANSETRON HCL 4 MG PO TABS: 4.0000 mg | ORAL_TABLET | Freq: Four times a day (QID) | ORAL | 0 refills | Status: DC

## 2021-07-11 MED ORDER — LOPERAMIDE HCL 2 MG PO CAPS: 2.0000 mg | ORAL_CAPSULE | Freq: Four times a day (QID) | ORAL | 0 refills | Status: DC | PRN

## 2021-07-11 NOTE — ED Triage Notes (Signed)
Patient reports provider is concerned for patient's potasium dropping due to symptoms

## 2021-07-11 NOTE — ED Triage Notes (Signed)
07/07/2021 was the first day of not feeling well, symptoms worsened over the week end.  Patient has cold chills, loss of appetite, patient has diarrhea, cough, left ear ache.

## 2021-07-11 NOTE — Progress Notes (Signed)
TELEPHONE ENCOUNTER   Patient verbally agreed to telephone visit and is aware that copayment and coinsurance may apply. Patient was treated using telemedicine according to accepted telemedicine protocols.  Location of the patient: home Location of provider: [facility] Names of all persons participating in the telemedicine service and role in the encounter: Inda Coke, PA , Maude Leriche  Subjective:   Chief Complaint  Patient presents with   Diarrhea     HPI   Viral illness Patient reports that symptoms started Friday. Has been having significant diarrhea, going 2-3 times a day, nausea x 3 days she is not eating due to diarrhea, says she is taking sips.   She is having significant chills, body aches, headache, right ear pain, weakness. She is taking Mucinex and Tylenol. Had really bad chest pain on Friday.  Husband was sick initially, had significant cough, she said that he spread his illness to her.  She has hx of hypokalemia. Unable to check her blood pressure.  Patient Active Problem List   Diagnosis Date Noted   Prediabetes 05/30/2021   Bilateral leg pain 06/03/2020   Chronic urticaria 05/26/2020   Seasonal and perennial allergic rhinitis 05/26/2020   Positive ANA (antinuclear antibody) 05/26/2020   Iron deficiency anemia 07/07/2018   Hematochezia 06/26/2018   Diverticulosis of colon with hemorrhage    Leg pain 01/06/2018   Bilateral lower extremity edema 01/06/2018   Lower GI bleed 01/06/2018   Hypokalemia 01/06/2018   Constipation 01/06/2018   Leg cramping 10/18/2017   Vitamin D deficiency, unspecified 10/18/2017   GERD (gastroesophageal reflux disease) 10/18/2017   Hypertension, essential, benign 09/04/2017   Hyperlipidemia 09/04/2017   Morbid obesity ([place]) 09/04/2017   Vertigo 09/04/2017   Hip pain 05/25/2015   Social History   Tobacco Use   Smoking status: Former    Types: Cigarettes    Quit date: 10/02/1972    Years since quitting: 48.8    Smokeless tobacco: Never  Substance Use Topics   Alcohol use: No    Current Outpatient Medications:    acetaminophen (TYLENOL) 500 MG tablet, Take 1-2 tablets (500-1,000 mg total) by mouth daily as needed for moderate pain (pain)., Disp: 60 tablet, Rfl: 1   amLODipine (NORVASC) 5 MG tablet, TAKE 1 TABLET BY MOUTH EVERYDAY AT BEDTIME, Disp: 90 tablet, Rfl: 1   esomeprazole (NEXIUM) 40 MG capsule, TAKE 1 CAPSULE BY MOUTH EVERY DAY, Disp: 90 capsule, Rfl: 3   ibuprofen (ADVIL) 400 MG tablet, TAKE 1 TABLET (400 MG TOTAL) BY MOUTH DAILY AS NEEDED., Disp: 30 tablet, Rfl: 1   losartan (COZAAR) 100 MG tablet, Take 1 tablet (100 mg total) by mouth daily., Disp: 90 tablet, Rfl: 2   potassium chloride (KLOR-CON) 10 MEQ tablet, TAKE 1 TABLET BY MOUTH EVERY DAY, Disp: 90 tablet, Rfl: 2   pravastatin (PRAVACHOL) 20 MG tablet, Take 1 tablet (20 mg total) by mouth daily., Disp: 90 tablet, Rfl: 3   spironolactone (ALDACTONE) 25 MG tablet, TAKE 1 TABLET (25 MG TOTAL) BY MOUTH DAILY., Disp: 90 tablet, Rfl: 1 No Known Allergies  Assessment & Plan:   1. Malaise and fatigue   Due to profound weakness and lack of PO intake, as well as hx of hypokalemia, I advised patient to go to the ER for further evaluation. Suspect patient is significantly dehydrated and will require IVF.  No orders of the defined types were placed in this encounter.  No orders of the defined types were placed in this encounter.  Inda Coke, [place] 07/11/2021  Time spent with the patient: 10 minutes, spent in obtaining information about her symptoms, reviewing her previous labs, evaluations, and treatments, counseling her about her condition (please see the discussed topics above), and developing a plan to further investigate it; she had a number of questions which I addressed.

## 2021-07-11 NOTE — Discharge Instructions (Addendum)
They are most likely being caused by influenza A, your symptoms are consistent with the current presentation, influenza is a virus and will steadily improve with time  You can use zofran every 6 hours as needed for nausea, be mindful this medication may make you drowsy, take the first dose at home to see how it affects your body  You can use Imodium twice a day once in the morning once in the evening and food to help with diarrhea, and be mindful over use of this medication may cause opposite effect constipation  Continue to promote hydration throughout the day by using electrolyte replacement solution such as Gatorade, body armor, Pedialyte, which ever you have at home  Try eating bland foods such as bread, rice, toast, fruit which are easier on the stomach to digest, avoid foods that are overly spicy, overly seasoned or greasy   You can take Tylenol and/or Ibuprofen as needed for fever reduction and pain relief.   For cough: honey 1/2 to 1 teaspoon (you can dilute the honey in water or another fluid).  You can also use guaifenesin and dextromethorphan for cough. You can use a humidifier for chest congestion and cough.  If you don't have a humidifier, you can sit in the bathroom with the hot shower running.      For sore throat: try warm salt water gargles, cepacol lozenges, throat spray, warm tea or water with lemon/honey, popsicles or ice, or OTC cold relief medicine for throat discomfort.   For congestion: take a daily anti-histamine like Zyrtec, Claritin, and a oral decongestant, such as pseudoephedrine.  You can also use Flonase 1-2 sprays in each nostril daily.   It is important to stay hydrated: drink plenty of fluids (water, gatorade/powerade/pedialyte, juices, or teas) to keep your throat moisturized and help further relieve irritation/discomfort.

## 2021-07-11 NOTE — ED Provider Notes (Signed)
[facility]    CSN: [id number] Arrival date & time: 07/11/21  1031      History   Chief Complaint Chief Complaint  Patient presents with   URI    HPI Jennifer Davila is a 65 y.o. female.   Patient presents with fever, chills, body aches, nasal congestion, rhinorrhea, generalized abdominal pain, nausea without vomiting, diarrhea, nonproductive cough, chest soreness for 4 days. Last episode of diarrhea last night. Poor intake. Husband ill first. PCP concerned with hypokalemia, referred to ED and patient came to [facility]. Last take oral potassium 2 days ago, Has been taking all other medications. Has attempted use of Mucinex day and night, minimally helpful.   Past Medical History:  Diagnosis Date   Arthritis    Constipation    on stool softener   GERD (gastroesophageal reflux disease)    Hyperlipidemia    Hypertension    Trouble swallowing 2013   following lap band   Urticaria     Patient Active Problem List   Diagnosis Date Noted   Prediabetes 05/30/2021   Bilateral leg pain 06/03/2020   Chronic urticaria 05/26/2020   Seasonal and perennial allergic rhinitis 05/26/2020   Positive ANA (antinuclear antibody) 05/26/2020   Iron deficiency anemia 07/07/2018   Hematochezia 06/26/2018   Diverticulosis of colon with hemorrhage    Leg pain 01/06/2018   Bilateral lower extremity edema 01/06/2018   Lower GI bleed 01/06/2018   Hypokalemia 01/06/2018   Constipation 01/06/2018   Leg cramping 10/18/2017   Vitamin D deficiency, unspecified 10/18/2017   GERD (gastroesophageal reflux disease) 10/18/2017   Hypertension, essential, benign 09/04/2017   Hyperlipidemia 09/04/2017   Morbid obesity ([place]) 09/04/2017   Vertigo 09/04/2017   Hip pain 05/25/2015    Past Surgical History:  Procedure Laterality Date   ABDOMINAL HYSTERECTOMY     BREAST BIOPSY Left 11/04/2019    BENIGN LYMPH NODE WITH PARACORTICAL HYPERPLASIA   CHOLECYSTECTOMY     COLONOSCOPY N/A 01/08/2018    Procedure: COLONOSCOPY;  Surgeon: Yetta Flock, MD;  Location: [facility];  Service: Gastroenterology;  Laterality: N/A;   LAPAROSCOPIC GASTRIC BANDING     LAPAROSCOPIC REPAIR AND REMOVAL OF GASTRIC BAND  2013-2014    OB History   No obstetric history on file.      Home Medications    Prior to Admission medications   Medication Sig Start Date End Date Taking? Authorizing Provider  acetaminophen (TYLENOL) 500 MG tablet Take 1-2 tablets (500-1,000 mg total) by mouth daily as needed for moderate pain (pain). 03/22/21   Newt Minion, MD  amLODipine (NORVASC) 5 MG tablet TAKE 1 TABLET BY MOUTH EVERYDAY AT BEDTIME 12/19/20   Martinique, [name], MD  esomeprazole ([place]) 40 MG capsule TAKE 1 CAPSULE BY MOUTH EVERY DAY 07/03/21   [place], [name], MD  ibuprofen (ADVIL) 400 MG tablet TAKE 1 TABLET (400 MG TOTAL) BY MOUTH DAILY AS NEEDED. 07/03/21   [place], [name], MD  losartan (COZAAR) 100 MG tablet Take 1 tablet (100 mg total) by mouth daily. 12/28/20   Martin[place], [name], MD  potassium chloride (KLOR-CON) 10 MEQ tablet TAKE 1 TABLET BY MOUTH EVERY DAY 12/06/20   Martin[place], [name], MD  pravastatin (PRAVACHOL) 20 MG tablet Take 1 tablet (20 mg total) by mouth daily. 09/22/20   Martinique, [name], MD  spironolactone (ALDACTONE) 25 MG tablet TAKE 1 TABLET (25 MG TOTAL) BY MOUTH DAILY. 12/19/20   Martinique, [name], MD    Family History  Family History  Problem Relation Age of Onset   Hypertension Mother    Stroke Mother    Diabetes Father    Alcohol abuse Father    Kidney disease Father    Colon polyps Brother    Cancer Brother    Alcohol abuse Brother     Social History Social History   Tobacco Use   Smoking status: Former    Types: Cigarettes    Quit date: 10/02/1972    Years since quitting: 48.8   Smokeless tobacco: Never  Vaping Use   Vaping Use: Never used  Substance Use Topics   Alcohol use: No   Drug use: No     Allergies   Patient has no known allergies.   Review of  Systems Review of Systems  Constitutional:  Positive for chills and fever. Negative for activity change, appetite change, diaphoresis, fatigue and unexpected weight change.  HENT:  Positive for congestion and rhinorrhea. Negative for dental problem, drooling, ear discharge, ear pain, facial swelling, hearing loss, mouth sores, nosebleeds, postnasal drip, sinus pressure, sinus pain, sneezing, sore throat, tinnitus, trouble swallowing and voice change.   Respiratory:  Positive for cough. Negative for apnea, choking, chest tightness, shortness of breath, wheezing and stridor.   Cardiovascular:  Negative for chest pain, palpitations and leg swelling.  Gastrointestinal:  Positive for abdominal distention, diarrhea and nausea. Negative for abdominal pain, anal bleeding, blood in stool, constipation, rectal pain and vomiting.  Genitourinary: Negative.   Skin: Negative.   Neurological: Negative.     Physical Exam Triage Vital Signs ED Triage Vitals  Enc Vitals Group     BP 07/11/21 1205 (!) 154/93     Pulse Rate 07/11/21 1205 83     Resp 07/11/21 1205 (!) 22     Temp 07/11/21 1205 99.7 F (37.6 C)     Temp Source 07/11/21 1205 Oral     SpO2 07/11/21 1205 99 %     Weight --      Height --      Head Circumference --      Peak Flow --      Pain Score 07/11/21 1201 8     Pain Loc --      Pain Edu? --      Excl. in [place]? --    No data found.  Updated Vital Signs BP (!) 154/93 (BP Location: Right Arm) Comment (BP Location): large cuff   Pulse 83    Temp 99.7 F (37.6 C) (Oral)    Resp (!) 22    SpO2 99%   Visual Acuity Right Eye Distance:   Left Eye Distance:   Bilateral Distance:    Right Eye Near:   Left Eye Near:    Bilateral Near:     Physical Exam Constitutional:      Appearance: Normal appearance. She is normal weight.  HENT:     Head: Normocephalic.     Right Ear: Tympanic membrane, ear canal and external ear normal.     Left Ear: Tympanic membrane, ear canal and external  ear normal.     Nose: Congestion and rhinorrhea present.     Mouth/Throat:     Mouth: Mucous membranes are moist.     Pharynx: Posterior oropharyngeal erythema present.  Eyes:     Extraocular Movements: Extraocular movements intact.  Cardiovascular:     Rate and Rhythm: Normal rate and regular rhythm.     Pulses: Normal pulses.     Heart sounds: Normal  heart sounds.  Pulmonary:     Effort: Pulmonary effort is normal.     Breath sounds: Normal breath sounds.  Abdominal:     General: Abdomen is flat. Bowel sounds are normal.     Palpations: Abdomen is soft.  Musculoskeletal:     Cervical back: Normal range of motion.  Lymphadenopathy:     Cervical: Cervical adenopathy present.  Skin:    General: Skin is warm and dry.  Neurological:     General: No focal deficit present.     Mental Status: She is alert and oriented to person, place, and time. Mental status is at baseline.  Psychiatric:        Mood and Affect: Mood normal.        Behavior: Behavior normal.     UC Treatments / Results  Labs (all labs ordered are listed, but only abnormal results are displayed) Labs Reviewed - No data to display  EKG   Radiology No results found.  Procedures Procedures (including critical care time)  Medications Ordered in UC Medications - No data to display  Initial Impression / Assessment and Plan / UC Course  I have reviewed the triage vital signs and the nursing notes.  Pertinent labs & imaging results that were available during my care of the patient were reviewed by me and considered in my medical decision making (see chart for details).  Clinical Course as of 07/11/21 1225  Tue Jul 11, 2021  1583 Basic metabolic panel Please notify by phone only, difficulties working [employer] [AW]    Clinical Course User Index [AW] Hans Eden, NP    Influenza-like illness  BMP pending, will notify of concerning values encourage patient to begin taking potassium supplement as  prescribed 40 mg every 6 hours as needed Imodium 2 mg 4 times a day as needed Recommended increase fluid intake through use of water and electrolyte replacement substances while diarrhea persist   For worsening symptoms given strict precautions to go to nearest emergency department for evaluation of IV fluid rehydration Over-the-counter medications for symptom management Final Clinical Impressions(s) / UC Diagnoses   Final diagnoses:  None   Discharge Instructions   None    ED Prescriptions   None    PDMP not reviewed this encounter.   Hans Eden, NP 07/11/21 1228

## 2021-07-18 ENCOUNTER — Encounter (HOSPITAL_COMMUNITY): Payer: Self-pay | Admitting: Emergency Medicine

## 2021-07-18 ENCOUNTER — Observation Stay (HOSPITAL_COMMUNITY)
Admission: EM | Admit: 2021-07-18 | Discharge: 2021-07-20 | Disposition: A | Discharge status: Home / Self Care | Payer: Medicare HMO | Attending: Family Medicine | Admitting: Family Medicine

## 2021-07-18 ENCOUNTER — Other Ambulatory Visit: Payer: Self-pay

## 2021-07-18 ENCOUNTER — Emergency Department (HOSPITAL_COMMUNITY): Payer: Medicare HMO

## 2021-07-18 DIAGNOSIS — M6281 Muscle weakness (generalized): Secondary | ICD-10-CM | POA: Diagnosis not present

## 2021-07-18 DIAGNOSIS — K625 Hemorrhage of anus and rectum: Secondary | ICD-10-CM | POA: Diagnosis not present

## 2021-07-18 DIAGNOSIS — K644 Residual hemorrhoidal skin tags: Secondary | ICD-10-CM | POA: Diagnosis not present

## 2021-07-18 DIAGNOSIS — K219 Gastro-esophageal reflux disease without esophagitis: Secondary | ICD-10-CM | POA: Diagnosis present

## 2021-07-18 DIAGNOSIS — Z79899 Other long term (current) drug therapy: Secondary | ICD-10-CM | POA: Insufficient documentation

## 2021-07-18 DIAGNOSIS — Z87891 Personal history of nicotine dependence: Secondary | ICD-10-CM | POA: Diagnosis not present

## 2021-07-18 DIAGNOSIS — U071 COVID-19: Secondary | ICD-10-CM | POA: Diagnosis not present

## 2021-07-18 DIAGNOSIS — K921 Melena: Secondary | ICD-10-CM | POA: Diagnosis present

## 2021-07-18 DIAGNOSIS — I10 Essential (primary) hypertension: Secondary | ICD-10-CM | POA: Insufficient documentation

## 2021-07-18 DIAGNOSIS — R109 Unspecified abdominal pain: Secondary | ICD-10-CM

## 2021-07-18 DIAGNOSIS — R252 Cramp and spasm: Secondary | ICD-10-CM | POA: Diagnosis present

## 2021-07-18 DIAGNOSIS — R262 Difficulty in walking, not elsewhere classified: Secondary | ICD-10-CM | POA: Diagnosis not present

## 2021-07-18 DIAGNOSIS — E785 Hyperlipidemia, unspecified: Secondary | ICD-10-CM | POA: Diagnosis present

## 2021-07-18 DIAGNOSIS — K922 Gastrointestinal hemorrhage, unspecified: Principal | ICD-10-CM | POA: Insufficient documentation

## 2021-07-18 DIAGNOSIS — E876 Hypokalemia: Secondary | ICD-10-CM | POA: Diagnosis not present

## 2021-07-18 LAB — CBC
HCT: 37.9 % (ref 36.0–46.0)
Hemoglobin: 11.7 g/dL — ABNORMAL LOW (ref 12.0–15.0)
MCH: 23.3 pg — ABNORMAL LOW (ref 26.0–34.0)
MCHC: 30.9 g/dL (ref 30.0–36.0)
MCV: 75.3 fL — ABNORMAL LOW (ref 80.0–100.0)
Platelets: 325 10*3/uL (ref 150–400)
RBC: 5.03 MIL/uL (ref 3.87–5.11)
RDW: 14.1 % (ref 11.5–15.5)
WBC: 6.7 10*3/uL (ref 4.0–10.5)
nRBC: 0 % (ref 0.0–0.2)

## 2021-07-18 LAB — CBC WITH DIFFERENTIAL/PLATELET
Abs Immature Granulocytes: 0.02 10*3/uL (ref 0.00–0.07)
Basophils Absolute: 0 10*3/uL (ref 0.0–0.1)
Basophils Relative: 0 %
Eosinophils Absolute: 0.2 10*3/uL (ref 0.0–0.5)
Eosinophils Relative: 3 %
HCT: 39.9 % (ref 36.0–46.0)
Hemoglobin: 12.1 g/dL (ref 12.0–15.0)
Immature Granulocytes: 0 %
Lymphocytes Relative: 24 %
Lymphs Abs: 1.6 10*3/uL (ref 0.7–4.0)
MCH: 22.7 pg — ABNORMAL LOW (ref 26.0–34.0)
MCHC: 30.3 g/dL (ref 30.0–36.0)
MCV: 75 fL — ABNORMAL LOW (ref 80.0–100.0)
Monocytes Absolute: 0.4 10*3/uL (ref 0.1–1.0)
Monocytes Relative: 6 %
Neutro Abs: 4.5 10*3/uL (ref 1.7–7.7)
Neutrophils Relative %: 67 %
Platelets: 359 10*3/uL (ref 150–400)
RBC: 5.32 MIL/uL — ABNORMAL HIGH (ref 3.87–5.11)
RDW: 14.1 % (ref 11.5–15.5)
WBC: 6.8 10*3/uL (ref 4.0–10.5)
nRBC: 0 % (ref 0.0–0.2)

## 2021-07-18 LAB — TYPE AND SCREEN
ABO/RH(D): O POS
Antibody Screen: NEGATIVE

## 2021-07-18 LAB — RESP PANEL BY RT-PCR (FLU A&B, COVID) ARPGX2
Influenza A by PCR: NEGATIVE
Influenza B by PCR: NEGATIVE
SARS Coronavirus 2 by RT PCR: POSITIVE — AB

## 2021-07-18 LAB — COMPREHENSIVE METABOLIC PANEL
ALT: 14 U/L (ref 0–44)
AST: 15 U/L (ref 15–41)
Albumin: 4.2 g/dL (ref 3.5–5.0)
Alkaline Phosphatase: 99 U/L (ref 38–126)
Anion gap: 8 (ref 5–15)
BUN: 14 mg/dL (ref 8–23)
CO2: 27 mmol/L (ref 22–32)
Calcium: 9 mg/dL (ref 8.9–10.3)
Chloride: 104 mmol/L (ref 98–111)
Creatinine, Ser: 0.92 mg/dL (ref 0.44–1.00)
GFR, Estimated: >60 mL/min (ref >60)
Glucose, Bld: 129 mg/dL — ABNORMAL HIGH (ref 70–99)
Potassium: 3.9 mmol/L (ref 3.5–5.1)
Sodium: 139 mmol/L (ref 135–145)
Total Bilirubin: 0.7 mg/dL (ref 0.3–1.2)
Total Protein: 7.9 g/dL (ref 6.5–8.1)

## 2021-07-18 LAB — POC OCCULT BLOOD, ED: Fecal Occult Bld: POSITIVE — AB

## 2021-07-18 LAB — PROTIME-INR
INR: 1.1 (ref 0.8–1.2)
Prothrombin Time: 13.7 seconds (ref 11.4–15.2)

## 2021-07-18 LAB — TROPONIN I (HIGH SENSITIVITY)
Troponin I (High Sensitivity): 4 ng/L (ref <18)
Troponin I (High Sensitivity): 5 ng/L (ref <18)

## 2021-07-18 MED ORDER — AMLODIPINE BESYLATE 5 MG PO TABS
5.0000 mg | ORAL_TABLET | Freq: Every day | ORAL | Status: DC
  Administered 2021-07-19: 5 mg via ORAL
  Filled 2021-07-18: qty 1

## 2021-07-18 MED ORDER — ONDANSETRON HCL 4 MG/2ML IJ SOLN: 4.0000 mg | Freq: Four times a day (QID) | INTRAMUSCULAR | Status: DC | PRN

## 2021-07-18 MED ORDER — ONDANSETRON HCL 4 MG PO TABS: 4.0000 mg | ORAL_TABLET | Freq: Four times a day (QID) | ORAL | Status: DC | PRN

## 2021-07-18 MED ORDER — PANTOPRAZOLE SODIUM 40 MG PO TBEC
40.0000 mg | DELAYED_RELEASE_TABLET | Freq: Every day | ORAL | Status: DC
  Administered 2021-07-19 – 2021-07-20 (×2): 40 mg via ORAL
  Filled 2021-07-18 (×3): qty 1

## 2021-07-18 MED ORDER — PANTOPRAZOLE 80MG IVPB - SIMPLE MED
80.0000 mg | Freq: Once | INTRAVENOUS | Status: AC
  Administered 2021-07-18: 80 mg via INTRAVENOUS
  Filled 2021-07-18: qty 80

## 2021-07-18 MED ORDER — ACETAMINOPHEN 650 MG RE SUPP: 650.0000 mg | Freq: Four times a day (QID) | RECTAL | Status: DC | PRN

## 2021-07-18 MED ORDER — ACETAMINOPHEN 325 MG PO TABS
650.0000 mg | ORAL_TABLET | Freq: Four times a day (QID) | ORAL | Status: DC | PRN
  Administered 2021-07-19: 01:00:00 650 mg via ORAL
  Filled 2021-07-18: qty 2

## 2021-07-18 MED ORDER — PRAVASTATIN SODIUM 20 MG PO TABS: 20.0000 mg | ORAL_TABLET | Freq: Every day | ORAL | Status: DC

## 2021-07-18 NOTE — Assessment & Plan Note (Signed)
Continue statin. 

## 2021-07-18 NOTE — Assessment & Plan Note (Signed)
Continue PPI ?

## 2021-07-18 NOTE — Assessment & Plan Note (Signed)
Patient reports chronic leg cramping. Explained to her that she she is to follow-up with PCP for further work-up and treatment for her chronic leg pain.

## 2021-07-18 NOTE — ED Provider Notes (Signed)
Emergency Medicine Provider Triage Evaluation Note  Jennifer Davila , a 65 y.o. female  was evaluated in triage.  Pt complains of rectal bleeding.  She reports a history of the same.  Patient states that she was hospitalized and had to have a blood transfusion in the past and had an colonoscopy and they were unable to find the source.  She had 1 episode of bloody stool last night.  This morning she went to make a bowel movement and had passage of a large froggy amount of bright red blood and clots without stool.  She states she feels lightheaded and as if she might pass out.  She is not on any blood thinners and denies abdominal pain.  Review of Systems  Positive: Rectal bleeding, lightheadedness Negative: Fever chills or abdominal pain  Physical Exam  BP (!) 162/105 (BP Location: Right Arm)    Pulse 90    Temp 98.9 F (37.2 C) (Oral)    Resp 18    Ht 5\' 1"  (1.549 m)    Wt 112.5 kg    SpO2 100%    BMI 46.86 kg/m  Gen:   Awake, no distress   Resp:  Normal effort  MSK:   Moves extremities without difficulty  Other:  RRR  Medical Decision Making  Medically screening exam initiated at 1:16 PM.  Appropriate orders placed.  Jennifer Davila was informed that the remainder of the evaluation will be completed by another provider, this initial triage assessment does not replace that evaluation, and the importance of remaining in the ED until their evaluation is complete.  Work-up initiated   Margarita Mail, PA-C 07/18/21 1317    Lacretia Leigh, MD 07/19/21 [phone]

## 2021-07-18 NOTE — Assessment & Plan Note (Addendum)
FOBT+ but no stool overnight, no clinical bleeding.  Hgb stable. - Outpatient GI follow up

## 2021-07-18 NOTE — Assessment & Plan Note (Signed)
Reports diffuse abdominal pain. Likely in the setting of constipation. Will get x-ray abdomen and monitor results.

## 2021-07-18 NOTE — H&P (Signed)
History and Physical    Jennifer Davila TMH:[id number] DOB: [birth year] DOA: 07/18/2021  PCP: Martinique, [name], MD   Chief Complaint: Blood in the stool  HPI: Jennifer Davila is a 65 y.o. female with medical history significant of diverticular bleed, GERD, HLD, HTN, chronic constipation.  Presents with complaints of 1 to 2 months of loose bowel movement with intermittent blood.  Tells me that she actually has rectal bleeding since last night more frequently.  No blood reported as dark [place] in color.  Also reports abdominal pain which is diffuse in nature.  Reports nausea no vomiting.  No fever no chills.  Nobody sick around her.  No shortness of breath no cough. She also reports dizziness and lightheadedness. No change in medication.  Denies any anticoagulation or blood thinners.   Review of Systems: ROS   As per HPI otherwise 10 point review of systems negative.   No Known Allergies  Past Medical History:  Diagnosis Date   Arthritis    Constipation    on stool softener   GERD (gastroesophageal reflux disease)    Hyperlipidemia    Hypertension    Trouble swallowing 2013   following lap band   Urticaria     Past Surgical History:  Procedure Laterality Date   ABDOMINAL HYSTERECTOMY     BREAST BIOPSY Left 11/04/2019    BENIGN LYMPH NODE WITH PARACORTICAL HYPERPLASIA   CHOLECYSTECTOMY     COLONOSCOPY N/A 01/08/2018   Procedure: COLONOSCOPY;  Surgeon: Yetta Flock, MD;  Location: [facility];  Service: Gastroenterology;  Laterality: N/A;   LAPAROSCOPIC GASTRIC BANDING     LAPAROSCOPIC REPAIR AND REMOVAL OF GASTRIC BAND  2013-2014     reports that she quit smoking about 48 years ago. Her smoking use included cigarettes. She has never used smokeless tobacco. She reports that she does not drink alcohol and does not use drugs.  Family History  Problem Relation Age of Onset   Hypertension Mother    Stroke Mother    Diabetes Father    Alcohol abuse Father     Kidney disease Father    Colon polyps Brother    Cancer Brother    Alcohol abuse Brother     Prior to Admission medications   Medication Sig Start Date End Date Taking? Authorizing Provider  acetaminophen (TYLENOL) 500 MG tablet Take 1-2 tablets (500-1,000 mg total) by mouth daily as needed for moderate pain (pain). 03/22/21   Newt Minion, MD  amLODipine (NORVASC) 5 MG tablet TAKE 1 TABLET BY MOUTH EVERYDAY AT BEDTIME 12/19/20   Martinique, [name], MD  esomeprazole ([place]) 40 MG capsule TAKE 1 CAPSULE BY MOUTH EVERY DAY 07/03/21   [place], [name], MD  ibuprofen (ADVIL) 400 MG tablet TAKE 1 TABLET (400 MG TOTAL) BY MOUTH DAILY AS NEEDED. 07/03/21   [place], [name], MD  loperamide (IMODIUM) 2 MG capsule Take 1 capsule (2 mg total) by mouth 4 (four) times daily as needed for diarrhea or loose stools. 07/11/21   White, Leitha Schuller, NP  losartan (COZAAR) 100 MG tablet Take 1 tablet (100 mg total) by mouth daily. 12/28/20   [place], [name], MD  ondansetron (ZOFRAN) 4 MG tablet Take 1 tablet (4 mg total) by mouth every 6 (six) hours. 07/11/21   White, Leitha Schuller, NP  potassium chloride (KLOR-CON) 10 MEQ tablet TAKE 1 TABLET BY MOUTH EVERY DAY 12/06/20   [place], [name], MD  pravastatin (PRAVACHOL) 20 MG tablet Take 1 tablet (  20 mg total) by mouth daily. 09/22/20   [place], [name], MD  spironolactone (ALDACTONE) 25 MG tablet TAKE 1 TABLET (25 MG TOTAL) BY MOUTH DAILY. 12/19/20   [place], [name], MD    Physical Exam: Vitals:   07/18/21 1613 07/18/21 1709 07/18/21 1846 07/18/21 2041  BP: (!) 156/99 (!) 166/98 (!) 147/72 (!) 147/82  Pulse: 78 82 70 79  Resp: 18 18 18 16   Temp:    98.5 F (36.9 C)  TempSrc:    Oral  SpO2: 100% 99% 99% 98%  Weight:      Height:       General: Appear in mild distress, no Rash; Oral Mucosa Clear, moist. no Abnormal Neck Mass Or lumps, Conjunctiva normal  Cardiovascular: S1 and S2 Present, no Murmur, Respiratory: good respiratory effort, Bilateral Air entry present and  CTA, no Crackles, no wheezes Abdomen: Bowel Sound present, Soft and no tenderness Extremities: no Pedal edema Neurology: alert and oriented to time, place, and person affect appropriate. no new focal deficit Gait not checked due to patient safety concerns   Labs on Admission: I have personally reviewed the patients's labs and imaging studies.  Assessment/Plan * GI bleed Presents with complaints of blood in the stool.  Hemoccult positive.  Hemoglobin remained stable.  Prior history of diverticular bleed requiring colonoscopy. Not on any aspirin, Plavix, anticoagulation, not taking any medicines on a regular basis. Currently plan is monitor H&H, keep on clear liquid diet, if H&H remaining stable likely can go home.  Constipation Reports diffuse abdominal pain. Likely in the setting of constipation. Will get x-ray abdomen and monitor results.  Hypokalemia Potassium level stable.  Monitor.  GERD (gastroesophageal reflux disease) Continue PPI.  Hypertension, essential, benign Blood pressure relatively elevated at the time of admission.  Currently improving.  Monitor.  Hyperlipidemia Continue statin.  Leg cramping Patient reports chronic leg cramping. Explained to her that she she is to follow-up with PCP for further work-up and treatment for her chronic leg pain.       Code Status: Full Code   DVT Prophylaxis:  SCDs Start: 07/18/21 2019   Family Communication: None at bedside. Admission status: Emergency   Certification: The appropriate patient status for this patient is OBSERVATION. Observation status is judged to be reasonable and necessary in order to provide the required intensity of service to ensure the patient's safety. The patient's presenting symptoms, physical exam findings, and initial radiographic and laboratory data in the context of their medical condition is felt to place them at decreased risk for further clinical deterioration. Furthermore, it is anticipated  that the patient will be medically stable for discharge from the hospital within 2 midnights of admission.   Berle Mull MD [facility] If 7PM-7AM, please contact night-coverage [email or web address]  07/18/2021, 9:00 PM

## 2021-07-18 NOTE — Assessment & Plan Note (Addendum)
BP slightly elevatd -Continue losartan -reduce amlodipine while on Paxlovid

## 2021-07-18 NOTE — ED Provider Notes (Signed)
[facility] DEPT Provider Note   CSN: [id number] Arrival date & time: 07/18/21  1300     History Chief Complaint  Patient presents with   Rectal Bleeding    Jennifer Davila is a 65 y.o. female.  HPI     Loose bowels for 1-2 months but didn't have blood Rectal bleeding started last night, thought it was grape juice she had at first, then this morning drank a cup of coffee, and then there was blood.  Stool was black tarry, then red blood in the toilet bowl.  2 episodes so far  Was admitted 2019, had blood transfusion for drop to 7.1 This feels similar, like the beginning of last time, but was all blood--found to have diverticula, no sign of active bleeding, did see internal hemorrhoids  Having nausea  Abdominal pain, RL side will sometimes radiate Felt cold this AM, no fevers today Felt lightheaded   Last week had the flu, husband has lung cancer, had increase in diarrhea then, was not eating, still not having taste    Past Medical History:  Diagnosis Date   Arthritis    Constipation    on stool softener   GERD (gastroesophageal reflux disease)    Hyperlipidemia    Hypertension    Trouble swallowing 2013   following lap band   Urticaria     Patient Active Problem List   Diagnosis Date Noted   GI bleed 07/18/2021   Prediabetes 05/30/2021   Bilateral leg pain 06/03/2020   Chronic urticaria 05/26/2020   Seasonal and perennial allergic rhinitis 05/26/2020   Positive ANA (antinuclear antibody) 05/26/2020   Iron deficiency anemia 07/07/2018   Hematochezia 06/26/2018   Diverticulosis of colon with hemorrhage    Leg pain 01/06/2018   Bilateral lower extremity edema 01/06/2018   Lower GI bleed 01/06/2018   Hypokalemia 01/06/2018   Constipation 01/06/2018   Leg cramping 10/18/2017   Vitamin D deficiency, unspecified 10/18/2017   GERD (gastroesophageal reflux disease) 10/18/2017   Hypertension, essential, benign 09/04/2017    Hyperlipidemia 09/04/2017   Morbid obesity ([place]) 09/04/2017   Vertigo 09/04/2017   Hip pain 05/25/2015    Past Surgical History:  Procedure Laterality Date   ABDOMINAL HYSTERECTOMY     BREAST BIOPSY Left 11/04/2019    BENIGN LYMPH NODE WITH PARACORTICAL HYPERPLASIA   CHOLECYSTECTOMY     COLONOSCOPY N/A 01/08/2018   Procedure: COLONOSCOPY;  Surgeon: Yetta Flock, MD;  Location: [facility];  Service: Gastroenterology;  Laterality: N/A;   LAPAROSCOPIC GASTRIC BANDING     LAPAROSCOPIC REPAIR AND REMOVAL OF GASTRIC BAND  2013-2014     OB History   No obstetric history on file.     Family History  Problem Relation Age of Onset   Hypertension Mother    Stroke Mother    Diabetes Father    Alcohol abuse Father    Kidney disease Father    Colon polyps Brother    Cancer Brother    Alcohol abuse Brother     Social History   Tobacco Use   Smoking status: Former    Types: Cigarettes    Quit date: 10/02/1972    Years since quitting: 48.8   Smokeless tobacco: Never  Vaping Use   Vaping Use: Never used  Substance Use Topics   Alcohol use: No   Drug use: No    Home Medications Prior to Admission medications   Medication Sig Start Date End Date Taking? Authorizing Provider  acetaminophen (TYLENOL)  500 MG tablet Take 1-2 tablets (500-1,000 mg total) by mouth daily as needed for moderate pain (pain). 03/22/21   Newt Minion, MD  amLODipine (NORVASC) 5 MG tablet TAKE 1 TABLET BY MOUTH EVERYDAY AT BEDTIME 12/19/20   [place], [name], MD  esomeprazole ([place]) 40 MG capsule TAKE 1 CAPSULE BY MOUTH EVERY DAY 07/03/21   [place], [name], MD  ibuprofen (ADVIL) 400 MG tablet TAKE 1 TABLET (400 MG TOTAL) BY MOUTH DAILY AS NEEDED. 07/03/21   [place], [name], MD  loperamide (IMODIUM) 2 MG capsule Take 1 capsule (2 mg total) by mouth 4 (four) times daily as needed for diarrhea or loose stools. 07/11/21   White, Leitha Schuller, NP  losartan (COZAAR) 100 MG tablet Take 1 tablet (100 mg total)  by mouth daily. 12/28/20   [place], [name], MD  ondansetron (ZOFRAN) 4 MG tablet Take 1 tablet (4 mg total) by mouth every 6 (six) hours. 07/11/21   White, Leitha Schuller, NP  potassium chloride (KLOR-CON) 10 MEQ tablet TAKE 1 TABLET BY MOUTH EVERY DAY 12/06/20   [place], [name], MD  pravastatin (PRAVACHOL) 20 MG tablet Take 1 tablet (20 mg total) by mouth daily. 09/22/20   [place], [name], MD  spironolactone (ALDACTONE) 25 MG tablet TAKE 1 TABLET (25 MG TOTAL) BY MOUTH DAILY. 12/19/20   [place], [name], MD    Allergies    Patient has no known allergies.  Review of Systems   Review of Systems  Constitutional:  Positive for activity change, appetite change and fatigue. Negative for fever.  HENT:  Negative for sore throat.   Eyes:  Negative for visual disturbance.  Respiratory:  Negative for cough and shortness of breath.   Cardiovascular:  Positive for chest pain.  Gastrointestinal:  Positive for abdominal pain, anal bleeding, blood in stool, diarrhea and nausea. Negative for constipation and vomiting.  Genitourinary:  Negative for difficulty urinating.  Musculoskeletal:  Negative for back pain and neck pain.  Skin:  Negative for rash.  Neurological:  Positive for light-headedness. Negative for syncope and headaches.   Physical Exam Updated Vital Signs BP (!) 166/98    Pulse 82    Temp 98.9 F (37.2 C) (Oral)    Resp 18    Ht 5\' 1"  (1.549 m)    Wt 112.5 kg    SpO2 99%    BMI 46.86 kg/m   Physical Exam Vitals and nursing note reviewed.  Constitutional:      General: She is not in acute distress.    Appearance: She is well-developed. She is not diaphoretic.  HENT:     Head: Normocephalic and atraumatic.  Eyes:     Conjunctiva/sclera: Conjunctivae normal.  Cardiovascular:     Rate and Rhythm: Normal rate and regular rhythm.     Heart sounds: Normal heart sounds. No murmur heard.   No friction rub. No gallop.  Pulmonary:     Effort: Pulmonary effort is normal. No respiratory distress.      Breath sounds: Normal breath sounds. No wheezing or rales.  Abdominal:     General: There is no distension.     Palpations: Abdomen is soft.     Tenderness: There is abdominal tenderness (mild diffuse). There is no guarding.  Genitourinary:    Comments: Maroon stool on rectal exam External hemorrhoid Musculoskeletal:        General: No tenderness.     Cervical back: Normal range of motion.  Skin:    General: Skin  is warm and dry.     Findings: No erythema or rash.  Neurological:     Mental Status: She is alert and oriented to person, place, and time.    ED Results / Procedures / Treatments   Labs (all labs ordered are listed, but only abnormal results are displayed) Labs Reviewed  COMPREHENSIVE METABOLIC PANEL - Abnormal; Notable for the following components:      Result Value   Glucose, Bld 129 (*)    All other components within normal limits  CBC WITH DIFFERENTIAL/PLATELET - Abnormal; Notable for the following components:   RBC 5.32 (*)    MCV 75.0 (*)    MCH 22.7 (*)    All other components within normal limits  POC OCCULT BLOOD, ED - Abnormal; Notable for the following components:   Fecal Occult Bld POSITIVE (*)    All other components within normal limits  RESP PANEL BY RT-PCR (FLU A&B, COVID) [id number]  PROTIME-INR  TYPE AND SCREEN  TROPONIN I (HIGH SENSITIVITY)    EKG None  Radiology No results found.  Procedures Procedures   Medications Ordered in ED Medications  pantoprazole (PROTONIX) 80 mg /NS 100 mL IVPB (has no administration in time range)    ED Course  I have reviewed the triage vital signs and the nursing notes.  Pertinent labs & imaging results that were available during my care of the patient were reviewed by me and considered in my medical decision making (see chart for details).    MDM Rules/Calculators/A&P                          65 year old female with a history of hypertension, hyperlipidemia, admission for GI bleed in 2019 with  drop in hemoglobin requiring transfusion, presumed diverticular bleed, who presents with concern for dark stool and rectal bleeding.  Reports recent flulike symptoms which are resolving, however bleeding symptoms beginning last night.  Differential diagnosis includes lower GI bleed, including diverticular bleed given history, upper GI bleed given her description of black tarry stool, colitis, or bleeding internal hemorrhoids.    Has hematochezia on rectal exam.  Hemoglobin normal, but has only had 2 episodes so far, and has history of more significant bleeding in the past.  She is not on anticoagulation.  She does not have any focal abdominal tenderness and by exam and history have low suspicion for acute diverticulitis, perforation, or other acute surgical intra-abdominal abnormality.  Does report on ROS CP last night, EKG without acute findings, troponin negative-no symptoms at this time.  COVID/flu pending in setting of illness last week.   Given bolus of Protonix, and will admit for observation for GI bleed.  Has not seen gastroenterology or have a GI physician since her last admission.       Final Clinical Impression(s) / ED Diagnoses Final diagnoses:  Gastrointestinal hemorrhage, unspecified gastrointestinal hemorrhage type    Rx / DC Orders ED Discharge Orders     None        Gareth Morgan, MD 07/18/21 1744

## 2021-07-18 NOTE — ED Triage Notes (Signed)
Patient reports rectal bleeding that she first noticed last night. Reports one episode last night and one this morning of bright red blood. Denies any blood thinners. Reports loose bowels over the past week with the flu.

## 2021-07-18 NOTE — Assessment & Plan Note (Signed)
Potassium level stable.  Monitor.

## 2021-07-19 ENCOUNTER — Other Ambulatory Visit (HOSPITAL_COMMUNITY): Payer: Self-pay

## 2021-07-19 DIAGNOSIS — U071 COVID-19: Secondary | ICD-10-CM | POA: Diagnosis present

## 2021-07-19 DIAGNOSIS — K922 Gastrointestinal hemorrhage, unspecified: Secondary | ICD-10-CM | POA: Diagnosis not present

## 2021-07-19 DIAGNOSIS — E785 Hyperlipidemia, unspecified: Secondary | ICD-10-CM

## 2021-07-19 DIAGNOSIS — I10 Essential (primary) hypertension: Secondary | ICD-10-CM | POA: Diagnosis not present

## 2021-07-19 DIAGNOSIS — K219 Gastro-esophageal reflux disease without esophagitis: Secondary | ICD-10-CM | POA: Diagnosis not present

## 2021-07-19 LAB — COMPREHENSIVE METABOLIC PANEL
ALT: 14 U/L (ref 0–44)
AST: 15 U/L (ref 15–41)
Albumin: 4.1 g/dL (ref 3.5–5.0)
Alkaline Phosphatase: 93 U/L (ref 38–126)
Anion gap: 9 (ref 5–15)
BUN: 15 mg/dL (ref 8–23)
CO2: 24 mmol/L (ref 22–32)
Calcium: 9.1 mg/dL (ref 8.9–10.3)
Chloride: 104 mmol/L (ref 98–111)
Creatinine, Ser: 1.02 mg/dL — ABNORMAL HIGH (ref 0.44–1.00)
GFR, Estimated: >60 mL/min (ref >60)
Glucose, Bld: 100 mg/dL — ABNORMAL HIGH (ref 70–99)
Potassium: 3.5 mmol/L (ref 3.5–5.1)
Sodium: 137 mmol/L (ref 135–145)
Total Bilirubin: 0.9 mg/dL (ref 0.3–1.2)
Total Protein: 7.8 g/dL (ref 6.5–8.1)

## 2021-07-19 LAB — HIV ANTIBODY (ROUTINE TESTING W REFLEX): HIV Screen 4th Generation wRfx: NONREACTIVE

## 2021-07-19 LAB — CBC
HCT: 39.1 % (ref 36.0–46.0)
Hemoglobin: 12.1 g/dL (ref 12.0–15.0)
MCH: 23.1 pg — ABNORMAL LOW (ref 26.0–34.0)
MCHC: 30.9 g/dL (ref 30.0–36.0)
MCV: 74.6 fL — ABNORMAL LOW (ref 80.0–100.0)
Platelets: 364 10*3/uL (ref 150–400)
RBC: 5.24 MIL/uL — ABNORMAL HIGH (ref 3.87–5.11)
RDW: 14 % (ref 11.5–15.5)
WBC: 7.8 10*3/uL (ref 4.0–10.5)
nRBC: 0 % (ref 0.0–0.2)

## 2021-07-19 MED ORDER — AMLODIPINE BESYLATE 5 MG PO TABS: 2.5000 mg | ORAL_TABLET | Freq: Every day | ORAL | 1 refills | Status: DC

## 2021-07-19 MED ORDER — NIRMATRELVIR/RITONAVIR (PAXLOVID)TABLET
3.0000 | ORAL_TABLET | Freq: Two times a day (BID) | ORAL | 0 refills | Status: DC
  Filled 2021-07-19: qty 30, 5d supply, fill #0

## 2021-07-19 MED ORDER — NIRMATRELVIR/RITONAVIR (PAXLOVID)TABLET
3.0000 | ORAL_TABLET | Freq: Two times a day (BID) | ORAL | Status: DC
  Administered 2021-07-19 – 2021-07-20 (×3): 3 via ORAL
  Filled 2021-07-19: qty 30

## 2021-07-19 NOTE — Progress Notes (Signed)
°  Progress Note    Jennifer Davila   [id number]:[id number]  DOB: Apr 25, 1956  DOA: 07/18/2021     0 Date of Service: 07/19/2021     Brief summary: Jennifer Davila is a 65 y.o. F with hx diverticular bleed, HTN, chronic constipation who p/w 1 to 2 months of loose bowel movement with intermittent blood and then more frequent rectal bleeding in the last 24 hours with abdominal cramping.  Not on anticoagulants.  12/20: In the ER, FOBT+, Hgb 12 g/dL. Incidentally COVID+        Assessment and Plan * COVID-19 virus infection and generalized weakness - Start Paxlovid -Hold statin   GI bleed FOBT+ but no stool overnight, no clinical bleeding.  Hgb stable. - Outpatient GI follow up    Hypertension, essential, benign BP slightly elevatd -Continue losartan -reduce amlodipine while on Paxlovid      Subjective:  Patient had no bowel movements overnight.  Jennifer Davila has had no rectal bleeding overnight.  Jennifer Davila has no dyspnea, no cough.  Jennifer Davila feels tired, Jennifer Davila feels weak, Jennifer Davila feels like her legs are going to give way  Objective Vitals:   07/19/21 0615 07/19/21 0936 07/19/21 1246 07/19/21 1642  BP: 127/71 (!) 121/103 (!) 155/92 (!) 157/69  Pulse: 72 74 87 88  Resp: 17 17 17 17   Temp: 98.5 F (36.9 C) 98.1 F (36.7 C) 98 F (36.7 C) 98.3 F (36.8 C)  TempSrc: Oral Oral Oral Oral  SpO2: 96%  98% 100%  Weight:      Height:       112.5 kg  Vital signs were reviewed and unremarkable.   Exam Physical Exam Constitutional:      Appearance: Jennifer Davila is obese. Jennifer Davila is not ill-appearing.  Eyes:     General: No scleral icterus.    Pupils: Pupils are equal, round, and reactive to light.  Cardiovascular:     Rate and Rhythm: Normal rate.     Heart sounds: No murmur heard.   No gallop.  Pulmonary:     Breath sounds: No wheezing or rales.  Abdominal:     General: Abdomen is flat.     Palpations: Abdomen is soft.  Musculoskeletal:     Right lower leg: No edema.     Left lower leg: No edema.   Skin:    General: Skin is warm and dry.     Capillary Refill: Capillary refill takes less than 2 seconds.  Neurological:     General: No focal deficit present.     Mental Status: Jennifer Davila is alert and oriented to person, place, and time.     Motor: Weakness present.     Gait: Gait abnormal.  Psychiatric:        Mood and Affect: Mood normal.        Behavior: Behavior normal.        Thought Content: Thought content normal.        Judgment: Judgment normal.       Labs / Other Information My review of labs, imaging, notes and other tests shows no new significant findings.    Disposition Plan: Status is: Observation  The patient will require care spanning > 2 midnights because: After 24 hours in the hospital, the patient was too weak to stand up, collapsed just trying to transfer from the recliner to the bed.   Called to sister, no answer    [facility] 07/19/2021, 5:10 PM

## 2021-07-19 NOTE — Progress Notes (Addendum)
NT was in room assisting patient to bed, the NT turned to get the walker, the patient stood from the chair unassisted after NT instructed her to not get up by herself, patient refused direction and fell onto her knees, stating" Oh, I fell"  Patient then pulled herself up and got into bed, No distress noted, no injury noted, MD made aware, Discharge order cancelled, patient relieved that she was not going home today, she had previously stated earlier that she was not comfortable going home with Covid, because her husband has lung cancer, patient will be seen by PT again in the AM

## 2021-07-19 NOTE — Evaluation (Signed)
Physical Therapy Evaluation Patient Details Name: Jennifer Davila MRN: [id number] DOB: 05-12-1956 Today's Date: 07/19/2021  History of Present Illness  Patient is 65 y.o. female presented to [facility] with c/o bloody stool and lightheaded/weak sensation. PMH significant for OA, GERD, HLD, HTN, gastric bypass.   Clinical Impression  Jennifer Davila is 65 y.o. female admitted with above HPI and diagnosis. Patient is currently limited by functional impairments below (see PT problem list). Patient lives with her spouse and is independent at baseline. Currently she requires supervision/guarding for transfers and HHA for gait in room. Pt is mildly unsteady and reports LE's buckling previously though no sign of LOB or buckling today. Pt does ambulate in mid guard position for balance and reaches for external support, RW would benefit pt to provide stability with gait. Patient will benefit from continued skilled PT interventions to address impairments and progress independence with mobility. Anticipate no follow up therapy will be needed. Acute PT will follow and progress as able.        Recommendations for follow up therapy are one component of a multi-disciplinary discharge planning process, led by the attending physician.  Recommendations may be updated based on patient status, additional functional criteria and insurance authorization.  Follow Up Recommendations No PT follow up    Assistance Recommended at Discharge Frequent or constant Supervision/Assistance  Functional Status Assessment Patient has had a recent decline in their functional status and demonstrates the ability to make significant improvements in function in a reasonable and predictable amount of time.  Equipment Recommendations  Rolling walker (2 wheels)    Recommendations for Other Services       Precautions / Restrictions Precautions Precautions: Fall Restrictions Weight Bearing Restrictions: No      Mobility  Bed  Mobility Overal bed mobility: Modified Independent             General bed mobility comments: HOB elevated, pt taking extra time, no assist needed    Transfers Overall transfer level: Needs assistance Equipment used: None Transfers: Sit to/from Stand Sit to Stand: Min guard           General transfer comment: guarding for safety, pt able to stand without use of UE's but prefers bil UE use for power up. no assist to rise from EOB, toilet, and recliner.    Ambulation/Gait Ambulation/Gait assistance: Min assist;Min guard Gait Distance (Feet): 30 Feet Assistive device: 1 person hand held assist Gait Pattern/deviations: Step-through pattern;Decreased stride length;Wide base of support;Shuffle Gait velocity: decr     General Gait Details: min guard with pt reaching for external support, min assist for HHA to steady with gait. 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  Stairs            Wheelchair Mobility    Modified Rankin (Stroke Patients Only)       Balance Overall balance assessment: Mild deficits observed, not formally tested                                           Pertinent Vitals/Pain Pain Assessment: No/denies pain    Home Living Family/patient expects to be discharged to:: Private residence Living Arrangements: Spouse/significant other Available Help at Discharge: Family Type of Home: House Home Access: Stairs to enter Entrance Stairs-Rails: Right Entrance Stairs-Number of Steps: [place]  Layout: One level Home Equipment: None Additional Comments: pt lives with her husband and assists him if needed, he is undergoing chemo currently.    Prior Function Prior Level of Function : Independent/Modified Independent             Mobility Comments: pt takes care of her husband and works part time       Journalist, newspaper   Dominant Hand: Right    Extremity/Trunk  Assessment   Upper Extremity Assessment Upper Extremity Assessment: Overall WFL for tasks assessed    Lower Extremity Assessment Lower Extremity Assessment: Overall WFL for tasks assessed    Cervical / Trunk Assessment Cervical / Trunk Assessment: Other exceptions Cervical / Trunk Exceptions: habitus  Communication   Communication: No difficulties  Cognition Arousal/Alertness: Awake/alert Behavior During Therapy: WFL for tasks assessed/performed Overall Cognitive Status: Within Functional Limits for tasks assessed                                          General Comments      Exercises     Assessment/Plan    PT Assessment Patient needs continued PT services  PT Problem List Decreased strength;Decreased range of motion;Decreased activity tolerance;Decreased balance;Decreased mobility;Decreased knowledge of use of DME;Obesity       PT Treatment Interventions DME instruction;Gait training;Stair training;Functional mobility training;Therapeutic activities;Therapeutic exercise;Balance training;Patient/family education    PT Goals (Current goals can be found in the Care Plan section)  Acute Rehab PT Goals Patient Stated Goal: get home for Christmas PT Goal Formulation: With patient Time For Goal Achievement: 08/02/21 Potential to Achieve Goals: Good    Frequency 7X/week   Barriers to discharge        Co-evaluation               AM-PAC PT "6 Clicks" Mobility  Outcome Measure Help needed turning from your back to your side while in a flat bed without using bedrails?: None Help needed moving from lying on your back to sitting on the side of a flat bed without using bedrails?: A Little Help needed moving to and from a bed to a chair (including a wheelchair)?: A Little Help needed standing up from a chair using your arms (e.g., wheelchair or bedside chair)?: A Little Help needed to walk in hospital room?: A Little Help needed climbing 3-5 steps  with a railing? : A Lot 6 Click Score: 18    End of Session Equipment Utilized During Treatment: Gait belt Activity Tolerance: Patient tolerated treatment well Patient left: in chair;with call bell/phone within reach Nurse Communication: Mobility status PT Visit Diagnosis: Muscle weakness (generalized) (M62.81);Difficulty in walking, not elsewhere classified (R26.2)    Time: 3016-0109 PT Time Calculation (min) (ACUTE ONLY): 24 min   Charges:   PT Evaluation $PT Eval Low Complexity: 1 Low PT Treatments $Gait Training: 8-22 mins        Verner Mould, DPT Acute Rehabilitation Services Office [phone] Pager [phone]   Jennifer Davila 07/19/2021, 11:34 AM

## 2021-07-19 NOTE — Hospital Course (Addendum)
Jennifer Davila is a 65 y.o. F with hx diverticular bleed, HTN, chronic constipation who p/w 1 to 2 months of loose bowel movement with intermittent blood and then more frequent rectal bleeding in the last 24 hours with abdominal cramping.  Not on anticoagulants.  12/20: In the ER, FOBT+, Hgb 12 g/dL. Incidentally COVID+

## 2021-07-19 NOTE — Assessment & Plan Note (Addendum)
-   Start Paxlovid -Hold statin

## 2021-07-20 DIAGNOSIS — U071 COVID-19: Secondary | ICD-10-CM | POA: Diagnosis not present

## 2021-07-20 DIAGNOSIS — I10 Essential (primary) hypertension: Secondary | ICD-10-CM | POA: Diagnosis not present

## 2021-07-20 DIAGNOSIS — K922 Gastrointestinal hemorrhage, unspecified: Secondary | ICD-10-CM | POA: Diagnosis not present

## 2021-07-20 DIAGNOSIS — K219 Gastro-esophageal reflux disease without esophagitis: Secondary | ICD-10-CM

## 2021-07-20 DIAGNOSIS — E785 Hyperlipidemia, unspecified: Secondary | ICD-10-CM | POA: Diagnosis not present

## 2021-07-20 MED ORDER — ONDANSETRON HCL 4 MG PO TABS: 4.0000 mg | ORAL_TABLET | Freq: Four times a day (QID) | ORAL | 0 refills | Status: DC | PRN

## 2021-07-20 NOTE — Plan of Care (Signed)

## 2021-07-20 NOTE — Discharge Summary (Signed)
Physician Discharge Summary   Patient: Jennifer Davila MRN: [id number] DOB: @DOB   Admit date:     07/18/2021  Discharge date: 07/20/21  Discharge Physician: Edwin Dada   PCP: Martinique, [name], MD   Recommendations at discharge: 1. Dr. Martinique: Please check Hgb in 2 weeks 2. Dr. Martinique: Please re-refer patient to [facility] for evaluation of rectal bleeding 3. Dr. Martinique: Please check BP and resume antihypertensives as needed       Discharge Diagnoses Principal Problem:   COVID-19 virus infection Active Problems:   Hypertension, essential, benign   Hyperlipidemia   Leg cramping   GERD (gastroesophageal reflux disease)   Hypokalemia   GI bleed, suspected diverticulosis       Hospital Course   Jennifer Davila is a 65 y.o. F with hx diverticular bleed, HTN, chronic constipation who p/w 1 to 2 months of loose bowel movement with intermittent blood and then more frequent rectal bleeding in the last 24 hours with abdominal cramping.  Not on anticoagulants.  In the ER, FOBT+, Hgb 12 g/dL. Incidentally COVID+     * GI bleed, suspect mild recurrent diverticular bleeding Patient's bleeding stopped in the hospital.  Her Hgb remained stable at 12 g/dL.  She had some clearing clots on hospital day two but no further active bleeding.    Suspect recurrent mild diverticular bleeding.  Resolving.  Anticipatory guidance about course of diverticular bleeds given.  No need for transfusion.  Follow up with GI outpatient.  Return precautions given   Hypokalemia- (present on admission) Resolved with treatment  GERD (gastroesophageal reflux disease)    Hypertension, essential, benign- (present on admission) BP slightly elevated. Reduce amlodipine while on Paxlovid.  Hold Spironolactone while PO intake reduced due to COVID.  COVID-19 virus infection- (present on admission) Started Paxlovid.  Hold statin while on Paxlovid.  No hypoxia.  Symptoms mild.  Recommend five days  nirmatrelvir.          Disposition: Home Diet recommendation: Regular diet  DISCHARGE MEDICATION: Allergies as of 07/20/2021   No Known Allergies      Medication List     STOP taking these medications    pravastatin 20 MG tablet Commonly known as: PRAVACHOL   spironolactone 25 MG tablet Commonly known as: ALDACTONE       TAKE these medications    acetaminophen 500 MG tablet Commonly known as: TYLENOL Take 1-2 tablets (500-1,000 mg total) by mouth daily as needed for moderate pain (pain).   amLODipine 5 MG tablet Commonly known as: NORVASC Take 0.5 tablets (2.5 mg total) by mouth daily. What changed: See the new instructions.   esomeprazole 40 MG capsule Commonly known as: NEXIUM TAKE 1 CAPSULE BY MOUTH EVERY DAY What changed: how much to take   ibuprofen 400 MG tablet Commonly known as: ADVIL TAKE 1 TABLET (400 MG TOTAL) BY MOUTH DAILY AS NEEDED. What changed: reasons to take this   loperamide 2 MG capsule Commonly known as: IMODIUM Take 1 capsule (2 mg total) by mouth 4 (four) times daily as needed for diarrhea or loose stools.   losartan 100 MG tablet Commonly known as: COZAAR Take 1 tablet (100 mg total) by mouth daily.   nirmatrelvir/ritonavir EUA 20 x 150 MG & 10 x 100MG  Tabs Commonly known as: PAXLOVID Take 3 tablets by mouth 2 (two) times daily for 5 days   ondansetron 4 MG tablet Commonly known as: ZOFRAN Take 1 tablet (4 mg total) by mouth every 6 (six)  hours as needed for nausea or vomiting.   potassium chloride 10 MEQ tablet Commonly known as: KLOR-CON TAKE 1 TABLET BY MOUTH EVERY DAY   ZINC PO Take 1 tablet by mouth daily.        Follow-up Information     Martinique, [name], MD. Schedule an appointment as soon as possible for a visit in 2 week(s).   Specialty: Family Medicine Contact information: [place] [address] [phone]                Discharge Instructions     Discharge  instructions   Complete by: As directed    You were admitted for coronavirus (Also known as COVID-19)  Your chest x-ray was normal and your oxygen levels are normal, we are relieved you do not have significant COVID pneumonia.    Generally, fatigue, aches, and low appetite are the symptoms that bother people with COVID the most.  You were started on the anti-virus medicine Paxlovid, and you should complete the 5 day course (you will finish it on 12/25 in the evening)   IMPORTANT: While you are taking Paxlovid -- do not take your pravastatin AND split your amlodipine in half (take amlodipine/Norvasc 2.5 mg daily) Also, I recommend for the next 1-2 weeks (until you see Dr. Martinique), do not take your spironolactone  When you see Dr. Martinique in 2 weeks, you can resume all of those medicines Your blood pressure may run slightly high in that time, that's okay.  If it does, you may increase your amlodipine back to your normal dose.  Drink plenty of fluids  HOW LONG TO REMAIN IN QUARANTINE: There is no absolutely correct answer to this and so our best answer is to be on the cautious side.  Based on what we know of the virus, you should isolate for 10 days from your first symptoms.  Until you end your quarantine: Have your husband tested for COVID this week Avoid being in the same room with your husband If you must be in the same room or in the car together, make sure you wear a mask and have him wear a mask  Clean all hard surfaces (counters, doors, tables) twice a day Use separate bathrooms if you can Use separate bedroom if you can   Lastly, for the bleeding, thankfully, this episode seems much better than in 2019.  There may be a little more residual clearing, but you should return for large repeated red bloody bowel movements or feeling dizzy with standing.  Otherwise, go see Dr. Martinique in 2 weeks, and follow up with Dr. Havery Moros and the [facility] office as needed    Increase activity slowly   Complete by: As directed    No wound care   Complete by: As directed          Discharge Exam: Filed Weights   07/18/21 1314  Weight: 112.5 kg   General appearance: Adult female, sitting up in recliner, no acute distress, feels well.     Cardiac: RRR, no murmurs, no lower extremity edema. Respiratory: Normal respiratory rate and rhythm, lungs clear without rales or wheezes Abdomen: Abdomen soft nontender palpation or guarding, no ascites or distention   Condition at discharge: good  The results of significant diagnostics from this hospitalization (including imaging, microbiology, ancillary and laboratory) are listed below for reference.   Imaging Studies: DG Abd 2 Views  Result Date: 07/18/2021 CLINICAL DATA:  Rectal bleeding. EXAM: ABDOMEN - 2  VIEW COMPARISON:  03/30/2016. FINDINGS: Examination is limited due to patient's body habitus. The bowel gas pattern is normal. Surgical clips are present in the right upper quadrant and pelvis on the left. There is no evidence of free air. No unusual calcification. No acute osseous abnormality. IMPRESSION: Nonobstructive bowel-gas pattern. Electronically Signed   By: Brett Fairy M.D.   On: 07/18/2021 21:20    Microbiology: Results for orders placed or performed during the hospital encounter of 07/18/21  Resp Panel by RT-PCR (Flu A&B, Covid) Nasopharyngeal Swab     Status: Abnormal   Collection Time: 07/18/21  5:03 PM   Specimen: Nasopharyngeal Swab; Nasopharyngeal(NP) swabs in vial transport medium  Result Value Ref Range Status   SARS Coronavirus 2 by RT PCR POSITIVE (A) NEGATIVE Final    Comment: (NOTE) SARS-CoV-2 target nucleic acids are DETECTED.  The SARS-CoV-2 RNA is generally detectable in upper respiratory specimens during the acute phase of infection. Positive results are indicative of the presence of the identified virus, but do not rule out bacterial infection or co-infection with other  pathogens not detected by the test. Clinical correlation with patient history and other diagnostic information is necessary to determine patient infection status. The expected result is Negative.  Fact Sheet for Patients: EntrepreneurPulse.com.au  Fact Sheet for Healthcare Providers: IncredibleEmployment.be  This test is not yet approved or cleared by the [place] FDA and  has been authorized for detection and/or diagnosis of SARS-CoV-2 by FDA under an Emergency Use Authorization (EUA).  This EUA will remain in effect (meaning this test can be used) for the duration of  the COVID-19 declaration under Section 564(b)(1) of the A ct, 21 U.S.C. section 360bbb-3(b)(1), unless the authorization is terminated or revoked sooner.     Influenza A by PCR NEGATIVE NEGATIVE Final   Influenza B by PCR NEGATIVE NEGATIVE Final    Comment: (NOTE) The Xpert Xpress SARS-CoV-2/FLU/RSV plus assay is intended as an aid in the diagnosis of influenza from Nasopharyngeal swab specimens and should not be used as a sole basis for treatment. Nasal washings and aspirates are unacceptable for Xpert Xpress SARS-CoV-2/FLU/RSV testing.  Fact Sheet for Patients: EntrepreneurPulse.com.au  Fact Sheet for Healthcare Providers: IncredibleEmployment.be  This test is not yet approved or cleared by the [place] FDA and has been authorized for detection and/or diagnosis of SARS-CoV-2 by FDA under an Emergency Use Authorization (EUA). This EUA will remain in effect (meaning this test can be used) for the duration of the COVID-19 declaration under Section 564(b)(1) of the Act, 21 U.S.C. section 360bbb-3(b)(1), unless the authorization is terminated or revoked.  Performed at [facility], [place] [address] [place],  [address]     Labs: CBC: Recent Labs  Lab 07/18/21 1357 07/18/21 2318 07/19/21 0436  WBC  6.8 6.7 7.8  NEUTROABS 4.5  --   --   HGB 12.1 11.7* 12.1  HCT 39.9 37.9 39.1  MCV 75.0* 75.3* 74.6*  PLT 359 325 094   Basic Metabolic Panel: Recent Labs  Lab 07/18/21 1357 07/19/21 0436  NA 139 137  K 3.9 3.5  CL 104 104  CO2 27 24  GLUCOSE 129* 100*  BUN 14 15  CREATININE 0.92 1.02*  CALCIUM 9.0 9.1   Liver Function Tests: Recent Labs  Lab 07/18/21 1357 07/19/21 0436  AST 15 15  ALT 14 14  ALKPHOS 99 93  BILITOT 0.7 0.9  PROT 7.9 7.8  ALBUMIN 4.2 4.1   CBG: No results for input(s):  GLUCAP in the last 168 hours.  Discharge time spent: greater than 30 minutes.  Signed:  Edwin Dada MD.  [facility] 07/20/2021

## 2021-07-20 NOTE — Progress Notes (Signed)
Assessment unchanged. DC instructions given by Eloy End, RN with verbalized understanding about medications, follow up care and when to call the doctor. DC'd via wc to [facility] center entrance to meet ride to carry home. Accompanied by NT x 2.

## 2021-07-25 ENCOUNTER — Telehealth: Payer: Self-pay

## 2021-07-25 NOTE — Telephone Encounter (Signed)
Transition Care Management Follow-up Telephone Call Date of discharge and from where: Jennifer Davila 07-20-21 GI Bleed  How have you been since you were released from the hospital? Weak but better  Any questions or concerns? No  Items Reviewed: Did the pt receive and understand the discharge instructions provided? Yes  Medications obtained and verified? Yes  Other? No  Any new allergies since your discharge? No  Dietary orders reviewed? Yes Do you have support at home? Yes   Home Care and Equipment/Supplies: Were home health services ordered? no If so, what is the name of the agency? [facility]   Has the agency set up a time to come to the patient's home? not applicable Were any new equipment or medical supplies ordered?  No What is the name of the medical supply agency? na Were you able to get the supplies/equipment? not applicable Do you have any questions related to the use of the equipment or supplies? No  Functional Questionnaire: (I = Independent and D = Dependent) ADLs: I  Bathing/Dressing- I  Meal Prep- I  Eating- I  Maintaining continence- I  Transferring/Ambulation- I  Managing Meds- I  Follow up appointments reviewed:  PCP Hospital f/u appt confirmed? Yes  Scheduled to see Dr Martinique on 08-01-21 @ [facility] f/u appt confirmed? No  . Are transportation arrangements needed? No  If their condition worsens, is the pt aware to call PCP or go to the Emergency Dept.? Yes Was the patient provided with contact information for the PCP's office or ED? Yes Was to pt encouraged to call back with questions or concerns? Yes

## 2021-07-27 ENCOUNTER — Other Ambulatory Visit (HOSPITAL_COMMUNITY): Payer: Self-pay

## 2021-08-01 ENCOUNTER — Encounter: Payer: Self-pay | Admitting: Family Medicine

## 2021-08-01 ENCOUNTER — Ambulatory Visit (INDEPENDENT_AMBULATORY_CARE_PROVIDER_SITE_OTHER): Payer: Medicare HMO | Admitting: Family Medicine

## 2021-08-01 VITALS — BP 136/80 | HR 90 | Temp 98.7°F | Resp 16 | Ht 61.0 in | Wt 244.0 lb

## 2021-08-01 DIAGNOSIS — R200 Anesthesia of skin: Secondary | ICD-10-CM | POA: Diagnosis not present

## 2021-08-01 DIAGNOSIS — I10 Essential (primary) hypertension: Secondary | ICD-10-CM

## 2021-08-01 DIAGNOSIS — K922 Gastrointestinal hemorrhage, unspecified: Secondary | ICD-10-CM

## 2021-08-01 DIAGNOSIS — M79604 Pain in right leg: Secondary | ICD-10-CM

## 2021-08-01 DIAGNOSIS — E785 Hyperlipidemia, unspecified: Secondary | ICD-10-CM

## 2021-08-01 DIAGNOSIS — E876 Hypokalemia: Secondary | ICD-10-CM | POA: Diagnosis not present

## 2021-08-01 DIAGNOSIS — M79605 Pain in left leg: Secondary | ICD-10-CM | POA: Diagnosis not present

## 2021-08-01 LAB — BASIC METABOLIC PANEL
BUN: 13 mg/dL (ref 6–23)
CO2: 30 mEq/L (ref 19–32)
Calcium: 9.1 mg/dL (ref 8.4–10.5)
Chloride: 101 mEq/L (ref 96–112)
Creatinine, Ser: 0.95 mg/dL (ref 0.40–1.20)
GFR: 61.88 mL/min (ref >60.00)
Glucose, Bld: 100 mg/dL — ABNORMAL HIGH (ref 70–99)
Potassium: 3.5 mEq/L (ref 3.5–5.1)
Sodium: 139 mEq/L (ref 135–145)

## 2021-08-01 LAB — CBC
HCT: 35.2 % — ABNORMAL LOW (ref 36.0–46.0)
Hemoglobin: 11 g/dL — ABNORMAL LOW (ref 12.0–15.0)
MCHC: 31.3 g/dL (ref 30.0–36.0)
MCV: 73.7 fl — ABNORMAL LOW (ref 78.0–100.0)
Platelets: 308 10*3/uL (ref 150.0–400.0)
RBC: 4.78 Mil/uL (ref 3.87–5.11)
RDW: 13.9 % (ref 11.5–15.5)
WBC: 6.4 10*3/uL (ref 4.0–10.5)

## 2021-08-01 MED ORDER — GABAPENTIN 100 MG PO CAPS: 100.0000 mg | ORAL_CAPSULE | Freq: Every day | ORAL | 2 refills | Status: DC

## 2021-08-01 NOTE — Assessment & Plan Note (Signed)
Recent episodes has resolved. We discussed some side effects of NSAID's. Prefers to hold on GI follow up. Clearly instructed about warning signs.

## 2021-08-01 NOTE — Patient Instructions (Addendum)
A few things to remember from today's visit:  Hypertension, essential, benign - Plan: Basic metabolic panel  Lower GI bleed - Plan: CBC  Hypokalemia  Numbness in feet  If you need refills please call your pharmacy. Do not use My Chart to request refills or for acute issues that need immediate attention.   Resume Spironolactone and Pravastatin. No changes in rest.  Please be sure medication list is accurate. If a new problem present, please set up appointment sooner than planned today.  We need to be careful with Ibuprofen. Gabapentin added today to help with feet pain and numbness, stake it at bedtime.

## 2021-08-01 NOTE — Assessment & Plan Note (Addendum)
She agrees with trying Gabapentin again, 100 mg at bedtime. For now we will hold on Zanaflex, which she did not feel like helping much. She takes Ibuprofen just when she goes to church and it helps with pain, she would like to continue it.

## 2021-08-01 NOTE — Assessment & Plan Note (Signed)
Instructed to resume Pravastatin 20 mg daily. Continue low fat diet.

## 2021-08-01 NOTE — Assessment & Plan Note (Signed)
BP today adequately controlled. Recommend resuming Spironolactone 25 mg. No changes in Losartan or Amlodipine dose. Monitoring BP at home recommended. Low salt diet to continue.

## 2021-08-01 NOTE — Assessment & Plan Note (Signed)
Continue KLOR 10 meq daily. Further recommendations according to BMP result.

## 2021-08-01 NOTE — Progress Notes (Addendum)
HPI: Jennifer Davila is a 66 y.o. female, who is here today to follow on recent hospitalization. TCM on 07/25/21. Hospitalized from 07/18/21 to 07/20/21 because of rectal bleeding. She presented to the ED with 1-2 months of loose stools and intermittent rectal bleed + abdominal cramps. ED evaluation: FOBT+, Hgb 12 g/dL. Incidentally COVID+ She had been in the ED on 07/11/21 for respiratory symptoms, according to pt, viral testing was not done.  She was discharged on loperamide and ondansetron.  H/H remained stable during hospitalization, no active GI bleeding since then. Diverticular bleeding. GI evaluation was recommended. Colonoscopy on 01/08/2018. Last bowel movement yesterday. Denies blood in the stool or melena. Negative for abdominal pain, nausea, vomiting, or urinary symptoms.  Lab Results  Component Value Date   WBC 7.8 07/19/2021   HGB 12.1 07/19/2021   HCT 39.1 07/19/2021   MCV 74.6 (L) 07/19/2021   PLT 364 07/19/2021   COVID 19 test positive, she was treated with Paxlovid. Pravastatin was held while on antiviral med, has not started med.  HTN:  Spironolactone was held during hospitalization. Amlodipine dose decreased from 5 mg to 2.5 mg. Losartan 100 mg daily. She is not checking BP at home.  Negative for severe/frequent headache, visual changes, chest pain, dyspnea, palpitation, focal weakness, or worsening edema.  HypoK+ on KLOR 10 meq daily.  Lab Results  Component Value Date   CREATININE 1.02 (H) 07/19/2021   BUN 15 07/19/2021   NA 137 07/19/2021   K 3.5 07/19/2021   CL 104 07/19/2021   CO2 24 07/19/2021   Requesting refills on Zanaflex, which she feels helps more with sleep more than musculoskeletal/leg pain. Pain interferes with sleep.  Bilateral foot pain, mainly at night. She wonders if she needs to see podiatrist. Problem has been going on for a while, same than LE's pain. She has not noted erythema or worsening LE edema.  Achy and  sometimes toes numbness. Alleviated by walking. She has been evaluated by orthopedist, vascular, and rheumatologist. Hydrocodone-acetaminophen did not help with pain. She also try duloxetine and gabapentin.  Review of Systems  Constitutional:  Positive for fatigue. Negative for appetite change, chills and fever.  HENT:  Negative for mouth sores, nosebleeds and sore throat.   Respiratory:  Negative for cough and wheezing.   Genitourinary:  Negative for decreased urine volume, dysuria and hematuria.  Musculoskeletal:  Negative for gait problem.  Neurological:  Negative for syncope and facial asymmetry.  Psychiatric/Behavioral:  Positive for sleep disturbance. The patient is nervous/anxious.   Rest see pertinent positives and negatives per HPI.  Current Outpatient Medications on File Prior to Visit  Medication Sig Dispense Refill   acetaminophen (TYLENOL) 500 MG tablet Take 1-2 tablets (500-1,000 mg total) by mouth daily as needed for moderate pain (pain). 60 tablet 1   amLODipine (NORVASC) 5 MG tablet Take 0.5 tablets (2.5 mg total) by mouth daily. 90 tablet 1   esomeprazole (NEXIUM) 40 MG capsule TAKE 1 CAPSULE BY MOUTH EVERY DAY (Patient taking differently: Take 40 mg by mouth daily.) 90 capsule 3   ibuprofen (ADVIL) 400 MG tablet TAKE 1 TABLET (400 MG TOTAL) BY MOUTH DAILY AS NEEDED. (Patient taking differently: Take 400 mg by mouth daily as needed for fever, headache or mild pain.) 30 tablet 1   loperamide (IMODIUM) 2 MG capsule Take 1 capsule (2 mg total) by mouth 4 (four) times daily as needed for diarrhea or loose stools. 12 capsule 0   losartan (COZAAR) 100 MG  tablet Take 1 tablet (100 mg total) by mouth daily. 90 tablet 2   Multiple Vitamins-Minerals (ZINC PO) Take 1 tablet by mouth daily.     ondansetron (ZOFRAN) 4 MG tablet Take 1 tablet (4 mg total) by mouth every 6 (six) hours as needed for nausea or vomiting. 20 tablet 0   potassium chloride (KLOR-CON) 10 MEQ tablet TAKE 1  TABLET BY MOUTH EVERY DAY (Patient taking differently: Take 10 mEq by mouth daily.) 90 tablet 2   No current facility-administered medications on file prior to visit.    Past Medical History:  Diagnosis Date   Arthritis    Constipation    on stool softener   GERD (gastroesophageal reflux disease)    Hyperlipidemia    Hypertension    Trouble swallowing 2013   following lap band   Urticaria    No Known Allergies  Social History   Socioeconomic History   Marital status: Married    Spouse name: Not on file   Number of children: 1   Years of education: Not on file   Highest education level: Not on file  Occupational History   Not on file  Tobacco Use   Smoking status: Former    Types: Cigarettes    Quit date: 10/02/1972    Years since quitting: 48.8   Smokeless tobacco: Never  Vaping Use   Vaping Use: Never used  Substance and Sexual Activity   Alcohol use: No   Drug use: No   Sexual activity: Never  Other Topics Concern   Not on file  Social History Narrative   Not on file   Social Determinants of Health   Financial Resource Strain: Medium Risk   Difficulty of Paying Living Expenses: Somewhat hard  Food Insecurity: Not on file  Transportation Needs: No Transportation Needs   Lack of Transportation (Medical): No   Lack of Transportation (Non-Medical): No  Physical Activity: Insufficiently Active   Days of Exercise per Week: 2 days   Minutes of Exercise per Session: 30 min  Stress: Not on file  Social Connections: Socially Integrated   Frequency of Communication with Friends and Family: Twice a week   Frequency of Social Gatherings with Friends and Family: Twice a week   Attends Religious Services: More than 4 times per year   Active Member of Clubs or Organizations: Yes   Attends Archivist Meetings: 1 to 4 times per year   Marital Status: Married   Vitals:   08/01/21 1045 08/01/21 1147  BP: 136/80   Pulse: (!) 102 90  Resp: 16   Temp: 98.7 F  (37.1 C)   SpO2: 98%    Body mass index is 46.1 kg/m.  Physical Exam Vitals and nursing note reviewed.  Constitutional:      General: She is not in acute distress.    Appearance: She is well-developed.  HENT:     Head: Normocephalic and atraumatic.     Mouth/Throat:     Mouth: Mucous membranes are moist.     Dentition: Abnormal dentition (loose tooth.).     Pharynx: Oropharynx is clear.  Eyes:     Conjunctiva/sclera: Conjunctivae normal.  Cardiovascular:     Rate and Rhythm: Normal rate and regular rhythm.     Pulses:          Dorsalis pedis pulses are 2+ on the right side and 2+ on the left side.     Heart sounds: No murmur heard. Pulmonary:  Effort: Pulmonary effort is normal. No respiratory distress.     Breath sounds: Normal breath sounds.  Abdominal:     Palpations: Abdomen is soft. There is no hepatomegaly or mass.     Tenderness: There is no abdominal tenderness.  Lymphadenopathy:     Cervical: No cervical adenopathy.  Skin:    General: Skin is warm.     Findings: No erythema or rash.  Neurological:     General: No focal deficit present.     Mental Status: She is alert and oriented to person, place, and time.     Cranial Nerves: No cranial nerve deficit.     Gait: Gait normal.  Psychiatric:     Comments: Well groomed, good eye contact.   ASSESSMENT AND PLAN:  Ms.Shifa was seen today for hospitalization follow-up.  Diagnoses and all orders for this visit: Orders Placed This Encounter  Procedures   Basic metabolic panel   CBC   Lab Results  Component Value Date   CREATININE 0.95 08/01/2021   BUN 13 08/01/2021   NA 139 08/01/2021   K 3.5 08/01/2021   CL 101 08/01/2021   CO2 30 08/01/2021   Lab Results  Component Value Date   WBC 6.4 08/01/2021   HGB 11.0 (L) 08/01/2021   HCT 35.2 (L) 08/01/2021   MCV 73.7 (L) 08/01/2021   PLT 308.0 08/01/2021   Numbness in feet Chronic and stable. Normal monofilament test, bilateral. Appropriate  foot care. Gabapentin 100 mg at bedtime added today.  -     gabapentin (NEURONTIN) 100 MG capsule; Take 1 capsule (100 mg total) by mouth at bedtime.  Hypertension, essential, benign BP today adequately controlled. Recommend resuming Spironolactone 25 mg. No changes in Losartan or Amlodipine dose. Monitoring BP at home recommended. Low salt diet to continue.  Hypokalemia Continue KLOR 10 meq daily. Further recommendations according to BMP result.  Hyperlipidemia Instructed to resume Pravastatin 20 mg daily. Continue low fat diet.  Bilateral leg pain She agrees with trying Gabapentin again, 100 mg at bedtime. For now we will hold on Zanaflex, which she did not feel like helping much. She takes Ibuprofen just when she goes to church and it helps with pain, she would like to continue it.  GI bleed Recent episodes has resolved. We discussed some side effects of NSAID's. Prefers to hold on GI follow up. Clearly instructed about warning signs.  Strongly recommend arranging appointment with her dentist.  Return in about 3 months (around 10/30/2021), or Keep appt.  Annitta Fifield G. Martinique, MD  [facility]. [place] office.

## 2021-08-02 ENCOUNTER — Telehealth: Payer: Self-pay | Admitting: Family Medicine

## 2021-08-02 NOTE — Telephone Encounter (Signed)
Pt is calling and would like blood work results 

## 2021-08-02 NOTE — Telephone Encounter (Signed)
I will be in touch with patient after provider reviews results.

## 2021-08-10 ENCOUNTER — Telehealth: Payer: Self-pay | Admitting: Pharmacist

## 2021-08-10 NOTE — Chronic Care Management (AMB) (Signed)
Chronic Care Management Pharmacy Assistant   Name: Jennifer Davila  MRN: [id number] DOB: 1956/07/24  Reason for Encounter: Disease State / Hypertension Assessment Call   Conditions to be addressed/monitored: HLD   Recent office visits:  08/01/2021 [name] MD - Patient was seen for hypertension and additional issues. Started Gabapentin 100 mg daily. No follow up noted.  07/11/2021 Inda Coke PA - Patient was seen for [facility] and fatigue. No medication changes. No follow up needed.  Recent consult visits:  None  Hospital visits:  Admitted to [facility] 07/18/2021 due to GI bleed. Discharge date was 07/20/2021.  New?Medications Started at [facility] Discharge:?? nirmatrelvir/ritonavir EUA (PAXLOVID) Medication Changes at Hospital Discharge: amLODipine (NORVASC) Medications Discontinued at Hospital Discharge: pravastatin 20 MG tablet (PRAVACHOL) spironolactone 25 MG tablet (ALDACTONE) Medications that remain the same after Hospital Discharge:??  -All other medications will remain the same.     Patient was seen at [facility] on 07/11/2021 due to Influenza like illness. Discharge after 37 minutes. New?Medications Started at [facility] Discharge:?? Loperamide Take 1 capsule (2 mg total) by mouth 4 (four) times daily as needed for diarrhea or loose stools Ondansetron Take 1 tablet (4 mg total) by mouth every 6 (six) hours Medication Changes at Hospital Discharge: -No medication changes Medications Discontinued at Hospital Discharge: -No medication changes Medications that remain the same after Hospital Discharge:??  -All other medications will remain the same.     Medications: Outpatient Encounter Medications as of 08/10/2021  Medication Sig   acetaminophen (TYLENOL) 500 MG tablet Take 1-2 tablets (500-1,000 mg total) by mouth daily as needed for moderate pain (pain).   amLODipine (NORVASC) 5 MG tablet Take 0.5 tablets (2.5 mg  total) by mouth daily.   esomeprazole (NEXIUM) 40 MG capsule TAKE 1 CAPSULE BY MOUTH EVERY DAY (Patient taking differently: Take 40 mg by mouth daily.)   gabapentin (NEURONTIN) 100 MG capsule Take 1 capsule (100 mg total) by mouth at bedtime.   ibuprofen (ADVIL) 400 MG tablet TAKE 1 TABLET (400 MG TOTAL) BY MOUTH DAILY AS NEEDED. (Patient taking differently: Take 400 mg by mouth daily as needed for fever, headache or mild pain.)   loperamide (IMODIUM) 2 MG capsule Take 1 capsule (2 mg total) by mouth 4 (four) times daily as needed for diarrhea or loose stools.   losartan (COZAAR) 100 MG tablet Take 1 tablet (100 mg total) by mouth daily.   Multiple Vitamins-Minerals (ZINC PO) Take 1 tablet by mouth daily.   ondansetron (ZOFRAN) 4 MG tablet Take 1 tablet (4 mg total) by mouth every 6 (six) hours as needed for nausea or vomiting.   potassium chloride (KLOR-CON) 10 MEQ tablet TAKE 1 TABLET BY MOUTH EVERY DAY (Patient taking differently: Take 10 mEq by mouth daily.)   No facility-administered encounter medications on file as of 08/10/2021.  Fill History: LOSARTAN POTASSIUM 100 MG TAB 07/03/2021 90   PRAVASTATIN SODIUM 20 MG TAB 06/22/2021 90   ESOMEPRAZOLE MAG DR 40 MG CAP 06/22/2021 90   GABAPENTIN 100 MG CAPSULE 08/03/2021 30   ibuprofen 400 mg tablet 07/14/2021 30   SPIRONOLACTONE 25 MG TABLET 06/22/2021 90   amLODIpine (NORVASC) tablet 03/31/2021 90   Reviewed chart prior to disease state call. Spoke with patient regarding BP  Recent Office Vitals: BP Readings from Last 3 Encounters:  08/01/21 136/80  07/20/21 (!) 168/86  07/11/21 (!) 154/93   Pulse Readings from Last 3 Encounters:  08/01/21 90  07/20/21 71  07/11/21 83    Wt Readings from Last 3 Encounters:  08/01/21 244 lb (110.7 kg)  07/18/21 248 lb (112.5 kg)  07/11/21 248 lb (112.5 kg)     Kidney Function Lab Results  Component Value Date/Time   CREATININE 0.95 08/01/2021 11:31 AM   CREATININE 1.02 (H) 07/19/2021  04:36 AM   CREATININE 1.03 (H) 05/03/2016 11:52 AM   CREATININE 0.93 10/31/2015 10:30 AM   GFR 61.88 08/01/2021 11:31 AM   GFRNONAA >60 07/19/2021 04:36 AM   GFRNONAA 58 (L) 05/03/2016 11:52 AM   GFRAA 68 04/19/2020 10:51 AM   GFRAA 67 05/03/2016 11:52 AM    BMP Latest Ref Rng & Units 08/01/2021 07/19/2021 07/18/2021  Glucose 70 - 99 mg/dL 100(H) 100(H) 129(H)  BUN 6 - 23 mg/dL 13 15 14   Creatinine 0.40 - 1.20 mg/dL 0.95 1.02(H) 0.92  BUN/Creat Ratio 12 - 28 - - -  Sodium 135 - 145 mEq/L 139 137 139  Potassium 3.5 - 5.1 mEq/L 3.5 3.5 3.9  Chloride 96 - 112 mEq/L 101 104 104  CO2 19 - 32 mEq/L 30 24 27   Calcium 8.4 - 10.5 mg/dL 9.1 9.1 9.0    Current antihypertensive regimen:  Amlodipine 5 mg 1/2 tablet daily Losartan 100 mg daily  How often are you checking your Blood Pressure? Patient has not been checking blood pressures. She has been taking her husband to a lot of doctor appointments. She states she will start taking them again and logging the readings.   Current home BP readings: Patient has not been checking  What recent interventions/DTPs have been made by any provider to improve Blood Pressure control since last CPP Visit: No recent interventions.   Any recent hospitalizations or ED visits since last visit with CPP? Patient had a recent admission to [facility] on 07/18/2021 for a GI bleed.   What diet changes have been made to improve Blood Pressure Control?  Patient is not following any specific diet. Breakfast - she will have toast. Lunch - she does not eat lunch Dinner - she will have a meal that contains meat and vegetables with a salad  What exercise is being done to improve your Blood Pressure Control?  Patient states she does a lot of walking around the house and housework.   Adherence Review: Is the patient currently on ACE/ARB medication? Yes Does the patient have >5 day gap between last estimated fill dates? No  Care Gaps: AWV - Completed  12/19/2020 Zoster vaccine - never done Covid 19 booster 3 - overdue 07/17/2020 Last BP - 136/80 on 08/01/2021  Star Rating Drugs: Losartan 100mg  - last filled 07/03/2021 90DS  at [facility] Pravastatin 20mg  - last filled 06/22/2021 90DS at [facility] Pharmacist Assistant [phone]

## 2021-09-26 ENCOUNTER — Other Ambulatory Visit: Payer: Self-pay | Admitting: Family Medicine

## 2021-09-26 DIAGNOSIS — E785 Hyperlipidemia, unspecified: Secondary | ICD-10-CM

## 2021-10-31 NOTE — Progress Notes (Signed)
? ?HPI: ?[name] is a 66 y.o. female, who is here today for her routine physical. ? ?Last CPE: 2021 ? ?Regular exercise: Not consistently but she is active at home and when she works. ?Following a healthful diet: Cooking at home: "Fat stuff." Portions are smaller and less frequent. ? ?Chronic medical problems: HLD,GERD,Vit D def, iron def anemia,chronic pain,prediabetes,and HTN among some. ? ?Immunization History  ?Administered Date(s) Administered  ? Fluad Quad(high Dose 65+) 06/15/2020, 05/30/2021  ? Hepatitis B, adult 10/13/2019, 11/17/2019, 05/18/2020  ? Influenza, High Dose Seasonal PF 04/29/2019  ? Influenza,inj,Quad PF,6+ Mos 05/23/2015, 05/03/2016, 04/23/2018  ? Moderna Sars-Covid-2 Vaccination 01/08/2020, 02/05/2020  ? PPD Test 05/14/2018  ? Pneumococcal Conjugate-13 08/04/2015  ? Pneumococcal Polysaccharide-23 08/26/2019  ? Tdap 05/23/2015  ? ?Health Maintenance  ?Topic Date Due  ? COVID-19 Vaccine (3 - Booster for Moderna series) 11/16/2021 (Originally 04/01/2020)  ? Zoster Vaccines- Shingrix (1 of 2) 08/03/2022 (Originally 10/25/2003)  ? MAMMOGRAM  11/28/2021  ? INFLUENZA VACCINE  02/27/2022  ? COLONOSCOPY (Pts 45-67yr Insurance coverage will need to be confirmed)  01/09/2023  ? TETANUS/TDAP  05/22/2025  ? Pneumonia Vaccine 68 Years old  Completed  ? DEXA SCAN  Completed  ? Hepatitis C Screening  Completed  ? HPV VACCINES  Aged Out  ? ?Pap smear: S/P hysterectomy. ?Hx of abnormal pap smears: Negative. ? ?She has some concerns today. ? ?HLD: Currently she is on Pravastatin 20 mg daily. ?Lab Results  ?Component Value Date  ? CHOL 185 09/21/2020  ? HDL 41.90 09/21/2020  ? LDLCALC 110 (H) 09/21/2020  ? TRIG 167.0 (H) 09/21/2020  ? CHOLHDL 4 09/21/2020  ? ?HTN on Losartan 100 mg daily, Amlodipine 5 mg daily,and Spironolactone 25 mg daily. ?HypoK+ on KLOR 10 meq daily. ?Lab Results  ?Component Value Date  ? CREATININE 0.95 08/01/2021  ? BUN 13 08/01/2021  ? NA 139 08/01/2021  ? K 3.5 08/01/2021   ? CL 101 08/01/2021  ? CO2 30 08/01/2021  ? ?Prediabetes: Negative for polydipsia,polyuria, or polyphagia. ? ?Lab Results  ?Component Value Date  ? HGBA1C 6.2 05/30/2021  ? ?Facial bumps and pruritus a day after using a new night cream Oley cream 4 days ago.She has discontinued cream. ?Stable. ?No new medications, sick contact,or outdoors exposure. ?She has not used OTC medications. ? ?Also c/o not being able to extend fingers and severe pain, right thumb,4th,and 5th. ?Problem has been going on for a month. ?Numbness of fingertips at night, improved with movement. She has not noted joint erythema or edema. ?No hx of trauma. ?Right handed. ? ?Vit D deficiency: She is not on vit D supplementation. ?Last 25 OH vit D was 38 in 01/2020. ? ?Review of Systems  ?Constitutional:  Positive for fatigue. Negative for appetite change and fever.  ?HENT:  Negative for hearing loss, mouth sores, sore throat, trouble swallowing and voice change.   ?Eyes:  Negative for redness and visual disturbance.  ?Respiratory:  Negative for cough, shortness of breath and wheezing.   ?Cardiovascular:  Positive for leg swelling (Chronic and stable.). Negative for chest pain.  ?Gastrointestinal:  Negative for abdominal pain, nausea and vomiting.  ?     No changes in bowel habits.  ?Endocrine: Negative for cold intolerance and heat intolerance.  ?Genitourinary:  Negative for decreased urine volume, dysuria, hematuria, vaginal bleeding and vaginal discharge.  ?Musculoskeletal:  Positive for arthralgias and back pain. Negative for gait problem.  ?Skin:  Positive for rash. Negative for color change.  ?  Allergic/Immunologic: Negative for environmental allergies.  ?Neurological:  Negative for syncope, weakness and headaches.  ?Hematological:  Negative for adenopathy. Does not bruise/bleed easily.  ?Psychiatric/Behavioral:  Negative for confusion. The patient is nervous/anxious.   ?All other systems reviewed and are negative. ? ?Current Outpatient  Medications on File Prior to Visit  ?Medication Sig Dispense Refill  ? acetaminophen (TYLENOL) 500 MG tablet Take 1-2 tablets (500-1,000 mg total) by mouth daily as needed for moderate pain (pain). 60 tablet 1  ? amLODipine (NORVASC) 5 MG tablet Take 0.5 tablets (2.5 mg total) by mouth daily. 90 tablet 1  ? esomeprazole (NEXIUM) 40 MG capsule TAKE 1 CAPSULE BY MOUTH EVERY DAY (Patient taking differently: Take 40 mg by mouth daily.) 90 capsule 3  ? gabapentin (NEURONTIN) 100 MG capsule Take 1 capsule (100 mg total) by mouth at bedtime. 30 capsule 2  ? ibuprofen (ADVIL) 400 MG tablet TAKE 1 TABLET (400 MG TOTAL) BY MOUTH DAILY AS NEEDED. (Patient taking differently: Take 400 mg by mouth daily as needed for fever, headache or mild pain.) 30 tablet 1  ? loperamide (IMODIUM) 2 MG capsule Take 1 capsule (2 mg total) by mouth 4 (four) times daily as needed for diarrhea or loose stools. 12 capsule 0  ? losartan (COZAAR) 100 MG tablet Take 1 tablet (100 mg total) by mouth daily. 90 tablet 2  ? Multiple Vitamins-Minerals (ZINC PO) Take 1 tablet by mouth daily.    ? ondansetron (ZOFRAN) 4 MG tablet Take 1 tablet (4 mg total) by mouth every 6 (six) hours as needed for nausea or vomiting. 20 tablet 0  ? potassium chloride (KLOR-CON) 10 MEQ tablet TAKE 1 TABLET BY MOUTH EVERY DAY (Patient taking differently: Take 10 mEq by mouth daily.) 90 tablet 2  ? pravastatin (PRAVACHOL) 20 MG tablet TAKE 1 TABLET BY MOUTH EVERY DAY 90 tablet 3  ? spironolactone (ALDACTONE) 25 MG tablet TAKE 1 TABLET (25 MG TOTAL) BY MOUTH DAILY. 90 tablet 1  ? ?No current facility-administered medications on file prior to visit.  ? ?Past Medical History:  ?Diagnosis Date  ? Arthritis   ? Constipation   ? on stool softener  ? GERD (gastroesophageal reflux disease)   ? Hyperlipidemia   ? Hypertension   ? Trouble swallowing 2013  ? following lap band  ? Urticaria   ? ?Past Surgical History:  ?Procedure Laterality Date  ? ABDOMINAL HYSTERECTOMY    ? BREAST  BIOPSY Left 11/04/2019  ?  BENIGN LYMPH NODE WITH PARACORTICAL HYPERPLASIA  ? CHOLECYSTECTOMY    ? COLONOSCOPY N/A 01/08/2018  ? Procedure: COLONOSCOPY;  Surgeon: Yetta Flock, MD;  Location: [facility];  Service: Gastroenterology;  Laterality: N/A;  ? LAPAROSCOPIC GASTRIC BANDING    ? LAPAROSCOPIC REPAIR AND REMOVAL OF GASTRIC BAND  2013-2014  ? ?No Known Allergies ? ?Family History  ?Problem Relation Age of Onset  ? Hypertension Mother   ? Stroke Mother   ? Diabetes Father   ? Alcohol abuse Father   ? Kidney disease Father   ? Colon polyps Brother   ? Cancer Brother   ? Alcohol abuse Brother   ? ? ?Social History  ? ?Socioeconomic History  ? Marital status: Married  ?  Spouse name: Not on file  ? Number of children: 1  ? Years of education: Not on file  ? Highest education level: Not on file  ?Occupational History  ? Not on file  ?Tobacco Use  ? Smoking status: Former  ?  Types: Cigarettes  ?  Quit date: 10/02/1972  ?  Years since quitting: 49.1  ? Smokeless tobacco: Never  ?Vaping Use  ? Vaping Use: Never used  ?Substance and Sexual Activity  ? Alcohol use: No  ? Drug use: No  ? Sexual activity: Never  ?Other Topics Concern  ? Not on file  ?Social History Narrative  ? Not on file  ? ?Social Determinants of Health  ? ?Financial Resource Strain: Medium Risk  ? Difficulty of Paying Living Expenses: Somewhat hard  ?Food Insecurity: Not on file  ?Transportation Needs: No Transportation Needs  ? Lack of Transportation (Medical): No  ? Lack of Transportation (Non-Medical): No  ?Physical Activity: Insufficiently Active  ? Days of Exercise per Week: 2 days  ? Minutes of Exercise per Session: 30 min  ?Stress: Not on file  ?Social Connections: Socially Integrated  ? Frequency of Communication with Friends and Family: Twice a week  ? Frequency of Social Gatherings with Friends and Family: Twice a week  ? Attends Religious Services: More than 4 times per year  ? Active Member of Clubs or Organizations: Yes  ? Attends  Archivist Meetings: 1 to 4 times per year  ? Marital Status: Married  ? ? ?Vitals:  ? 11/01/21 0931  ?BP: 132/80  ?Pulse: 81  ?Resp: 16  ?Temp: 98.4 ?F (36.9 ?C)  ?SpO2: 99%  ? ?Body mass index is 46.17 k

## 2021-11-01 ENCOUNTER — Encounter: Payer: Self-pay | Admitting: Family Medicine

## 2021-11-01 ENCOUNTER — Ambulatory Visit (INDEPENDENT_AMBULATORY_CARE_PROVIDER_SITE_OTHER): Payer: No Typology Code available for payment source | Admitting: Family Medicine

## 2021-11-01 VITALS — BP 132/80 | HR 81 | Temp 98.4°F | Resp 16 | Ht 61.0 in | Wt 244.4 lb

## 2021-11-01 DIAGNOSIS — E785 Hyperlipidemia, unspecified: Secondary | ICD-10-CM

## 2021-11-01 DIAGNOSIS — E876 Hypokalemia: Secondary | ICD-10-CM

## 2021-11-01 DIAGNOSIS — E559 Vitamin D deficiency, unspecified: Secondary | ICD-10-CM | POA: Diagnosis not present

## 2021-11-01 DIAGNOSIS — G5603 Carpal tunnel syndrome, bilateral upper limbs: Secondary | ICD-10-CM

## 2021-11-01 DIAGNOSIS — M65341 Trigger finger, right ring finger: Secondary | ICD-10-CM

## 2021-11-01 DIAGNOSIS — R7303 Prediabetes: Secondary | ICD-10-CM

## 2021-11-01 DIAGNOSIS — I10 Essential (primary) hypertension: Secondary | ICD-10-CM

## 2021-11-01 DIAGNOSIS — Z780 Asymptomatic menopausal state: Secondary | ICD-10-CM

## 2021-11-01 DIAGNOSIS — L250 Unspecified contact dermatitis due to cosmetics: Secondary | ICD-10-CM

## 2021-11-01 DIAGNOSIS — Z0000 Encounter for general adult medical examination without abnormal findings: Secondary | ICD-10-CM | POA: Diagnosis not present

## 2021-11-01 LAB — LIPID PANEL
Cholesterol: 173 mg/dL (ref 0–200)
HDL: 40.6 mg/dL (ref >39.00)
LDL Cholesterol: 101 mg/dL — ABNORMAL HIGH (ref 0–99)
NonHDL: 132.24
Total CHOL/HDL Ratio: 4
Triglycerides: 155 mg/dL — ABNORMAL HIGH (ref 0.0–149.0)
VLDL: 31 mg/dL (ref 0.0–40.0)

## 2021-11-01 LAB — HEMOGLOBIN A1C: Hgb A1c MFr Bld: 6.2 % (ref 4.6–6.5)

## 2021-11-01 LAB — POTASSIUM: Potassium: 3.7 mEq/L (ref 3.5–5.1)

## 2021-11-01 LAB — VITAMIN D 25 HYDROXY (VIT D DEFICIENCY, FRACTURES): VITD: 11.51 ng/mL — ABNORMAL LOW (ref 30.00–100.00)

## 2021-11-01 MED ORDER — DESONIDE 0.05 % EX OINT: 1.0000 "application " | TOPICAL_OINTMENT | Freq: Every day | CUTANEOUS | 0 refills | Status: AC

## 2021-11-01 NOTE — Patient Instructions (Addendum)
A few things to remember from today's visit: ? ?Routine general medical examination at a health care facility ? ?Hyperlipidemia, unspecified hyperlipidemia type - Plan: Lipid panel ? ?Prediabetes - Plan: Hemoglobin A1c ? ?Contact dermatitis due to cosmetics, unspecified contact dermatitis type ? ?Trigger ring finger of right hand - Plan: Ambulatory referral to Sports Medicine ? ?Asymptomatic postmenopausal estrogen deficiency - Plan: DEXAScan ? ?Vitamin D deficiency, unspecified - Plan: VITAMIN D 25 Hydroxy (Vit-D Deficiency, Fractures) ? ?Hypokalemia - Plan: Potassium ?Recommend optimizing calcium (1200 mg/day) and vitamin D (800 IU/day) . ?If you need refills please call your pharmacy. ?Do not use My Chart to request refills or for acute issues that need immediate attention. ?  ? ?Please be sure medication list is accurate. ?If a new problem present, please set up appointment sooner than planned today. ? ?Preventive Care 22 Years and Older, Female ?Preventive care refers to lifestyle choices and visits with your health care provider that can promote health and wellness. Preventive care visits are also called wellness exams. ?What can I expect for my preventive care visit? ?Counseling ?Your health care provider may ask you questions about your: ?Medical history, including: ?Past medical problems. ?Family medical history. ?Pregnancy and menstrual history. ?History of falls. ?Current health, including: ?Memory and ability to understand (cognition). ?Emotional well-being. ?Home life and relationship well-being. ?Sexual activity and sexual health. ?Lifestyle, including: ?Alcohol, nicotine or tobacco, and drug use. ?Access to firearms. ?Diet, exercise, and sleep habits. ?Work and work Statistician. ?Sunscreen use. ?Safety issues such as seatbelt and bike helmet use. ?Physical exam ?Your health care provider will check your: ?Height and weight. These may be used to calculate your BMI (body mass index). BMI is a  measurement that tells if you are at a healthy weight. ?Waist circumference. This measures the distance around your waistline. This measurement also tells if you are at a healthy weight and may help predict your risk of certain diseases, such as type 2 diabetes and high blood pressure. ?Heart rate and blood pressure. ?Body temperature. ?Skin for abnormal spots. ?What immunizations do I need? ?Vaccines are usually given at various ages, according to a schedule. Your health care provider will recommend vaccines for you based on your age, medical history, and lifestyle or other factors, such as travel or where you work. ?What tests do I need? ?Screening ?Your health care provider may recommend screening tests for certain conditions. This may include: ?Lipid and cholesterol levels. ?Hepatitis C test. ?Hepatitis B test. ?HIV (human immunodeficiency virus) test. ?STI (sexually transmitted infection) testing, if you are at risk. ?Lung cancer screening. ?Colorectal cancer screening. ?Diabetes screening. This is done by checking your blood sugar (glucose) after you have not eaten for a while (fasting). ?Mammogram. Talk with your health care provider about how often you should have regular mammograms. ?BRCA-related cancer screening. This may be done if you have a family history of breast, ovarian, tubal, or peritoneal cancers. ?Bone density scan. This is done to screen for osteoporosis. ?Talk with your health care provider about your test results, treatment options, and if necessary, the need for more tests. ?Follow these instructions at home: ?Eating and drinking ? ?Eat a diet that includes fresh fruits and vegetables, whole grains, lean protein, and low-fat dairy products. Limit your intake of foods with high amounts of sugar, saturated fats, and salt. ?Take vitamin and mineral supplements as recommended by your health care provider. ?Do not drink alcohol if your health care provider tells you not to drink. ?If you drink  alcohol: ?Limit how much you have to 0-1 drink a day. ?Know how much alcohol is in your drink. In the [place] one drink equals one 12 oz bottle of beer (355 mL), one 5 oz glass of wine (148 mL), or one 1? oz glass of hard liquor (44 mL). ?Lifestyle ?Brush your teeth every morning and night with fluoride toothpaste. Floss one time each day. ?Exercise for at least 30 minutes 5 or more days each week. ?Do not use any products that contain nicotine or tobacco. These products include cigarettes, chewing tobacco, and vaping devices, such as e-cigarettes. If you need help quitting, ask your health care provider. ?Do not use drugs. ?If you are sexually active, practice safe sex. Use a condom or other form of protection in order to prevent STIs. ?Take aspirin only as told by your health care provider. Make sure that you understand how much to take and what form to take. Work with your health care provider to find out whether it is safe and beneficial for you to take aspirin daily. ?Ask your health care provider if you need to take a cholesterol-lowering medicine (statin). ?Find healthy ways to manage stress, such as: ?Meditation, yoga, or listening to music. ?Journaling. ?Talking to a trusted person. ?Spending time with friends and family. ?Minimize exposure to UV radiation to reduce your risk of skin cancer. ?Safety ?Always wear your seat belt while driving or riding in a vehicle. ?Do not drive: ?If you have been drinking alcohol. Do not ride with someone who has been drinking. ?When you are tired or distracted. ?While texting. ?If you have been using any mind-altering substances or drugs. ?Wear a helmet and other protective equipment during sports activities. ?If you have firearms in your house, make sure you follow all gun safety procedures. ?What's next? ?Visit your health care provider once a year for an annual wellness visit. ?Ask your health care provider how often you should have your eyes and teeth checked. ?Stay up  to date on all vaccines. ?This information is not intended to replace advice given to you by your health care provider. Make sure you discuss any questions you have with your health care provider. ?Document Revised: 01/11/2021 Document Reviewed: 01/11/2021 ?Elsevier Patient Education ? [employer]. ? ?Trigger Finger ?Trigger finger, also called stenosing tenosynovitis,  is a condition that causes a finger to get stuck in a bent position. Each finger has a tendon, which is a tough, cord-like tissue that connects muscle to bone, and each tendon passes through a tunnel of tissue called a tendon sheath. ?To move your finger, your tendon needs to glide freely through the sheath. Trigger finger happens when the tendon or the sheath thickens, making it difficult to move your finger. Trigger finger can affect any finger or a thumb. It may affect more than one finger. Mild cases may clear up with rest and medicine. Severe cases require more treatment. ?What are the causes? ?Trigger finger is caused by a thickened finger tendon or tendon sheath. The cause of this thickening is not known. ?What increases the risk? ?The following factors may make you more likely to develop this condition: ?Doing activities that require a strong grip. ?Having rheumatoid arthritis, gout, or diabetes. ?Being 48-26 years old. ?Being female. ?What are the signs or symptoms? ?Symptoms of this condition include: ?Pain when bending or straightening your finger. ?Tenderness or swelling where your finger attaches to the palm of your hand. ?A lump in the palm of your hand or on  the inside of your finger. ?Hearing a noise like a pop or a snap when you try to straighten your finger. ?Feeling a catching or locking sensation when you try to straighten your finger. ?Being unable to straighten your finger. ?How is this diagnosed? ?This condition is diagnosed based on your symptoms and a physical exam. ?How is this treated? ?This condition may be treated  by: ?Resting your finger and avoiding activities that make symptoms worse. ?Wearing a finger splint to keep your finger extended. ?Taking NSAIDs, such as ibuprofen, to relieve pain and swelling. ?Doing gentle exerc

## 2021-11-01 NOTE — Assessment & Plan Note (Signed)
Continue Pravastatin 20 mg daily. ?Low fat diet also recommended. ?

## 2021-11-01 NOTE — Assessment & Plan Note (Signed)
For now recommend Vit D 800 U daily. ?Further recommendations according to 25 OH vit D result.  ?

## 2021-11-01 NOTE — Assessment & Plan Note (Signed)
Continue current dose of KLOR. ?Further recommendations according to K+ result. ?

## 2021-11-07 NOTE — Progress Notes (Signed)
? ? Benito Mccreedy D.Merril Abbe ?[facility] ?[place] ?Phone: [phone] ?  ?Assessment and Plan:   ?  ?1. Right hand pain ?2. 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 ?- X-ray obtained in clinic.  My interpretation no acute fracture or dislocation.  No significant cortical changes of fourth digit.  Cortical changes most significant at the [facility] joint ?  ?Pertinent previous records reviewed include none ?  ?Follow Up: As needed if no improving or worsening of symptoms.  If triggering worsens, would recommend follow-up for trigger point CSI ?  ?Subjective:   ?I, Pincus Badder, am serving as a Education administrator for [employer] ? ?Chief Complaint: right hand trigger finger  ? ?HPI:  ?11/08/2021 ?Patient is a 66 year old female complaining of right hand trigger finger. Patient states the right finger has a click in it , at night the whole hand is aching sometimes when she hits it lots and lots of pain, she isnt able to peel her potatoes anymore due to pain 4th digit pain, pain and locking has been going on for about a month, no numbness or tingling, she cant bend her finger now she can feel the hurt,  ? ?Relevant Historical Information: Hypertension, GERD, ? ?Additional pertinent review of systems negative. ? ? ?Current Outpatient Medications:  ?  acetaminophen (TYLENOL) 500 MG tablet, Take 1-2 tablets (500-1,000 mg total) by mouth daily as needed for moderate pain (pain)., Disp: 60 tablet, Rfl: 1 ?  amLODipine (NORVASC) 5 MG tablet, Take 0.5 tablets (2.5 mg total) by mouth daily., Disp: 90  tablet, Rfl: 1 ?  desonide (DESOWEN) 0.05 % ointment, Apply 1 application. topically daily for 14 days. For up to 2 weeks., Disp: 15 g, Rfl: 0 ?  esomeprazole (NEXIUM) 40 MG capsule, TAKE 1 CAPSULE BY MOUTH EVERY DAY (Patient taking differently: Take 40 mg by mouth daily.), Disp: 90 capsule, Rfl: 3 ?  gabapentin (NEURONTIN) 100 MG capsule, Take 1 capsule (100 mg total) by mouth at bedtime., Disp: 30 capsule, Rfl: 2 ?  ibuprofen (ADVIL) 400 MG tablet, TAKE 1 TABLET (400 MG TOTAL) BY MOUTH DAILY AS NEEDED. (Patient taking differently: Take 400 mg by mouth daily as needed for fever, headache or mild pain.), Disp: 30 tablet, Rfl: 1 ?  loperamide (IMODIUM) 2 MG capsule, Take 1 capsule (2 mg total) by mouth 4 (four) times daily as needed for diarrhea or loose stools., Disp: 12 capsule, Rfl: 0 ?  losartan (COZAAR) 100 MG tablet, Take 1 tablet (100 mg total) by mouth daily., Disp: 90 tablet, Rfl: 2 ?  Multiple Vitamins-Minerals (ZINC PO), Take 1 tablet by mouth daily., Disp: , Rfl:  ?  ondansetron (ZOFRAN) 4 MG tablet, Take 1 tablet (4 mg total) by mouth every 6 (six) hours as needed for nausea or vomiting., Disp: 20 tablet, Rfl: 0 ?  potassium chloride (KLOR-CON) 10 MEQ tablet, TAKE 1 TABLET BY MOUTH EVERY DAY (Patient taking differently: Take 10 mEq by mouth daily.), Disp: 90 tablet, Rfl: 2 ?  pravastatin (PRAVACHOL) 20 MG tablet, TAKE 1 TABLET BY MOUTH EVERY DAY, Disp: 90 tablet, Rfl:  3 ?  spironolactone (ALDACTONE) 25 MG tablet, TAKE 1 TABLET (25 MG TOTAL) BY MOUTH DAILY., Disp: 90 tablet, Rfl: 1  ? ?Objective:   ?  ?Vitals:  ? 11/08/21 1044  ?BP: (!) 150/84  ?Pulse: 87  ?SpO2: 99%  ?Weight: 244 lb (110.7 kg)  ?Height: '5\' 1"'$  (1.549 m)  ?  ?  ?Body mass index is 46.1 kg/m?.  ?  ?Physical Exam:   ? ?General: Appears well, nad, nontoxic and pleasant ?Neuro:sensation intact, strength is 5/5 with df/pf/inv/ev, muscle tone wnl ?Skin:no susupicious lesions or rashes ? ?Right hand/wrist:  No deformity or swelling  appreciated. ?Wrist ROM  Ext 90, flexion70, radial/ulnar deviation 30 ?Mild TTP at fourth MCP with nodule felt just proximal to palmar fourth MCP, though no triggering on physical exam ?nttp over the snauff box, dorsal carpals, volar carpals, radial styloid, ulnar styloid, 1st mcp, tfcc ?Negative Tinel's ?Negative finklestein ?Neg tfcc bounce test ?No pain with fourth finger resisted ext, flex or deviation  ? ? ?Electronically signed by:  ?Benito Mccreedy D.Merril Abbe ?[facility] ?11:18 AM 11/08/21 ?

## 2021-11-08 ENCOUNTER — Ambulatory Visit (INDEPENDENT_AMBULATORY_CARE_PROVIDER_SITE_OTHER): Payer: No Typology Code available for payment source

## 2021-11-08 ENCOUNTER — Ambulatory Visit (INDEPENDENT_AMBULATORY_CARE_PROVIDER_SITE_OTHER): Payer: No Typology Code available for payment source | Admitting: Sports Medicine

## 2021-11-08 VITALS — BP 150/84 | HR 87 | Ht 61.0 in | Wt 244.0 lb

## 2021-11-08 DIAGNOSIS — M6530 Trigger finger, unspecified finger: Secondary | ICD-10-CM | POA: Diagnosis not present

## 2021-11-08 DIAGNOSIS — M79641 Pain in right hand: Secondary | ICD-10-CM | POA: Diagnosis not present

## 2021-11-08 NOTE — Patient Instructions (Addendum)
Good to see you  ?Finger splint  ?Wear frog for two days  can repeat US if catching flares up again  ?Start Voltaren fel over base of finger 1-2 times a day  ?Tylenol as needed for pain relief ?Warm hand baths ?As needed follow up for injection if needed ? ?

## 2021-11-09 DIAGNOSIS — L239 Allergic contact dermatitis, unspecified cause: Secondary | ICD-10-CM | POA: Diagnosis not present

## 2021-11-16 ENCOUNTER — Other Ambulatory Visit: Payer: Self-pay | Admitting: Family Medicine

## 2021-11-16 DIAGNOSIS — Z780 Asymptomatic menopausal state: Secondary | ICD-10-CM

## 2021-11-16 DIAGNOSIS — Z1231 Encounter for screening mammogram for malignant neoplasm of breast: Secondary | ICD-10-CM

## 2021-12-09 ENCOUNTER — Encounter: Payer: Self-pay | Admitting: Gastroenterology

## 2021-12-12 ENCOUNTER — Telehealth: Payer: Self-pay | Admitting: Pharmacist

## 2021-12-12 ENCOUNTER — Telehealth: Payer: Self-pay

## 2021-12-12 DIAGNOSIS — I10 Essential (primary) hypertension: Secondary | ICD-10-CM

## 2021-12-12 DIAGNOSIS — E785 Hyperlipidemia, unspecified: Secondary | ICD-10-CM

## 2021-12-12 NOTE — Telephone Encounter (Signed)
-----   Message from Viona Gilmore, [facility] sent at 12/12/2021 12:55 PM EDT ----- ?Regarding: CCM referral ?Hi, ? ?[facility] has changed their process and require the referrals to be placed again to see me. Can you put one in for her? It has changed slightly and the new code is [id number]. If you need me to walk you through it, I would be happy to! ? ?Thanks! ?Maddie ? ?

## 2021-12-12 NOTE — Chronic Care Management (AMB) (Signed)
? ? ?  Chronic Care Management ?Pharmacy Assistant  ? ?Name: Jennifer Davila  MRN: [id number] DOB: August 07, 1955 ? ?12/13/2021 APPOINTMENT REMINDER ? ? ?[place], No answer, left message of appointment on 12/13/2021 at 1:00 via telephone visit with [name], Pharm D. Notified to have all medications, supplements, blood pressure and/or blood sugar logs available during appointment and to return call if need to reschedule. ? ?Care Gaps: ?AWV - message to Ramond Craver ?Last BP - 150/84 on 11/08/2021 ?Covid booster - overdue ?Mammogram - scheduled ?Shingrix - postponed ? ?Star Rating Drug: ?Losartan 100 mg - last filled 10/06/2021 [facility] ?Pravastatin 20 mg  - last filled 09/26/2021 90 [facility] at [facility] ? ?Any gaps in medications fill history? No ? ?Gennie Alma CMA  ?Clinical Pharmacist Assistant ?[phone] ? ?

## 2021-12-13 ENCOUNTER — Ambulatory Visit (INDEPENDENT_AMBULATORY_CARE_PROVIDER_SITE_OTHER): Payer: No Typology Code available for payment source | Admitting: Pharmacist

## 2021-12-13 DIAGNOSIS — E785 Hyperlipidemia, unspecified: Secondary | ICD-10-CM

## 2021-12-13 DIAGNOSIS — I10 Essential (primary) hypertension: Secondary | ICD-10-CM

## 2021-12-13 NOTE — Progress Notes (Cosign Needed)
? ?Chronic Care Management ?Pharmacy Note ? ?12/13/2021 ?Name:  Jennifer Davila MRN:  [id number] DOB:  [birth year] ? ?Summary: ?LDL not ideally at goal < 100 ?Pt is not checking BP at home ? ?Recommendations/Changes made from today's visit: ?-Recommend limiting use of NSAIDs ?-Recommended routine monitoring of BP at home ?-Consider trial of magnesium OTC for muscle spasms ?-Recommended spacing out calcium to 600 mg BID ? ?Plan: ?BP assessment in 3 months ?Follow up in 6 months ? ? ?Subjective: ?Jennifer Davila is an 66 y.o. year old female who is a primary patient of Martinique, Malka So, MD.  The CCM team was consulted for assistance with disease management and care coordination needs.   ? ?Engaged with patient by telephone for follow up visit in response to provider referral for pharmacy case management and/or care coordination services.  ? ?Consent to Services:  ?The patient was given information about Chronic Care Management services, agreed to services, and gave verbal consent prior to initiation of services.  Please see initial visit note for detailed documentation.  ? ?Patient Care Team: ?Martinique, [name], MD as PCP - General (Family Medicine) ?Viona Gilmore, [facility] as Pharmacist (Pharmacist) ? ?Recent office visits: ?11/01/21 [name], MD: Patient presented for annual exam. Recommended OTC Vit D 5000 U daily for 30 days then 2000 U daily. ?  ?08/01/2021 [name] MD - Patient was seen for hypertension and additional issues. Started Gabapentin 100 mg daily. No follow up noted. ? ?Recent consult visits: ?11/08/21 Glennon Mac, DO (sports medicine): Patient presented for right hand pain. ? ?07/11/2021 Inda Coke PA - Patient was seen for [facility] and fatigue. No medication changes. No follow up needed. ? ?Hospital visits: ?Admitted to [facility] 07/18/2021 due to GI bleed. Discharge date was 07/20/2021.  ?New?Medications Started at [facility] Discharge:?? ?nirmatrelvir/ritonavir  EUA (PAXLOVID) ?Medication Changes at Hospital Discharge: ?amLODipine ([place]) ?Medications Discontinued at Hospital Discharge: ?pravastatin 20 MG tablet (PRAVACHOL) ?spironolactone 25 MG tablet (ALDACTONE) ?Medications that remain the same after Hospital Discharge:??  ?-All other medications will remain the same.   ?  ?  ?Patient was seen at [facility] on 07/11/2021 due to Influenza like illness. Discharge after 37 minutes. ?New?Medications Started at [facility] Discharge:?? ?Loperamide Take 1 capsule (2 mg total) by mouth 4 (four) times daily as needed for diarrhea or loose stools ?Ondansetron Take 1 tablet (4 mg total) by mouth every 6 (six) hours ?Medication Changes at Hospital Discharge: ?-No medication changes ?Medications Discontinued at Hospital Discharge: ?-No medication changes ?Medications that remain the same after Hospital Discharge:??  ?-All other medications will remain the same.   ?  ? ?Objective: ? ?Lab Results  ?Component Value Date  ? CREATININE 0.95 08/01/2021  ? BUN 13 08/01/2021  ? GFR 61.88 08/01/2021  ? GFRNONAA >60 07/19/2021  ? GFRAA 68 04/19/2020  ? NA 139 08/01/2021  ? K 3.7 11/01/2021  ? CALCIUM 9.1 08/01/2021  ? CO2 30 08/01/2021  ? GLUCOSE 100 (H) 08/01/2021  ? ? ?Lab Results  ?Component Value Date/Time  ? HGBA1C 6.2 11/01/2021 10:40 AM  ? HGBA1C 6.2 05/30/2021 11:38 AM  ? GFR 61.88 08/01/2021 11:31 AM  ? GFR 63.56 05/30/2021 11:38 AM  ?  ?Last diabetic Eye exam: No results found for: HMDIABEYEEXA  ?Last diabetic Foot exam: No results found for: HMDIABFOOTEX  ? ?Lab Results  ?Component Value Date  ? CHOL 173 11/01/2021  ? HDL 40.60 11/01/2021  ? LDLCALC 101 (H) 11/01/2021  ?  TRIG 155.0 (H) 11/01/2021  ? CHOLHDL 4 11/01/2021  ? ? ? ?  Latest Ref Rng & Units 07/19/2021  ?  4:36 AM 07/18/2021  ?  1:57 PM 04/19/2020  ? 10:51 AM  ?Hepatic Function  ?Total Protein 6.5 - 8.1 g/dL 7.8   7.9   7.3    ?Albumin 3.5 - 5.0 g/dL 4.1   4.2   4.6    ?AST 15 - 41 U/L _0 ?ALT 0 -  44 U/L _1 ?Alk Phosphatase 38 - 126 U/L 93   99   112    ?Total Bilirubin 0.3 - 1.2 mg/dL 0.9   0.7   0.3    ? ? ?Lab Results  ?Component Value Date/Time  ? TSH 5.709 (H) 08/04/2015 12:08 PM  ? ? ? ?  Latest Ref Rng & Units 08/01/2021  ? 11:31 AM 07/19/2021  ?  4:36 AM 07/18/2021  ? 11:18 PM  ?CBC  ?WBC 4.0 - 10.5 K/uL 6.4   7.8   6.7    ?Hemoglobin 12.0 - 15.0 g/dL 11.0   12.1   11.7    ?Hematocrit 36.0 - 46.0 % 35.2   39.1   37.9    ?Platelets 150.0 - 400.0 K/uL 308.0   364   325    ? ? ?Lab Results  ?Component Value Date/Time  ? VD25OH 11.51 (L) 11/01/2021 10:40 AM  ? VD25OH 38 02/23/2020 10:47 AM  ? VD25OH 26.17 (L) 12/22/2019 11:53 AM  ? ? ?Clinical ASCVD: No  ?The 10-year ASCVD risk score (Arnett DK, et al., 2019) is: 13.2% ?  Values used to calculate the score: ?    Age: 66 years ?    Sex: Female ?    Is Non-Hispanic African American: Yes ?    Diabetic: No ?    Tobacco smoker: No ?    Systolic Blood Pressure: 553 mmHg ?    Is BP treated: Yes ?    HDL Cholesterol: 40.6 mg/dL ?    Total Cholesterol: 173 mg/dL   ? ? ?  12/19/2020  ?  2:07 PM 12/19/2020  ?  2:03 PM 08/27/2019  ?  6:37 PM  ?Depression screen PHQ 2/9  ?Decreased Interest 0 0 0  ?Down, Depressed, Hopeless 0 0 0  ?PHQ - 2 Score 0 0 0  ?  ? ? ?Social History  ? ?Tobacco Use  ?Smoking Status Former  ? Types: Cigarettes  ? Quit date: 10/02/1972  ? Years since quitting: 49.2  ?Smokeless Tobacco Never  ? ?BP Readings from Last 3 Encounters:  ?11/08/21 (!) 150/84  ?11/01/21 132/80  ?08/01/21 136/80  ? ?Pulse Readings from Last 3 Encounters:  ?11/08/21 87  ?11/01/21 81  ?08/01/21 90  ? ?Wt Readings from Last 3 Encounters:  ?11/08/21 244 lb (110.7 kg)  ?11/01/21 244 lb 6 oz (110.8 kg)  ?08/01/21 244 lb (110.7 kg)  ? ?BMI Readings from Last 3 Encounters:  ?11/08/21 46.10 kg/m?  ?11/01/21 46.17 kg/m?  ?08/01/21 46.10 kg/m?  ? ? ?Assessment/Interventions: Review of patient past medical history, allergies, medications, health status, including review of  consultants reports, laboratory and other test data, was performed as part of comprehensive evaluation and provision of chronic care management services.  ? ?SDOH:  (Social Determinants of Health) assessments and interventions performed: Yes ? ? ?SDOH Screenings  ? ?Alcohol Screen: Not on file  ?Depression (PHQ2-9):  Low Risk   ? PHQ-2 Score: 0  ?Financial Resource Strain: Medium Risk  ? Difficulty of Paying Living Expenses: Somewhat hard  ?Food Insecurity: Not on file  ?Housing: Low Risk   ? Last Housing Risk Score: 0  ?Physical Activity: Insufficiently Active  ? Days of Exercise per Week: 2 days  ? Minutes of Exercise per Session: 30 min  ?Social Connections: Socially Integrated  ? Frequency of Communication with Friends and Family: Twice a week  ? Frequency of Social Gatherings with Friends and Family: Twice a week  ? Attends Religious Services: More than 4 times per year  ? Active Member of Clubs or Organizations: Yes  ? Attends Archivist Meetings: 1 to 4 times per year  ? Marital Status: Married  ?Stress: Not on file  ?Tobacco Use: Medium Risk  ? Smoking Tobacco Use: Former  ? Smokeless Tobacco Use: Never  ? Passive Exposure: Not on file  ?Transportation Needs: No Transportation Needs  ? Lack of Transportation (Medical): No  ? Lack of Transportation (Non-Medical): No  ? ?CCM Care Plan ? ?No Known Allergies ? ?Medications Reviewed Today   ? ? Reviewed by Viona Gilmore, [place] (Pharmacist) on 12/13/21 at [place] List Status: <None>  ? ?Medication Order Taking? Sig Documenting Provider Last Dose Status Informant  ?acetaminophen (TYLENOL) 500 MG tablet [id number] Yes Take 1-2 tablets (500-1,000 mg total) by mouth daily as needed for moderate pain (pain). Newt Minion, MD Taking Active Self  ?amLODipine (NORVASC) 5 MG tablet [id number] Yes Take 0.5 tablets (2.5 mg total) by mouth daily.  ?Patient taking differently: Take 5 mg by mouth daily.  ? Danford, Suann Larry, MD Taking Active   ?calcium  carbonate (OSCAL) 1500 (600 Ca) MG TABS tablet [id number] Yes Take 1 tablet by mouth 2 (two) times daily with a meal. [provider] Taking Active   ?Cholecalciferol 50 MCG (2000 UT) CAPS [id number] Yes

## 2021-12-13 NOTE — Patient Instructions (Signed)
Hi Jackie, ? ?It was great to catch up with you again! Like I mentioned on the phone, if you want to take magnesium over the counter that might help your cramping at night. ? ?Also, don't forget to spread out that calcium to take it twice a day rather than all at once. ? ?Please reach out to me if you have any questions or need anything before our follow up! ? ?Best, ?Maddie ? ?[name], PharmD, BCACP ?Clinical Pharmacist ?Therapist, music at [place] ?[phone] ? ? Visit Information ? ? Goals Addressed   ?None ?  ? ?Patient Care Plan: [place]  ?  ? ?Problem Identified: Problem: Hypertension, Hyperlipidemia, GERD, and Pain and prediabetes   ?  ? ?Long-Range Goal: Patient-Specific Goal   ?Start Date: 02/02/2021  ?Expected End Date: 02/02/2022  ?Recent Progress: On track  ?Priority: High  ?Note:   ?Current Barriers:  ?Unable to independently afford treatment regimen ?Unable to independently monitor therapeutic efficacy ?Unable to achieve control of cholesterol  ? ?Pharmacist Clinical Goal(s):  ?Patient will verbalize ability to afford treatment regimen ?achieve adherence to monitoring guidelines and medication adherence to achieve therapeutic efficacy ?achieve control of cholesterol as evidenced by next lipid panel ?maintain control of blood pressure as evidenced by home BP readings  through collaboration with PharmD and provider.  ? ?Interventions: ?1:1 collaboration with Martinique, [name], MD regarding development and update of comprehensive plan of care as evidenced by provider attestation and co-signature ?Inter-disciplinary care team collaboration (see longitudinal plan of care) ?Comprehensive medication review performed; medication list updated in electronic medical record ? ?Hypertension (BP goal <140/90) ?-Not ideally controlled ?-Current treatment: ?Losartan 100 mg 1 tablet daily - Appropriate, Query effective, Safe, Accessible ?Amlodipine 5 mg 1 tablet daily at bedtime - Appropriate,  Query effective, Safe, Accessible ?Spironolactone 25 mg 1 tablet daily - Appropriate, Query effective, Safe, Accessible ?-Medications previously tried: HCTZ (low potassium)  ?-Current home readings: does not check at home ?-Current dietary habits: tries not to cook with salt; does eat red meat and hamburgers and not often chicken or ground [place] ?-Current exercise habits: does not exercise ?-Denies hypotensive/hypertensive symptoms ?-Educated on BP goals and benefits of medications for prevention of heart attack, stroke and kidney damage; ?Daily salt intake goal < 2300 mg; ?Exercise goal of 150 minutes per week; ?Importance of home blood pressure monitoring; ?Proper BP monitoring technique; ?-Counseled to monitor BP at home weekly, document, and provide log at future appointments ?-Counseled on diet and exercise extensively ?Recommended to continue current medication ?Recommended using youtube as a source of exercise videos ? ?Hyperlipidemia: (LDL goal < 100) ?-Not ideally controlled ?-Current treatment: ?Pravastatin 20 mg 1 tablet daily - Appropriate, Query effective, Safe, Accessible ?-Medications previously tried:  none ?-Current dietary patterns: no fried foods; uses vegetable oil instead of canola or olive oil due to cost; eats some fruit and vegetables (doesn't like oatmeal) ?-Current exercise habits: does not exercise ?-Educated on Cholesterol goals;  ?Benefits of statin for ASCVD risk reduction; ?Importance of limiting foods high in cholesterol; ?Exercise goal of 150 minutes per week; ?-Counseled on diet and exercise extensively ?Recommended to continue current medication ? ?Pre-diabetes (A1c goal <6.5%) ?-Controlled ?-Current medications: ?No medications ?-Medications previously tried: none  ?-Current home glucose readings ?fasting glucose: does not need to check ?post prandial glucose: does not need to check ?-Denies hypoglycemic/hyperglycemic symptoms ?-Current meal patterns:  ?breakfast: sometimes she  skips or eats 2 pieces of bread  ?lunch: does not eat  ?dinner:  only meal of the day - protein and tries to have a vegetable ?snacks: cheezits, grapes ?drinks: water, coffee, some juice ?-Current exercise: does not exercise ?-Educated on A1c and blood sugar goals; ?Exercise goal of 150 minutes per week; ?Benefits of weight loss; ?Carbohydrate counting and/or plate method ?-Counseled to check feet daily and get yearly eye exams ?-Counseled on diet and exercise extensively ?Counseled on adding protein to meals and eating smaller meals throughout the day instead of one big meal. Patient will try to move her snacks to earlier in the day instead of bedtime and will add veggies instead of fruit & cheezits (both carbs) ? ?GERD (Goal: minimize symptoms) ?-Controlled ?-Current treatment  ?Esomeprazole 40 mg 1 capsule daily - Appropriate, Effective, Query Safe, Accessible ?-Medications previously tried: none  ?-Counseled on non-pharmacologic management of symptoms such as elevating the head of your bed, avoiding eating 2-3 hours before bed, avoiding triggering foods such as acidic, spicy, or fatty foods, eating smaller meals, and wearing clothes that are loose around the waist ?Recommended considering long term risks of PPIs and taper in the future to see how she tolerates. Patient preferred to hold off for now. ? ?Pain (Goal: minimize pain) ?-Uncontrolled ?-Current treatment  ?Acetaminophen 500 mg 1-2 tablets daily as needed for pain - Appropriate, Effective, Safe, Accessible ?Ibuprofen 400 mg daily as needed (on Sundays) - Appropriate, Effective, Query Safe, Accessible ?-Medications previously tried: tizanidine (made her drowsy)  ?-Counseled on maximum of 3000 mg of Tylenol per day (6 tablets) and can take every 4-6 hours as needed. ? ?Health Maintenance ?-Vaccine gaps: shingrix (has not had chicken pox), COVID booster ?-Current therapy:  ?Potassium 10 mEq 1 tablet daily ?-Educated on Cost vs benefit of each product must be  carefully weighed by individual consumer ?-Patient is satisfied with current therapy and denies issues ?-Recommended to continue current medication ? ?Patient Goals/Self-Care Activities ?Patient will:  ?- take medications as prescribed ?check blood pressure weekly, document, and provide at future appointments ?target a minimum of 150 minutes of moderate intensity exercise weekly ? ?Follow Up Plan: Telephone follow up appointment with care management team member scheduled for: 6 months ? ?  ?  ? ?Patient verbalizes understanding of instructions and care plan provided today and agrees to view in [place]. Active MyChart status and patient understanding of how to access instructions and care plan via MyChart confirmed with patient.    ?Telephone follow up appointment with pharmacy team member scheduled for: 6 months ? ?Viona Gilmore, RPH  ?

## 2021-12-20 ENCOUNTER — Telehealth: Payer: Self-pay | Admitting: Family Medicine

## 2021-12-20 NOTE — Telephone Encounter (Signed)
Spoke to patient to schedule Medicare Annual Wellness Visit (AWV) either virtually or in office. She stated she has lots of appts right now and would like a call back end of june  Last AWV ;12/19/20  please schedule at anytime with [facility] Nurse Health Advisor 1 or 2

## 2021-12-21 ENCOUNTER — Other Ambulatory Visit: Payer: Self-pay | Admitting: Family Medicine

## 2021-12-21 DIAGNOSIS — M79604 Pain in right leg: Secondary | ICD-10-CM

## 2021-12-21 DIAGNOSIS — R200 Anesthesia of skin: Secondary | ICD-10-CM

## 2021-12-27 DIAGNOSIS — Z87891 Personal history of nicotine dependence: Secondary | ICD-10-CM | POA: Diagnosis not present

## 2021-12-27 DIAGNOSIS — E785 Hyperlipidemia, unspecified: Secondary | ICD-10-CM

## 2021-12-27 DIAGNOSIS — I10 Essential (primary) hypertension: Secondary | ICD-10-CM

## 2022-01-07 ENCOUNTER — Other Ambulatory Visit: Payer: Self-pay | Admitting: Family Medicine

## 2022-01-07 DIAGNOSIS — I10 Essential (primary) hypertension: Secondary | ICD-10-CM

## 2022-02-05 ENCOUNTER — Other Ambulatory Visit: Payer: Self-pay | Admitting: Family Medicine

## 2022-02-05 DIAGNOSIS — M79604 Pain in right leg: Secondary | ICD-10-CM

## 2022-02-06 ENCOUNTER — Telehealth: Payer: Self-pay

## 2022-02-06 NOTE — Telephone Encounter (Signed)
Unsuccessful attempt to reach patient on preferred number listed in notes for scheduled AWV. Left message on voicemail okay to reschedule. 

## 2022-02-09 NOTE — Progress Notes (Unsigned)
ACUTE VISIT No chief complaint on file.  HPI: Jennifer Davila is a 66 y.o. female, who is here today complaining of *** HPI  Review of Systems Rest see pertinent positives and negatives per HPI.  Current Outpatient Medications on File Prior to Visit  Medication Sig Dispense Refill  . acetaminophen (TYLENOL) 500 MG tablet Take 1-2 tablets (500-1,000 mg total) by mouth daily as needed for moderate pain (pain). 60 tablet 1  . amLODipine (NORVASC) 5 MG tablet Take 0.5 tablets (2.5 mg total) by mouth daily. (Patient taking differently: Take 5 mg by mouth daily.) 90 tablet 1  . calcium carbonate (OSCAL) 1500 (600 Ca) MG TABS tablet Take 1 tablet by mouth 2 (two) times daily with a meal.    . Cholecalciferol 50 MCG (2000 UT) CAPS Take 1 capsule by mouth daily.    Marland Kitchen esomeprazole (NEXIUM) 40 MG capsule TAKE 1 CAPSULE BY MOUTH EVERY DAY (Patient taking differently: Take 40 mg by mouth daily.) 90 capsule 3  . gabapentin (NEURONTIN) 100 MG capsule Take 1 capsule (100 mg total) by mouth at bedtime. (Patient not taking: Reported on 12/13/2021) 30 capsule 2  . ibuprofen (ADVIL) 400 MG tablet TAKE 1 TABLET (400 MG TOTAL) BY MOUTH DAILY AS NEEDED. 30 tablet 1  . losartan (COZAAR) 100 MG tablet TAKE 1 TABLET BY MOUTH EVERY DAY 90 tablet 2  . polyethylene glycol (MIRALAX / GLYCOLAX) 17 g packet Take 17 g by mouth every other day.    . potassium chloride (KLOR-CON) 10 MEQ tablet TAKE 1 TABLET BY MOUTH EVERY DAY 90 tablet 2  . pravastatin (PRAVACHOL) 20 MG tablet TAKE 1 TABLET BY MOUTH EVERY DAY 90 tablet 3  . spironolactone (ALDACTONE) 25 MG tablet TAKE 1 TABLET (25 MG TOTAL) BY MOUTH DAILY. 90 tablet 1   No current facility-administered medications on file prior to visit.     Past Medical History:  Diagnosis Date  . Arthritis   . Constipation    on stool softener  . GERD (gastroesophageal reflux disease)   . Hyperlipidemia   . Hypertension   . Trouble swallowing 2013   following lap  band  . Urticaria    No Known Allergies  Social History   Socioeconomic History  . Marital status: Married    Spouse name: Not on file  . Number of children: 1  . Years of education: Not on file  . Highest education level: Not on file  Occupational History  . Not on file  Tobacco Use  . Smoking status: Former    Types: Cigarettes    Quit date: 10/02/1972    Years since quitting: 49.3  . Smokeless tobacco: Never  Vaping Use  . Vaping Use: Never used  Substance and Sexual Activity  . Alcohol use: No  . Drug use: No  . Sexual activity: Never  Other Topics Concern  . Not on file  Social History Narrative  . Not on file   Social Determinants of Health   Financial Resource Strain: Medium Risk (02/02/2021)   Overall Financial Resource Strain (CARDIA)   . Difficulty of Paying Living Expenses: Somewhat hard  Food Insecurity: Not on file  Transportation Needs: No Transportation Needs (02/02/2021)   PRAPARE - Transportation   . Lack of Transportation (Medical): No   . Lack of Transportation (Non-Medical): No  Physical Activity: Insufficiently Active (12/19/2020)   Exercise Vital Sign   . Days of Exercise per Week: 2 days   . Minutes of Exercise  per Session: 30 min  Stress: Not on file  Social Connections: Socially Integrated (12/19/2020)   Social Connection and Isolation Panel [NHANES]   . Frequency of Communication with Friends and Family: Twice a week   . Frequency of Social Gatherings with Friends and Family: Twice a week   . Attends Religious Services: More than 4 times per year   . Active Member of Clubs or Organizations: Yes   . Attends Archivist Meetings: 1 to 4 times per year   . Marital Status: Married    There were no vitals filed for this visit. There is no height or weight on file to calculate BMI.  Physical Exam  ASSESSMENT AND PLAN:  There are no diagnoses linked to this encounter.   No follow-ups on file.   Donnarae Rae G. [place], MD  [facility]. [place] office.  Discharge Instructions   None

## 2022-02-12 ENCOUNTER — Ambulatory Visit (INDEPENDENT_AMBULATORY_CARE_PROVIDER_SITE_OTHER): Payer: No Typology Code available for payment source | Admitting: Family Medicine

## 2022-02-12 ENCOUNTER — Telehealth: Payer: Self-pay

## 2022-02-12 ENCOUNTER — Encounter: Payer: Self-pay | Admitting: Family Medicine

## 2022-02-12 VITALS — BP 134/80 | HR 81 | Temp 98.1°F | Resp 16 | Ht 61.0 in | Wt 245.5 lb

## 2022-02-12 DIAGNOSIS — E559 Vitamin D deficiency, unspecified: Secondary | ICD-10-CM | POA: Diagnosis not present

## 2022-02-12 DIAGNOSIS — M79605 Pain in left leg: Secondary | ICD-10-CM

## 2022-02-12 DIAGNOSIS — E876 Hypokalemia: Secondary | ICD-10-CM | POA: Diagnosis not present

## 2022-02-12 DIAGNOSIS — K219 Gastro-esophageal reflux disease without esophagitis: Secondary | ICD-10-CM

## 2022-02-12 DIAGNOSIS — M79604 Pain in right leg: Secondary | ICD-10-CM

## 2022-02-12 MED ORDER — OMEPRAZOLE 40 MG PO CPDR: 40.0000 mg | DELAYED_RELEASE_CAPSULE | Freq: Every day | ORAL | 2 refills | Status: DC

## 2022-02-12 MED ORDER — PREGABALIN 25 MG PO CAPS: 25.0000 mg | ORAL_CAPSULE | Freq: Every day | ORAL | 1 refills | Status: DC

## 2022-02-12 NOTE — Assessment & Plan Note (Signed)
Problem is stable. Nexium discontinued because of cost. Omeprazole 40 mg daily 30-minute before breakfast started. She understands side effects of NSAIDs. Instructed about warning signs.

## 2022-02-12 NOTE — Assessment & Plan Note (Signed)
Recommend starting vitamin D 2000 units daily. We will plan on checking 25 OH vitamin D next visit.

## 2022-02-12 NOTE — Assessment & Plan Note (Signed)
This is a chronic problem. We reviewed possible etiologies. Now pain is back to baseline. ?  Radicular pain. Peripheral pulses are present. ABI will be arranged. She has tried different medications.  She has had a abnormal lumbar MRI in the past, will repeat it and according to results we will decide about Ortho evaluation versus pain management. She agrees with trial of Lyrica 25 mg at bedtime, we discussed some side effects. Instructed about warning signs. Follow-up in 6 weeks, before if needed.

## 2022-02-12 NOTE — Telephone Encounter (Signed)
--  Caller stated she is having leg pain and it is worse in the left leg and had a pressure indention when she woke up. Legs are swollen, she does have lymphedema. The pain is on the front of the leg. No chest, no dyspnea. No fever.  02/09/2022 3:06:40 PM See HCP within 4 Hours (or PCP triage) D'Heur Lucia Gaskins, RN, [place]  Referrals GO TO FACILITY UNDECIDED  Pt has appt with PCP on 02/12/22 at 11AM

## 2022-02-12 NOTE — Patient Instructions (Addendum)
A few things to remember from today's visit:   Pain in both lower extremities - Plan: MR Lumbar Spine Wo Contrast, VAS [place] ABI WITH/WO TBI, pregabalin (LYRICA) 25 MG capsule  Gastroesophageal reflux disease - Plan: omeprazole (PRILOSEC) 40 MG capsule  If you need refills please call your pharmacy. Do not use My Chart to request refills or for acute issues that need immediate attention.   Nexium discontinued. Omeprazole 40 mg started today for your stomach. Try Lyrica 25 mg at bedtime. Vit D 2000 U daily. Lumbar MRI and legs circulation test ordered today.  Please be sure medication list is accurate. If a new problem present, please set up appointment sooner than planned today.

## 2022-02-12 NOTE — Assessment & Plan Note (Signed)
Last K+ 3.7 in 10/2021. Continue K-Lor 10 mK daily. We will plan on checking BMP next visit.

## 2022-02-19 ENCOUNTER — Ambulatory Visit (INDEPENDENT_AMBULATORY_CARE_PROVIDER_SITE_OTHER): Payer: No Typology Code available for payment source

## 2022-02-19 VITALS — Ht 61.0 in | Wt 245.0 lb

## 2022-02-19 DIAGNOSIS — Z0000 Encounter for general adult medical examination without abnormal findings: Secondary | ICD-10-CM

## 2022-02-19 NOTE — Patient Instructions (Addendum)
Jennifer Davila , Thank you for taking time to come for your Medicare Wellness Visit. I appreciate your ongoing commitment to your health goals. Please review the following plan we discussed and let me know if I can assist you in the future.   These are the goals we discussed:  Goals       No current goals (pt-stated)      Track and Manage My Blood Pressure-Hypertension      Timeframe:  Short-Term Goal Priority:  Medium Start Date:                             Expected End Date:                       Follow Up Date 05/05/21    - check blood pressure weekly - choose a place to take my blood pressure (home, clinic or office, retail store) - write blood pressure results in a log or diary    Why is this important?   You won't feel high blood pressure, but it can still hurt your blood vessels.  High blood pressure can cause heart or kidney problems. It can also cause a stroke.  Making lifestyle changes like losing a little weight or eating less salt will help.  Checking your blood pressure at home and at different times of the day can help to control blood pressure.  If the doctor prescribes medicine remember to take it the way the doctor ordered.  Call the office if you cannot afford the medicine or if there are questions about it.     Notes:       Weight Loss Achieved      Evidence-based guidance:  Review medication that may contribute to weight gain, such as corticosteroid, beta-blocker, tricyclic antidepressant, oral antihyperglycemic; advocate for changes when appropriate.  Perform or refer to registered dietitian to perform comprehensive nutrition assessment that includes disordered-eating behaviors, such as binge-eating, emotional or compulsive eating, grazing.  Counsel patient regarding health risks of obesity and that weight loss goal of 5 to 10 percent of initial weight will improve risk.  Recommend initial weight loss goal of 3 to 5 percent of bodyweight; increase weight-loss goals  based on patient success as achieving greater weight loss continues to reduce risk.  Propose a calorie-reduced diet based on the patient's preferences and health status.  Provide ongoing emotional support or cognitive behavioral therapy and dietitian services (individual, group, virtual) over at least 6 months with a minimum of 14 encounters to best facilitate weight loss.  Provide monthly follow-up for 12 months when weight loss goal is met to assist with maintenance of weight loss.  Encourage increased physical activity or exercise based on individual age, risk, and ability up to 200 to 300 minutes per week that includes aerobic and resistance training.  Encourage reduction in sedentary behaviors by replacing them with nonexercise yet active leisure pursuits.  Identify physical barriers, such as change in posture, balance, gait patterns, joint pain, and environmental barriers to activity.  Consider referral to rehabilitation therapy, especially when mobility or function is impaired due to osteoarthritis and obesity.  Consider referral to weight-loss program that has published evidence of safety and efficacy if on-site intensive intervention is unavailable or patient preference.  Prepare patient for use of pharmacologic therapy as an adjunct to lifestyle changes based on body mass index, patient agreement and presence of risk factors or comorbidities.  Evaluate efficacy of pharmacologic therapy (weight loss) and tolerance to medication periodically.  Engage in shared decision-making regarding referral to bariatric surgeon for consultation and evaluation when weight-loss goal has not been accomplished by behavioral therapy with or without pharmacologic therapy.   Notes:         This is a list of the screening recommended for you and due dates:  Health Maintenance  Topic Date Due   Mammogram  11/28/2021   COVID-19 Vaccine (3 - Moderna series) 03/07/2022*   Zoster (Shingles) Vaccine (1 of 2)  08/03/2022*   Flu Shot  02/27/2022   Colon Cancer Screening  01/09/2023   Tetanus Vaccine  05/22/2025   Pneumonia Vaccine  Completed   DEXA scan (bone density measurement)  Completed   Hepatitis C Screening: USPSTF Recommendation to screen - Ages 51-79 yo.  Completed   HPV Vaccine  Aged Out  *Topic was postponed. The date shown is not the original due date.    Advanced directives: None  Conditions/risks identified: None  Next appointment: Follow up in one year for your annual wellness visit     Preventive Care 65 Years and Older, Female Preventive care refers to lifestyle choices and visits with your health care provider that can promote health and wellness. What does preventive care include? A yearly physical exam. This is also called an annual well check. Dental exams once or twice a year. Routine eye exams. Ask your health care provider how often you should have your eyes checked. Personal lifestyle choices, including: Daily care of your teeth and gums. Regular physical activity. Eating a healthy diet. Avoiding tobacco and drug use. Limiting alcohol use. Practicing safe sex. Taking low-dose aspirin every day. Taking vitamin and mineral supplements as recommended by your health care provider. What happens during an annual well check? The services and screenings done by your health care provider during your annual well check will depend on your age, overall health, lifestyle risk factors, and family history of disease. Counseling  Your health care provider may ask you questions about your: Alcohol use. Tobacco use. Drug use. Emotional well-being. Home and relationship well-being. Sexual activity. Eating habits. History of falls. Memory and ability to understand (cognition). Work and work Statistician. Reproductive health. Screening  You may have the following tests or measurements: Height, weight, and BMI. Blood pressure. Lipid and cholesterol levels. These may be  checked every 5 years, or more frequently if you are over 35 years old. Skin check. Lung cancer screening. You may have this screening every year starting at age 20 if you have a 30-pack-year history of smoking and currently smoke or have quit within the past 15 years. Fecal occult blood test (FOBT) of the stool. You may have this test every year starting at age 52. Flexible sigmoidoscopy or colonoscopy. You may have a sigmoidoscopy every 5 years or a colonoscopy every 10 years starting at age 47. Hepatitis C blood test. Hepatitis B blood test. Sexually transmitted disease (STD) testing. Diabetes screening. This is done by checking your blood sugar (glucose) after you have not eaten for a while (fasting). You may have this done every 1-3 years. Bone density scan. This is done to screen for osteoporosis. You may have this done starting at age 5. Mammogram. This may be done every 1-2 years. Talk to your health care provider about how often you should have regular mammograms. Talk with your health care provider about your test results, treatment options, and if necessary, the need for more tests.  Vaccines  Your health care provider may recommend certain vaccines, such as: Influenza vaccine. This is recommended every year. Tetanus, diphtheria, and acellular pertussis (Tdap, Td) vaccine. You may need a Td booster every 10 years. Zoster vaccine. You may need this after age 52. Pneumococcal 13-valent conjugate (PCV13) vaccine. One dose is recommended after age 89. Pneumococcal polysaccharide (PPSV23) vaccine. One dose is recommended after age 50. Talk to your health care provider about which screenings and vaccines you need and how often you need them. This information is not intended to replace advice given to you by your health care provider. Make sure you discuss any questions you have with your health care provider. Document Released: 08/12/2015 Document Revised: 04/04/2016 Document Reviewed:  05/17/2015 Elsevier Interactive Patient Education  2017 [place] Prevention in the Home Falls can cause injuries. They can happen to people of all ages. There are many things you can do to make your home safe and to help prevent falls. What can I do on the outside of my home? Regularly fix the edges of walkways and driveways and fix any cracks. Remove anything that might make you trip as you walk through a door, such as a raised step or threshold. Trim any bushes or trees on the path to your home. Use bright outdoor lighting. Clear any walking paths of anything that might make someone trip, such as rocks or tools. Regularly check to see if handrails are loose or broken. Make sure that both sides of any steps have handrails. Any raised decks and porches should have guardrails on the edges. Have any leaves, snow, or ice cleared regularly. Use sand or salt on walking paths during winter. Clean up any spills in your garage right away. This includes oil or grease spills. What can I do in the bathroom? Use night lights. Install grab bars by the toilet and in the tub and shower. Do not use towel bars as grab bars. Use non-skid mats or decals in the tub or shower. If you need to sit down in the shower, use a plastic, non-slip stool. Keep the floor dry. Clean up any water that spills on the floor as soon as it happens. Remove soap buildup in the tub or shower regularly. Attach bath mats securely with double-sided non-slip rug tape. Do not have throw rugs and other things on the floor that can make you trip. What can I do in the bedroom? Use night lights. Make sure that you have a light by your bed that is easy to reach. Do not use any sheets or blankets that are too big for your bed. They should not hang down onto the floor. Have a firm chair that has side arms. You can use this for support while you get dressed. Do not have throw rugs and other things on the floor that can make you  trip. What can I do in the kitchen? Clean up any spills right away. Avoid walking on wet floors. Keep items that you use a lot in easy-to-reach places. If you need to reach something above you, use a strong step stool that has a grab bar. Keep electrical cords out of the way. Do not use floor polish or wax that makes floors slippery. If you must use wax, use non-skid floor wax. Do not have throw rugs and other things on the floor that can make you trip. What can I do with my stairs? Do not leave any items on the stairs. Make sure that  there are handrails on both sides of the stairs and use them. Fix handrails that are broken or loose. Make sure that handrails are as long as the stairways. Check any carpeting to make sure that it is firmly attached to the stairs. Fix any carpet that is loose or worn. Avoid having throw rugs at the top or bottom of the stairs. If you do have throw rugs, attach them to the floor with carpet tape. Make sure that you have a light switch at the top of the stairs and the bottom of the stairs. If you do not have them, ask someone to add them for you. What else can I do to help prevent falls? Wear shoes that: Do not have high heels. Have rubber bottoms. Are comfortable and fit you well. Are closed at the toe. Do not wear sandals. If you use a stepladder: Make sure that it is fully opened. Do not climb a closed stepladder. Make sure that both sides of the stepladder are locked into place. Ask someone to hold it for you, if possible. Clearly mark and make sure that you can see: Any grab bars or handrails. First and last steps. Where the edge of each step is. Use tools that help you move around (mobility aids) if they are needed. These include: Canes. Walkers. Scooters. Crutches. Turn on the lights when you go into a dark area. Replace any light bulbs as soon as they burn out. Set up your furniture so you have a clear path. Avoid moving your furniture  around. If any of your floors are uneven, fix them. If there are any pets around you, be aware of where they are. Review your medicines with your doctor. Some medicines can make you feel dizzy. This can increase your chance of falling. Ask your doctor what other things that you can do to help prevent falls. This information is not intended to replace advice given to you by your health care provider. Make sure you discuss any questions you have with your health care provider. Document Released: 05/12/2009 Document Revised: 12/22/2015 Document Reviewed: 08/20/2014 Elsevier Interactive Patient Education  2017 [employer].

## 2022-02-19 NOTE — Progress Notes (Signed)
Subjective:   Jennifer Davila is a 66 y.o. female who presents for Medicare Annual (Subsequent) preventive examination.  Review of Systems    Virtual Visit via Telephone Note  I connected with  Jennifer Davila on 02/19/22 at  2:00 PM EDT by telephone and verified that I am speaking with the correct person using two identifiers.  Location: Patient: Home Provider: Office Persons participating in the virtual visit: patient/Nurse Health Advisor   I discussed the limitations, risks, security and privacy concerns of performing an evaluation and management service by telephone and the availability of in person appointments. The patient expressed understanding and agreed to proceed.  Interactive audio and video telecommunications were attempted between this nurse and patient, however failed, due to patient having technical difficulties OR patient did not have access to video capability.  We continued and completed visit with audio only.  Some vital signs may be absent or patient reported.   Jennifer Peaches, LPN  Cardiac Risk Factors include: advanced age (>66mn, >>66women);hypertension     Objective:    Today's Vitals   02/19/22 1418  Weight: 245 lb (111.1 kg)  Height: '5\' 1"'$  (1.549 m)   Body mass index is 46.29 kg/m.     02/19/2022    2:25 PM 07/18/2021    8:14 PM 07/18/2021    1:15 PM 02/08/2021   10:32 AM 12/19/2020    2:05 PM 06/26/2018    9:08 AM 05/14/2018    9:10 AM  Advanced Directives  Does Patient Have a Medical Advance Directive? No No No No No No Yes  Type of Advance Directive       Living will  Would patient like information on creating a medical advance directive? No - Patient declined No - Patient declined  No - Patient declined No - Patient declined No - Patient declined     Current Medications (verified) Outpatient Encounter Medications as of 02/19/2022  Medication Sig   acetaminophen (TYLENOL) 500 MG tablet Take 1-2 tablets (500-1,000 mg total) by  mouth daily as needed for moderate pain (pain).   amLODipine (NORVASC) 5 MG tablet Take 0.5 tablets (2.5 mg total) by mouth daily. (Patient taking differently: Take 5 mg by mouth daily.)   calcium carbonate (OSCAL) 1500 (600 Ca) MG TABS tablet Take 1 tablet by mouth 2 (two) times daily with a meal.   Cholecalciferol 50 MCG (2000 UT) CAPS Take 1 capsule by mouth daily.   ibuprofen (ADVIL) 400 MG tablet TAKE 1 TABLET (400 MG TOTAL) BY MOUTH DAILY AS NEEDED.   losartan (COZAAR) 100 MG tablet TAKE 1 TABLET BY MOUTH EVERY DAY   omeprazole (PRILOSEC) 40 MG capsule Take 1 capsule (40 mg total) by mouth daily.   polyethylene glycol (MIRALAX / GLYCOLAX) 17 g packet Take 17 g by mouth every other day.   potassium chloride (KLOR-CON) 10 MEQ tablet TAKE 1 TABLET BY MOUTH EVERY DAY   pravastatin (PRAVACHOL) 20 MG tablet TAKE 1 TABLET BY MOUTH EVERY DAY   pregabalin (LYRICA) 25 MG capsule Take 1 capsule (25 mg total) by mouth at bedtime.   spironolactone (ALDACTONE) 25 MG tablet TAKE 1 TABLET (25 MG TOTAL) BY MOUTH DAILY.   No facility-administered encounter medications on file as of 02/19/2022.    Allergies (verified) Patient has no known allergies.   History: Past Medical History:  Diagnosis Date   Arthritis    Constipation    on stool softener   GERD (gastroesophageal reflux disease)  Hyperlipidemia    Hypertension    Trouble swallowing 2013   following lap band   Urticaria    Past Surgical History:  Procedure Laterality Date   ABDOMINAL HYSTERECTOMY     BREAST BIOPSY Left 11/04/2019    BENIGN LYMPH NODE WITH PARACORTICAL HYPERPLASIA   CHOLECYSTECTOMY     COLONOSCOPY N/A 01/08/2018   Procedure: COLONOSCOPY;  Surgeon: Yetta Flock, MD;  Location: [facility];  Service: Gastroenterology;  Laterality: N/A;   LAPAROSCOPIC GASTRIC BANDING     LAPAROSCOPIC REPAIR AND REMOVAL OF GASTRIC BAND  2013-2014   Family History  Problem Relation Age of Onset   Hypertension Mother     Stroke Mother    Diabetes Father    Alcohol abuse Father    Kidney disease Father    Colon polyps Brother    Cancer Brother    Alcohol abuse Brother    Social History   Socioeconomic History   Marital status: Married    Spouse name: Not on file   Number of children: 1   Years of education: Not on file   Highest education level: Not on file  Occupational History   Not on file  Tobacco Use   Smoking status: Former    Types: Cigarettes    Quit date: 10/02/1972    Years since quitting: 49.4   Smokeless tobacco: Never  Vaping Use   Vaping Use: Never used  Substance and Sexual Activity   Alcohol use: No   Drug use: No   Sexual activity: Never  Other Topics Concern   Not on file  Social History Narrative   Not on file   Social Determinants of Health   Financial Resource Strain: Low Risk  (02/19/2022)   Overall Financial Resource Strain (CARDIA)    Difficulty of Paying Living Expenses: Not hard at all  Food Insecurity: No Food Insecurity (02/19/2022)   Hunger Vital Sign    Worried About Running Out of Food in the Last Year: Never true    Ran Out of Food in the Last Year: Never true  Transportation Needs: No Transportation Needs (02/19/2022)   PRAPARE - Hydrologist (Medical): No    Lack of Transportation (Non-Medical): No  Physical Activity: Inactive (02/19/2022)   Exercise Vital Sign    Days of Exercise per Week: 0 days    Minutes of Exercise per Session: 0 min  Stress: No Stress Concern Present (02/19/2022)   [place]    Feeling of Stress : Not at all  Social Connections: [place] (02/19/2022)   Social Connection and Isolation Panel [NHANES]    Frequency of Communication with Friends and Family: More than three times a week    Frequency of Social Gatherings with Friends and Family: More than three times a week    Attends Religious Services: More than 4 times per year     Active Member of [employer] or Organizations: Yes    Attends Music therapist: More than 4 times per year    Marital Status: Married    Tobacco Counseling Counseling given: Not Answered   Clinical Intake:  Pre-visit preparation completed: No Diabetic? No  Interpreter Needed?: No Activities of Daily Living    02/19/2022    2:24 PM 07/18/2021    8:16 PM  In your present state of health, do you have any difficulty performing the following activities:  Hearing? 0   Vision? 0  Difficulty concentrating or making decisions? 0   Walking or climbing stairs? 0   Dressing or bathing? 0   Doing errands, shopping? 0 0  Preparing Food and eating ? N   Using the Toilet? N   In the past six months, have you accidently leaked urine? N   Do you have problems with loss of bowel control? N   Managing your Medications? N   Managing your Finances? N   Housekeeping or managing your Housekeeping? N     Patient Care Team: [place], [name], MD as PCP - General (Family Medicine) Viona Gilmore, [facility] as Pharmacist (Pharmacist)  Indicate any recent Medical Services you may have received from other than [facility] providers in the past year (date may be approximate).     Assessment:   This is a routine wellness examination for [place].  Hearing/Vision screen Hearing Screening - Comments:: No hearing difficulty Vision Screening - Comments:: Wears glasses  Dietary issues and exercise activities discussed: Exercise limited by: None identified   Goals Addressed               This Visit's Progress     No current goals (pt-stated)         Depression Screen    02/19/2022    2:21 PM 02/12/2022   10:51 AM 12/19/2020    2:07 PM 12/19/2020    2:03 PM 08/27/2019    6:37 PM 10/18/2017    8:42 AM 05/29/2016    8:19 AM  PHQ 2/9 Scores  PHQ - 2 Score 0 0 0 0 0 0 0    Fall Risk    02/19/2022    2:24 PM 02/12/2022   10:51 AM 05/30/2021    1:56 PM 12/19/2020    2:06 PM 05/29/2016     8:19 AM  [place] in the past year? 0 0 0 0 No  Number falls in past yr: 0 0 0 0   Injury with Fall? 0 0 0 0   Risk for fall due to : No Fall Risks History of fall(s)  No Fall Risks   Follow up  Falls evaluation completed Education provided Falls evaluation completed     [place]:  Any stairs in or around the home? Yes  If so, are there any without handrails? No  Home free of loose throw rugs in walkways, pet beds, electrical cords, etc? Yes  Adequate lighting in your home to reduce risk of falls? Yes   ASSISTIVE DEVICES UTILIZED TO PREVENT FALLS:  Life alert? No  Use of a cane, walker or w/c? No  Grab bars in the bathroom? No  Shower chair or bench in shower? No  Elevated toilet seat or a handicapped toilet? No   TIMED UP AND GO:  Was the test performed? No . Audio Visit Cognitive Function:        02/19/2022    2:25 PM  6CIT Screen  What Year? 0 points  What month? 0 points  What time? 0 points  Count back from 20 0 points  Months in reverse 0 points  Repeat phrase 0 points  Total Score 0 points    Immunizations Immunization History  Administered Date(s) Administered   Fluad Quad(high Dose 65+) 06/15/2020, 05/30/2021   Hepatitis B, adult 10/13/2019, 11/17/2019, 05/18/2020   Influenza, High Dose Seasonal PF 04/29/2019   Influenza,inj,Quad PF,6+ Mos 05/23/2015, 05/03/2016, 04/23/2018   Moderna Sars-Covid-2 Vaccination 01/08/2020, 02/05/2020   PPD  Test 05/14/2018   Pneumococcal Conjugate-13 08/04/2015   Pneumococcal Polysaccharide-23 08/26/2019   Tdap 05/23/2015    TDAP status: Up to date  Flu Vaccine status: Up to date  Pneumococcal vaccine status: Up to date  Covid-19 vaccine status: Completed vaccines   Screening Tests Health Maintenance  Topic Date Due   MAMMOGRAM  11/28/2021   COVID-19 Vaccine (3 - Moderna series) 03/07/2022 (Originally 04/01/2020)   Zoster Vaccines- Shingrix (1 of 2) 08/03/2022  (Originally 10/25/2003)   INFLUENZA VACCINE  02/27/2022   COLONOSCOPY (Pts 45-7yr Insurance coverage will need to be confirmed)  01/09/2023   TETANUS/TDAP  05/22/2025   Pneumonia Vaccine 68 Years old  Completed   DEXA SCAN  Completed   Hepatitis C Screening  Completed   HPV VACCINES  Aged Out    Health Maintenance  Health Maintenance Due  Topic Date Due   MAMMOGRAM  11/28/2021    Colorectal cancer screening: Type of screening: Colonoscopy. Completed 01/08/18. Repeat every 5 years  Mammogram status: Ordered Scheduled 05/09/22. Pt provided with contact info and advised to call to schedule appt.   Bone Density status: Ordered Scheduled 05/09/22. Pt provided with contact info and advised to call to schedule appt.  Lung Cancer Screening: (Low Dose CT Chest recommended if Age 68-80years, 30 pack-year currently smoking OR have quit w/in 15years.) does not qualify.     Additional Screening:  Hepatitis C Screening: does qualify; Completed 10/31/15  Vision Screening: Recommended annual ophthalmology exams for early detection of glaucoma and other disorders of the eye. Is the patient up to date with their annual eye exam?  Yes  Who is the provider or what is the name of the office in which the patient attends annual eye exams? Patient deferred If pt is not established with a provider, would they like to be referred to a provider to establish care? No .   Dental Screening: Recommended annual dental exams for proper oral hygiene  Community Resource Referral / Chronic Care Management:  CRR required this visit?  No   CCM required this visit?  No      Plan:     I have personally reviewed and noted the following in the patient's chart:   Medical and social history Use of alcohol, tobacco or illicit drugs  Current medications and supplements including opioid prescriptions.  Functional ability and status Nutritional status Physical activity Advanced directives List of other  physicians Hospitalizations, surgeries, and ER visits in previous 12 months Vitals Screenings to include cognitive, depression, and falls Referrals and appointments  In addition, I have reviewed and discussed with patient certain preventive protocols, quality metrics, and best practice recommendations. A written personalized care plan for preventive services as well as general preventive health recommendations were provided to patient.     BCriselda Peaches LPN   [id number]  Nurse Notes: None

## 2022-02-27 ENCOUNTER — Other Ambulatory Visit: Payer: No Typology Code available for payment source

## 2022-02-28 ENCOUNTER — Ambulatory Visit (HOSPITAL_COMMUNITY)
Admission: RE | Admit: 2022-02-28 | Discharge: 2022-02-28 | Disposition: A | Discharge status: Home / Self Care | Payer: No Typology Code available for payment source | Source: Ambulatory Visit | Attending: Cardiovascular Disease | Admitting: Cardiovascular Disease

## 2022-02-28 DIAGNOSIS — M79604 Pain in right leg: Secondary | ICD-10-CM

## 2022-02-28 DIAGNOSIS — M79605 Pain in left leg: Secondary | ICD-10-CM

## 2022-03-02 ENCOUNTER — Telehealth: Payer: Self-pay | Admitting: Pharmacist

## 2022-03-02 NOTE — Chronic Care Management (AMB) (Signed)
Chronic Care Management Pharmacy Assistant   Name: Jennifer Davila  MRN: [id number] DOB: 18-Mar-1956  Reason for Encounter: Disease State / Hypertension Assessment Call   Conditions to be addressed/monitored: HTN  Recent office visits:  02/19/2022 Rolene Arbour LPN - Medicare annual wellness exam  02/12/2022 [name] MD - Patient was seen for Pain in both lower extremities and additional issues. Started Omeprazole 40 mg and Pregabalin 25 mg. Discontinued Nexium and Gabapentin  Recent consult visits:  None  Hospital visits:  None  Medications: Outpatient Encounter Medications as of 03/02/2022  Medication Sig   acetaminophen (TYLENOL) 500 MG tablet Take 1-2 tablets (500-1,000 mg total) by mouth daily as needed for moderate pain (pain).   amLODipine (NORVASC) 5 MG tablet Take 0.5 tablets (2.5 mg total) by mouth daily. (Patient taking differently: Take 5 mg by mouth daily.)   calcium carbonate (OSCAL) 1500 (600 Ca) MG TABS tablet Take 1 tablet by mouth 2 (two) times daily with a meal.   Cholecalciferol 50 MCG (2000 UT) CAPS Take 1 capsule by mouth daily.   ibuprofen (ADVIL) 400 MG tablet TAKE 1 TABLET (400 MG TOTAL) BY MOUTH DAILY AS NEEDED.   losartan (COZAAR) 100 MG tablet TAKE 1 TABLET BY MOUTH EVERY DAY   omeprazole (PRILOSEC) 40 MG capsule Take 1 capsule (40 mg total) by mouth daily.   polyethylene glycol (MIRALAX / GLYCOLAX) 17 g packet Take 17 g by mouth every other day.   potassium chloride (KLOR-CON) 10 MEQ tablet TAKE 1 TABLET BY MOUTH EVERY DAY   pravastatin (PRAVACHOL) 20 MG tablet TAKE 1 TABLET BY MOUTH EVERY DAY   pregabalin (LYRICA) 25 MG capsule Take 1 capsule (25 mg total) by mouth at bedtime.   spironolactone (ALDACTONE) 25 MG tablet TAKE 1 TABLET (25 MG TOTAL) BY MOUTH DAILY.   No facility-administered encounter medications on file as of 03/02/2022.  Fill History: SPIRONOLACTONE 25 MG TABLET 12/22/2021 90   PREGABALIN '25MG'$  CAP 02/12/2022 30    PRAVASTATIN SODIUM 20 MG TAB 12/22/2021 90   POTASSIUM CL ER 10 MEQ TABLET 01/08/2022 90   OMEPRAZOLE '40MG'$  CAP 02/12/2022 90   LOSARTAN POTASSIUM 100 MG TAB 01/08/2022 90   IBUPROFEN 400 MG TABLET 02/05/2022 30   AMLODIPINE BESYLATE 5 MG TAB 03/31/2021 90   [facility] ACETAMINOPHEN 500 MG CPLT 03/22/2021 30   Reviewed chart prior to disease state call. Spoke with patient regarding BP  Recent Office Vitals: BP Readings from Last 3 Encounters:  02/12/22 134/80  11/08/21 (!) 150/84  11/01/21 132/80   Pulse Readings from Last 3 Encounters:  02/12/22 81  11/08/21 87  11/01/21 81    Wt Readings from Last 3 Encounters:  02/19/22 245 lb (111.1 kg)  02/12/22 245 lb 8 oz (111.4 kg)  11/08/21 244 lb (110.7 kg)     Kidney Function Lab Results  Component Value Date/Time   CREATININE 0.95 08/01/2021 11:31 AM   CREATININE 1.02 (H) 07/19/2021 04:36 AM   CREATININE 1.03 (H) 05/03/2016 11:52 AM   CREATININE 0.93 10/31/2015 10:30 AM   GFR 61.88 08/01/2021 11:31 AM   GFRNONAA >60 07/19/2021 04:36 AM   GFRNONAA 58 (L) 05/03/2016 11:52 AM   GFRAA 68 04/19/2020 10:51 AM   GFRAA 67 05/03/2016 11:52 AM       Latest Ref Rng & Units 11/01/2021   10:40 AM 08/01/2021   11:31 AM 07/19/2021    4:36 AM  BMP  Glucose 70 - 99 mg/dL  100  100  BUN 6 - 23 mg/dL  13  15   Creatinine 0.40 - 1.20 mg/dL  0.95  1.02   Sodium 135 - 145 mEq/L  139  137   Potassium 3.5 - 5.1 mEq/L 3.7  3.5  3.5   Chloride 96 - 112 mEq/L  101  104   CO2 19 - 32 mEq/L  30  24   Calcium 8.4 - 10.5 mg/dL  9.1  9.1     Current antihypertensive regimen:  Amlodipine 5 mg 1/2 tablet daily Losartan 100 mg daily Spironolactone 25 mg daily  How often are you checking your Blood Pressure? Patient is not currently checking her blood pressures, she states she has a lot on her plate at this time due to her husband having cancer. She will start checking again when things settle down.   Current home BP readings: No recent blood  pressures.   What recent interventions/DTPs have been made by any provider to improve Blood Pressure control since last CPP Visit: No recent interventions  Any recent hospitalizations or ED visits since last visit with CPP? No recent hospital visits.   What diet changes have been made to improve Blood Pressure Control?  Patient is not following any specific diet. Breakfast - patient will have toast. Lunch - patient does not eat lunch Dinner - patient will have a meal that contains meat and vegetables with a salad  What exercise is being done to improve your Blood Pressure Control?  Patient states she does a lot of walking around the house and housework  Adherence Review: Is the patient currently on ACE/ARB medication? Yes Does the patient have >5 day gap between last estimated fill dates? No  Care Gaps: AWV - scheduled 02/22/2023 Last BP - 134/80 on 02/12/2022 Mammogram - overdue Flu - due Covid booster - postponed Shingrix - postponed  Star Rating Drugs: Pravastatin 20 mg - last filled 12/22/2021 [facility] Losartan 100 mg - last filled 01/08/2022 90 DS at [facility] Pharmacist Assistant [phone]

## 2022-03-06 ENCOUNTER — Other Ambulatory Visit: Payer: Self-pay

## 2022-03-06 ENCOUNTER — Emergency Department (HOSPITAL_COMMUNITY)
Admission: EM | Admit: 2022-03-06 | Discharge: 2022-03-07 | Disposition: A | Discharge status: Home / Self Care | Payer: No Typology Code available for payment source | Attending: Emergency Medicine | Admitting: Emergency Medicine

## 2022-03-06 DIAGNOSIS — N132 Hydronephrosis with renal and ureteral calculous obstruction: Secondary | ICD-10-CM | POA: Insufficient documentation

## 2022-03-06 DIAGNOSIS — R109 Unspecified abdominal pain: Secondary | ICD-10-CM | POA: Diagnosis present

## 2022-03-06 DIAGNOSIS — N200 Calculus of kidney: Secondary | ICD-10-CM

## 2022-03-06 DIAGNOSIS — K5730 Diverticulosis of large intestine without perforation or abscess without bleeding: Secondary | ICD-10-CM | POA: Diagnosis not present

## 2022-03-06 DIAGNOSIS — Z90710 Acquired absence of both cervix and uterus: Secondary | ICD-10-CM | POA: Diagnosis not present

## 2022-03-06 DIAGNOSIS — Z9049 Acquired absence of other specified parts of digestive tract: Secondary | ICD-10-CM | POA: Diagnosis not present

## 2022-03-06 NOTE — ED Triage Notes (Signed)
Patient coming to ED for evaluation of R flank pain.  Reports symptoms started today.  No hx of kidney stones.  No urinary symptoms.  Has had nausea and vomiting.

## 2022-03-07 ENCOUNTER — Encounter (HOSPITAL_COMMUNITY): Payer: Self-pay

## 2022-03-07 ENCOUNTER — Emergency Department (HOSPITAL_COMMUNITY): Payer: No Typology Code available for payment source

## 2022-03-07 DIAGNOSIS — Z90710 Acquired absence of both cervix and uterus: Secondary | ICD-10-CM | POA: Diagnosis not present

## 2022-03-07 DIAGNOSIS — Z9049 Acquired absence of other specified parts of digestive tract: Secondary | ICD-10-CM | POA: Diagnosis not present

## 2022-03-07 DIAGNOSIS — N132 Hydronephrosis with renal and ureteral calculous obstruction: Secondary | ICD-10-CM | POA: Diagnosis not present

## 2022-03-07 DIAGNOSIS — K5730 Diverticulosis of large intestine without perforation or abscess without bleeding: Secondary | ICD-10-CM | POA: Diagnosis not present

## 2022-03-07 LAB — CBC WITH DIFFERENTIAL/PLATELET
Abs Immature Granulocytes: 0.02 10*3/uL (ref 0.00–0.07)
Basophils Absolute: 0 10*3/uL (ref 0.0–0.1)
Basophils Relative: 0 %
Eosinophils Absolute: 0 10*3/uL (ref 0.0–0.5)
Eosinophils Relative: 0 %
HCT: 42.4 % (ref 36.0–46.0)
Hemoglobin: 12.9 g/dL (ref 12.0–15.0)
Immature Granulocytes: 0 %
Lymphocytes Relative: 7 %
Lymphs Abs: 0.7 10*3/uL (ref 0.7–4.0)
MCH: 23 pg — ABNORMAL LOW (ref 26.0–34.0)
MCHC: 30.4 g/dL (ref 30.0–36.0)
MCV: 75.6 fL — ABNORMAL LOW (ref 80.0–100.0)
Monocytes Absolute: 0.3 10*3/uL (ref 0.1–1.0)
Monocytes Relative: 3 %
Neutro Abs: 9.5 10*3/uL — ABNORMAL HIGH (ref 1.7–7.7)
Neutrophils Relative %: 90 %
Platelets: 341 10*3/uL (ref 150–400)
RBC: 5.61 MIL/uL — ABNORMAL HIGH (ref 3.87–5.11)
RDW: 14.6 % (ref 11.5–15.5)
WBC: 10.6 10*3/uL — ABNORMAL HIGH (ref 4.0–10.5)
nRBC: 0 % (ref 0.0–0.2)

## 2022-03-07 LAB — URINALYSIS, ROUTINE W REFLEX MICROSCOPIC
Bilirubin Urine: NEGATIVE
Glucose, UA: NEGATIVE mg/dL
Ketones, ur: NEGATIVE mg/dL
Leukocytes,Ua: NEGATIVE
Nitrite: NEGATIVE
Protein, ur: 30 mg/dL — AB
RBC / HPF: >50 RBC/hpf — ABNORMAL HIGH (ref 0–5)
Specific Gravity, Urine: 1.016 (ref 1.005–1.030)
pH: 6 (ref 5.0–8.0)

## 2022-03-07 LAB — BASIC METABOLIC PANEL
Anion gap: 9 (ref 5–15)
BUN: 12 mg/dL (ref 8–23)
CO2: 24 mmol/L (ref 22–32)
Calcium: 8.9 mg/dL (ref 8.9–10.3)
Chloride: 104 mmol/L (ref 98–111)
Creatinine, Ser: 1.17 mg/dL — ABNORMAL HIGH (ref 0.44–1.00)
GFR, Estimated: 51 mL/min — ABNORMAL LOW (ref >60)
Glucose, Bld: 167 mg/dL — ABNORMAL HIGH (ref 70–99)
Potassium: 3.8 mmol/L (ref 3.5–5.1)
Sodium: 137 mmol/L (ref 135–145)

## 2022-03-07 MED ORDER — TAMSULOSIN HCL 0.4 MG PO CAPS
0.4000 mg | ORAL_CAPSULE | ORAL | Status: AC
  Administered 2022-03-07: 0.4 mg via ORAL
  Filled 2022-03-07: qty 1

## 2022-03-07 MED ORDER — TAMSULOSIN HCL 0.4 MG PO CAPS: 0.4000 mg | ORAL_CAPSULE | Freq: Every day | ORAL | 0 refills | Status: DC

## 2022-03-07 MED ORDER — KETOROLAC TROMETHAMINE 30 MG/ML IJ SOLN
30.0000 mg | Freq: Once | INTRAMUSCULAR | Status: AC
  Administered 2022-03-07: 30 mg via INTRAVENOUS
  Filled 2022-03-07: qty 1

## 2022-03-07 MED ORDER — ONDANSETRON 8 MG PO TBDP: ORAL_TABLET | ORAL | 0 refills | Status: DC

## 2022-03-07 MED ORDER — DICLOFENAC SODIUM ER 100 MG PO TB24: 100.0000 mg | ORAL_TABLET | Freq: Every day | ORAL | 0 refills | Status: DC

## 2022-03-07 MED ORDER — MAGNESIUM SULFATE 2 GM/50ML IV SOLN
2.0000 g | Freq: Once | INTRAVENOUS | Status: AC
Start: 2022-03-07 — End: 2022-03-07
  Administered 2022-03-07: 2 g via INTRAVENOUS
  Filled 2022-03-07: qty 50

## 2022-03-07 MED ORDER — TRAMADOL HCL 50 MG PO TABS: 50.0000 mg | ORAL_TABLET | Freq: Four times a day (QID) | ORAL | 0 refills | Status: DC | PRN

## 2022-03-07 NOTE — ED Provider Notes (Signed)
[facility] DEPT Provider Note   CSN: [id number] Arrival date & time: 03/06/22  1811     History  Chief Complaint  Patient presents with   Flank Pain    Jennifer Davila is a 66 y.o. female.  The history is provided by the patient.  Flank Pain This is a new problem. The current episode started 6 to 12 hours ago. The problem occurs constantly. The problem has not changed since onset.Pertinent negatives include no chest pain, no headaches and no shortness of breath. Nothing aggravates the symptoms. Nothing relieves the symptoms. She has tried nothing for the symptoms. The treatment provided no relief.       Home Medications Prior to Admission medications   Medication Sig Start Date End Date Taking? Authorizing Provider  acetaminophen (TYLENOL) 500 MG tablet Take 1-2 tablets (500-1,000 mg total) by mouth daily as needed for moderate pain (pain). 03/22/21  Yes Newt Minion, MD  amLODipine (NORVASC) 5 MG tablet Take 0.5 tablets (2.5 mg total) by mouth daily. Patient taking differently: Take 5 mg by mouth daily. 07/19/21  Yes Danford, Suann Larry, MD  calcium carbonate (OSCAL) 1500 (600 Ca) MG TABS tablet Take 1 tablet by mouth 2 (two) times daily with a meal.   Yes [provider]  Cholecalciferol 50 MCG (2000 UT) CAPS Take 2,000 Units by mouth daily.   Yes [provider]  Diclofenac Sodium CR 100 MG 24 hr tablet Take 1 tablet (100 mg total) by mouth daily. 03/07/22  Yes Ryett Hamman, MD  ibuprofen (ADVIL) 400 MG tablet TAKE 1 TABLET (400 MG TOTAL) BY MOUTH DAILY AS NEEDED. Patient taking differently: Take 400 mg by mouth daily as needed for moderate pain. 02/05/22  Yes Martinique, [name], MD  losartan (COZAAR) 100 MG tablet TAKE 1 TABLET BY MOUTH EVERY DAY Patient taking differently: Take 100 mg by mouth daily. 01/08/22  Yes Martinique, [name], MD  Magnesium 250 MG TABS Take 250 mg by mouth daily.   Yes [provider]   omeprazole (PRILOSEC) 40 MG capsule Take 1 capsule (40 mg total) by mouth daily. 02/12/22  Yes Martinique, [name], MD  ondansetron (ZOFRAN-ODT) 8 MG disintegrating tablet '8mg'$  ODT q8 hours prn nausea 03/07/22  Yes Franki Stemen, MD  polyethylene glycol (MIRALAX / GLYCOLAX) 17 g packet Take 17 g by mouth every other day.   Yes [provider]  potassium chloride (KLOR-CON) 10 MEQ tablet TAKE 1 TABLET BY MOUTH EVERY DAY Patient taking differently: Take 10 mEq by mouth daily. 01/08/22  Yes Martinique, [name], MD  pregabalin (LYRICA) 25 MG capsule Take 1 capsule (25 mg total) by mouth at bedtime. 02/12/22  Yes Martinique, [name], MD  spironolactone (ALDACTONE) 25 MG tablet TAKE 1 TABLET (25 MG TOTAL) BY MOUTH DAILY. 09/26/21  Yes Martinique, [name], MD  tamsulosin (FLOMAX) 0.4 MG CAPS capsule Take 1 capsule (0.4 mg total) by mouth daily. 03/07/22  Yes [name], MD  traMADol (ULTRAM) 50 MG tablet Take 1 tablet (50 mg total) by mouth every 6 (six) hours as needed. 03/07/22  Yes Ra Pfiester, MD  pravastatin (PRAVACHOL) 20 MG tablet TAKE 1 TABLET BY MOUTH EVERY DAY Patient not taking: Reported on 03/07/2022 09/26/21   Martinique, [name], MD      Allergies    Patient has no known allergies.    Review of Systems   Review of Systems  Constitutional:  Negative for fever.  Respiratory:  Negative for  shortness of breath.   Cardiovascular:  Negative for chest pain.  Genitourinary:  Positive for flank pain.  Neurological:  Negative for headaches.  All other systems reviewed and are negative.   Physical Exam Updated Vital Signs BP (!) 175/94   Pulse 65   Temp 98.2 F (36.8 C) (Oral)   Resp 14   Ht '5\' 2"'$  (1.575 m)   Wt 111.1 kg   SpO2 97%   BMI 44.81 kg/m  Physical Exam Vitals and nursing note reviewed.  Constitutional:      General: She is not in acute distress.    Appearance: She is well-developed.  HENT:     Head: Normocephalic and atraumatic.     Nose: Nose normal.  Eyes:     Pupils: Pupils  are equal, round, and reactive to light.  Cardiovascular:     Rate and Rhythm: Normal rate and regular rhythm.     Pulses: Normal pulses.     Heart sounds: Normal heart sounds.  Pulmonary:     Effort: Pulmonary effort is normal. No respiratory distress.     Breath sounds: Normal breath sounds.  Abdominal:     General: Bowel sounds are normal. There is no distension.     Palpations: Abdomen is soft.     Tenderness: There is no abdominal tenderness. There is no guarding or rebound.  Genitourinary:    Vagina: No vaginal discharge.  Musculoskeletal:        General: Normal range of motion.     Cervical back: Neck supple.  Skin:    General: Skin is warm and dry.     Capillary Refill: Capillary refill takes less than 2 seconds.     Findings: No erythema or rash.  Neurological:     General: No focal deficit present.     Mental Status: She is oriented to person, place, and time.     Deep Tendon Reflexes: Reflexes normal.  Psychiatric:        Mood and Affect: Mood normal.     ED Results / Procedures / Treatments   Labs (all labs ordered are listed, but only abnormal results are displayed) Results for orders placed or performed during the hospital encounter of 03/06/22  CBC with Differential  Result Value Ref Range   WBC 10.6 (H) 4.0 - 10.5 K/uL   RBC 5.61 (H) 3.87 - 5.11 MIL/uL   Hemoglobin 12.9 12.0 - 15.0 g/dL   HCT 42.4 36.0 - 46.0 %   MCV 75.6 (L) 80.0 - 100.0 fL   MCH 23.0 (L) 26.0 - 34.0 pg   MCHC 30.4 30.0 - 36.0 g/dL   RDW 14.6 11.5 - 15.5 %   Platelets 341 150 - 400 K/uL   nRBC 0.0 0.0 - 0.2 %   Neutrophils Relative % 90 %   Neutro Abs 9.5 (H) 1.7 - 7.7 K/uL   Lymphocytes Relative 7 %   Lymphs Abs 0.7 0.7 - 4.0 K/uL   Monocytes Relative 3 %   Monocytes Absolute 0.3 0.1 - 1.0 K/uL   Eosinophils Relative 0 %   Eosinophils Absolute 0.0 0.0 - 0.5 K/uL   Basophils Relative 0 %   Basophils Absolute 0.0 0.0 - 0.1 K/uL   Immature Granulocytes 0 %   Abs Immature  Granulocytes 0.02 0.00 - 0.07 K/uL  Basic metabolic panel  Result Value Ref Range   Sodium 137 135 - 145 mmol/L   Potassium 3.8 3.5 - 5.1 mmol/L   Chloride 104 98 - 111  mmol/L   CO2 24 22 - 32 mmol/L   Glucose, Bld 167 (H) 70 - 99 mg/dL   BUN 12 8 - 23 mg/dL   Creatinine, Ser 1.17 (H) 0.44 - 1.00 mg/dL   Calcium 8.9 8.9 - 10.3 mg/dL   GFR, Estimated 51 (L) >60 mL/min   Anion gap 9 5 - 15  Urinalysis, Routine w reflex microscopic Urine, Clean Catch  Result Value Ref Range   Color, Urine YELLOW YELLOW   APPearance HAZY (A) CLEAR   Specific Gravity, Urine 1.016 1.005 - 1.030   pH 6.0 5.0 - 8.0   Glucose, UA NEGATIVE NEGATIVE mg/dL   Hgb urine dipstick MODERATE (A) NEGATIVE   Bilirubin Urine NEGATIVE NEGATIVE   Ketones, ur NEGATIVE NEGATIVE mg/dL   Protein, ur 30 (A) NEGATIVE mg/dL   Nitrite NEGATIVE NEGATIVE   Leukocytes,Ua NEGATIVE NEGATIVE   RBC / HPF >50 (H) 0 - 5 RBC/hpf   WBC, UA 0-5 0 - 5 WBC/hpf   Bacteria, UA RARE (A) NONE SEEN   Squamous Epithelial / LPF 0-5 0 - 5   Mucus PRESENT    CT Renal Stone Study  Result Date: 03/07/2022 CLINICAL DATA:  Right flank pain EXAM: CT ABDOMEN AND PELVIS WITHOUT CONTRAST TECHNIQUE: Multidetector CT imaging of the abdomen and pelvis was performed following the standard protocol without IV contrast. RADIATION DOSE REDUCTION: This exam was performed according to the departmental dose-optimization program which includes automated exposure control, adjustment of the mA and/or kV according to patient size and/or use of iterative reconstruction technique. COMPARISON:  02/05/2018 FINDINGS: Lower chest: No acute abnormality Hepatobiliary: No focal liver abnormality is seen. Status post cholecystectomy. No biliary dilatation. Pancreas: No focal abnormality or ductal dilatation. Spleen: No focal abnormality.  Normal size. Adrenals/Urinary Tract: Right hydronephrosis due to to 3 mm proximal right ureteral stone. No stones or hydronephrosis on the left.  Adrenal glands and urinary bladder unremarkable. Stomach/Bowel: Normal appendix. Diffuse colonic diverticulosis. No active diverticulitis. Stomach and small bowel decompressed, unremarkable. Vascular/Lymphatic: No evidence of aneurysm or adenopathy. Reproductive: Prior hysterectomy.  No adnexal masses. Other: No free fluid or free air. Musculoskeletal: No acute bony abnormality. IMPRESSION: 3 mm proximal right ureteral stone with mild right hydronephrosis. Diffuse colonic diverticulosis. Electronically Signed   By: Rolm Baptise M.D.   On: 03/07/2022 01:39   LE ART SEG MULTI (Segm & LE Reynauds)  Result Date: 03/01/2022  LOWER EXTREMITY DOPPLER STUDY Patient Name:  ADORA YEH  Date of Exam:   02/28/2022 Medical Rec #: [id number]           Accession #:    [id number] Date of Birth: 08-24-1955           Patient Gender: F Patient Age:   60 years Exam Location:  [facility] Procedure:      VAS [place] LOWER EXT ART SEG MULTI (SEGMENTALS & LE RAYNAUDS) Referring Phys: [name] --------------------------------------------------------------------------------  High Risk Factors: Hypertension, hyperlipidemia, past history of smoking. Other Factors: Bilateral lower extremity pain for years that is not brought on                by ambulation.  Performing Technologist: Wilkie Aye RVT  Examination Guidelines: A complete evaluation includes at minimum, Doppler waveform signals and systolic blood pressure reading at the level of bilateral brachial, anterior tibial, and posterior tibial arteries, when vessel segments are accessible. Bilateral testing is considered an integral part of a complete examination. Photoelectric Plethysmograph (PPG) waveforms and toe systolic pressure readings  are included as required and additional duplex testing as needed. Limited examinations for reoccurring indications may be performed as noted.  ABI Findings: +---------+------------------+-----+---------+--------+ Right    Rt Pressure  (mmHg)IndexWaveform Comment  +---------+------------------+-----+---------+--------+ Brachial 185                                      +---------+------------------+-----+---------+--------+ CFA                             triphasic         +---------+------------------+-----+---------+--------+ Popliteal                       triphasic         +---------+------------------+-----+---------+--------+ PTA      213               1.13 triphasic         +---------+------------------+-----+---------+--------+ PERO     190               1.01 triphasic         +---------+------------------+-----+---------+--------+ DP       201               1.07 triphasic         +---------+------------------+-----+---------+--------+ Great Toe226               1.20 Normal            +---------+------------------+-----+---------+--------+ +---------+------------------+-----+---------+-------+ Left     Lt Pressure (mmHg)IndexWaveform Comment +---------+------------------+-----+---------+-------+ Brachial 188                                     +---------+------------------+-----+---------+-------+ CFA                             triphasic        +---------+------------------+-----+---------+-------+ Popliteal                       triphasic        +---------+------------------+-----+---------+-------+ PTA      201               1.07 triphasic        +---------+------------------+-----+---------+-------+ PERO     202               1.09 triphasic        +---------+------------------+-----+---------+-------+ DP       202               1.07 triphasic        +---------+------------------+-----+---------+-------+ Great Toe175               0.93 Normal           +---------+------------------+-----+---------+-------+   Summary: Right: Resting right ankle-brachial index is within normal range. No evidence of significant right lower extremity arterial  disease. The right toe-brachial index is normal. Left: Resting left ankle-brachial index is within normal range. No evidence of significant left lower extremity arterial disease. The left toe-brachial index is normal. *See table(s) above for measurements and observations.  Electronically signed by Larae Grooms MD on 03/01/2022 at 5:02:17 PM.    Final       Radiology CT Renal Stone  Study  Result Date: 03/07/2022 CLINICAL DATA:  Right flank pain EXAM: CT ABDOMEN AND PELVIS WITHOUT CONTRAST TECHNIQUE: Multidetector CT imaging of the abdomen and pelvis was performed following the standard protocol without IV contrast. RADIATION DOSE REDUCTION: This exam was performed according to the departmental dose-optimization program which includes automated exposure control, adjustment of the mA and/or kV according to patient size and/or use of iterative reconstruction technique. COMPARISON:  02/05/2018 FINDINGS: Lower chest: No acute abnormality Hepatobiliary: No focal liver abnormality is seen. Status post cholecystectomy. No biliary dilatation. Pancreas: No focal abnormality or ductal dilatation. Spleen: No focal abnormality.  Normal size. Adrenals/Urinary Tract: Right hydronephrosis due to to 3 mm proximal right ureteral stone. No stones or hydronephrosis on the left. Adrenal glands and urinary bladder unremarkable. Stomach/Bowel: Normal appendix. Diffuse colonic diverticulosis. No active diverticulitis. Stomach and small bowel decompressed, unremarkable. Vascular/Lymphatic: No evidence of aneurysm or adenopathy. Reproductive: Prior hysterectomy.  No adnexal masses. Other: No free fluid or free air. Musculoskeletal: No acute bony abnormality. IMPRESSION: 3 mm proximal right ureteral stone with mild right hydronephrosis. Diffuse colonic diverticulosis. Electronically Signed   By: Rolm Baptise M.D.   On: 03/07/2022 01:39    Procedures Procedures    Medications Ordered in ED Medications  ketorolac (TORADOL) 30  MG/ML injection 30 mg (30 mg Intravenous Given 03/07/22 0408)  magnesium sulfate IVPB 2 g 50 mL (0 g Intravenous Stopped 03/07/22 0502)  tamsulosin (FLOMAX) capsule 0.4 mg (0.4 mg Oral Given 03/07/22 0410)    ED Course/ Medical Decision Making/ A&P                           Medical Decision Making R flank pain.    Amount and/or Complexity of Data Reviewed Labs: ordered.    Details: All labs reviewed:  Normal sodium 137, potassium 3.8 elevated creatinine 1.17.  White count no slightly elevated 10.6, normal hemoglobin 12.9.  Urine with hematuria.  No UTi Radiology: ordered and independent interpretation performed.    Details: Kidney stone by me  Risk Prescription drug management. Risk Details: Take pain medication as directed.  Do not drink alcohol or drive a car while taking narcotic pain medication.  Call today to follow up with urology.  Strict return precautions.      Final Clinical Impression(s) / ED Diagnoses Final diagnoses:  Kidney stone   Return for intractable cough, coughing up blood, fevers > 100.4 unrelieved by medication, shortness of breath, intractable vomiting, chest pain, shortness of breath, weakness, numbness, changes in speech, facial asymmetry, abdominal pain, passing out, Inability to tolerate liquids or food, cough, altered mental status or any concerns. No signs of systemic illness or infection. The patient is nontoxic-appearing on exam and vital signs are within normal limits.  I have reviewed the triage vital signs and the nursing notes. Pertinent labs & imaging results that were available during my care of the patient were reviewed by me and considered in my medical decision making (see chart for details). After history, exam, and medical workup I feel the patient has been appropriately medically screened and is safe for discharge home. Pertinent diagnoses were discussed with the patient. Patient was given return precautions.    Rx / DC Orders ED Discharge Orders           Ordered    traMADol (ULTRAM) 50 MG tablet  Every 6 hours PRN        03/07/22 0618    Diclofenac Sodium CR  100 MG 24 hr tablet  Daily        03/07/22 0618    ondansetron (ZOFRAN-ODT) 8 MG disintegrating tablet        03/07/22 0618    tamsulosin (FLOMAX) 0.4 MG CAPS capsule  Daily        03/07/22 0618              Clarissa Laird, MD 03/07/22 4098

## 2022-03-16 DIAGNOSIS — N201 Calculus of ureter: Secondary | ICD-10-CM | POA: Diagnosis not present

## 2022-03-26 NOTE — Progress Notes (Unsigned)
HPI: Jennifer Davila is a 66 y.o. female, who is here today to follow on recent visit. She was last seen on 02/12/22. Years Hx of LE pain. Last visit she agreed with trying Lyrica. She has failed several treatments. *** Lumbar MRI done in 12/2017: L4-5 bilateral facet arthropathy with edema, anterolisthesis, and L5-S1 facet OA. Canal and foraminal stenosis. She was referred to neurosurgeon, did not think lower extremity pain was caused by MRI findings. She has been evaluated by orthopedics and rheumatologist.  Since her last visit she has been evaluated in the ED, 03/06/22, for 6-12 hours of flank pain, Dx''ed with kidney stone. BP was elevated at 175/94. She is on Amlodipine 5 mg 1/2 tab daily, Losartan 100 mg daily, and spironolactone 25 mg daily. Lab Results  Component Value Date   CREATININE 1.17 (H) 03/07/2022   BUN 12 03/07/2022   NA 137 03/07/2022   K 3.8 03/07/2022   CL 104 03/07/2022   CO2 24 03/07/2022   Lab Results  Component Value Date   WBC 10.6 (H) 03/07/2022   HGB 12.9 03/07/2022   HCT 42.4 03/07/2022   MCV 75.6 (L) 03/07/2022   PLT 341 03/07/2022   Urinalysis, Routine w reflex microscopic Urine, Clean Catch  Result Value Ref Range    Color, Urine YELLOW YELLOW    APPearance HAZY (A) CLEAR    Specific Gravity, Urine 1.016 1.005 - 1.030    pH 6.0 5.0 - 8.0    Glucose, UA NEGATIVE NEGATIVE mg/dL    Hgb urine dipstick MODERATE (A) NEGATIVE    Bilirubin Urine NEGATIVE NEGATIVE    Ketones, ur NEGATIVE NEGATIVE mg/dL    Protein, ur 30 (A) NEGATIVE mg/dL    Nitrite NEGATIVE NEGATIVE    Leukocytes,Ua NEGATIVE NEGATIVE    RBC / HPF >50 (H) 0 - 5 RBC/hpf    WBC, UA 0-5 0 - 5 WBC/hpf    Bacteria, UA RARE (A) NONE SEEN    Squamous Epithelial / LPF 0-5 0 - 5    Mucus PRESENT      CT renal stone study:3 mm proximal right ureteral stone with mild right hydronephrosis. Diffuse colonic diverticulosis.  She was started on Flomax 0.4 mg daily.  Review of  Systems  Constitutional:  Positive for fatigue. Negative for activity change, appetite change and fever.  HENT:  Negative for mouth sores, nosebleeds and tinnitus.   Eyes:  Negative for redness and visual disturbance.  Respiratory:  Negative for cough and wheezing.   Cardiovascular:  Positive for leg swelling. Negative for chest pain and palpitations.  Gastrointestinal:  Negative for abdominal pain, nausea and vomiting.       Negative for changes in bowel habits.  Genitourinary:  Negative for decreased urine volume, difficulty urinating, dysuria and hematuria.  Musculoskeletal:  Positive for arthralgias and myalgias.  Neurological:  Negative for syncope and headaches.  Psychiatric/Behavioral:  Positive for sleep disturbance. Negative for confusion. The patient is nervous/anxious.   Rest see pertinent positives and negatives per HPI.  Current Outpatient Medications on File Prior to Visit  Medication Sig Dispense Refill   acetaminophen (TYLENOL) 500 MG tablet Take 1-2 tablets (500-1,000 mg total) by mouth daily as needed for moderate pain (pain). 60 tablet 1   amLODipine (NORVASC) 5 MG tablet Take 0.5 tablets (2.5 mg total) by mouth daily. (Patient taking differently: Take 5 mg by mouth daily.) 90 tablet 1   calcium carbonate (OSCAL) 1500 (600 Ca) MG TABS tablet Take 1 tablet by  mouth 2 (two) times daily with a meal.     Cholecalciferol 50 MCG (2000 UT) CAPS Take 2,000 Units by mouth daily.     Diclofenac Sodium CR 100 MG 24 hr tablet Take 1 tablet (100 mg total) by mouth daily. 10 tablet 0   ibuprofen (ADVIL) 400 MG tablet TAKE 1 TABLET (400 MG TOTAL) BY MOUTH DAILY AS NEEDED. (Patient taking differently: Take 400 mg by mouth daily as needed for moderate pain.) 30 tablet 1   losartan (COZAAR) 100 MG tablet TAKE 1 TABLET BY MOUTH EVERY DAY (Patient taking differently: Take 100 mg by mouth daily.) 90 tablet 2   Magnesium 250 MG TABS Take 250 mg by mouth daily.     omeprazole (PRILOSEC) 40 MG  capsule Take 1 capsule (40 mg total) by mouth daily. 30 capsule 2   ondansetron (ZOFRAN-ODT) 8 MG disintegrating tablet '8mg'$  ODT q8 hours prn nausea 12 tablet 0   polyethylene glycol (MIRALAX / GLYCOLAX) 17 g packet Take 17 g by mouth every other day.     potassium chloride (KLOR-CON) 10 MEQ tablet TAKE 1 TABLET BY MOUTH EVERY DAY (Patient taking differently: Take 10 mEq by mouth daily.) 90 tablet 2   pravastatin (PRAVACHOL) 20 MG tablet TAKE 1 TABLET BY MOUTH EVERY DAY 90 tablet 3   pregabalin (LYRICA) 25 MG capsule Take 1 capsule (25 mg total) by mouth at bedtime. 30 capsule 1   spironolactone (ALDACTONE) 25 MG tablet TAKE 1 TABLET (25 MG TOTAL) BY MOUTH DAILY. 90 tablet 1   tamsulosin (FLOMAX) 0.4 MG CAPS capsule Take 1 capsule (0.4 mg total) by mouth daily. 30 capsule 0   traMADol (ULTRAM) 50 MG tablet Take 1 tablet (50 mg total) by mouth every 6 (six) hours as needed. 11 tablet 0   No current facility-administered medications on file prior to visit.    Past Medical History:  Diagnosis Date   Arthritis    Constipation    on stool softener   GERD (gastroesophageal reflux disease)    Hyperlipidemia    Hypertension    Trouble swallowing 2013   following lap band   Urticaria    No Known Allergies  Social History   Socioeconomic History   Marital status: Married    Spouse name: Not on file   Number of children: 1   Years of education: Not on file   Highest education level: Not on file  Occupational History   Not on file  Tobacco Use   Smoking status: Former    Types: Cigarettes    Quit date: 10/02/1972    Years since quitting: 49.5   Smokeless tobacco: Never  Vaping Use   Vaping Use: Never used  Substance and Sexual Activity   Alcohol use: No   Drug use: No   Sexual activity: Never  Other Topics Concern   Not on file  Social History Narrative   Not on file   Social Determinants of Health   Financial Resource Strain: Low Risk  (02/19/2022)   Overall Financial  Resource Strain (CARDIA)    Difficulty of Paying Living Expenses: Not hard at all  Food Insecurity: No Food Insecurity (02/19/2022)   Hunger Vital Sign    Worried About Running Out of Food in the Last Year: Never true    Ran Out of Food in the Last Year: Never true  Transportation Needs: No Transportation Needs (02/19/2022)   PRAPARE - Hydrologist (Medical): No  Lack of Transportation (Non-Medical): No  Physical Activity: Inactive (02/19/2022)   Exercise Vital Sign    Days of Exercise per Week: 0 days    Minutes of Exercise per Session: 0 min  Stress: No Stress Concern Present (02/19/2022)   [place] of Stress : Not at all  Social Connections: [place] (02/19/2022)   Social Connection and Isolation Panel [NHANES]    Frequency of Communication with Friends and Family: More than three times a week    Frequency of Social Gatherings with Friends and Family: More than three times a week    Attends Religious Services: More than 4 times per year    Active Member of [employer] or Organizations: Yes    Attends Archivist Meetings: More than 4 times per year    Marital Status: Married    Vitals:   03/27/22 1053  BP: 128/80  Pulse: 100  Resp: 12  Temp: 98.9 F (37.2 C)  SpO2: 98%   Body mass index is 45.75 kg/m.  Physical Exam Vitals and nursing note reviewed.  Constitutional:      General: She is not in acute distress.    Appearance: She is well-developed.  HENT:     Head: Normocephalic and atraumatic.     Mouth/Throat:     Mouth: Mucous membranes are moist.     Pharynx: Oropharynx is clear.  Eyes:     Conjunctiva/sclera: Conjunctivae normal.  Cardiovascular:     Rate and Rhythm: Normal rate and regular rhythm.     Pulses:          Dorsalis pedis pulses are 2+ on the right side and 2+ on the left side.     Heart sounds: No murmur heard.    Comments: No  significant pain with foot dorsi flexion. Left calf diameter bigger and tighter, tenderness with palpation bilateral. No erythema. Left pedal edema. Pulmonary:     Effort: Pulmonary effort is normal. No respiratory distress.     Breath sounds: Normal breath sounds.  Abdominal:     Palpations: Abdomen is soft. There is no hepatomegaly or mass.     Tenderness: There is no abdominal tenderness.  Lymphadenopathy:     Cervical: No cervical adenopathy.  Skin:    General: Skin is warm.     Findings: No erythema or rash.  Neurological:     General: No focal deficit present.     Mental Status: She is alert and oriented to person, place, and time.     Cranial Nerves: No cranial nerve deficit.     Gait: Gait normal.  Psychiatric:        Mood and Affect: Mood is anxious.     Comments: Well groomed, good eye contact.   ASSESSMENT AND PLAN:  Ms.Zela was seen today for follow-up.  Diagnoses and all orders for this visit:  Pain in both lower extremities -     Ambulatory referral to Pain Clinic  Hypertension, essential, benign  Hypokalemia  Edema of left lower extremity -     VAS [place] LOWER EXTREMITY VENOUS (DVT); Future  Nephrolithiasis    Orders Placed This Encounter  Procedures   Ambulatory referral to Pain Clinic   VAS [place] LOWER EXTREMITY VENOUS (DVT)    Hypertension, essential, benign BP elevated during recent ED visit, most likely due to pain. Today BP adequately controlled. Continue amlodipine 5 mg daily, losartan 100 mg daily, and spironolactone 25 mg  daily. Monitor BP at home. Continue low-salt/DASH diet.  Leg pain This is a chronic problem. We will review possible etiologies, I still think radicular pain is causing pain. Lumbar MRI was denied by her insurance. She has failed several medications and cannot afford Lyrica. Referral to rehab medicine placed today. She is not sure if oxycodone is helping, recommend monitoring for changes when she takes medication,  which was prescribed for nephrolithiasis.  Nephrolithiasis Following with urologist. Urine strainer given. Continue adequate hydration and Flomax 0.4 mg daily. Instructed about warning signs.  I spent a total of 44 minutes in both face to face and non face to face activities for this visit on the date of this encounter. During this time history was obtained and documented, examination was performed, prior labs/imaging reviewed, and assessment/plan discussed.  Return in about 5 months (around 08/27/2022).   Teigen Parslow G. Martinique, MD  [facility]. [place] office.

## 2022-03-27 ENCOUNTER — Encounter: Payer: Self-pay | Admitting: Family Medicine

## 2022-03-27 ENCOUNTER — Ambulatory Visit (INDEPENDENT_AMBULATORY_CARE_PROVIDER_SITE_OTHER): Payer: No Typology Code available for payment source | Admitting: Family Medicine

## 2022-03-27 VITALS — BP 128/80 | HR 100 | Temp 98.9°F | Resp 12 | Ht 62.0 in | Wt 250.1 lb

## 2022-03-27 DIAGNOSIS — E876 Hypokalemia: Secondary | ICD-10-CM | POA: Diagnosis not present

## 2022-03-27 DIAGNOSIS — M79605 Pain in left leg: Secondary | ICD-10-CM | POA: Diagnosis not present

## 2022-03-27 DIAGNOSIS — R600 Localized edema: Secondary | ICD-10-CM

## 2022-03-27 DIAGNOSIS — I10 Essential (primary) hypertension: Secondary | ICD-10-CM

## 2022-03-27 DIAGNOSIS — M79604 Pain in right leg: Secondary | ICD-10-CM

## 2022-03-27 DIAGNOSIS — N200 Calculus of kidney: Secondary | ICD-10-CM | POA: Insufficient documentation

## 2022-03-27 NOTE — Assessment & Plan Note (Addendum)
BP elevated during recent ED visit, most likely due to pain. Today BP adequately controlled. Continue amlodipine 5 mg daily, losartan 100 mg daily, and spironolactone 25 mg daily. Monitor BP at home. Continue low-salt/DASH diet.

## 2022-03-27 NOTE — Assessment & Plan Note (Signed)
Following with urologist. Urine strainer given. Continue adequate hydration and Flomax 0.4 mg daily. Instructed about warning signs.

## 2022-03-27 NOTE — Assessment & Plan Note (Signed)
This is a chronic problem. We will review possible etiologies, I still think radicular pain is causing pain. Lumbar MRI was denied by her insurance. She has failed several medications and cannot afford Lyrica. Referral to rehab medicine placed today. She is not sure if oxycodone is helping, recommend monitoring for changes when she takes medication, which was prescribed for nephrolithiasis.

## 2022-03-27 NOTE — Patient Instructions (Addendum)
A few things to remember from today's visit:  Pain in both lower extremities - Plan: Ambulatory referral to Pain Clinic  Hypertension, essential, benign  Hypokalemia  Edema of left lower extremity - Plan: VAS [place] LOWER EXTREMITY VENOUS (DVT)  Nephrolithiasis  If you need refills please call your pharmacy. Do not use My Chart to request refills or for acute issues that need immediate attention.   While taking Oxycodone monitor for improvement in leg pain, we could continue for pain management if it does help.  Please be sure medication list is accurate. If a new problem present, please set up appointment sooner than planned today.  No changes today. Compression stockings recommended. Monitor blood pressure at home.

## 2022-03-28 ENCOUNTER — Encounter: Payer: Self-pay | Admitting: Physical Medicine and Rehabilitation

## 2022-03-29 ENCOUNTER — Encounter (INDEPENDENT_AMBULATORY_CARE_PROVIDER_SITE_OTHER): Payer: Self-pay

## 2022-03-30 DIAGNOSIS — N201 Calculus of ureter: Secondary | ICD-10-CM | POA: Diagnosis not present

## 2022-03-31 ENCOUNTER — Other Ambulatory Visit: Payer: Self-pay | Admitting: Family Medicine

## 2022-04-19 DIAGNOSIS — K5730 Diverticulosis of large intestine without perforation or abscess without bleeding: Secondary | ICD-10-CM | POA: Diagnosis not present

## 2022-04-19 DIAGNOSIS — K449 Diaphragmatic hernia without obstruction or gangrene: Secondary | ICD-10-CM | POA: Diagnosis not present

## 2022-04-19 DIAGNOSIS — N200 Calculus of kidney: Secondary | ICD-10-CM | POA: Diagnosis not present

## 2022-04-19 DIAGNOSIS — N201 Calculus of ureter: Secondary | ICD-10-CM | POA: Diagnosis not present

## 2022-04-19 DIAGNOSIS — R3129 Other microscopic hematuria: Secondary | ICD-10-CM | POA: Diagnosis not present

## 2022-04-25 ENCOUNTER — Encounter: Payer: Self-pay | Admitting: Physical Medicine and Rehabilitation

## 2022-04-25 ENCOUNTER — Encounter
Payer: No Typology Code available for payment source | Attending: Physical Medicine and Rehabilitation | Admitting: Physical Medicine and Rehabilitation

## 2022-04-25 VITALS — BP 155/95 | HR 70 | Ht 62.0 in | Wt 246.0 lb

## 2022-04-25 DIAGNOSIS — M79604 Pain in right leg: Secondary | ICD-10-CM | POA: Diagnosis not present

## 2022-04-25 DIAGNOSIS — R260 Ataxic gait: Secondary | ICD-10-CM | POA: Insufficient documentation

## 2022-04-25 DIAGNOSIS — R269 Unspecified abnormalities of gait and mobility: Secondary | ICD-10-CM | POA: Diagnosis not present

## 2022-04-25 DIAGNOSIS — R600 Localized edema: Secondary | ICD-10-CM | POA: Diagnosis not present

## 2022-04-25 DIAGNOSIS — M79605 Pain in left leg: Secondary | ICD-10-CM | POA: Insufficient documentation

## 2022-04-25 DIAGNOSIS — G609 Hereditary and idiopathic neuropathy, unspecified: Secondary | ICD-10-CM | POA: Insufficient documentation

## 2022-04-25 MED ORDER — GABAPENTIN 600 MG PO TABS: 300.0000 mg | ORAL_TABLET | Freq: Two times a day (BID) | ORAL | 1 refills | Status: DC

## 2022-04-25 NOTE — Patient Instructions (Addendum)
Today, we discussed possible contributors to your leg pain.  A referral has been sent for 2-3 sessions of PT, to focus on stretching/strengthening program for your legs.  You will return to the office for an EMG, or nerve study, of your legs. After this, you will have a follow up appointment within 1-2 weeks to go over results and a plan of care.  Please start voltaren gel to your legs as needed twice to four times daily. Continue use of as needed tylenol.   A script has been sent to your pharmacy to start gabapentin 300 mg (half a tablet) twice daily. Call office after 1-2 weeks to report any concerns or side effects. If tolerating well, may increase evening dose to 1 tablet after 2 weeks.   Please wear compression socks and elevate your legs as much as possible to limit swelling. I will send a message to your primary care doctor about medication changes to address edema. If these do not improve your symptoms, we will discuss getting therapy specifically for edema management.    Please call your family doctor to schedule lower extremity duplex to evaluate for blood clots.

## 2022-04-25 NOTE — Assessment & Plan Note (Signed)
Uncertain etiology, contributing to patient discomfort.   Advised her to follow up with PCP on scheduling bilateral duplex, ordered 8/29.  At the PCP's discretion, would consider starting low-dose Lasix 20 mg as needed for swelling. Could also consider medication adjustments to amlodipine and/or oral diclofenac limit LE edema as side effect. I will send this visit note to Dr. Martinique to review these recommendations.   Advised patient to use compression stockings and elevate as much as possible.  If unable to control edema with these measures, will discuss referral to therapy for edema management next visit.

## 2022-04-25 NOTE — Assessment & Plan Note (Signed)
Appreciate hip flexor tightness and weakness in abductors on exam contributing to unsteady gait.  PT script for 2-3 sessions provided to help develop HEP to target stretching, strengthening, and gait mechanics.

## 2022-04-25 NOTE — Assessment & Plan Note (Signed)
Uncertain etiology at this time; has components of arthritic pain (worse with prolonged sitting/laying, improved with activity) and neuropathic pain (burning/tingling). Some contribution of leg edema >3 months to discomfort as well.  Reviewed results of Xr lumbar spine 12/2020 and MRI lumbar spine 12/2017, with independent review of images. Agree with mild anteriolisthesis at L4-5, with some mild neuroforaminal narrowing at R L4-5 and L5-S1. No indications of spinal stenosis.  Ordered EMG bilateral lower extremities to evaluate for peripheral neuropathy vs. Radiculopathy; patient will follow up with me 1-2 weeks after to discuss results.  Advised patient to use voltaren gel OTC on her legs 2-4x daily for adjunctive pain control. She may continue PRN tylenol. Defer to PCP whether to continue once weekly oral diclofenac.  Patient only used gabapentin for sleep QHS in the past; will re-initiate with 300 mg BID (renal function 02/2022 labs reviewed). Patient will call in 1-2 weeks and, of tolerating well, increase to 300 mg Am / 600 mg PM.

## 2022-04-25 NOTE — Progress Notes (Signed)
Subjective:    Patient ID: Jennifer Davila, female    DOB: [birth year], 66 y.o.   MRN: [id number]  HPI  Jennifer Davila is a 66 y.o. year old female  who  has a past medical history of Arthritis, Constipation, GERD (gastroesophageal reflux disease), Hyperlipidemia, Hypertension, Trouble swallowing (2013), and Urticaria.   They are presenting to PM&R clinic as a new patient for pain management and rehab evaluation. They were referred by Dr. Martinique Betty for treatment of Chronic bilateral leg pain.   From her note 03/27/22: "Last visit she agreed with trying Lyrica, she could not afford medication. She has failed several treatments: Hydrocodone,Tramadol,Gabapentin,Duloxetine among some. Ibuprofen helps.   Lumbar MRI done in 12/2017: L4-5 bilateral facet arthropathy with edema, anterolisthesis, and L5-S1 facet OA. Canal and foraminal stenosis. Pain is worse at night, interferes with sleep.   She was referred to neurosurgeon, did not think lower extremity pain was radicular. Back pain is not frequent, exacerbated by prolonged standing and walking. It is not radiated.   She has been evaluated by orthopedics and rheumatologist."  Source: Bilateral legs, L>R, mostly distal anterior shin extending to feet Inciting incident: None; gradually increased over several years Duration of pain: Constant low level pain, with exacerbating activities lasting several hours. Description of pain: Tingling and burning Severity: On average 7-8/10. At worst 10/10. At best 1-2/10. Exacerbating factors: Sitting for long periods or laying down at nighttime bothers her. Remitting factors: Getting up and moving seems to improve it. Walking,  standing, stretching improved pain.   Red flag symptoms: Patient denies saddle anesthesia, loss of bowel or bladder continence, new numbness/tingling, or pain waking up at nighttime.  Gait: "When I walk, it's like I'm trying to walk straight".   Medications  tried: Topical medications (some effect) : Has tried lidocaine on her legs, helps for a few minutes. Has not tried capsaicin cream or aspercreme/voltaren.  Nsaids ( good effect): Takes diclofenac 400 mg once weekly to help her get through church; otherwise avoids due to Hx hospitalization from GIB.  Tylenol  ( some effect): Takes 500 mg QID, with good benefit.  Opiates  ( some effect): Has taken Tramadol, Vicodin, and Norco 5 mg. She stopped all because it makes her feel "out of space", "sleepy".  Gabapentin / Lyrica  ( no effect): Tried gabapentin 600 mg QHS in the past, didn't have an effect. Insurance didn't cover Lyrica and it was too expensive OOP ($300) TCAs : Never tried SNRIs : Per PCP, no effect. Patient does not remember.  Other: none  Other treatments: PT/OT  (no effect): Last 2022, for 2-3 months went to PT. Unsure if if it was helpful. Worked on standing, stretching, posture. Got HEP but didn't find it helpful so stopped after a week.  Accupuncture/chiropractor/massage: Never tried.  TENs unit : never tried Injections : never tried Surgery : Never done, wants to minimize.  Other: Had ABI recently, no decreased flow. Has duplex pending.   Goals for pain control: "To walk in confidence that I know I can make it". Denies any falls, but states she is concerned about "taking someone else with me". 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 Son assists but also works 2 jobs, so he isn't much assistance. They have been trying to use home aides but they keep changing, looking into getting paid as aide for her husband through the [place].   Does not use assistive device.   Pain Inventory Average Pain 8 Pain Right  Now 8 My pain is aching  In the last 24 hours, has pain interfered with the following? General activity 0 Relation with others 0 Enjoyment of life 0 What TIME of day is your pain at its worst? morning , daytime, evening, and night Sleep (in general) Good  Pain is worse with:  bending, sitting, and standing Pain improves with: medication Relief from Meds: 2  walk without assistance ability to climb steps?  yes do you drive?  yes  not employed: date last employed .  numbness tingling spasms dizziness  Any changes since last visit?  no  Any changes since last visit?  no    Family History  Problem Relation Age of Onset   Hypertension Mother    Stroke Mother    Diabetes Father    Alcohol abuse Father    Kidney disease Father    Colon polyps Brother    Cancer Brother    Alcohol abuse Brother    Social History   Socioeconomic History   Marital status: Married    Spouse name: Not on file   Number of children: 1   Years of education: Not on file   Highest education level: Not on file  Occupational History   Not on file  Tobacco Use   Smoking status: Former    Types: Cigarettes    Quit date: 10/02/1972    Years since quitting: 49.5   Smokeless tobacco: Never  Vaping Use   Vaping Use: Never used  Substance and Sexual Activity   Alcohol use: No   Drug use: No   Sexual activity: Never  Other Topics Concern   Not on file  Social History Narrative   Not on file   Social Determinants of Health   Financial Resource Strain: Low Risk  (02/19/2022)   Overall Financial Resource Strain (CARDIA)    Difficulty of Paying Living Expenses: Not hard at all  Food Insecurity: No Food Insecurity (02/19/2022)   Hunger Vital Sign    Worried About Running Out of Food in the Last Year: Never true    Ran Out of Food in the Last Year: Never true  Transportation Needs: No Transportation Needs (02/19/2022)   PRAPARE - Hydrologist (Medical): No    Lack of Transportation (Non-Medical): No  Physical Activity: Inactive (02/19/2022)   Exercise Vital Sign    Days of Exercise per Week: 0 days    Minutes of Exercise per Session: 0 min  Stress: No Stress Concern Present (02/19/2022)   [place]    Feeling of Stress : Not at all  Social Connections: [place] (02/19/2022)   Social Connection and Isolation Panel [NHANES]    Frequency of Communication with Friends and Family: More than three times a week    Frequency of Social Gatherings with Friends and Family: More than three times a week    Attends Religious Services: More than 4 times per year    Active Member of Clubs or Organizations: Yes    Attends Music therapist: More than 4 times per year    Marital Status: Married   Past Surgical History:  Procedure Laterality Date   ABDOMINAL HYSTERECTOMY     BREAST BIOPSY Left 11/04/2019    BENIGN LYMPH NODE WITH PARACORTICAL HYPERPLASIA   CHOLECYSTECTOMY     COLONOSCOPY N/A 01/08/2018   Procedure: COLONOSCOPY;  Surgeon: Yetta Flock, MD;  Location: [facility];  Service:  Gastroenterology;  Laterality: N/A;   LAPAROSCOPIC GASTRIC BANDING     LAPAROSCOPIC REPAIR AND REMOVAL OF GASTRIC BAND  2013-2014   Past Medical History:  Diagnosis Date   Arthritis    Constipation    on stool softener   GERD (gastroesophageal reflux disease)    Hyperlipidemia    Hypertension    Trouble swallowing 2013   following lap band   Urticaria    BP (!) 155/95   Pulse 70   Ht '5\' 2"'$  (1.575 m)   Wt 246 lb (111.6 kg)   SpO2 96%   BMI 44.99 kg/m   Opioid Risk Score:   Fall Risk Score:  `1  Depression screen [facility] 2/9     04/25/2022   10:54 AM 02/19/2022    2:21 PM 02/12/2022   10:51 AM 12/19/2020    2:07 PM 12/19/2020    2:03 PM 08/27/2019    6:37 PM 10/18/2017    8:42 AM  Depression screen PHQ 2/9  Decreased Interest 0 0 0 0 0 0 0  Down, Depressed, Hopeless 0 0 0 0 0 0 0  PHQ - 2 Score 0 0 0 0 0 0 0  Altered sleeping 1        Tired, decreased energy 1        Change in appetite 1        Feeling bad or failure about yourself  0        Trouble concentrating 0        Moving slowly or fidgety/restless 0        Suicidal  thoughts 0        PHQ-9 Score 3        Difficult doing work/chores Not difficult at all           Review of Systems  Constitutional: Negative.   HENT: Negative.    Eyes: Negative.   Respiratory: Negative.    Cardiovascular: Negative.   Gastrointestinal: Negative.   Endocrine: Negative.   Genitourinary: Negative.   Musculoskeletal: Negative.   Skin: Negative.   Allergic/Immunologic: Negative.   Neurological:  Positive for dizziness and numbness.  Hematological: Negative.   Psychiatric/Behavioral: Negative.        Objective:   Physical Exam Constitution: Appropriate appearance for age. No apparnet distress +Obese HEENT: PERRL, EOMI grossly intact.  Resp: CTAB. No rales, rhonchi, or wheezing. Cardio: RRR. No mumurs, rubs, or gallops. Bilaterally LE pitting edema 3+.  Abdomen: Nondistended. Nontender. +bowel sounds. Psych: Appropriate mood and affect.  Neurologic Exam:   DTRs: Reflexes were 1+ in bilateral lower extremities. Babinsky: flexor responses b/l.   Sensory exam: revealed normal sensation in all dermatomal regions in bilateral  lower extremities. Motor exam: strength normal in all myotomal regions of bilateral lower extremities.  Coordination: Fine motor coordination was normal.   Gait: Gait with good clearance in stride, walks with knees collapsing medially and has to shift weight laterally and widen stance to compensate.  Balance: Difficulty standing with feet together, eyes open.   Back Exam:   Inspection: Pelvis was even.  Lumbar lordotic curvature was  increased .  There was no evidence of scoliosis.  Palpation: No TTP over paraspinals, axial spine, PSIS, SI joints, or trochanteric bursa. There was no evidence of spasm. No trigger points were noted.    ROM:  Flexion wnl,  Extension wnl  Special/provocative testing:    SLR: negative   Slump test: negative   Facet loading: Mild, non-radiating pain bilaterally  TTP at paraspinals: none   Thomas Test: + RLE        Assessment & Plan:   Jennifer Davila is a 66 y.o. year old female  who  has a past medical history of Arthritis, Constipation, GERD (gastroesophageal reflux disease), Hyperlipidemia, Hypertension, Trouble swallowing (2013), and Urticaria.   They are presenting to PM&R clinic as a new patient for bilateral leg pain, etiology uncertain, ddx includes neuropathic pain vs. Arthritic vs. D/t edema.    Bilateral lower extremity edema Assessment & Plan: Uncertain etiology, contributing to patient discomfort.   Advised her to follow up with PCP on scheduling bilateral duplex, ordered 8/29.  At the PCP's discretion, would consider starting low-dose Lasix 20 mg as needed for swelling. Could also consider medication adjustments to amlodipine and/or oral diclofenac limit LE edema as side effect. I will send this visit note to Dr. Martinique to review these recommendations.   Advised patient to use compression stockings and elevate as much as possible.  If unable to control edema with these measures, will discuss referral to therapy for edema management next visit.      Bilateral leg pain Assessment & Plan: Uncertain etiology at this time; has components of arthritic pain (worse with prolonged sitting/laying, improved with activity) and neuropathic pain (burning/tingling). Some contribution of leg edema >3 months to discomfort as well.  Reviewed results of Xr lumbar spine 12/2020 and MRI lumbar spine 12/2017, with independent review of images. Agree with mild anteriolisthesis at L4-5, with some mild neuroforaminal narrowing at R L4-5 and L5-S1. No indications of spinal stenosis.  Ordered EMG bilateral lower extremities to evaluate for peripheral neuropathy vs. Radiculopathy; patient will follow up with me 1-2 weeks after to discuss results.  Advised patient to use voltaren gel OTC on her legs 2-4x daily for adjunctive pain control. She may continue PRN tylenol. Defer to PCP whether to continue  once weekly oral diclofenac.  Patient only used gabapentin for sleep QHS in the past; will re-initiate with 300 mg BID (renal function 02/2022 labs reviewed). Patient will call in 1-2 weeks and, of tolerating well, increase to 300 mg Am / 600 mg PM.    Orders: -     Ambulatory referral to Physical Therapy  Gait difficulty Assessment & Plan: Appreciate hip flexor tightness and weakness in abductors on exam contributing to unsteady gait.  PT script for 2-3 sessions provided to help develop HEP to target stretching, strengthening, and gait mechanics.   Orders: -     Ambulatory referral to Physical Therapy  Hereditary and idiopathic peripheral neuropathy  Other orders -     Gabapentin; Take 0.5 tablets (300 mg total) by mouth 2 (two) times daily. Call office after 1-2 weeks to report any concerns or side effects. If tolerating well, may increase evening dose to 1 tablet after 2 weeks.  Dispense: 30 tablet; Refill: [place], DO 04/25/2022

## 2022-05-09 ENCOUNTER — Ambulatory Visit
Admission: RE | Admit: 2022-05-09 | Discharge: 2022-05-09 | Disposition: A | Discharge status: Home / Self Care | Payer: No Typology Code available for payment source | Source: Ambulatory Visit | Attending: Family Medicine | Admitting: Family Medicine

## 2022-05-09 DIAGNOSIS — Z780 Asymptomatic menopausal state: Secondary | ICD-10-CM | POA: Diagnosis not present

## 2022-05-09 DIAGNOSIS — M8588 Other specified disorders of bone density and structure, other site: Secondary | ICD-10-CM | POA: Diagnosis not present

## 2022-05-09 DIAGNOSIS — Z1231 Encounter for screening mammogram for malignant neoplasm of breast: Secondary | ICD-10-CM

## 2022-05-10 ENCOUNTER — Ambulatory Visit
Payer: No Typology Code available for payment source | Attending: Physical Medicine and Rehabilitation | Admitting: Physical Therapy

## 2022-05-10 ENCOUNTER — Other Ambulatory Visit: Payer: Self-pay | Admitting: Family Medicine

## 2022-05-10 DIAGNOSIS — M79605 Pain in left leg: Secondary | ICD-10-CM | POA: Diagnosis not present

## 2022-05-10 DIAGNOSIS — R600 Localized edema: Secondary | ICD-10-CM | POA: Diagnosis not present

## 2022-05-10 DIAGNOSIS — R269 Unspecified abnormalities of gait and mobility: Secondary | ICD-10-CM | POA: Diagnosis not present

## 2022-05-10 DIAGNOSIS — M79604 Pain in right leg: Secondary | ICD-10-CM | POA: Insufficient documentation

## 2022-05-10 DIAGNOSIS — R262 Difficulty in walking, not elsewhere classified: Secondary | ICD-10-CM | POA: Insufficient documentation

## 2022-05-10 DIAGNOSIS — R928 Other abnormal and inconclusive findings on diagnostic imaging of breast: Secondary | ICD-10-CM

## 2022-05-10 NOTE — Therapy (Signed)
OUTPATIENT PHYSICAL THERAPY NEURO EVALUATION   Patient Name: Jennifer Davila MRN: [id number] DOB:1955/10/28, 66 y.o., female Today's Date: 05/11/2022   PCP: Martinique, [name], MD REFERRING PROVIDER: Gertie Gowda, DO    PT End of Session - 05/11/22 0946     Visit Number 1    Number of Visits 9    Date for PT Re-Evaluation 07/06/22    PT Start Time 1018    PT Stop Time 1100    PT Time Calculation (min) 42 min    Activity Tolerance Patient tolerated treatment well    Behavior During Therapy [facility] for tasks assessed/performed             Past Medical History:  Diagnosis Date   Arthritis    Constipation    on stool softener   GERD (gastroesophageal reflux disease)    Hyperlipidemia    Hypertension    Trouble swallowing 2013   following lap band   Urticaria    Past Surgical History:  Procedure Laterality Date   ABDOMINAL HYSTERECTOMY     BREAST BIOPSY Left 11/04/2019    BENIGN LYMPH NODE WITH PARACORTICAL HYPERPLASIA   CHOLECYSTECTOMY     COLONOSCOPY N/A 01/08/2018   Procedure: COLONOSCOPY;  Surgeon: Yetta Flock, MD;  Location: [facility];  Service: Gastroenterology;  Laterality: N/A;   LAPAROSCOPIC GASTRIC BANDING     LAPAROSCOPIC REPAIR AND REMOVAL OF GASTRIC BAND  2013-2014   Patient Active Problem List   Diagnosis Date Noted   Gait difficulty 04/25/2022   Nephrolithiasis 03/27/2022   COVID-19 virus infection 07/19/2021   GI bleed 07/18/2021   Prediabetes 05/30/2021   Bilateral leg pain 06/03/2020   Chronic urticaria 05/26/2020   Seasonal and perennial allergic rhinitis 05/26/2020   Positive ANA (antinuclear antibody) 05/26/2020   Iron deficiency anemia 07/07/2018   Hematochezia 06/26/2018   Diverticulosis of colon with hemorrhage    Leg pain 01/06/2018   Bilateral lower extremity edema 01/06/2018   Lower GI bleed 01/06/2018   Hypokalemia 01/06/2018   Constipation 01/06/2018   Leg cramping 10/18/2017   Vitamin D deficiency, unspecified  10/18/2017   GERD (gastroesophageal reflux disease) 10/18/2017   Hypertension, essential, benign 09/04/2017   Hyperlipidemia 09/04/2017   Morbid obesity ([place]) 09/04/2017   Vertigo 09/04/2017   Hip pain 05/25/2015    ONSET DATE: early 2022: Referral date 04-25-22  REFERRING DIAG: Diagnosis M79.604,M79.605 (ICD-10-CM) - Bilateral leg pain R26.9 (ICD-10-CM) - Gait difficulty   THERAPY DIAG:  Pain in left leg - Plan: PT plan of care cert/re-cert  Pain in right leg - Plan: PT plan of care cert/re-cert  Localized edema - Plan: PT plan of care cert/re-cert  Difficulty in walking, not elsewhere classified - Plan: PT plan of care cert/re-cert  Rationale for Evaluation and Treatment Rehabilitation  SUBJECTIVE:  SUBJECTIVE STATEMENT:  Jennifer Davila is a 66 y.o. year old female  who  has a past medical history of Arthritis, Constipation, GERD (gastroesophageal reflux disease), Hyperlipidemia, Hypertension, Trouble swallowing (2013), and Urticaria.   She presented to PM&R clinic as a new patient for bilateral leg pain, etiology uncertain, ddx includes neuropathic pain vs. Arthritic vs. D/t edema. Reports leg pain since approx. Early 2022; she is amb. Without device - reports occasional unsteadiness and inability to amb. Straight without weaving  Pt had OP PT at [facility] in July 2022 for 3 visits only - states she did not feel that the exercises were helping very much   Pt accompanied by: self  PERTINENT HISTORY: Arthritis, Constipation, GERD (gastroesophageal reflux disease), Hyperlipidemia, Hypertension, Trouble swallowing (2013), and Urticaria      PAIN:  Are you having pain? Yes: NPRS scale: 9/10 Pain location: Bil. legs Pain description: tightness Aggravating factors: prolonged sitting and  walking Relieving factors: Gabapentin  PRECAUTIONS: None  WEIGHT BEARING RESTRICTIONS No  FALLS: Has patient fallen in last 6 months? No  LIVING ENVIRONMENT: Lives with: lives with their spouse Lives in: House/apartment Stairs: Yes: External: 6 steps; on right going up Has following equipment at home: None  PLOF: Independent  PATIENT GOALS HEP for LE stretching and strengthening  OBJECTIVE:   DIAGNOSTIC FINDINGS: Lumbar MRI done in 12/2017: L4-5 bilateral facet arthropathy with edema, anterolisthesis, and L5-S1 facet OA. Canal and foraminal stenosis.  COGNITION: Overall cognitive status: Within functional limits for tasks assessed   SENSATION: WFL   EDEMA:  Pt has bil. LE edema - wearing compression stockings - has worn them for past year     LOWER EXTREMITY ROM:   WNL's   LOWER EXTREMITY MMT:    MMT Right Eval Left Eval  Hip flexion 4 4  Hip extension Pt unable to lie prone NT - unable to lie prone  Hip abduction 4- 4-  Hip adduction    Hip internal rotation    Hip external rotation    Knee flexion    Knee extension 5 5  Ankle dorsiflexion 5 5  Ankle plantarflexion    Ankle inversion    Ankle eversion    (Blank rows = not tested)  TRANSFERS: Assistive device utilized: None  Sit to stand: Complete Independence Stand to sit: Complete Independence    GAIT: Gait pattern: WFL with occasional unsteadiness but pt able to recover independently Distance walked: 22'   TODAY'S TREATMENT:  Medbridge HEP and pt was given sheet with hamstring/heel cord stretches Access Code: [email or web address] URL: [email or web address] Date: 05/11/2022 Prepared by: Ethelene Browns  Exercises - Hooklying Hamstring Stretch with Strap  - 3 x daily - 7 x weekly - 1 sets - 1 reps - 30 hold  PATIENT EDUCATION: Education details: HEP for bil. Hamstrings and heel cord stretches (Medbridge & handout) Person educated: Patient Education method: Explanation, Demonstration,  and Handouts Education comprehension: verbalized understanding and returned demonstration   HOME EXERCISE PROGRAM: See above - Med[employer] HEP and sheet with hamstrings and heel cord stretches    GOALS: Goals reviewed with patient? Yes  SHORT TERM GOALS: Target date: 06/08/2022  Pt will be independent in HEP for bil. LE stretching and strengthening.  Baseline: Goal status: INITIAL  2.  Pt will report reduced bil. Leg pain to </= 6/10 for increased tolerance and comfort with mobility and ADL's. Baseline: 9/10 in legs at eval on 05-10-22 Goal status: INITIAL  3.  Initiate aquatic PT for less pain produced  with active exercise/ambulation. Baseline:  Goal status: INITIAL   LONG TERM GOALS: Target date: 07/06/2022  Independent in updated HEP for bil. LE stretching, strengthening and balance exercises. Baseline:  Goal status: INITIAL  2. Pt will report at least 50% improvement in bil. Leg pain for increased comfort and tolerance with mobility and ADL's. Baseline:  Goal status: INITIAL  3.   Pt will amb. 10" nonstop without LOB with c/o leg pain </= 4/10. Baseline:  Goal status: INITIAL  4.  Pt will participate in aquatic exercise session with c/o pain in legs < 3/10.   Baseline: 9/10 c/o pain in bil. Legs on land Goal status: INITIAL  ASSESSMENT:  CLINICAL IMPRESSION: Patient is a 66 y.o. lady who was seen today for physical therapy evaluation and treatment for bil. LE pain, tightness,and weakness.  Pt had c/o pain and cramping in bil. Hamstrings with MMT and with bridging exercise.  Pt has weakness in bil. Hip abductors (4-/5 bil.) and has tightness in bil. Hamstrings and heel cords.   Pt has mild unsteady gait and reports inability to amb. In a straight path.  Pt rates pain 9/10 intensity in legs at today's eval.  Pt will benefit from PT, including aquatic therapy, to address LE tightness, weakness, gait and balance deficits.       OBJECTIVE IMPAIRMENTS decreased balance,  difficulty walking, decreased strength, increased muscle spasms, impaired flexibility, postural dysfunction, and pain.   ACTIVITY LIMITATIONS bending, squatting, stairs, and locomotion level  PARTICIPATION LIMITATIONS: cleaning, laundry, shopping, and community activity  PERSONAL FACTORS Fitness, Past/current experiences, Time since onset of injury/illness/exacerbation, and 1 comorbidity: unknown etiology of pain  are also affecting patient's functional outcome.   REHAB POTENTIAL: Good  CLINICAL DECISION MAKING: Evolving/moderate complexity  EVALUATION COMPLEXITY: Moderate  PLAN: PT FREQUENCY: 1x/week  PT DURATION: 8 weeks  PLANNED INTERVENTIONS: Therapeutic exercises, Therapeutic activity, Neuromuscular re-education, Balance training, Gait training, Patient/Family education, Self Care, Stair training, and Aquatic Therapy  PLAN FOR NEXT SESSION: check HEP issued on 05-10-22 - cont LE stretching and strengthening; issue balance exercises; give hip abdct. Exercise with theraband   Alda Lea, PT 05/11/2022, 11:00 AM

## 2022-05-11 ENCOUNTER — Encounter: Payer: Self-pay | Admitting: Physical Therapy

## 2022-05-14 ENCOUNTER — Encounter: Payer: Self-pay | Admitting: Family Medicine

## 2022-05-14 DIAGNOSIS — M8580 Other specified disorders of bone density and structure, unspecified site: Secondary | ICD-10-CM | POA: Insufficient documentation

## 2022-05-16 ENCOUNTER — Encounter
Payer: No Typology Code available for payment source | Attending: Physical Medicine and Rehabilitation | Admitting: Physical Medicine and Rehabilitation

## 2022-05-16 ENCOUNTER — Encounter: Payer: Self-pay | Admitting: Physical Medicine and Rehabilitation

## 2022-05-16 VITALS — BP 174/96 | HR 86 | Temp 99.0°F | Ht 62.0 in | Wt 248.0 lb

## 2022-05-16 DIAGNOSIS — M79604 Pain in right leg: Secondary | ICD-10-CM | POA: Insufficient documentation

## 2022-05-16 DIAGNOSIS — M79605 Pain in left leg: Secondary | ICD-10-CM | POA: Insufficient documentation

## 2022-05-16 NOTE — Progress Notes (Signed)
EMG/NCS bilateral lower extremities performed today. Results to be scanned into Epic for review at follow up visit.    Gertie Gowda, DO 05/16/2022

## 2022-05-17 ENCOUNTER — Ambulatory Visit: Payer: No Typology Code available for payment source | Admitting: Physical Therapy

## 2022-05-17 ENCOUNTER — Encounter: Payer: Self-pay | Admitting: Physical Therapy

## 2022-05-17 DIAGNOSIS — R262 Difficulty in walking, not elsewhere classified: Secondary | ICD-10-CM

## 2022-05-17 DIAGNOSIS — M79605 Pain in left leg: Secondary | ICD-10-CM | POA: Diagnosis not present

## 2022-05-17 DIAGNOSIS — M79604 Pain in right leg: Secondary | ICD-10-CM

## 2022-05-17 NOTE — Therapy (Signed)
OUTPATIENT PHYSICAL THERAPY NEURO TREATMENT NOTE   Patient Name: Jennifer Davila MRN: [id number] DOB:1956-06-09, 66 y.o., female Today's Date: 05/18/2022   PCP: [place], [name], MD REFERRING PROVIDER: Gertie Gowda, DO    PT End of Session - 05/18/22 1112     Visit Number 2    Number of Visits 9    Date for PT Re-Evaluation 07/06/22    PT Start Time 7412    PT Stop Time 1450    PT Time Calculation (min) 47 min    Activity Tolerance Patient tolerated treatment well    Behavior During Therapy [facility] for tasks assessed/performed              Past Medical History:  Diagnosis Date   Arthritis    Constipation    on stool softener   GERD (gastroesophageal reflux disease)    Hyperlipidemia    Hypertension    Trouble swallowing 2013   following lap band   Urticaria    Past Surgical History:  Procedure Laterality Date   ABDOMINAL HYSTERECTOMY     BREAST BIOPSY Left 11/04/2019    BENIGN LYMPH NODE WITH PARACORTICAL HYPERPLASIA   CHOLECYSTECTOMY     COLONOSCOPY N/A 01/08/2018   Procedure: COLONOSCOPY;  Surgeon: Yetta Flock, MD;  Location: [facility];  Service: Gastroenterology;  Laterality: N/A;   LAPAROSCOPIC GASTRIC BANDING     LAPAROSCOPIC REPAIR AND REMOVAL OF GASTRIC BAND  2013-2014   Patient Active Problem List   Diagnosis Date Noted   Osteopenia 05/14/2022   Gait difficulty 04/25/2022   Nephrolithiasis 03/27/2022   COVID-19 virus infection 07/19/2021   GI bleed 07/18/2021   Prediabetes 05/30/2021   Bilateral leg pain 06/03/2020   Chronic urticaria 05/26/2020   Seasonal and perennial allergic rhinitis 05/26/2020   Positive ANA (antinuclear antibody) 05/26/2020   Iron deficiency anemia 07/07/2018   Hematochezia 06/26/2018   Diverticulosis of colon with hemorrhage    Leg pain 01/06/2018   Bilateral lower extremity edema 01/06/2018   Lower GI bleed 01/06/2018   Hypokalemia 01/06/2018   Constipation 01/06/2018   Leg cramping 10/18/2017    Vitamin D deficiency, unspecified 10/18/2017   GERD (gastroesophageal reflux disease) 10/18/2017   Hypertension, essential, benign 09/04/2017   Hyperlipidemia 09/04/2017   Morbid obesity ([place]) 09/04/2017   Vertigo 09/04/2017   Hip pain 05/25/2015    ONSET DATE: early 2022: Referral date 04-25-22  REFERRING DIAG: Diagnosis M79.604,M79.605 (ICD-10-CM) - Bilateral leg pain R26.9 (ICD-10-CM) - Gait difficulty   THERAPY DIAG:  Difficulty in walking, not elsewhere classified  Pain in left leg  Pain in right leg  Rationale for Evaluation and Treatment Rehabilitation  SUBJECTIVE:  SUBJECTIVE STATEMENT:    Pt reports doing stretches 2-3x day; still feel the pain in the hamstrings when she walks a lot;  not having as much pain today as she was having at eval   Pt accompanied by: self  PERTINENT HISTORY: Arthritis, Constipation, GERD (gastroesophageal reflux disease), Hyperlipidemia, Hypertension, Trouble swallowing (2013), and Urticaria      PAIN:  Are you having pain? Yes: NPRS scale: 5/10 Pain location: Bil. legs Pain description: tightness Aggravating factors: prolonged sitting and walking Relieving factors: Gabapentin  PRECAUTIONS: None  WEIGHT BEARING RESTRICTIONS No  FALLS: Has patient fallen in last 6 months? No  LIVING ENVIRONMENT: Lives with: lives with their spouse Lives in: House/apartment Stairs: Yes: External: 6 steps; on right going up Has following equipment at home: None  PLOF: Independent  PATIENT GOALS HEP for LE stretching and strengthening  OBJECTIVE:    TODAY'S TREATMENT:  Pt performed bil. LE stretching - runner's stretch and gastroc stretch in standing at counter - 1 rep each leg each stretch 20 sec hold Seated bil. Hamstring stretch 20 sec hold x 1  rep  Added strengthening exercises to HEP Supine - bridging 5 reps due to c/o feeling that cramp in hamstrings was going to occur SLR 5 reps each leg Sidelying hip abduction RLE and LLE 5 reps no increased resistance  Seated LAQ and knee flexion 5 reps each leg with red theraband  Standing exercises with red theraband for strengthening;  hip flexion, extension and abduction 5 reps each leg each direction;  standing hip and knee flexion 5 reps each leg with red theraband  Issued these exs. In addition to previous HEP;  [place] [email or web address] (05-17-22) Access Code: [email or web address] URL: [email or web address] Date: 05/18/2022 Prepared by: Ethelene Browns  Exercises - Seated Knee Extension with Resistance  - 1 x daily - 7 x weekly - 1 sets - 10 reps - Seated Hamstring Curl with Anchored Resistance  - 1 x daily - 7 x weekly - 1 sets - 10 reps - Standing Hip Flexion with Resistance  - 1 x daily - 7 x weekly - 1 sets - 10 reps - Standing Hip Extension with Resistance  - 1 x daily - 7 x weekly - 3 sets - 10 reps - Standing Hip Abduction with Resistance  - 1 x daily - 7 x weekly - 1 sets - 10 reps - Beginner Bridge  - 1 x daily - 7 x weekly - 1 sets - 10 reps - Active Straight Leg Raise with Quad Set  - 1 x daily - 7 x weekly - 1 sets - 10 reps - Sidelying Hip Abduction  - 1 x daily - 7 x weekly - 1 sets - 10 reps - Hip and Knee Flexion with Anchored Resistance  - 1 x daily - 7 x weekly - 1 sets - 10 reps  GAIT: Gait pattern: WFL with occasional unsteadiness but pt able to recover independently Distance walked: 350' (3 laps around track at end of session)    05-10-22:   Medbridge HEP and pt was given sheet with hamstring/heel cord stretches Access Code: [email or web address] URL: [email or web address] Date: 05/11/2022 Prepared by: Ethelene Browns  Exercises - Hooklying Hamstring Stretch with Strap  - 3 x daily - 7 x weekly - 1 sets - 1 reps - 30 hold  PATIENT EDUCATION: Education  details: HEP for bil. Hamstrings and heel cord stretches (Medbridge & handout) Person educated: Patient Education method: Explanation, Demonstration, and Handouts Education comprehension: verbalized understanding and  returned demonstration   HOME EXERCISE PROGRAM: See above - Medbridge HEP and sheet with hamstrings and heel cord stretches    GOALS: Goals reviewed with patient? Yes  SHORT TERM GOALS: Target date: 06/08/2022  Pt will be independent in HEP for bil. LE stretching and strengthening.  Baseline: Goal status: INITIAL  2.  Pt will report reduced bil. Leg pain to </= 6/10 for increased tolerance and comfort with mobility and ADL's. Baseline: 9/10 in legs at eval on 05-10-22 Goal status: INITIAL  3.  Initiate aquatic PT for less pain produced with active exercise/ambulation. Baseline:  Goal status: INITIAL   LONG TERM GOALS: Target date: 07/06/2022  Independent in updated HEP for bil. LE stretching, strengthening and balance exercises. Baseline:  Goal status: INITIAL  2. Pt will report at least 50% improvement in bil. Leg pain for increased comfort and tolerance with mobility and ADL's. Baseline:  Goal status: INITIAL  3.   Pt will amb. 10" nonstop without LOB with c/o leg pain </= 4/10. Baseline:  Goal status: INITIAL  4.  Pt will participate in aquatic exercise session with c/o pain in legs < 3/10.   Baseline: 9/10 c/o pain in bil. Legs on land Goal status: INITIAL  ASSESSMENT:  CLINICAL IMPRESSION: PT session focused on establishing HEP for bil. LE strengthening in both anti-gravity positions and also in standing for closed chain strengthening exercises with use of red theraband for PRE's.  Pt tolerated exercises well with reps held to 5 only to minimize occurrence of cramping in each leg.  Pt reported less pain in today's session than in previous session at initial evaluation.  Will cont to progress exercises and increase # of reps as tolerated.  Cont with  POC.   OBJECTIVE IMPAIRMENTS decreased balance, difficulty walking, decreased strength, increased muscle spasms, impaired flexibility, postural dysfunction, and pain.   ACTIVITY LIMITATIONS bending, squatting, stairs, and locomotion level  PARTICIPATION LIMITATIONS: cleaning, laundry, shopping, and community activity  PERSONAL FACTORS Fitness, Past/current experiences, Time since onset of injury/illness/exacerbation, and 1 comorbidity: unknown etiology of pain  are also affecting patient's functional outcome.   REHAB POTENTIAL: Good  CLINICAL DECISION MAKING: Evolving/moderate complexity  EVALUATION COMPLEXITY: Moderate  PLAN: PT FREQUENCY: 1x/week  PT DURATION: 8 weeks  PLANNED INTERVENTIONS: Therapeutic exercises, Therapeutic activity, Neuromuscular re-education, Balance training, Gait training, Patient/Family education, Self Care, Stair training, and Aquatic Therapy  PLAN FOR NEXT SESSION: check HEP issued on 05-17-22 - cont LE stretching and strengthening; issue balance exercises   Latrenda Irani, [place], PT 05/18/2022, 11:15 AM

## 2022-05-24 ENCOUNTER — Ambulatory Visit: Payer: No Typology Code available for payment source | Admitting: Physical Therapy

## 2022-05-28 ENCOUNTER — Ambulatory Visit
Admission: RE | Admit: 2022-05-28 | Discharge: 2022-05-28 | Disposition: A | Discharge status: Home / Self Care | Payer: No Typology Code available for payment source | Source: Ambulatory Visit | Attending: Family Medicine | Admitting: Family Medicine

## 2022-05-28 ENCOUNTER — Encounter (INDEPENDENT_AMBULATORY_CARE_PROVIDER_SITE_OTHER): Payer: Self-pay

## 2022-05-28 DIAGNOSIS — R928 Other abnormal and inconclusive findings on diagnostic imaging of breast: Secondary | ICD-10-CM

## 2022-05-28 DIAGNOSIS — N6489 Other specified disorders of breast: Secondary | ICD-10-CM | POA: Diagnosis not present

## 2022-05-30 ENCOUNTER — Encounter
Payer: No Typology Code available for payment source | Attending: Physical Medicine and Rehabilitation | Admitting: Physical Medicine and Rehabilitation

## 2022-05-30 ENCOUNTER — Encounter: Payer: Self-pay | Admitting: Physical Medicine and Rehabilitation

## 2022-05-30 DIAGNOSIS — R269 Unspecified abnormalities of gait and mobility: Secondary | ICD-10-CM | POA: Insufficient documentation

## 2022-05-30 DIAGNOSIS — G609 Hereditary and idiopathic neuropathy, unspecified: Secondary | ICD-10-CM | POA: Diagnosis not present

## 2022-05-30 DIAGNOSIS — M79604 Pain in right leg: Secondary | ICD-10-CM | POA: Diagnosis not present

## 2022-05-30 DIAGNOSIS — R600 Localized edema: Secondary | ICD-10-CM | POA: Diagnosis not present

## 2022-05-30 DIAGNOSIS — M79605 Pain in left leg: Secondary | ICD-10-CM | POA: Insufficient documentation

## 2022-05-30 DIAGNOSIS — E876 Hypokalemia: Secondary | ICD-10-CM | POA: Diagnosis not present

## 2022-05-30 NOTE — Progress Notes (Signed)
Subjective:    Patient ID: Jennifer Davila, female    DOB: [birth year], 66 y.o.   MRN: [id number]  HPI  Jennifer Davila is a 66 y.o. year old female  who  has a past medical history of Arthritis, Constipation, GERD (gastroesophageal reflux disease), Hyperlipidemia, Hypertension, Trouble swallowing (2013), and Urticaria.   They are presenting to PM&R clinic as a new patient for bilateral leg pain, etiology uncertain, ddx includes neuropathic pain vs. Arthritic vs. D/t edema.    Plan for Bilateral leg pain form last visit: Assessment & Plan: Uncertain etiology at this time; has components of arthritic pain (worse with prolonged sitting/laying, improved with activity) and neuropathic pain (burning/tingling). Some contribution of leg edema >3 months to discomfort as well.   Reviewed results of Xr lumbar spine 12/2020 and MRI lumbar spine 12/2017, with independent review of images. Agree with mild anteriolisthesis at L4-5, with some mild neuroforaminal narrowing at R L4-5 and L5-S1. No indications of spinal stenosis.   Ordered EMG bilateral lower extremities to evaluate for peripheral neuropathy vs. Radiculopathy; patient will follow up with me 1-2 weeks after to discuss results.   Advised patient to use voltaren gel OTC on her legs 2-4x daily for adjunctive pain control. She may continue PRN tylenol. Defer to PCP whether to continue once weekly oral diclofenac.   Patient only used gabapentin for sleep QHS in the past; will re-initiate with 300 mg BID (renal function 02/2022 labs reviewed). Patient will call in 1-2 weeks and, of tolerating well, increase to 300 mg Am / 600 mg PM.   Interval Hx:  - EMG showed peripheral neuropathy, possible c/b edema. No findings indicating radiculopathy.   - Good progress in PT with HEP and rec'd aquatherapy per notes; pt states she enjoys the exercises that she gets and has helped her mobilize more at home. She does her HEP in the morning and with  commercials on the TV 2-3x per day. She wants to go 1-2 more sessions with PT and then transition completely to HEP.   - She has not seen a difference in her pain with the increased gabapentin. She states Motrin does more for her pain control than the gabapentin. She has been taking it consistently.  - She takes 2x 500 mg Tylenol in the morning and at night. She also takes 2x Motrin 400 mg tablets rarely, took one on Sunday. She says the motrin helps her the most. She denies any increased swelling with the Motrin. Motrin will bring her pain down form an 8/10 to 5/10. She has not noticed if her edema worsens on days when she takes it.   - Regarding edema, she has been using compression stockings intermittently. She says they cause marks on her legs so she doesn't always use them. Does elevate her legs at home, but still wakes up with edema. She has not discussed initiating a diuretic with her PCP; she does endorse Hx hypokalemia, and was prescribed spirinolactone but stopped it after a few doses because "I was reading online that it could cause heart palpitations". She denies any side effects when she was on the medication.   - She reports a recent fall at church where she was leaning forward to pick up candy and fell. Her legs went out from under her, and she hit her jaw. She denies any syncope or presyncope.   Pain Inventory Average Pain 7 Pain Right Now 7 My pain is dull  In the last 24 hours, has  pain interfered with the following? General activity 0 Relation with others 0 Enjoyment of life 0 What TIME of day is your pain at its worst? morning , daytime, evening, and night Sleep (in general) Fair  Pain is worse with: sitting and standing Pain improves with: medication Relief from Meds: 0  Family History  Problem Relation Age of Onset   Hypertension Mother    Stroke Mother    Diabetes Father    Alcohol abuse Father    Kidney disease Father    Colon polyps Brother    Cancer Brother     Alcohol abuse Brother    Social History   Socioeconomic History   Marital status: Married    Spouse name: Not on file   Number of children: 1   Years of education: Not on file   Highest education level: Not on file  Occupational History   Not on file  Tobacco Use   Smoking status: Former    Types: Cigarettes    Quit date: 10/02/1972    Years since quitting: 49.6   Smokeless tobacco: Never  Vaping Use   Vaping Use: Never used  Substance and Sexual Activity   Alcohol use: No   Drug use: No   Sexual activity: Never  Other Topics Concern   Not on file  Social History Narrative   Not on file   Social Determinants of Health   Financial Resource Strain: Low Risk  (02/19/2022)   Overall Financial Resource Strain (CARDIA)    Difficulty of Paying Living Expenses: Not hard at all  Food Insecurity: No Food Insecurity (02/19/2022)   Hunger Vital Sign    Worried About Running Out of Food in the Last Year: Never true    Ran Out of Food in the Last Year: Never true  Transportation Needs: No Transportation Needs (02/19/2022)   PRAPARE - Hydrologist (Medical): No    Lack of Transportation (Non-Medical): No  Physical Activity: Inactive (02/19/2022)   Exercise Vital Sign    Days of Exercise per Week: 0 days    Minutes of Exercise per Session: 0 min  Stress: No Stress Concern Present (02/19/2022)   [place]    Feeling of Stress : Not at all  Social Connections: [place] (02/19/2022)   Social Connection and Isolation Panel [NHANES]    Frequency of Communication with Friends and Family: More than three times a week    Frequency of Social Gatherings with Friends and Family: More than three times a week    Attends Religious Services: More than 4 times per year    Active Member of Clubs or Organizations: Yes    Attends Music therapist: More than 4 times per year     Marital Status: Married   Past Surgical History:  Procedure Laterality Date   ABDOMINAL HYSTERECTOMY     BREAST BIOPSY Left 11/04/2019    BENIGN LYMPH NODE WITH PARACORTICAL HYPERPLASIA   CHOLECYSTECTOMY     COLONOSCOPY N/A 01/08/2018   Procedure: COLONOSCOPY;  Surgeon: Yetta Flock, MD;  Location: [facility];  Service: Gastroenterology;  Laterality: N/A;   LAPAROSCOPIC GASTRIC BANDING     LAPAROSCOPIC REPAIR AND REMOVAL OF GASTRIC BAND  2013-2014   Past Surgical History:  Procedure Laterality Date   ABDOMINAL HYSTERECTOMY     BREAST BIOPSY Left 11/04/2019    BENIGN LYMPH NODE WITH PARACORTICAL HYPERPLASIA   CHOLECYSTECTOMY  COLONOSCOPY N/A 01/08/2018   Procedure: COLONOSCOPY;  Surgeon: Yetta Flock, MD;  Location: [facility];  Service: Gastroenterology;  Laterality: N/A;   LAPAROSCOPIC GASTRIC BANDING     LAPAROSCOPIC REPAIR AND REMOVAL OF GASTRIC BAND  2013-2014   Past Medical History:  Diagnosis Date   Arthritis    Constipation    on stool softener   GERD (gastroesophageal reflux disease)    Hyperlipidemia    Hypertension    Trouble swallowing 2013   following lap band   Urticaria    Ht '5\' 2"'$  (1.575 m)   BMI 45.36 kg/m   Opioid Risk Score:   Fall Risk Score:  `1  Depression screen PHQ 2/9     05/16/2022    2:30 PM 04/25/2022   10:54 AM 02/19/2022    2:21 PM 02/12/2022   10:51 AM 12/19/2020    2:07 PM 12/19/2020    2:03 PM 08/27/2019    6:37 PM  Depression screen PHQ 2/9  Decreased Interest 0 0 0 0 0 0 0  Down, Depressed, Hopeless 0 0 0 0 0 0 0  PHQ - 2 Score 0 0 0 0 0 0 0  Altered sleeping  1       Tired, decreased energy  1       Change in appetite  1       Feeling bad or failure about yourself   0       Trouble concentrating  0       Moving slowly or fidgety/restless  0       Suicidal thoughts  0       PHQ-9 Score  3       Difficult doing work/chores  Not difficult at all           Review of Systems  Constitutional:  Positive  for fatigue.  Cardiovascular:  Positive for leg swelling. Negative for chest pain and palpitations.  Gastrointestinal:  Negative for constipation.  Musculoskeletal:  Positive for back pain, gait problem, joint swelling and myalgias.  Neurological:  Positive for numbness. Negative for dizziness and syncope.  All other systems reviewed and are negative.      Objective:   Physical Exam  Physical Exam Constitution: Appropriate appearance for age. No apparnet distress +Obese HEENT: PERRL, EOMI grossly intact.  Resp: CTAB. No rales, rhonchi, or wheezing. Cardio: RRR. No mumurs, rubs, or gallops. Bilaterally LE pitting edema 3+; nonpitting on bilateral feet Abdomen: Nondistended. Nontender. +bowel sounds. Psych: Appropriate mood and affect.   Neurologic Exam:   DTRs: Reflexes were 1+ in bilateral lower extremities. Sensory exam: revealed decreased sensation to light touch in the lateral ankles only in bilateral  lower extremities. Motor exam: strength normal in all myotomal regions of bilateral lower extremities in knee extension, dorsiflexion, plantarflexion, and EHL. Coordination: Fine motor coordination was normal.         Assessment & Plan:  KINGA CASSAR is a 66 y.o. year old female  who  has a past medical history of Arthritis, Constipation, GERD (gastroesophageal reflux disease), Hyperlipidemia, Hypertension, Trouble swallowing (2013), and Urticaria.   They are presenting to PM&R clinic for follow up of bilateral leg pain, etiology likely peripheral neuropathy c/b extensive LE edema.   Bilateral leg pain Assessment & Plan: - Mixed etiology with peripheral neuropathy c/b overlying peripheral edema, with possible arthritic pain  - EMG results indicate peripheral polyneuropathy, without signs of lumbosacral radiculopathy, although NCS study was difficult d/t overlying edema.    -  Follow up with me in clinic in 3 months. Call before then if any concerns, and you can see one of  my colleagues sooner.   Orders: -     Comprehensive metabolic panel -     Magnesium -     Hemoglobin A1c -     B12 and Folate Panel -     TSH -     T4, free -     Iron, TIBC and Ferritin Panel -     CBC  Bilateral lower extremity edema Assessment & Plan: Advised patient to resume spironolactone per PCP, as Hx hypokalemia makes lasix poor choice for edema management.  Continue use of compression stockings during daytime. Elevate legs with pillows in bed to raise above heart.   Pending insurance coverage for duplexes per PCP  Orders: -     Comprehensive metabolic panel -     Magnesium -     Hemoglobin A1c -     B12 and Folate Panel -     TSH -     T4, free -     Iron, TIBC and Ferritin Panel -     CBC  Gait difficulty Assessment & Plan: Continue PT with goal development of HEP for gait stability  Orders: -     Comprehensive metabolic panel -     Magnesium -     Hemoglobin A1c -     B12 and Folate Panel -     TSH -     T4, free -     Iron, TIBC and Ferritin Panel  Hypokalemia -     Comprehensive metabolic panel -     Magnesium -     Hemoglobin A1c -     B12 and Folate Panel -     TSH -     T4, free -     Iron, TIBC and Ferritin Panel  Hereditary and idiopathic peripheral neuropathy -     Comprehensive metabolic panel -     Magnesium -     Hemoglobin A1c -     B12 and Folate Panel -     TSH -     T4, free -     Iron, TIBC and Ferritin Panel -     [place], DO 05/30/2022

## 2022-05-30 NOTE — Patient Instructions (Addendum)
Today, I ordered bloodwork for your kidney, liver, electrolytes, and other causes of peripheral neuropathy. When these results are back, I will give you a call and we can review medication options. These include possibly changing your Motrin to Celebrex, or initiating a different medication for nerve pain, such as amitriptyline. I can send these results to your family doctor.   For your leg swelling, continue using compression socks and elevate your legs whenever possible. Resume Spironolactone per your PCP, which may help with your swelling as well.  Continue PT and develop a home program of exercises to help with strength and stretching.  You may try capsaicin cream over the counter on your feet and legs for neuropathy. Apply and leave on for 10-15 minutes before removing; there will be a burning sensation, but this is part of the treatment. If you cannot tolerate capsaicin cream, you can also try voltaren gel to your legs 2-4x daily.   Follow up with me in clinic in 3 months. Call before then if any concerns, and you can see one of my colleagues sooner.

## 2022-05-30 NOTE — Assessment & Plan Note (Addendum)
-   Mixed etiology with peripheral neuropathy c/b overlying peripheral edema, with possible arthritic pain  - EMG results indicate peripheral polyneuropathy, without signs of lumbosacral radiculopathy, although NCS study was difficult d/t overlying edema.    - Follow up with me in clinic in 3 months. Call before then if any concerns, and you can see one of my colleagues sooner.

## 2022-05-30 NOTE — Assessment & Plan Note (Signed)
Advised patient to resume spironolactone per PCP, as Hx hypokalemia makes lasix poor choice for edema management.  Continue use of compression stockings during daytime. Elevate legs with pillows in bed to raise above heart.   Pending insurance coverage for duplexes per PCP

## 2022-05-30 NOTE — Assessment & Plan Note (Signed)
Continue PT with goal development of HEP for gait stability

## 2022-05-31 ENCOUNTER — Ambulatory Visit
Payer: No Typology Code available for payment source | Attending: Physical Medicine and Rehabilitation | Admitting: Physical Therapy

## 2022-05-31 ENCOUNTER — Telehealth: Payer: Self-pay | Admitting: Physical Medicine and Rehabilitation

## 2022-05-31 DIAGNOSIS — R262 Difficulty in walking, not elsewhere classified: Secondary | ICD-10-CM | POA: Insufficient documentation

## 2022-05-31 DIAGNOSIS — M79605 Pain in left leg: Secondary | ICD-10-CM | POA: Insufficient documentation

## 2022-05-31 DIAGNOSIS — M79604 Pain in right leg: Secondary | ICD-10-CM | POA: Insufficient documentation

## 2022-05-31 DIAGNOSIS — G609 Hereditary and idiopathic neuropathy, unspecified: Secondary | ICD-10-CM

## 2022-05-31 LAB — IRON,TIBC AND FERRITIN PANEL
Ferritin: 85 ng/mL (ref 15–150)
Iron Saturation: 10 % — ABNORMAL LOW (ref 15–55)
Iron: 36 ug/dL (ref 27–139)
Total Iron Binding Capacity: 369 ug/dL (ref 250–450)
UIBC: 333 ug/dL (ref 118–369)

## 2022-05-31 LAB — CBC
Hematocrit: 39.2 % (ref 34.0–46.6)
Hemoglobin: 12.1 g/dL (ref 11.1–15.9)
MCH: 23 pg — ABNORMAL LOW (ref 26.6–33.0)
MCHC: 30.9 g/dL — ABNORMAL LOW (ref 31.5–35.7)
MCV: 75 fL — ABNORMAL LOW (ref 79–97)
Platelets: 312 10*3/uL (ref 150–450)
RBC: 5.26 x10E6/uL (ref 3.77–5.28)
RDW: 14.8 % (ref 11.7–15.4)
WBC: 6.8 10*3/uL (ref 3.4–10.8)

## 2022-05-31 LAB — TSH: TSH: 3.24 u[IU]/mL (ref 0.450–4.500)

## 2022-05-31 LAB — COMPREHENSIVE METABOLIC PANEL
ALT: 13 IU/L (ref 0–32)
AST: 16 IU/L (ref 0–40)
Albumin/Globulin Ratio: 1.6 (ref 1.2–2.2)
Albumin: 4.3 g/dL (ref 3.9–4.9)
Alkaline Phosphatase: 131 IU/L — ABNORMAL HIGH (ref 44–121)
BUN/Creatinine Ratio: 13 (ref 12–28)
BUN: 13 mg/dL (ref 8–27)
Bilirubin Total: 0.2 mg/dL (ref 0.0–1.2)
CO2: 27 mmol/L (ref 20–29)
Calcium: 8.9 mg/dL (ref 8.7–10.3)
Chloride: 101 mmol/L (ref 96–106)
Creatinine, Ser: 0.97 mg/dL (ref 0.57–1.00)
Globulin, Total: 2.7 g/dL (ref 1.5–4.5)
Glucose: 92 mg/dL (ref 70–99)
Potassium: 3.7 mmol/L (ref 3.5–5.2)
Sodium: 142 mmol/L (ref 134–144)
Total Protein: 7 g/dL (ref 6.0–8.5)
eGFR: 64 mL/min/{1.73_m2} (ref >59)

## 2022-05-31 LAB — B12 AND FOLATE PANEL
Folate: 11.3 ng/mL (ref >3.0)
Vitamin B-12: 444 pg/mL (ref 232–1245)

## 2022-05-31 LAB — HEMOGLOBIN A1C
Est. average glucose Bld gHb Est-mCnc: 128 mg/dL
Hgb A1c MFr Bld: 6.1 % — ABNORMAL HIGH (ref 4.8–5.6)

## 2022-05-31 LAB — MAGNESIUM: Magnesium: 2 mg/dL (ref 1.6–2.3)

## 2022-05-31 LAB — T4, FREE: Free T4: 1.22 ng/dL (ref 0.82–1.77)

## 2022-05-31 MED ORDER — AMITRIPTYLINE HCL 25 MG PO TABS: 25.0000 mg | ORAL_TABLET | Freq: Every day | ORAL | 2 refills | Status: DC

## 2022-05-31 NOTE — Telephone Encounter (Signed)
Called patient to review labs an medication recommendations, went to VM with patient identifier. Left a VM with the following information:  - Labs look overall very good. One mildly elevated liver function test (alp), in the absence of other abnormal tests isn't overly concerning. HA1C was 6.1. Thyroid, anemia, iron, magnesium, and B12/folate studies looked good.  - Because of her edema, I am opting to start her on a TCA rather than an NSAID. I sent a script to her pharmacy for Elavil 25 mg QHS #30 tabs R2. I reviewed common side effects and asked her to call the office if any questions or concerns.    Gertie Gowda, DO 05/31/2022

## 2022-06-07 ENCOUNTER — Ambulatory Visit: Payer: No Typology Code available for payment source | Admitting: Physical Therapy

## 2022-06-07 DIAGNOSIS — M79604 Pain in right leg: Secondary | ICD-10-CM

## 2022-06-07 DIAGNOSIS — M79605 Pain in left leg: Secondary | ICD-10-CM | POA: Diagnosis not present

## 2022-06-07 DIAGNOSIS — R262 Difficulty in walking, not elsewhere classified: Secondary | ICD-10-CM | POA: Diagnosis not present

## 2022-06-07 NOTE — Patient Instructions (Signed)
Standing Marching   Using a chair if necessary, march in place. Repeat 10 times. Do 1 sessions per day.  http://gt2.exer.us/344   Copyright  VHI. All rights reserved.   Hip Backward Kick   Using a chair for balance, keep legs shoulder width apart and toes pointed for- ward. Slowly extend one leg back, keeping knee straight. Do not lean forward. Repeat with other leg. Repeat 10 times. Do 1 sessions per day.        Hip Side Kick   Holding a chair for balance, keep legs shoulder width apart and toes pointed forward. Swing a leg out to side, keeping knee straight. Do not lean. Repeat using other leg. Repeat 10 times. Do 1 sessions per day.  http://gt2.exer.us/342   ALSO DO FORWARD KICKS - 10 reps each     Standing On One Leg Without Support .  Stand on one leg in neutral spine without support. Hold 10 seconds. Repeat on other leg. Do 2 repetitions, 1 sets.  http://bt.exer.us/36   Copyright  VHI. All rights reserved.    Side-Stepping   Walk to left side with eyes open. Take even steps, leading with same foot. Make sure each foot lifts off the floor. Repeat in opposite direction. Do 1 sessions per day.

## 2022-06-07 NOTE — Therapy (Signed)
OUTPATIENT PHYSICAL THERAPY NEURO TREATMENT NOTE   Patient Name: Jennifer Davila MRN: [id number] DOB:09-Feb-1956, 66 y.o., female Today's Date: 06/08/2022   PCP: Martinique, [name], MD REFERRING PROVIDER: Gertie Gowda, DO    PT End of Session - 06/08/22 1645     Visit Number 3    Number of Visits 9    Date for PT Re-Evaluation 07/06/22    Authorization Type [employer] [place]    PT Start Time 1403    PT Stop Time 4098    PT Time Calculation (min) 42 min    Activity Tolerance Patient tolerated treatment well    Behavior During Therapy [facility] for tasks assessed/performed               Past Medical History:  Diagnosis Date   Arthritis    Constipation    on stool softener   GERD (gastroesophageal reflux disease)    Hyperlipidemia    Hypertension    Trouble swallowing 2013   following lap band   Urticaria    Past Surgical History:  Procedure Laterality Date   ABDOMINAL HYSTERECTOMY     BREAST BIOPSY Left 11/04/2019    BENIGN LYMPH NODE WITH PARACORTICAL HYPERPLASIA   CHOLECYSTECTOMY     COLONOSCOPY N/A 01/08/2018   Procedure: COLONOSCOPY;  Surgeon: Yetta Flock, MD;  Location: [facility];  Service: Gastroenterology;  Laterality: N/A;   LAPAROSCOPIC GASTRIC BANDING     LAPAROSCOPIC REPAIR AND REMOVAL OF GASTRIC BAND  2013-2014   Patient Active Problem List   Diagnosis Date Noted   Hereditary and idiopathic peripheral neuropathy 05/30/2022   Osteopenia 05/14/2022   Gait difficulty 04/25/2022   Nephrolithiasis 03/27/2022   COVID-19 virus infection 07/19/2021   GI bleed 07/18/2021   Prediabetes 05/30/2021   Bilateral leg pain 06/03/2020   Chronic urticaria 05/26/2020   Seasonal and perennial allergic rhinitis 05/26/2020   Positive ANA (antinuclear antibody) 05/26/2020   Iron deficiency anemia 07/07/2018   Hematochezia 06/26/2018   Diverticulosis of colon with hemorrhage    Leg pain 01/06/2018   Bilateral lower extremity edema 01/06/2018    Lower GI bleed 01/06/2018   Hypokalemia 01/06/2018   Constipation 01/06/2018   Leg cramping 10/18/2017   Vitamin D deficiency, unspecified 10/18/2017   GERD (gastroesophageal reflux disease) 10/18/2017   Hypertension, essential, benign 09/04/2017   Hyperlipidemia 09/04/2017   Morbid obesity ([place]) 09/04/2017   Vertigo 09/04/2017   Hip pain 05/25/2015    ONSET DATE: early 2022: Referral date 04-25-22  REFERRING DIAG: Diagnosis M79.604,M79.605 (ICD-10-CM) - Bilateral leg pain R26.9 (ICD-10-CM) - Gait difficulty   THERAPY DIAG:  Difficulty in walking, not elsewhere classified  Pain in left leg  Pain in right leg  Rationale for Evaluation and Treatment Rehabilitation  SUBJECTIVE:  SUBJECTIVE STATEMENT:    Pt reports doing the exercises some but has been busy caring for her husband - had to cancel her PT appt last week because she had to take him to appt. At [facility] in [place]; states she had EMG and NCV done - showed neuropathy   Pt accompanied by: self  PERTINENT HISTORY: Arthritis, Constipation, GERD (gastroesophageal reflux disease), Hyperlipidemia, Hypertension, Trouble swallowing (2013), and Urticaria      PAIN:  Are you having pain? Yes: NPRS scale: 5/10 Pain location: Bil. legs Pain description: tightness Aggravating factors: prolonged sitting and walking Relieving factors: Gabapentin  PRECAUTIONS: None  WEIGHT BEARING RESTRICTIONS No  FALLS: Has patient fallen in last 6 months? No  LIVING ENVIRONMENT: Lives with: lives with their spouse Lives in: House/apartment Stairs: Yes: External: 6 steps; on right going up Has following equipment at home: None  PLOF: Independent  PATIENT GOALS HEP for LE stretching and strengthening  OBJECTIVE:    TODAY'S TREATMENT:  Pt  performed bil. LE stretching - runner's stretch and gastroc stretch in standing at counter - 1 rep each leg each stretch 20 sec hold  Heel raises 10 reps bil. LE's together;  attempted each leg unilaterally but pt reported feeling cramp starting to occur  Standing exercises for balance training:  forward, back and side kicks 10 reps each leg with 1 minimal UE support on counter Marching in place 10 reps:  marching forwards along counter 2 reps with UE support  Pt stood with feet together EO without instability; progressed to partial tandem stance 30 secs each foot position  SLS - 1 rep each foot - 10 sec hold with UE support on counter prn    Leg press 60# 3 sets 10 reps  Nustep level 3 x 5" seat at 8, handles at 9     Balance exercises issued to HEP - 06-07-22 Issued these exs. In addition to previous HEP;  [place] [email or web address] (05-17-22) Access Code: [email or web address] URL: [email or web address] Date: 05/18/2022 Prepared by: Ethelene Browns  Exercises - Seated Knee Extension with Resistance  - 1 x daily - 7 x weekly - 1 sets - 10 reps - Seated Hamstring Curl with Anchored Resistance  - 1 x daily - 7 x weekly - 1 sets - 10 reps - Standing Hip Flexion with Resistance  - 1 x daily - 7 x weekly - 1 sets - 10 reps - Standing Hip Extension with Resistance  - 1 x daily - 7 x weekly - 3 sets - 10 reps - Standing Hip Abduction with Resistance  - 1 x daily - 7 x weekly - 1 sets - 10 reps - Beginner Bridge  - 1 x daily - 7 x weekly - 1 sets - 10 reps - Active Straight Leg Raise with Quad Set  - 1 x daily - 7 x weekly - 1 sets - 10 reps - Sidelying Hip Abduction  - 1 x daily - 7 x weekly - 1 sets - 10 reps - Hip and Knee Flexion with Anchored Resistance  - 1 x daily - 7 x weekly - 1 sets - 10 reps  GAIT: Gait pattern: WFL with occasional unsteadiness but pt able to recover independently    05-10-22:   Medbridge HEP and pt was given sheet with hamstring/heel cord stretches Access Code:  [email or web address] URL: [email or web address] Date: 05/11/2022 Prepared by: Ethelene Browns  Exercises - Hooklying Hamstring Stretch with Strap  - 3 x daily - 7 x weekly - 1 sets -  1 reps - 30 hold  PATIENT EDUCATION: Education details: HEP for bil. Hamstrings and heel cord stretches (Medbridge & handout) Person educated: Patient Education method: Explanation, Demonstration, and Handouts Education comprehension: verbalized understanding and returned demonstration   HOME EXERCISE PROGRAM: See above - Medbridge HEP and sheet with hamstrings and heel cord stretches    GOALS: Goals reviewed with patient? Yes  SHORT TERM GOALS: Target date: 06/08/2022  Pt will be independent in HEP for bil. LE stretching and strengthening.  Baseline: Goal status: INITIAL  2.  Pt will report reduced bil. Leg pain to </= 6/10 for increased tolerance and comfort with mobility and ADL's. Baseline: 9/10 in legs at eval on 05-10-22 Goal status: INITIAL  3.  Initiate aquatic PT for less pain produced with active exercise/ambulation. Baseline:  Goal status: INITIAL   LONG TERM GOALS: Target date: 07/06/2022  Independent in updated HEP for bil. LE stretching, strengthening and balance exercises. Baseline:  Goal status: INITIAL  2. Pt will report at least 50% improvement in bil. Leg pain for increased comfort and tolerance with mobility and ADL's. Baseline:  Goal status: INITIAL  3.   Pt will amb. 10" nonstop without LOB with c/o leg pain </= 4/10. Baseline:  Goal status: INITIAL  4.  Pt will participate in aquatic exercise session with c/o pain in legs < 3/10.   Baseline: 9/10 c/o pain in bil. Legs on land Goal status: INITIAL  ASSESSMENT:  CLINICAL IMPRESSION: PT session focused on balance training and also on LE strengthening and stretching.  Balance exercises added to HEP - pt needs UE support for safety.  Pt able to perform leg press exercise and Nustep without difficulty - instructed  in use of this equipment for continuation of these exercises at [facility] where she is currently a member. Pt tolerated exercises well.    Cont with POC.   OBJECTIVE IMPAIRMENTS decreased balance, difficulty walking, decreased strength, increased muscle spasms, impaired flexibility, postural dysfunction, and pain.   ACTIVITY LIMITATIONS bending, squatting, stairs, and locomotion level  PARTICIPATION LIMITATIONS: cleaning, laundry, shopping, and community activity  PERSONAL FACTORS Fitness, Past/current experiences, Time since onset of injury/illness/exacerbation, and 1 comorbidity: unknown etiology of pain  are also affecting patient's functional outcome.   REHAB POTENTIAL: Good  CLINICAL DECISION MAKING: Evolving/moderate complexity  EVALUATION COMPLEXITY: Moderate  PLAN: PT FREQUENCY: 1x/week  PT DURATION: 8 weeks  PLANNED INTERVENTIONS: Therapeutic exercises, Therapeutic activity, Neuromuscular re-education, Balance training, Gait training, Patient/Family education, Self Care, Stair training, and Aquatic Therapy  PLAN FOR NEXT SESSION: check HEP issued on 06-07-22 - cont LE stretching and strengthening; issue balance exercises   Jihaad Bruschi, [place], PT 06/08/2022, 4:47 PM

## 2022-06-08 ENCOUNTER — Encounter: Payer: Self-pay | Admitting: Physical Therapy

## 2022-06-08 ENCOUNTER — Telehealth: Payer: Self-pay | Admitting: Pharmacist

## 2022-06-08 NOTE — Chronic Care Management (AMB) (Signed)
    Chronic Care Management Pharmacy Assistant   Name: Jennifer Davila  MRN: [id number] DOB: [birth year]  06/11/2022 APPOINTMENT REMINDER  Jennifer Davila was reminded to have all medications, supplements and any blood glucose and blood pressure readings available for review with Jennifer Davila, [name], at her telephone visit on 06/11/2022 at 1:00.  Care Gaps: AWV - scheduled 02/22/2023 Last BP - 176/99 on 05/30/2022 Last A1C - 6.1 on 05/30/2022 Covid - overdue Flu - due Shingrix - postponed  Star Rating Drug: Pravastatin 20 mg - last filled 04/01/2022 [facility] at [facility] Losartan 100 mg - last filled 04/11/2022 90 [facility] at [facility]  Any gaps in medications fill history? No  Jennifer Davila [facility]  Catering manager [phone]

## 2022-06-11 ENCOUNTER — Telehealth: Payer: No Typology Code available for payment source

## 2022-06-11 ENCOUNTER — Telehealth: Payer: Self-pay | Admitting: Pharmacist

## 2022-06-11 NOTE — Telephone Encounter (Signed)
  Chronic Care Management   Outreach Note  06/11/2022 Name: AMIE COWENS MRN: [id number] DOB: Aug 29, 1955  Referred by: [place]ique, [name], MD  Patient had a phone appointment scheduled with clinical pharmacist today.  An unsuccessful telephone outreach was attempted today. The patient was referred to the pharmacist for assistance with care management and care coordination.   If possible, a message was left to return call to: [phone] or to [facility] at [facility]: [place], PharmD, [place] Pharmacist [place] [facility] [place]

## 2022-06-11 NOTE — Progress Notes (Deleted)
Chronic Care Management Pharmacy Note  06/11/2022 Name:  Jennifer Davila MRN:  [id number] DOB:  12-25-1955  Summary: LDL not ideally at goal < 100 Pt is not checking BP at home  Recommendations/Changes made from today's visit: -Recommend limiting use of NSAIDs -Recommended routine monitoring of BP at home -Consider trial of magnesium OTC for muscle spasms -Recommended spacing out calcium to 600 mg BID  Plan: BP assessment in 3 months Follow up in 6 months   Subjective: Jennifer Davila is an 66 y.o. year old female who is a primary patient of Martinique, Malka So, MD.  The CCM team was consulted for assistance with disease management and care coordination needs.    Engaged with patient by telephone for follow up visit in response to provider referral for pharmacy case management and/or care coordination services.   Consent to Services:  The patient was given information about Chronic Care Management services, agreed to services, and gave verbal consent prior to initiation of services.  Please see initial visit note for detailed documentation.   Patient Care Team: Martinique, [name], MD as PCP - General (Family Medicine) Viona Gilmore, [facility] as Pharmacist (Pharmacist)  Recent office visits: 03/27/22 [name] MD - Patient was seen for chronic conditions follow up. Referred to pain clinic. Follow up in 5 months.  02/19/2022 Rolene Arbour LPN - Medicare annual wellness exam.   02/12/2022 [name] MD - Patient was seen for Pain in both lower extremities and additional concerns. Started Omeprazole 40 mg and Pregabalin 25 mg. Discontinued Nexium and Gabapentin.  Recent consult visits: 06/07/22 Guido Sander, PT (outpatient rehab): Patient presented for PT session for difficulty walking.  05/30/22 Durel Salts, DO (physical medicine): Patient presented for bilateral leg pain. Advised patient to resume spironolactone per PCP. Follow up in 3 months.  05/16/22 Durel Salts,  DO (physical medicine): Patient presented for bilateral leg pain.   05/30/22 Durel Salts, DO (physical medicine): Patient presented for bilateral leg pain initial consult. Prescribed gabapentin 300 mg BID. Advised patient to use voltaren gel OTC on her legs 2-4x daily for adjunctive pain control.   Hospital visits: Presented to [facility] ED on 03/06/2022 due to kidney stone. Present for 10 hours.  New?Medications Started at [facility] Discharge:?? Tramadol PRN Diclofenac 100 mg daily Tamsulosin 0.4 mg daily Ondansetron PRN Medication Changes at Hospital Discharge: None Medications Discontinued at Hospital Discharge: None Medications that remain the same after Hospital Discharge:??  -All other medications will remain the same.     Objective:  Lab Results  Component Value Date   CREATININE 0.97 05/30/2022   BUN 13 05/30/2022   GFR 61.88 08/01/2021   GFRNONAA 51 (L) 03/07/2022   GFRAA 68 04/19/2020   NA 142 05/30/2022   K 3.7 05/30/2022   CALCIUM 8.9 05/30/2022   CO2 27 05/30/2022   GLUCOSE 92 05/30/2022    Lab Results  Component Value Date/Time   HGBA1C 6.1 (H) 05/30/2022 12:36 PM   HGBA1C 6.2 11/01/2021 10:40 AM   GFR 61.88 08/01/2021 11:31 AM   GFR 63.56 05/30/2021 11:38 AM    Last diabetic Eye exam: No results found for: "HMDIABEYEEXA"  Last diabetic Foot exam: No results found for: "HMDIABFOOTEX"   Lab Results  Component Value Date   CHOL 173 11/01/2021   HDL 40.60 11/01/2021   LDLCALC 101 (H) 11/01/2021   TRIG 155.0 (H) 11/01/2021   CHOLHDL 4 11/01/2021       Latest Ref Rng & Units  05/30/2022   12:36 PM 07/19/2021    4:36 AM 07/18/2021    1:57 PM  Hepatic Function  Total Protein 6.0 - 8.5 g/dL 7.0  7.8  7.9   Albumin 3.9 - 4.9 g/dL 4.3  4.1  4.2   AST 0 - 40 IU/L _0 ALT 0 - 32 IU/L _1 Alk Phosphatase 44 - 121 IU/L 131  93  99   Total Bilirubin 0.0 - 1.2 mg/dL 0.2  0.9  0.7     Lab Results  Component Value  Date/Time   TSH 3.240 05/30/2022 12:36 PM   TSH 5.709 (H) 08/04/2015 12:08 PM   FREET4 1.22 05/30/2022 12:36 PM       Latest Ref Rng & Units 05/30/2022   12:35 PM 03/07/2022   12:49 AM 08/01/2021   11:31 AM  CBC  WBC 3.4 - 10.8 x10E3/uL 6.8  10.6  6.4   Hemoglobin 11.1 - 15.9 g/dL 12.1  12.9  11.0   Hematocrit 34.0 - 46.6 % 39.2  42.4  35.2   Platelets 150 - 450 x10E3/uL 312  341  308.0     Lab Results  Component Value Date/Time   VD25OH 11.51 (L) 11/01/2021 10:40 AM   VD25OH 38 02/23/2020 10:47 AM   VD25OH 26.17 (L) 12/22/2019 11:53 AM    Clinical ASCVD: No  The 10-year ASCVD risk score (Arnett DK, et al., 2019) is: 18.1%   Values used to calculate the score:     Age: 16 years     Sex: Female     Is Non-Hispanic African American: Yes     Diabetic: No     Tobacco smoker: No     Systolic Blood Pressure: 096 mmHg     Is BP treated: Yes     HDL Cholesterol: 40.6 mg/dL     Total Cholesterol: 173 mg/dL       05/16/2022    2:30 PM 04/25/2022   10:54 AM 02/19/2022    2:21 PM  Depression screen PHQ 2/9  Decreased Interest 0 0 0  Down, Depressed, Hopeless 0 0 0  PHQ - 2 Score 0 0 0  Altered sleeping  1   Tired, decreased energy  1   Change in appetite  1   Feeling bad or failure about yourself   0   Trouble concentrating  0   Moving slowly or fidgety/restless  0   Suicidal thoughts  0   PHQ-9 Score  3   Difficult doing work/chores  Not difficult at all       Social History   Tobacco Use  Smoking Status Former   Types: Cigarettes   Quit date: 10/02/1972   Years since quitting: 49.7  Smokeless Tobacco Never   BP Readings from Last 3 Encounters:  05/30/22 (!) 176/99  05/16/22 (!) 174/96  04/25/22 (!) 155/95   Pulse Readings from Last 3 Encounters:  05/30/22 84  05/16/22 86  04/25/22 70   Wt Readings from Last 3 Encounters:  05/30/22 248 lb (112.5 kg)  05/16/22 248 lb (112.5 kg)  04/25/22 246 lb (111.6 kg)   BMI Readings from Last 3 Encounters:  05/30/22  45.36 kg/m  05/16/22 45.36 kg/m  04/25/22 44.99 kg/m    Assessment/Interventions: Review of patient past medical history, allergies, medications, health status, including review of consultants reports, laboratory and other test data, was performed as part of comprehensive evaluation and provision of chronic care  management services.   SDOH:  (Social Determinants of Health) assessments and interventions performed: Yes (last 02/19/22) SDOH Interventions    Flowsheet Row Clinical Support from 02/19/2022 in [place] at [place] Management from 02/02/2021 in [place] at [place] from 12/19/2020 in [place] at [place] Interventions Intervention Not Indicated -- --  Housing Interventions Intervention Not Indicated -- Intervention Not Indicated  Transportation Interventions Intervention Not Indicated Intervention Not Indicated Intervention Not Indicated  Financial Strain Interventions Intervention Not Indicated Intervention Not Indicated --  Physical Activity Interventions Intervention Not Indicated -- Intervention Not Indicated  Stress Interventions Intervention Not Indicated -- --  Social Connections Interventions Intervention Not Indicated -- Intervention Not Indicated       SDOH Screenings   Food Insecurity: No Food Insecurity (02/19/2022)  Housing: Low Risk  (02/19/2022)  Transportation Needs: No Transportation Needs (02/19/2022)  Alcohol Screen: Low Risk  (02/19/2022)  Depression (PHQ2-9): Low Risk  (05/16/2022)  Financial Resource Strain: Low Risk  (02/19/2022)  Physical Activity: Inactive (02/19/2022)  Social Connections: Socially Integrated (02/19/2022)  Stress: No Stress Concern Present (02/19/2022)  Tobacco Use: Medium Risk (06/08/2022)   [place]  No Known Allergies  Medications Reviewed Today     Reviewed by Alda Lea, PT (Physical Therapist) on 06/08/22 at  [place] Status: <None>   Medication Order Taking? Sig Documenting Provider Last Dose Status Informant  acetaminophen (TYLENOL) 500 MG tablet [id number] No Take 1-2 tablets (500-1,000 mg total) by mouth daily as needed for moderate pain (pain). Newt Minion, MD Taking Active Self  amitriptyline (ELAVIL) 25 MG tablet [id number]  Take 1 tablet (25 mg total) by mouth at bedtime. Gertie Gowda, DO  Active   amLODipine (NORVASC) 5 MG tablet [id number] No Take 0.5 tablets (2.5 mg total) by mouth daily.  Patient taking differently: Take 5 mg by mouth daily.   [place], Suann Larry, MD Taking Active Self  calcium carbonate (OSCAL) 1500 (600 Ca) MG TABS tablet [id number] No Take 1 tablet by mouth 2 (two) times daily with a meal. [provider] Taking Active Self  Diclofenac Sodium CR 100 MG 24 hr tablet [id number] No Take 1 tablet (100 mg total) by mouth daily. Palumbo, April, MD Taking Active   gabapentin (NEURONTIN) 600 MG tablet [id number] No Take 0.5 tablets (300 mg total) by mouth 2 (two) times daily. Call office after 1-2 weeks to report any concerns or side effects. If tolerating well, may increase evening dose to 1 tablet after 2 weeks. Gertie Gowda, DO Taking Active   ibuprofen (ADVIL) 400 MG tablet [id number] No TAKE 1 TABLET (400 MG TOTAL) BY MOUTH DAILY AS NEEDED.  Patient taking differently: Take 400 mg by mouth daily as needed for moderate pain.   Martinique, [name], MD Taking Active Self  losartan (COZAAR) 100 MG tablet [id number] No TAKE 1 TABLET BY MOUTH EVERY DAY  Patient taking differently: Take 100 mg by mouth daily.   Martinique, [name], MD Taking Active Self  Magnesium 250 MG TABS [id number] No Take 250 mg by mouth daily. [provider] Taking Active Self  omeprazole (PRILOSEC) 40 MG capsule [id number] No Take 1 capsule (40 mg total) by mouth daily. Martinique, [name], MD Taking Active Self  ondansetron (ZOFRAN-ODT) 8 MG disintegrating tablet [id number] No 9m ODT q8  hours prn nausea Palumbo, April, MD Taking Active   polyethylene glycol (MIRALAX / GLYCOLAX) 17  g packet [id number] No Take 17 g by mouth every other day. [provider] Taking Active Self  potassium chloride (KLOR-CON) 10 MEQ tablet [id number] No TAKE 1 TABLET BY MOUTH EVERY DAY  Patient taking differently: Take 10 mEq by mouth daily.   Martinique, [name], MD Taking Active Self  pravastatin (PRAVACHOL) 20 MG tablet [id number] No TAKE 1 TABLET BY MOUTH EVERY DAY Martinique, [name], MD Taking Active Self  spironolactone (ALDACTONE) 25 MG tablet [id number] No TAKE 1 TABLET (25 MG TOTAL) BY MOUTH DAILY. Martin[place], [name], MD Taking Active   tamsulosin [facility]) 0.4 MG CAPS capsule [id number] No Take 1 capsule (0.4 mg total) by mouth daily. Palumbo, April, MD Taking Active   traMADol (ULTRAM) 50 MG tablet [id number] No Take 1 tablet (50 mg total) by mouth every 6 (six) hours as needed. Randal Buba April, MD Taking Active             Patient Active Problem List   Diagnosis Date Noted   Hereditary and idiopathic peripheral neuropathy 05/30/2022   Osteopenia 05/14/2022   Gait difficulty 04/25/2022   Nephrolithiasis 03/27/2022   COVID-19 virus infection 07/19/2021   GI bleed 07/18/2021   Prediabetes 05/30/2021   Bilateral leg pain 06/03/2020   Chronic urticaria 05/26/2020   Seasonal and perennial allergic rhinitis 05/26/2020   Positive ANA (antinuclear antibody) 05/26/2020   Iron deficiency anemia 07/07/2018   Hematochezia 06/26/2018   Diverticulosis of colon with hemorrhage    Leg pain 01/06/2018   Bilateral lower extremity edema 01/06/2018   Lower GI bleed 01/06/2018   Hypokalemia 01/06/2018   Constipation 01/06/2018   Leg cramping 10/18/2017   Vitamin D deficiency, unspecified 10/18/2017   GERD (gastroesophageal reflux disease) 10/18/2017   Hypertension, essential, benign 09/04/2017   Hyperlipidemia 09/04/2017   Morbid obesity ([place]) 09/04/2017   Vertigo 09/04/2017   Hip pain 05/25/2015     Immunization History  Administered Date(s) Administered   Fluad Quad(high Dose 65+) 06/15/2020, 05/30/2021   Hepatitis B, adult 10/13/2019, 11/17/2019, 05/18/2020   Influenza, High Dose Seasonal PF 04/29/2019   Influenza,inj,Quad PF,6+ Mos 05/23/2015, 05/03/2016, 04/23/2018   Moderna Sars-Covid-2 Vaccination 01/08/2020, 02/05/2020   PPD Test 05/14/2018   Pneumococcal Conjugate-13 08/04/2015   Pneumococcal Polysaccharide-23 08/26/2019   Tdap 05/23/2015   Patient reports her husband has not been doing well and she has [employer] coming into help with bathing and will be coming out once a week. Patient's husband is also doing physical therapy.  -BP? Very high with last readings  BP Readings from Last 3 Encounters:  05/30/22 (!) 176/99  05/16/22 (!) 174/96  04/25/22 (!) 155/95   -needs to limit use of NSAIDs due to increasing BP and GI bleeding history - did she try diclofenac?  -also what happened with vitamin D supplement? Removed 2000 units from her list but should be taking  Last vitamin D Lab Results  Component Value Date   VD25OH 11.51 (L) 11/01/2021   -needs repeat vitamin D  -did she restart spironolactone?  -amlodipine last filled - 12/19/20 90 ds 5 mg   -Recommend limiting use of NSAIDs -Recommended routine monitoring of BP at home -Consider trial of magnesium OTC for muscle spasms -Recommended spacing out calcium to 600 mg BID  Conditions to be addressed/monitored:  Hypertension, Hyperlipidemia, GERD, and Pain and prediabetes  Conditions addressed this visit: Hypertension, hyperlipidemia  There are no care plans that you recently modified to display for this patient.    Medication Assistance: None required.  Patient  affirms current coverage meets needs.  Compliance/Adherence/Medication fill history: Care Gaps: Shingrix, COVID booster, influenza Last BP - 176/99 on 05/30/2022 Last A1C - 6.1 on 05/30/2022  Star-Rating Drugs: Pravastatin 20 mg - last  [facility] Losartan 100 mg - last filled [facility]/11/[facility]22 [facility]  [facility] preferred [facility]:  [id number] [place] [address]Phone: [phone]: [phone] [facility] [address]Phone: [phone]: [phone] [place]Phone: [phone]: [phone]  Uses pill box? No - keeps them beside her Pt endorses 100% compliance  We discussed: Benefits of medication synchronization, packaging and delivery as well as enhanced pharmacist oversight with [employer]. Patient decided to: Continue current medication management strategy  Care Plan and Follow Up Patient Decision:  Patient agrees to Care Plan and Follow-up.  Plan: Telephone follow up appointment with care management team member scheduled for:  6 months  MJeni Salles PharmD, BMegargelPharmacist [phone]

## 2022-06-18 ENCOUNTER — Telehealth: Payer: Self-pay | Admitting: Pharmacist

## 2022-06-18 NOTE — Chronic Care Management (AMB) (Signed)
    Chronic Care Management Pharmacy Assistant   Name: LAYLIANA DEVINS  MRN: [id number] DOB: 07-03-1956  Reason for Encounter: Reschedule missed appointment   Rescheduled with patient her appointment with [name] Clinical Pharmacist to 07/03/2022.   [place] Pharmacist Assistant [phone]

## 2022-06-23 ENCOUNTER — Other Ambulatory Visit: Payer: Self-pay | Admitting: Physical Medicine and Rehabilitation

## 2022-06-28 ENCOUNTER — Ambulatory Visit: Payer: No Typology Code available for payment source | Admitting: Physical Therapy

## 2022-07-02 ENCOUNTER — Telehealth: Payer: Self-pay | Admitting: Pharmacist

## 2022-07-02 NOTE — Chronic Care Management (AMB) (Signed)
    Chronic Care Management Pharmacy Assistant   Name: Jennifer Davila  MRN: [id number] DOB: 14-Jun-1956  07/03/2022 APPOINTMENT REMINDER  [name] was reminded to have all medications, supplements and any blood glucose and blood pressure readings available for review with [name], [name], at her telephone visit on 07/03/2022 at 3:30.  Care Gaps: AWV - scheduled 02/22/2023 Last BP - 176/99 on 05/30/2022 Last A1C - 6.1 on 05/30/2022 Flu - due Covid - overdue Shingrix - postponed  Star Rating Drug: Pravastatin 20 mg - last filled [facility] Losartan 100 mg - last filled [facility]11/[facility]  Any gaps in medications fill history? No  [name]  Catering manager [phone]

## 2022-07-03 ENCOUNTER — Ambulatory Visit (INDEPENDENT_AMBULATORY_CARE_PROVIDER_SITE_OTHER): Payer: No Typology Code available for payment source | Admitting: Pharmacist

## 2022-07-03 DIAGNOSIS — I10 Essential (primary) hypertension: Secondary | ICD-10-CM

## 2022-07-03 DIAGNOSIS — E785 Hyperlipidemia, unspecified: Secondary | ICD-10-CM

## 2022-07-03 NOTE — Progress Notes (Signed)
Chronic Care Management Pharmacy Note  07/04/2022 Name:  Jennifer Davila MRN:  [id number] DOB:  01-16-1956  Summary: Pt is non compliant with medications Pt is not checking BP at home  Recommendations/Changes made from today's visit: -Recommended restarting amlodipine -Recommended at least weekly monitoring of BP at home -Recommended restarting calcium 600 mg BID and vitamin D 2000 units daily -Recommend repeat vitamin D level  Plan: Scheduled PCP follow up in 1 month BP assessment in 2 months Follow up in 4 months   Subjective: Jennifer Davila is an 66 y.o. year old female who is a primary patient of Martinique, Malka So, MD.  The CCM team was consulted for assistance with disease management and care coordination needs.    Engaged with patient by telephone for follow up visit in response to provider referral for pharmacy case management and/or care coordination services.   Consent to Services:  The patient was given information about Chronic Care Management services, agreed to services, and gave verbal consent prior to initiation of services.  Please see initial visit note for detailed documentation.   Patient Care Team: Martinique, [name], MD as PCP - General (Family Medicine) Viona Gilmore, [facility] as Pharmacist (Pharmacist)  Recent office visits: 03/27/22 [name] MD - Patient was seen for chronic conditions follow up. Referred to pain clinic. Follow up in 5 months.  02/19/2022 Rolene Arbour LPN - Medicare annual wellness exam.   02/12/2022 [name] MD - Patient was seen for Pain in both lower extremities and additional concerns. Started Omeprazole 40 mg and Pregabalin 25 mg. Discontinued Nexium and Gabapentin.  Recent consult visits: 06/07/22 Guido Sander, PT (outpatient rehab): Patient presented for PT session for difficulty walking.  05/30/22 Durel Salts, DO (physical medicine): Patient presented for bilateral leg pain. Advised patient to resume  spironolactone per PCP. Follow up in 3 months.  05/16/22 Durel Salts, DO (physical medicine): Patient presented for bilateral leg pain.   05/30/22 Durel Salts, DO (physical medicine): Patient presented for bilateral leg pain initial consult. Prescribed gabapentin 300 mg BID. Advised patient to use voltaren gel OTC on her legs 2-4x daily for adjunctive pain control.   Hospital visits: Presented to [facility] ED on 03/06/2022 due to kidney stone. Present for 10 hours.  New?Medications Started at [facility] Discharge:?? Tramadol PRN Diclofenac 100 mg daily Tamsulosin 0.4 mg daily Ondansetron PRN Medication Changes at Hospital Discharge: None Medications Discontinued at Hospital Discharge: None Medications that remain the same after Hospital Discharge:??  -All other medications will remain the same.     Objective:  Lab Results  Component Value Date   CREATININE 0.97 05/30/2022   BUN 13 05/30/2022   GFR 61.88 08/01/2021   GFRNONAA 51 (L) 03/07/2022   GFRAA 68 04/19/2020   NA 142 05/30/2022   K 3.7 05/30/2022   CALCIUM 8.9 05/30/2022   CO2 27 05/30/2022   GLUCOSE 92 05/30/2022    Lab Results  Component Value Date/Time   HGBA1C 6.1 (H) 05/30/2022 12:36 PM   HGBA1C 6.2 11/01/2021 10:40 AM   GFR 61.88 08/01/2021 11:31 AM   GFR 63.56 05/30/2021 11:38 AM    Last diabetic Eye exam: No results found for: "HMDIABEYEEXA"  Last diabetic Foot exam: No results found for: "HMDIABFOOTEX"   Lab Results  Component Value Date   CHOL 173 11/01/2021   HDL 40.60 11/01/2021   LDLCALC 101 (H) 11/01/2021   TRIG 155.0 (H) 11/01/2021   CHOLHDL 4 11/01/2021  Latest Ref Rng & Units 05/30/2022   12:36 PM 07/19/2021    4:36 AM 07/18/2021    1:57 PM  Hepatic Function  Total Protein 6.0 - 8.5 g/dL 7.0  7.8  7.9   Albumin 3.9 - 4.9 g/dL 4.3  4.1  4.2   AST 0 - 40 IU/L _0 ALT 0 - 32 IU/L _1 Alk Phosphatase 44 - 121 IU/L 131  93  99   Total  Bilirubin 0.0 - 1.2 mg/dL 0.2  0.9  0.7     Lab Results  Component Value Date/Time   TSH 3.240 05/30/2022 12:36 PM   TSH 5.709 (H) 08/04/2015 12:08 PM   FREET4 1.22 05/30/2022 12:36 PM       Latest Ref Rng & Units 05/30/2022   12:35 PM 03/07/2022   12:49 AM 08/01/2021   11:31 AM  CBC  WBC 3.4 - 10.8 x10E3/uL 6.8  10.6  6.4   Hemoglobin 11.1 - 15.9 g/dL 12.1  12.9  11.0   Hematocrit 34.0 - 46.6 % 39.2  42.4  35.2   Platelets 150 - 450 x10E3/uL 312  341  308.0     Lab Results  Component Value Date/Time   VD25OH 11.51 (L) 11/01/2021 10:40 AM   VD25OH 38 02/23/2020 10:47 AM   VD25OH 26.17 (L) 12/22/2019 11:53 AM    Clinical ASCVD: No  The 10-year ASCVD risk score (Arnett DK, et al., 2019) is: 18.1%   Values used to calculate the score:     Age: 68 years     Sex: Female     Is Non-Hispanic African American: Yes     Diabetic: No     Tobacco smoker: No     Systolic Blood Pressure: 412 mmHg     Is BP treated: Yes     HDL Cholesterol: 40.6 mg/dL     Total Cholesterol: 173 mg/dL       05/16/2022    2:30 PM 04/25/2022   10:54 AM 02/19/2022    2:21 PM  Depression screen PHQ 2/9  Decreased Interest 0 0 0  Down, Depressed, Hopeless 0 0 0  PHQ - 2 Score 0 0 0  Altered sleeping  1   Tired, decreased energy  1   Change in appetite  1   Feeling bad or failure about yourself   0   Trouble concentrating  0   Moving slowly or fidgety/restless  0   Suicidal thoughts  0   PHQ-9 Score  3   Difficult doing work/chores  Not difficult at all       Social History   Tobacco Use  Smoking Status Former   Types: Cigarettes   Quit date: 10/02/1972   Years since quitting: 49.7  Smokeless Tobacco Never   BP Readings from Last 3 Encounters:  05/30/22 (!) 176/99  05/16/22 (!) 174/96  04/25/22 (!) 155/95   Pulse Readings from Last 3 Encounters:  05/30/22 84  05/16/22 86  04/25/22 70   Wt Readings from Last 3 Encounters:  05/30/22 248 lb (112.5 kg)  05/16/22 248 lb (112.5 kg)   04/25/22 246 lb (111.6 kg)   BMI Readings from Last 3 Encounters:  05/30/22 45.36 kg/m  05/16/22 45.36 kg/m  04/25/22 44.99 kg/m    Assessment/Interventions: Review of patient past medical history, allergies, medications, health status, including review of consultants reports, laboratory and other test data, was performed as part of comprehensive evaluation  and provision of chronic care management services.   SDOH:  (Social Determinants of Health) assessments and interventions performed: Yes (last 02/19/22) SDOH Interventions    Flowsheet Row Clinical Support from 02/19/2022 in [place] [facility] from 02/02/2021 in [place] at [place] from 12/19/2020 in [place] [facility] [place] Interventions Intervention Not Indicated -- --  Housing Interventions Intervention Not Indicated -- Intervention Not Indicated  Transportation Interventions Intervention Not Indicated Intervention Not Indicated Intervention Not Indicated  Financial Strain Interventions Intervention Not Indicated Intervention Not Indicated --  Physical Activity Interventions Intervention Not Indicated -- Intervention Not Indicated  Stress Interventions Intervention Not Indicated -- --  Social Connections Interventions Intervention Not Indicated -- Intervention Not Indicated       SDOH Screenings   Food Insecurity: No Food Insecurity (02/19/2022)  Housing: Low Risk  (02/19/2022)  Transportation Needs: No Transportation Needs (02/19/2022)  Alcohol Screen: Low Risk  (02/19/2022)  Depression (PHQ2-9): Low Risk  (05/16/2022)  Financial Resource Strain: Low Risk  (02/19/2022)  Physical Activity: Inactive (02/19/2022)  Social Connections: Socially Integrated (02/19/2022)  Stress: No Stress Concern Present (02/19/2022)  Tobacco Use: Medium Risk (06/08/2022)   [place]  No Known Allergies  Medications Reviewed Today      Reviewed by Viona Gilmore, [facility] (Pharmacist) on 07/03/22 at 1614  Med List Status: <None>   Medication Order Taking? Sig Documenting Provider Last Dose Status Informant  acetaminophen (TYLENOL) 500 MG tablet [id number] Yes Take 1-2 tablets (500-1,000 mg total) by mouth daily as needed for moderate pain (pain). Newt Minion, MD Taking Active Self  amitriptyline (ELAVIL) 25 MG tablet [id number] Yes TAKE 1 TABLET BY MOUTH EVERYDAY AT BEDTIME Durel Salts C, DO Taking Active   amLODipine (NORVASC) 5 MG tablet [id number] No Take 0.5 tablets (2.5 mg total) by mouth daily.  Patient not taking: Reported on 07/03/2022   Edwin Dada, MD Not Taking Active Self  calcium carbonate (OSCAL) 1500 (600 Ca) MG TABS tablet [id number] No Take 1 tablet by mouth 2 (two) times daily with a meal.  Patient not taking: Reported on 07/03/2022   [provider] Not Taking Active Self  diclofenac Sodium (VOLTAREN) 1 % GEL [id number] Yes Apply 2 g topically 4 (four) times daily. [provider] Taking Active   ibuprofen (ADVIL) 400 MG tablet [id number] Yes TAKE 1 TABLET (400 MG TOTAL) BY MOUTH DAILY AS NEEDED.  Patient taking differently: Take 400 mg by mouth daily as needed for moderate pain.   Martinique, [name], MD Taking Active Self  losartan (COZAAR) 100 MG tablet [id number]  TAKE 1 TABLET BY MOUTH EVERY DAY  Patient taking differently: Take 100 mg by mouth daily.   Martinique, [name], MD  Active Self  omeprazole (PRILOSEC) 40 MG capsule [id number]  Take 1 capsule (40 mg total) by mouth daily. Martinique, [name], MD  Active Self  polyethylene glycol (MIRALAX / GLYCOLAX) 17 g packet [id number]  Take 17 g by mouth every other day. [provider]  Active Self  potassium chloride (KLOR-CON) 10 MEQ tablet [id number] Yes TAKE 1 TABLET BY MOUTH EVERY DAY  Patient taking differently: Take 10 mEq by mouth daily.   Martinique, [name], MD Taking Active Self  pravastatin (PRAVACHOL) 20 MG tablet  [id number]  TAKE 1 TABLET BY MOUTH EVERY DAY Martinique, [name], MD  Active Self  spironolactone (ALDACTONE) 25 MG tablet [id number] Yes TAKE 1  TABLET (25 MG TOTAL) BY MOUTH DAILY. Martinique, [name], MD Taking Active   tamsulosin [facility]) 0.4 MG CAPS capsule [id number]  Take 1 capsule (0.4 mg total) by mouth daily. Palumbo, April, MD  Active   traMADol (ULTRAM) 50 MG tablet [id number]  Take 1 tablet (50 mg total) by mouth every 6 (six) hours as needed. Randal Buba April, MD  Active             Patient Active Problem List   Diagnosis Date Noted   Hereditary and idiopathic peripheral neuropathy 05/30/2022   Osteopenia 05/14/2022   Gait difficulty 04/25/2022   Nephrolithiasis 03/27/2022   COVID-19 virus infection 07/19/2021   GI bleed 07/18/2021   Prediabetes 05/30/2021   Bilateral leg pain 06/03/2020   Chronic urticaria 05/26/2020   Seasonal and perennial allergic rhinitis 05/26/2020   Positive ANA (antinuclear antibody) 05/26/2020   Iron deficiency anemia 07/07/2018   Hematochezia 06/26/2018   Diverticulosis of colon with hemorrhage    Leg pain 01/06/2018   Bilateral lower extremity edema 01/06/2018   Lower GI bleed 01/06/2018   Hypokalemia 01/06/2018   Constipation 01/06/2018   Leg cramping 10/18/2017   Vitamin D deficiency, unspecified 10/18/2017   GERD (gastroesophageal reflux disease) 10/18/2017   Hypertension, essential, benign 09/04/2017   Hyperlipidemia 09/04/2017   Morbid obesity ([place]) 09/04/2017   Vertigo 09/04/2017   Hip pain 05/25/2015    Immunization History  Administered Date(s) Administered   Fluad Quad(high Dose 65+) 06/15/2020, 05/30/2021   Hepatitis B, adult 10/13/2019, 11/17/2019, 05/18/2020   Influenza, High Dose Seasonal PF 04/29/2019   Influenza,inj,Quad PF,6+ Mos 05/23/2015, 05/03/2016, 04/23/2018   Moderna Sars-Covid-2 Vaccination 01/08/2020, 02/05/2020   PPD Test 05/14/2018   Pneumococcal Conjugate-13 08/04/2015   Pneumococcal Polysaccharide-23 08/26/2019    Tdap 05/23/2015   Patient reports things have been 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 She is bringing her husband in for an MRI. Patient is feeling more stressed with everything going on.  Patient reports the amitriptyline is helping and it makes her sleepy. She is doing PT during the day and is going to start aquatherapy soon for this as well. She reports her thighs have been begun to age. Patient tried the capsaicin and did not help. She did buy the diclofenac gel and will use it now as needed. Patient is only using the ibuprofen on Saturdays.  Patient is not checking BP at home. Patient tends to not want to check because she doesn't want to see numbers. Patient is using an arm cuff for her husband.  Patient stopped taking amlodipine and is unsure why. She has a few tablets on hand right now and was taking 1/2 tablet prior to stopping it on her own.   Conditions to be addressed/monitored:  Hypertension, Hyperlipidemia, GERD, and Pain and prediabetes  Conditions addressed this visit: Hypertension, hyperlipidemia  Care Plan : CCM Pharmacy Care Plan  Updates made by Viona Gilmore, [place] since 07/04/2022 12:00 AM     Problem: Problem: Hypertension, Hyperlipidemia, GERD, and Pain and prediabetes      Long-Range Goal: Patient-Specific Goal   Start Date: 02/02/2021  Expected End Date: 02/02/2022  Recent Progress: On track  Priority: High  Note:   Current Barriers:  Unable to independently afford treatment regimen Unable to independently monitor therapeutic efficacy Unable to achieve control of cholesterol   Pharmacist Clinical Goal(s):  Patient will verbalize ability to afford treatment regimen achieve adherence to monitoring guidelines and medication adherence to achieve therapeutic  efficacy achieve control of cholesterol as evidenced by next lipid panel maintain control of blood pressure as evidenced by home BP readings  through collaboration with PharmD and  provider.   Interventions: 1:1 collaboration with Martinique, [name], MD regarding development and update of comprehensive plan of care as evidenced by provider attestation and co-signature Inter-disciplinary care team collaboration (see longitudinal plan of care) Comprehensive medication review performed; medication list updated in electronic medical record  Hypertension (BP goal <140/90) -Not ideally controlled -Current treatment: Losartan 100 mg 1 tablet daily - Appropriate, Query effective, Safe, Accessible Amlodipine 5 mg 1 tablet daily at bedtime - Appropriate, Query effective, Safe, Accessible - not taking Spironolactone 25 mg 1 tablet daily - Appropriate, Query effective, Safe, Accessible -Medications previously tried: HCTZ (low potassium)  -Current home readings: does not check at home -Current dietary habits: tries not to cook with salt; does eat red meat and hamburgers and not often chicken or ground [place] -Current exercise habits: does not exercise -Denies hypotensive/hypertensive symptoms -Educated on BP goals and benefits of medications for prevention of heart attack, stroke and kidney damage; Daily salt intake goal < 2300 mg; Exercise goal of 150 minutes per week; Importance of home blood pressure monitoring; Proper BP monitoring technique; -Counseled to monitor BP at home weekly, document, and provide log at future appointments -Counseled on diet and exercise extensively Recommended to continue current medication Recommended restarting amlodipine.  Hyperlipidemia: (LDL goal < 100) -Not ideally controlled -Current treatment: Pravastatin 20 mg 1 tablet daily - Appropriate, Query effective, Safe, Accessible -Medications previously tried:  none -Current dietary patterns: no fried foods; uses vegetable oil instead of canola or olive oil due to cost; eats some fruit and vegetables (doesn't like oatmeal) -Current exercise habits: does not exercise -Educated on Cholesterol  goals;  Benefits of statin for ASCVD risk reduction; Importance of limiting foods high in cholesterol; Exercise goal of 150 minutes per week; -Counseled on diet and exercise extensively Recommended to continue current medication  Pre-diabetes (A1c goal <6.5%) -Controlled -Current medications: No medications -Medications previously tried: none  -Current home glucose readings fasting glucose: does not need to check post prandial glucose: does not need to check -Denies hypoglycemic/hyperglycemic symptoms -Current meal patterns:  breakfast: sometimes she skips or eats 2 pieces of bread  lunch: does not eat  dinner: only meal of the day - protein and tries to have a vegetable snacks: cheezits, grapes drinks: water, coffee, some juice -Current exercise: does not exercise -Educated on A1c and blood sugar goals; Exercise goal of 150 minutes per week; Benefits of weight loss; Carbohydrate counting and/or plate method -Counseled to check feet daily and get yearly eye exams -Counseled on diet and exercise extensively Counseled on adding protein to meals and eating smaller meals throughout the day instead of one big meal. Patient will try to move her snacks to earlier in the day instead of bedtime and will add veggies instead of fruit & cheezits (both carbs)  GERD (Goal: minimize symptoms) -Controlled -Current treatment  Esomeprazole 40 mg 1 capsule daily - Appropriate, Effective, Query Safe, Accessible -Medications previously tried: none  -Counseled on non-pharmacologic management of symptoms such as elevating the head of your bed, avoiding eating 2-3 hours before bed, avoiding triggering foods such as acidic, spicy, or fatty foods, eating smaller meals, and wearing clothes that are loose around the waist Recommended considering long term risks of PPIs and taper in the future to see how she tolerates. Patient preferred to hold off for now.  Pain (Goal: minimize  pain) -Uncontrolled -Current treatment  Acetaminophen 500 mg 1-2 tablets daily as needed for pain - Appropriate, Effective, Safe, Accessible Ibuprofen 400 mg daily as needed (on Sundays) - Appropriate, Effective, Query Safe, Accessible Amitriptyline 25 mg 1 tablet at bedtime - Appropriate, Effective, Safe, Accessible -Medications previously tried: tizanidine (made her drowsy)  -Counseled on maximum of 3000 mg of Tylenol per day (6 tablets) and can take every 4-6 hours as needed.  Health Maintenance -Vaccine gaps: shingrix (has not had chicken pox), COVID booster, influenza -Current therapy:  Potassium 10 mEq 1 tablet daily -Educated on Cost vs benefit of each product must be carefully weighed by individual consumer -Patient is satisfied with current therapy and denies issues -Recommended to continue current medication  Patient Goals/Self-Care Activities Patient will:  - take medications as prescribed check blood pressure weekly, document, and provide at future appointments target a minimum of 150 minutes of moderate intensity exercise weekly  Follow Up Plan: The care management team will reach out to the patient again over the next 60 days.        Medication Assistance: None required.  Patient affirms current coverage meets needs.  Compliance/Adherence/Medication fill history: Care Gaps: Shingrix, COVID booster, influenza Last BP - 176/99 on 05/30/2022 Last A1C - 6.1 on 05/30/2022  Star-Rating Drugs: Pravastatin 20 mg - [facility] Losartan 100 mg - last filled [facility]/11/2022 [facility]  [facility]  [id number] [place] [place]Phone: [phone]: [phone] [place]Phone: [phone]: [phone] [place]Phone: [phone]: [phone]  Uses pill box? No - keeps them beside her Pt endorses 100% compliance  We discussed: Benefits of medication synchronization, packaging and delivery as well as enhanced pharmacist oversight with [employer]. Patient decided to: Continue current medication management strategy  Care Plan and Follow Up Patient Decision:  Patient agrees to Care Plan and Follow-up.  Plan: The care management team will reach out to the patient again over the next 60 days.  MJeni Salles PharmD, BGodleyPharmacist [place]

## 2022-07-04 ENCOUNTER — Telehealth: Payer: Self-pay

## 2022-07-04 DIAGNOSIS — I10 Essential (primary) hypertension: Secondary | ICD-10-CM

## 2022-07-04 MED ORDER — AMLODIPINE BESYLATE 5 MG PO TABS: 2.5000 mg | ORAL_TABLET | Freq: Every day | ORAL | 1 refills | Status: DC

## 2022-07-04 NOTE — Telephone Encounter (Signed)
-----   Message from Viona Gilmore, [facility] sent at 07/04/2022  8:56 AM EST ----- Regarding: Amlodipine refill Hi,  Ms. Kott is almost out of amlodipine. Before the prescriber that's listed on there (he was temporary and saw her when she had COVID), Dr. Martinique was prescribing it. [facility]?   Thanks! Maddie

## 2022-07-04 NOTE — Patient Instructions (Signed)
Hi Jennifer Davila,  It was great to catch up again! Please work on taking your blood pressure at least once a week because we want to make sure your medications are working properly.   Please reach out to me if you have any questions or need anything before our follow up!  Best, Maddie  [name], PharmD, [place] Pharmacist [place] [facility] [place]

## 2022-07-19 ENCOUNTER — Telehealth: Payer: Self-pay | Admitting: Pharmacist

## 2022-07-19 NOTE — Chronic Care Management (AMB) (Signed)
    Chronic Care Management Pharmacy Assistant   Name: Jennifer Davila  MRN: [id number] DOB: 1956-02-11  Reason for Encounter: Follow up blood pressures  Patient restarted Amlodipine 5 mg 1/2 daily on 07/04/22  Current antihypertensive regimen:  Amlodipine 5 mg 1/2 daily  Losartan 100 mg daily Spironolactone 25 mg daily  Current home BP readings: Patient states she has not been checking blood pressures and and unable to check today, she will be out of town until after the 1st of the year. I will check back with the patient after the new year.    [place] Pharmacist Assistant [phone]

## 2022-07-27 ENCOUNTER — Emergency Department (HOSPITAL_COMMUNITY)
Admission: EM | Admit: 2022-07-27 | Discharge: 2022-07-28 | Disposition: A | Discharge status: Home / Self Care | Payer: No Typology Code available for payment source | Attending: Emergency Medicine | Admitting: Emergency Medicine

## 2022-07-27 ENCOUNTER — Other Ambulatory Visit: Payer: Self-pay

## 2022-07-27 DIAGNOSIS — K5790 Diverticulosis of intestine, part unspecified, without perforation or abscess without bleeding: Secondary | ICD-10-CM

## 2022-07-27 DIAGNOSIS — I10 Essential (primary) hypertension: Secondary | ICD-10-CM | POA: Insufficient documentation

## 2022-07-27 DIAGNOSIS — K625 Hemorrhage of anus and rectum: Secondary | ICD-10-CM | POA: Diagnosis not present

## 2022-07-27 DIAGNOSIS — Z79899 Other long term (current) drug therapy: Secondary | ICD-10-CM | POA: Diagnosis not present

## 2022-07-27 DIAGNOSIS — K5791 Diverticulosis of intestine, part unspecified, without perforation or abscess with bleeding: Secondary | ICD-10-CM | POA: Insufficient documentation

## 2022-07-27 LAB — CBC WITH DIFFERENTIAL/PLATELET
Abs Immature Granulocytes: 0.03 10*3/uL (ref 0.00–0.07)
Basophils Absolute: 0 10*3/uL (ref 0.0–0.1)
Basophils Relative: 1 %
Eosinophils Absolute: 0.2 10*3/uL (ref 0.0–0.5)
Eosinophils Relative: 2 %
HCT: 39.6 % (ref 36.0–46.0)
Hemoglobin: 12.1 g/dL (ref 12.0–15.0)
Immature Granulocytes: 0 %
Lymphocytes Relative: 21 %
Lymphs Abs: 1.8 10*3/uL (ref 0.7–4.0)
MCH: 23.2 pg — ABNORMAL LOW (ref 26.0–34.0)
MCHC: 30.6 g/dL (ref 30.0–36.0)
MCV: 75.9 fL — ABNORMAL LOW (ref 80.0–100.0)
Monocytes Absolute: 0.6 10*3/uL (ref 0.1–1.0)
Monocytes Relative: 7 %
Neutro Abs: 5.7 10*3/uL (ref 1.7–7.7)
Neutrophils Relative %: 69 %
Platelets: 284 10*3/uL (ref 150–400)
RBC: 5.22 MIL/uL — ABNORMAL HIGH (ref 3.87–5.11)
RDW: 15.4 % (ref 11.5–15.5)
WBC: 8.3 10*3/uL (ref 4.0–10.5)
nRBC: 0 % (ref 0.0–0.2)

## 2022-07-27 LAB — COMPREHENSIVE METABOLIC PANEL
ALT: 18 U/L (ref 0–44)
AST: 20 U/L (ref 15–41)
Albumin: 4.1 g/dL (ref 3.5–5.0)
Alkaline Phosphatase: 90 U/L (ref 38–126)
Anion gap: 7 (ref 5–15)
BUN: 16 mg/dL (ref 8–23)
CO2: 27 mmol/L (ref 22–32)
Calcium: 9.5 mg/dL (ref 8.9–10.3)
Chloride: 106 mmol/L (ref 98–111)
Creatinine, Ser: 0.87 mg/dL (ref 0.44–1.00)
GFR, Estimated: >60 mL/min (ref >60)
Glucose, Bld: 100 mg/dL — ABNORMAL HIGH (ref 70–99)
Potassium: 3.8 mmol/L (ref 3.5–5.1)
Sodium: 140 mmol/L (ref 135–145)
Total Bilirubin: 0.6 mg/dL (ref 0.3–1.2)
Total Protein: 8 g/dL (ref 6.5–8.1)

## 2022-07-27 LAB — TROPONIN I (HIGH SENSITIVITY)
Troponin I (High Sensitivity): 8 ng/L (ref <18)
Troponin I (High Sensitivity): 8 ng/L (ref <18)

## 2022-07-27 LAB — URINALYSIS, ROUTINE W REFLEX MICROSCOPIC
Bilirubin Urine: NEGATIVE
Glucose, UA: NEGATIVE mg/dL
Hgb urine dipstick: NEGATIVE
Ketones, ur: 5 mg/dL — AB
Leukocytes,Ua: NEGATIVE
Nitrite: NEGATIVE
Protein, ur: 30 mg/dL — AB
Specific Gravity, Urine: 1.028 (ref 1.005–1.030)
pH: 5 (ref 5.0–8.0)

## 2022-07-27 LAB — TYPE AND SCREEN
ABO/RH(D): O POS
Antibody Screen: NEGATIVE

## 2022-07-27 NOTE — ED Triage Notes (Signed)
Patient reports bright red rectal bleeding x 2 days. States it fills toilet.  Endorses being tired. Chest pain yesterday. States same happened two years ago  Abd pain in triage rated 7/10 in lower abdominal area

## 2022-07-27 NOTE — ED Provider Triage Note (Signed)
Emergency Medicine Provider Triage Evaluation Note  Jennifer Davila , a 66 y.o. female  was evaluated in triage.  Pt complains of bright red blood with bowel movements.  Patient states that for the past 2 to 3 days she has noticed a toilet full of blood.  She states she has been trying not to eat so that she would not go to the bathroom as much.  She states on the first day she had 2-3 bloody bowel movements.  Blood is described as bright red and liquid.  She states that she has had a transfusion in the past due to blood in her stool.  She states that at that time gastroenterology was unable to find the source of the bleed.  The patient also endorses a feeling of tiredness and some mild chest pain that began with the most recent bowel movement  Review of Systems  Positive: As above Negative: As above  Physical Exam  BP (!) 195/100   Pulse 85   Temp 98.3 F (36.8 C) (Oral)   Ht '5\' 2"'$  (1.575 m)   Wt 112 kg   SpO2 100%   BMI 45.18 kg/m  Gen:   Awake, no distress   Resp:  Normal effort  MSK:   Moves extremities without difficulty  Other:    Medical Decision Making  Medically screening exam initiated at 6:24 PM.  Appropriate orders placed.  CHELBY SALATA was informed that the remainder of the evaluation will be completed by another provider, this initial triage assessment does not replace that evaluation, and the importance of remaining in the ED until their evaluation is complete.     Dorothyann Peng, PA-C 07/27/22 1825

## 2022-07-28 LAB — POC OCCULT BLOOD, ED: Fecal Occult Bld: POSITIVE — AB

## 2022-07-28 MED ORDER — SODIUM CHLORIDE 0.9 % IV BOLUS (SEPSIS)
1000.0000 mL | Freq: Once | INTRAVENOUS | Status: AC
  Administered 2022-07-28: 1000 mL via INTRAVENOUS

## 2022-07-28 NOTE — ED Provider Notes (Signed)
[facility] DEPT Provider Note   CSN: [id number] Arrival date & time: 07/27/22  1747     History  Chief Complaint  Patient presents with   Rectal Bleeding    Jennifer Davila is a 66 y.o. female.  The history is provided by the patient.  Patient presents for concern for rectal bleeding She reports over the past 2 days she has had multiple episodes of bright red blood per rectum.  She reports it feels the toilet.  She reports feeling tired and generalized weakness.  She had chest pain the previous day, none at this time.  She is abdominal discomfort as well She is not on anticoagulation  Reports previous similar history that required admission and transfusion Past Medical History:  Diagnosis Date   Arthritis    Constipation    on stool softener   GERD (gastroesophageal reflux disease)    Hyperlipidemia    Hypertension    Trouble swallowing 2013   following lap band   Urticaria     Home Medications Prior to Admission medications   Medication Sig Start Date End Date Taking? Authorizing Provider  acetaminophen (TYLENOL) 500 MG tablet Take 1-2 tablets (500-1,000 mg total) by mouth daily as needed for moderate pain (pain). 03/22/21   Newt Minion, MD  amitriptyline (ELAVIL) 25 MG tablet TAKE 1 TABLET BY MOUTH EVERYDAY AT BEDTIME 06/25/22   Durel Salts C, DO  amLODipine (NORVASC) 5 MG tablet Take 0.5 tablets (2.5 mg total) by mouth daily. 07/04/22   Martinique, [name], MD  calcium carbonate (OSCAL) 1500 (600 Ca) MG TABS tablet Take 1 tablet by mouth 2 (two) times daily with a meal. Patient not taking: Reported on 07/03/2022    [provider]  losartan (COZAAR) 100 MG tablet TAKE 1 TABLET BY MOUTH EVERY DAY Patient taking differently: Take 100 mg by mouth daily. 01/08/22   Martinique, [name], MD  omeprazole (PRILOSEC) 40 MG capsule Take 1 capsule (40 mg total) by mouth daily. 02/12/22   Martinique, [name], MD  polyethylene glycol (MIRALAX /  GLYCOLAX) 17 g packet Take 17 g by mouth every other day.    [provider]  potassium chloride (KLOR-CON) 10 MEQ tablet TAKE 1 TABLET BY MOUTH EVERY DAY Patient taking differently: Take 10 mEq by mouth daily. 01/08/22   Martinique, [name], MD  pravastatin (PRAVACHOL) 20 MG tablet TAKE 1 TABLET BY MOUTH EVERY DAY 09/26/21   Martinique, [name], MD  spironolactone (ALDACTONE) 25 MG tablet TAKE 1 TABLET (25 MG TOTAL) BY MOUTH DAILY. 04/03/22   Martinique, [name], MD  tamsulosin (FLOMAX) 0.4 MG CAPS capsule Take 1 capsule (0.4 mg total) by mouth daily. 03/07/22   Palumbo, April, MD  traMADol (ULTRAM) 50 MG tablet Take 1 tablet (50 mg total) by mouth every 6 (six) hours as needed. 03/07/22   Palumbo, April, MD      Allergies    Patient has no known allergies.    Review of Systems   Review of Systems  Constitutional:  Positive for fatigue.  Gastrointestinal:  Positive for blood in stool. Negative for vomiting.    Physical Exam Updated Vital Signs BP (!) 186/102   Pulse 91   Temp 98.7 F (37.1 C) (Oral)   Resp 18   Ht 1.575 m ('5\' 2"'$ )   Wt 112 kg   SpO2 99%   BMI 45.18 kg/m  Physical Exam CONSTITUTIONAL: Well developed/well nourished HEAD: Normocephalic/atraumatic EYES: EOMI/PERRL, conjunctiva pink ENMT:  Mucous membranes moist NECK: supple no meningeal signs SPINE/BACK:entire spine nontender CV: S1/S2 noted, no murmurs/rubs/gallops noted LUNGS: Lungs are clear to auscultation bilaterally, no apparent distress ABDOMEN: soft, nontender, no rebound or guarding, bowel sounds noted throughout abdomen Rectal-no blood or melena.  Nurse Claiborne Billings present for exam GU:no cva tenderness NEURO: Pt is awake/alert/appropriate, moves all extremitiesx4.  No facial droop.   EXTREMITIES: pulses normal/equal, full ROM SKIN: warm, color normal PSYCH: no abnormalities of mood noted, alert and oriented to situation  ED Results / Procedures / Treatments   Labs (all labs ordered are listed, but only abnormal  results are displayed) Labs Reviewed  CBC WITH DIFFERENTIAL/PLATELET - Abnormal; Notable for the following components:      Result Value   RBC 5.22 (*)    MCV 75.9 (*)    MCH 23.2 (*)    All other components within normal limits  COMPREHENSIVE METABOLIC PANEL - Abnormal; Notable for the following components:   Glucose, Bld 100 (*)    All other components within normal limits  URINALYSIS, ROUTINE W REFLEX MICROSCOPIC - Abnormal; Notable for the following components:   APPearance HAZY (*)    Ketones, ur 5 (*)    Protein, ur 30 (*)    Bacteria, UA RARE (*)    All other components within normal limits  POC OCCULT BLOOD, ED - Abnormal; Notable for the following components:   Fecal Occult Bld POSITIVE (*)    All other components within normal limits  TYPE AND SCREEN  TROPONIN I (HIGH SENSITIVITY)  TROPONIN I (HIGH SENSITIVITY)    EKG EKG Interpretation  Date/Time:  Friday July 27 2022 18:39:23 EST Ventricular Rate:  87 PR Interval:  157 QRS Duration: 81 QT Interval:  391 QTC Calculation: 471 R Axis:   -19 Text Interpretation: Sinus rhythm Low voltage, precordial leads Left ventricular hypertrophy Confirmed by Ripley Fraise [phone]) on 07/27/2022 11:54:46 PM  Radiology No results found.  Procedures Procedures    Medications Ordered in ED Medications  sodium chloride 0.9 % bolus 1,000 mL (0 mLs Intravenous Stopped 07/28/22 0153)    ED Course/ Medical Decision Making/ A&P Clinical Course as of 07/28/22 0345  Sat Jul 28, 2022  0034 Ketones, ur(!): 5 Mild dehydration [DW]  0038 Patient previously found to have diverticulosis without active bleeding.  Patient is very well-appearing at this time.  Stool color was [place], but was positive Hemoccult.  Likely from previous episode of bleeding.  However no active bleeding at this time.  Will give IV fluids and reassess [DW]    Clinical Course User Index [DW] Ripley Fraise, MD                           Medical  Decision Making Amount and/or Complexity of Data Reviewed Labs:  Decision-making details documented in ED Course.   This patient presents to the ED for concern of rectal bleeding, this involves an extensive number of treatment options, and is a complaint that carries with it a high risk of complications and morbidity.  The differential diagnosis includes but is not limited to diverticulosis, variceal bleed, peptic ulcer disease, anal fissure, hemorrhoid  Comorbidities that complicate the patient evaluation: Patient's presentation is complicated by their history of obesity and diverticulosis  Additional history obtained: Records reviewed previous admission documents  Lab Tests: I Ordered, and personally interpreted labs.  The pertinent results include: Dehydration  Medicines ordered and prescription drug management: I ordered medication including IV  fluids for dehydration Reevaluation of the patient after these medicines showed that the patient    improved    Reevaluation: After the interventions noted above, I reevaluated the patient and found that they have :improved  Complexity of problems addressed: Patient's presentation is most consistent with  acute presentation with potential threat to life or bodily function  Disposition: After consideration of the diagnostic results and the patient's response to treatment,  I feel that the patent would benefit from discharge   .    Patient well-appearing and stable in the ER.  No significant abdominal tenderness on exam.  No new blood loss in the ER.  She is not on anticoagulation.  No signs of acute GI bleed at this time.  Patient would like to be discharged home.  Discussed need for follow-up as an outpatient with gastroenterology.  Discussed need to avoid aspirin and ibuprofen.  We discussed strict ER return precautions       Final Clinical Impression(s) / ED Diagnoses Final diagnoses:  Rectal bleeding  Diverticulosis    Rx /  DC Orders ED Discharge Orders     None         Ripley Fraise, MD 07/28/22 [phone]

## 2022-07-28 NOTE — ED Notes (Signed)
Pt ambulated to restroom, steady gait  

## 2022-07-29 DIAGNOSIS — I10 Essential (primary) hypertension: Secondary | ICD-10-CM | POA: Diagnosis not present

## 2022-07-29 DIAGNOSIS — E785 Hyperlipidemia, unspecified: Secondary | ICD-10-CM

## 2022-07-31 ENCOUNTER — Telehealth: Payer: Self-pay

## 2022-07-31 NOTE — Telephone Encounter (Signed)
Left patient a detailed vm asking that she call the office back at her convenience to schedule hospital follow up appt.

## 2022-07-31 NOTE — Telephone Encounter (Signed)
Pt scheduled for appt on Wednesday, 09/12/22 at 1:40 pm.

## 2022-07-31 NOTE — Telephone Encounter (Signed)
Havery Moros Carlota Raspberry, MD  Yevette Edwards, RN [place] can you help coordinate ED follow up? Thanks

## 2022-07-31 NOTE — Telephone Encounter (Signed)
Patient returned your call, asking that you please call her back.

## 2022-07-31 NOTE — Telephone Encounter (Signed)
Per previous note - pt just needs a hospital follow up appt (rectal bleeding) - she will be considered a new patient since she has not been seen since 2019. Please contact patient to schedule. Thanks

## 2022-08-13 NOTE — Progress Notes (Deleted)
HPI:  Jennifer Davila is a 67 y.o. female, who is here today to follow on recent visit.  Review of Systems See other pertinent positives and negatives in HPI.  Current Outpatient Medications on File Prior to Visit  Medication Sig Dispense Refill   acetaminophen (TYLENOL) 500 MG tablet Take 1-2 tablets (500-1,000 mg total) by mouth daily as needed for moderate pain (pain). 60 tablet 1   amitriptyline (ELAVIL) 25 MG tablet TAKE 1 TABLET BY MOUTH EVERYDAY AT BEDTIME 90 tablet 0   amLODipine (NORVASC) 5 MG tablet Take 0.5 tablets (2.5 mg total) by mouth daily. 90 tablet 1   calcium carbonate (OSCAL) 1500 (600 Ca) MG TABS tablet Take 1 tablet by mouth 2 (two) times daily with a meal. (Patient not taking: Reported on 07/03/2022)     losartan (COZAAR) 100 MG tablet TAKE 1 TABLET BY MOUTH EVERY DAY (Patient taking differently: Take 100 mg by mouth daily.) 90 tablet 2   omeprazole (PRILOSEC) 40 MG capsule Take 1 capsule (40 mg total) by mouth daily. 30 capsule 2   polyethylene glycol (MIRALAX / GLYCOLAX) 17 g packet Take 17 g by mouth every other day.     potassium chloride (KLOR-CON) 10 MEQ tablet TAKE 1 TABLET BY MOUTH EVERY DAY (Patient taking differently: Take 10 mEq by mouth daily.) 90 tablet 2   pravastatin (PRAVACHOL) 20 MG tablet TAKE 1 TABLET BY MOUTH EVERY DAY 90 tablet 3   spironolactone (ALDACTONE) 25 MG tablet TAKE 1 TABLET (25 MG TOTAL) BY MOUTH DAILY. 90 tablet 1   tamsulosin (FLOMAX) 0.4 MG CAPS capsule Take 1 capsule (0.4 mg total) by mouth daily. 30 capsule 0   traMADol (ULTRAM) 50 MG tablet Take 1 tablet (50 mg total) by mouth every 6 (six) hours as needed. 11 tablet 0   No current facility-administered medications on file prior to visit.    Past Medical History:  Diagnosis Date   Arthritis    Constipation    on stool softener   GERD (gastroesophageal reflux disease)    Hyperlipidemia    Hypertension    Trouble swallowing 2013   following lap band   Urticaria     No Known Allergies  Social History   Socioeconomic History   Marital status: Married    Spouse name: Not on file   Number of children: 1   Years of education: Not on file   Highest education level: Not on file  Occupational History   Not on file  Tobacco Use   Smoking status: Former    Types: Cigarettes    Quit date: 10/02/1972    Years since quitting: 49.8   Smokeless tobacco: Never  Vaping Use   Vaping Use: Never used  Substance and Sexual Activity   Alcohol use: No   Drug use: No   Sexual activity: Never  Other Topics Concern   Not on file  Social History Narrative   Not on file   Social Determinants of Health   Financial Resource Strain: Low Risk  (02/19/2022)   Overall Financial Resource Strain (CARDIA)    Difficulty of Paying Living Expenses: Not hard at all  Food Insecurity: No Food Insecurity (02/19/2022)   Hunger Vital Sign    Worried About Running Out of Food in the Last Year: Never true    Ran Out of Food in the Last Year: Never true  Transportation Needs: No Transportation Needs (02/19/2022)   PRAPARE - Hydrologist (Medical):  No    Lack of Transportation (Non-Medical): No  Physical Activity: Inactive (02/19/2022)   Exercise Vital Sign    Days of Exercise per Week: 0 days    Minutes of Exercise per Session: 0 min  Stress: No Stress Concern Present (02/19/2022)   [place]    Feeling of Stress : Not at all  Social Connections: [place] (02/19/2022)   Social Connection and Isolation Panel [NHANES]    Frequency of Communication with Friends and Family: More than three times a week    Frequency of Social Gatherings with Friends and Family: More than three times a week    Attends Religious Services: More than 4 times per year    Active Member of [employer] or Organizations: Yes    Attends Music therapist: More than 4 times per year    Marital  Status: Married    There were no vitals filed for this visit. There is no height or weight on file to calculate BMI.  Physical Exam  ASSESSMENT AND PLAN:  There are no diagnoses linked to this encounter.  No orders of the defined types were placed in this encounter.   No problem-specific Assessment & Plan notes found for this encounter.   No follow-ups on file.  [name]. Martinique, MD  [facility]. [place] office.

## 2022-08-14 ENCOUNTER — Ambulatory Visit: Payer: No Typology Code available for payment source | Admitting: Family Medicine

## 2022-08-21 NOTE — Progress Notes (Unsigned)
HPI:  [name] is a 67 y.o. female, who is here today to follow on recent visit. She was last seen on 03/27/2022, since her last visit she has been evaluated in the ED for rectal bleeding, which has resolved. She has an appointment with gastroenterologist. She is no longer on ibuprofen to treat lower extremity pain. Lab Results  Component Value Date   WBC 8.3 07/27/2022   HGB 12.1 07/27/2022   HCT 39.6 07/27/2022   MCV 75.9 (L) 07/27/2022   PLT 284 07/27/2022   She has also seen Dr.Engler, Phys med, for bilateral lower extremity pain, thought to be caused by peripheral neuropathy. Gabapentin has not helped in the past. Currently she is on amitriptyline 25 mg daily at bedtime.  Hypertension: Currently she is on losartan 100 mg daily, spironolactone 25 mg daily, and amlodipine 5 mg daily. She does not monitor BP at home. Negative for unusual or severe headache, visual changes, exertional chest pain, dyspnea,  focal weakness, or worsening edema. Hypokalemia on K-Lor 10 mK daily. Lab Results  Component Value Date   CREATININE 0.87 07/27/2022   BUN 16 07/27/2022   NA 140 07/27/2022   K 3.8 07/27/2022   CL 106 07/27/2022   CO2 27 07/27/2022   She is not exercising regularly. Her husband was recently diagnosed with DM 2, so she is planning on making some dietary changes.  Today she is also complaining of intermittent pruritus under breast and inguinal area, she has not noted any rash. She has noted that it is exacerbated by heat and sweat. Placing a towel on affected areas has helped.  Review of Systems  Constitutional:  Negative for chills and fever.  Respiratory:  Negative for cough and wheezing.   Gastrointestinal:  Negative for abdominal pain, nausea and vomiting.  Genitourinary:  Negative for decreased urine volume and hematuria.  Skin:  Negative for rash.  Neurological:  Negative for syncope and facial asymmetry.  See other pertinent positives and negatives  in HPI.  Current Outpatient Medications on File Prior to Visit  Medication Sig Dispense Refill   acetaminophen (TYLENOL) 500 MG tablet Take 1-2 tablets (500-1,000 mg total) by mouth daily as needed for moderate pain (pain). 60 tablet 1   amitriptyline (ELAVIL) 25 MG tablet TAKE 1 TABLET BY MOUTH EVERYDAY AT BEDTIME 90 tablet 0   amLODipine (NORVASC) 5 MG tablet Take 0.5 tablets (2.5 mg total) by mouth daily. 90 tablet 1   calcium carbonate (OSCAL) 1500 (600 Ca) MG TABS tablet Take 1 tablet by mouth 2 (two) times daily with a meal.     losartan (COZAAR) 100 MG tablet TAKE 1 TABLET BY MOUTH EVERY DAY (Patient taking differently: Take 100 mg by mouth daily.) 90 tablet 2   omeprazole (PRILOSEC) 40 MG capsule Take 1 capsule (40 mg total) by mouth daily. 30 capsule 2   polyethylene glycol (MIRALAX / GLYCOLAX) 17 g packet Take 17 g by mouth every other day.     potassium chloride (KLOR-CON) 10 MEQ tablet TAKE 1 TABLET BY MOUTH EVERY DAY (Patient taking differently: Take 10 mEq by mouth daily.) 90 tablet 2   pravastatin (PRAVACHOL) 20 MG tablet TAKE 1 TABLET BY MOUTH EVERY DAY 90 tablet 3   spironolactone (ALDACTONE) 25 MG tablet TAKE 1 TABLET (25 MG TOTAL) BY MOUTH DAILY. 90 tablet 1   tamsulosin (FLOMAX) 0.4 MG CAPS capsule Take 1 capsule (0.4 mg total) by mouth daily. 30 capsule 0   traMADol (ULTRAM) 50 MG  tablet Take 1 tablet (50 mg total) by mouth every 6 (six) hours as needed. 11 tablet 0   No current facility-administered medications on file prior to visit.   Past Medical History:  Diagnosis Date   Arthritis    Constipation    on stool softener   GERD (gastroesophageal reflux disease)    Hyperlipidemia    Hypertension    Trouble swallowing 2013   following lap band   Urticaria    No Known Allergies  Social History   Socioeconomic History   Marital status: Married    Spouse name: Not on file   Number of children: 1   Years of education: Not on file   Highest education level: Not  on file  Occupational History   Not on file  Tobacco Use   Smoking status: Former    Types: Cigarettes    Quit date: 10/02/1972    Years since quitting: 49.9   Smokeless tobacco: Never  Vaping Use   Vaping Use: Never used  Substance and Sexual Activity   Alcohol use: No   Drug use: No   Sexual activity: Never  Other Topics Concern   Not on file  Social History Narrative   Not on file   Social Determinants of Health   Financial Resource Strain: Low Risk  (02/19/2022)   Overall Financial Resource Strain (CARDIA)    Difficulty of Paying Living Expenses: Not hard at all  Food Insecurity: No Food Insecurity (02/19/2022)   Hunger Vital Sign    Worried About Running Out of Food in the Last Year: Never true    [place] in the Last Year: Never true  Transportation Needs: No Transportation Needs (02/19/2022)   PRAPARE - Hydrologist (Medical): No    Lack of Transportation (Non-Medical): No  Physical Activity: Inactive (02/19/2022)   Exercise Vital Sign    Days of Exercise per Week: 0 days    Minutes of Exercise per Session: 0 min  Stress: No Stress Concern Present (02/19/2022)   [place] of Stress : Not at all  Social Connections: [place] (02/19/2022)   Social Connection and Isolation Panel [NHANES]    Frequency of Communication with Friends and Family: More than three times a week    Frequency of Social Gatherings with Friends and Family: More than three times a week    Attends Religious Services: More than 4 times per year    Active Member of [employer] or Organizations: Yes    Attends Archivist Meetings: More than 4 times per year    Marital Status: Married   Vitals:   08/22/22 1351  BP: 138/88  Pulse: 94  Resp: 16  Temp: 98.7 F (37.1 C)  SpO2: 98%   Wt Readings from Last 3 Encounters:  08/22/22 246 lb 4 oz (111.7 kg)  07/27/22 247 lb (112  kg)  05/30/22 248 lb (112.5 kg)  Body mass index is 45.04 kg/m.  Physical Exam Vitals and nursing note reviewed.  Constitutional:      General: She is not in acute distress.    Appearance: She is well-developed.  HENT:     Head: Normocephalic and atraumatic.     Mouth/Throat:     Mouth: Mucous membranes are moist.     Pharynx: Oropharynx is clear.  Eyes:     Conjunctiva/sclera: Conjunctivae normal.  Cardiovascular:     Rate  and Rhythm: Normal rate and regular rhythm.     Pulses:          Dorsalis pedis pulses are 2+ on the right side and 2+ on the left side.     Heart sounds: No murmur heard. Pulmonary:     Effort: Pulmonary effort is normal. No respiratory distress.     Breath sounds: Normal breath sounds.  Abdominal:     Palpations: Abdomen is soft. There is no hepatomegaly or mass.     Tenderness: There is no abdominal tenderness.  Lymphadenopathy:     Cervical: No cervical adenopathy.  Skin:    General: Skin is warm.     Findings: No erythema or rash.  Neurological:     General: No focal deficit present.     Mental Status: She is alert and oriented to person, place, and time.     Cranial Nerves: No cranial nerve deficit.     Gait: Gait normal.  Psychiatric:        Mood and Affect: Mood and affect normal.   ASSESSMENT AND PLAN:  [name] was seen today for medical management of chronic issues.  Diagnoses and all orders for this visit: Orders Placed This Encounter  Procedures   Flu Vaccine QUAD High Dose(Fluad)   Hypertension, essential, benign Assessment & Plan: BP adequately controlled. Continue amlodipine 5 mg daily, losartan 100 mg daily, and spironolactone 25 mg daily. Monitor BP at home. Continue low-salt/DASH diet.   Need for influenza vaccination -     Flu Vaccine QUAD High Dose(Fluad)  Lower GI bleed Assessment & Plan: Problem has been recorded. Colonoscopy done in 12/2017 showed internal hemorrhoids. She understands she is to avoid  NSAIDs. She has an appointment with GI next month. Instructed about warning signs.   Skin pruritus Assessment & Plan: There is no associated rash, it could be intertrigo. Recommend keeping affected areas dry, she can try OTC Lotrimin powder.  Also recommend avoid Lycra, instead wear cotton underwear and bra. Monitor for new symptoms.   Morbid obesity ([place]) Assessment & Plan: Stable otherwise. She understands the benefits of wt loss as well as adverse effects of obesity. Consistency with healthy diet and physical activity encouraged.    Hypokalemia Assessment & Plan: Last potassium 3.8 in 06/2022. Continue K-Lor 10 mK daily.   Return in about 6 months (around 02/20/2023).  Jennifer Carriero G. Martinique, MD  [facility]. [place] office.

## 2022-08-22 ENCOUNTER — Ambulatory Visit (INDEPENDENT_AMBULATORY_CARE_PROVIDER_SITE_OTHER): Payer: No Typology Code available for payment source | Admitting: Family Medicine

## 2022-08-22 ENCOUNTER — Encounter: Payer: Self-pay | Admitting: Family Medicine

## 2022-08-22 VITALS — BP 138/88 | HR 94 | Temp 98.7°F | Resp 16 | Ht 62.0 in | Wt 246.2 lb

## 2022-08-22 DIAGNOSIS — Z23 Encounter for immunization: Secondary | ICD-10-CM

## 2022-08-22 DIAGNOSIS — I10 Essential (primary) hypertension: Secondary | ICD-10-CM | POA: Diagnosis not present

## 2022-08-22 DIAGNOSIS — E876 Hypokalemia: Secondary | ICD-10-CM | POA: Diagnosis not present

## 2022-08-22 DIAGNOSIS — K922 Gastrointestinal hemorrhage, unspecified: Secondary | ICD-10-CM | POA: Diagnosis not present

## 2022-08-22 DIAGNOSIS — L299 Pruritus, unspecified: Secondary | ICD-10-CM | POA: Diagnosis not present

## 2022-08-22 NOTE — Assessment & Plan Note (Signed)
BP adequately controlled. Continue amlodipine 5 mg daily, losartan 100 mg daily, and spironolactone 25 mg daily. Monitor BP at home. Continue low-salt/DASH diet.

## 2022-08-22 NOTE — Assessment & Plan Note (Signed)
There is no associated rash, it could be intertrigo. Recommend keeping affected areas dry, she can try OTC Lotrimin powder.  Also recommend avoid Lycra, instead wear cotton underwear and bra. Monitor for new symptoms.

## 2022-08-22 NOTE — Patient Instructions (Addendum)
A few things to remember from today's visit:  Hypertension, essential, benign  Hyperlipidemia, unspecified hyperlipidemia type  Need for influenza vaccination - Plan: Flu Vaccine QUAD High Dose(Fluad)  Lower GI bleed No changes today.   If you need refills for medications you take chronically, please call your pharmacy. Do not use My Chart to request refills or for acute issues that need immediate attention. If you send a my chart message, it may take a few days to be addressed, specially if I am not in the office.  Please be sure medication list is accurate. If a new problem present, please set up appointment sooner than planned today.

## 2022-08-22 NOTE — Assessment & Plan Note (Signed)
Stable otherwise. She understands the benefits of wt loss as well as adverse effects of obesity. Consistency with healthy diet and physical activity encouraged.

## 2022-08-22 NOTE — Assessment & Plan Note (Signed)
Problem has been recorded. Colonoscopy done in 12/2017 showed internal hemorrhoids. She understands she is to avoid NSAIDs. She has an appointment with GI next month. Instructed about warning signs.

## 2022-08-22 NOTE — Assessment & Plan Note (Signed)
Last potassium 3.8 in 06/2022. Continue K-Lor 10 mK daily.

## 2022-09-04 NOTE — Progress Notes (Signed)
Subjective:    Patient ID: Jennifer Davila, female    DOB: May 14, 1956, 67 y.o.   MRN: [id number]  HPI SHANIQUEA HNAT is a 67 y.o. year old female  who  has a past medical history of Arthritis, Constipation, GERD (gastroesophageal reflux disease), Hyperlipidemia, Hypertension, Trouble swallowing (2013), and Urticaria.   They are presenting to PM&R clinic for follow up of bilateral leg pain, etiology likely peripheral neuropathy c/b extensive LE edema.    Bilateral leg pain Assessment & Plan: - Mixed etiology with peripheral neuropathy c/b overlying peripheral edema, with possible arthritic pain   - EMG results indicate peripheral polyneuropathy, without signs of lumbosacral radiculopathy, although NCS study was difficult d/t overlying edema.      - Follow up with me in clinic in 3 months. Call before then if any concerns, and you can see one of my colleagues sooner.      Bilateral lower extremity edema Assessment & Plan: Advised patient to resume spironolactone per PCP, as Hx hypokalemia makes lasix poor choice for edema management.   Continue use of compression stockings during daytime. Elevate legs with pillows in bed to raise above heart.    Pending insurance coverage for duplexes per PCP  Gait difficulty Assessment & Plan: Continue PT with goal development of HEP for gait stability   Interval Hx: - Labs reviewed, started on Elavil 25 mg QHS #30 tabs R2. She states she has a prayer line every other week that she does not take her medications because it makes her too groggy in the morning. She states it helps a lot but she still needs tylenol during the daytime.  - Went 4-5x PT, has HEP which has not been helping with her pain, but does help with weight loss. She has to concentrate on walking straight; she tried to wear kitsune heels and felt pretty unsteady. She does walk better in flats.  - Has occasional cramps in her calves; uses ice for those.  - Having pain on the top  of her left foot starting one month. No inciting event.  - She has lost 5 lbs recently. States she has needed to change her diet with her husband because he had a few mini-strokes. She does not have a dietician. She would be interested in it.  - Nighttime cramps are bilateral, mostly in calf and sometimes in the thighs. It will keep her awake. Standing on a cold floor helps. Mustard does not help. She has not tried supplements.  - Motrin made her stomach bleed  Pain Inventory Average Pain 9 Pain Right Now 0 My pain is intermittent, sharp, stabbing, and aching  In the last 24 hours, has pain interfered with the following? General activity 0 Relation with others 0 Enjoyment of life 0 What TIME of day is your pain at its worst? evening and night Sleep (in general) Good  Pain is worse with: sitting and inactivity Pain improves with: medication and ice Relief from Meds:  functional level  Family History  Problem Relation Age of Onset   Hypertension Mother    Stroke Mother    Diabetes Father    Alcohol abuse Father    Kidney disease Father    Colon polyps Brother    Cancer Brother    Alcohol abuse Brother    Social History   Socioeconomic History   Marital status: Married    Spouse name: Not on file   Number of children: 1   Years of education: Not on  file   Highest education level: Not on file  Occupational History   Not on file  Tobacco Use   Smoking status: Former    Types: Cigarettes    Quit date: 10/02/1972    Years since quitting: 49.9   Smokeless tobacco: Never  Vaping Use   Vaping Use: Never used  Substance and Sexual Activity   Alcohol use: No   Drug use: No   Sexual activity: Never  Other Topics Concern   Not on file  Social History Narrative   Not on file   Social Determinants of Health   Financial Resource Strain: Low Risk  (02/19/2022)   Overall Financial Resource Strain (CARDIA)    Difficulty of Paying Living Expenses: Not hard at all  Food  Insecurity: No Food Insecurity (02/19/2022)   Hunger Vital Sign    Worried About Running Out of Food in the Last Year: Never true    Ran Out of Food in the Last Year: Never true  Transportation Needs: No Transportation Needs (02/19/2022)   PRAPARE - Hydrologist (Medical): No    Lack of Transportation (Non-Medical): No  Physical Activity: Inactive (02/19/2022)   Exercise Vital Sign    Days of Exercise per Week: 0 days    Minutes of Exercise per Session: 0 min  Stress: No Stress Concern Present (02/19/2022)   [place]    Feeling of Stress : Not at all  Social Connections: [place] (02/19/2022)   Social Connection and Isolation Panel [NHANES]    Frequency of Communication with Friends and Family: More than three times a week    Frequency of Social Gatherings with Friends and Family: More than three times a week    Attends Religious Services: More than 4 times per year    Active Member of Clubs or Organizations: Yes    Attends Music therapist: More than 4 times per year    Marital Status: Married   Past Surgical History:  Procedure Laterality Date   ABDOMINAL HYSTERECTOMY     BREAST BIOPSY Left 11/04/2019    BENIGN LYMPH NODE WITH PARACORTICAL HYPERPLASIA   CHOLECYSTECTOMY     COLONOSCOPY N/A 01/08/2018   Procedure: COLONOSCOPY;  Surgeon: Yetta Flock, MD;  Location: [facility];  Service: Gastroenterology;  Laterality: N/A;   LAPAROSCOPIC GASTRIC BANDING     LAPAROSCOPIC REPAIR AND REMOVAL OF GASTRIC BAND  2013-2014   Past Surgical History:  Procedure Laterality Date   ABDOMINAL HYSTERECTOMY     BREAST BIOPSY Left 11/04/2019    BENIGN LYMPH NODE WITH PARACORTICAL HYPERPLASIA   CHOLECYSTECTOMY     COLONOSCOPY N/A 01/08/2018   Procedure: COLONOSCOPY;  Surgeon: Yetta Flock, MD;  Location: [facility];  Service: Gastroenterology;  Laterality: N/A;    LAPAROSCOPIC GASTRIC BANDING     LAPAROSCOPIC REPAIR AND REMOVAL OF GASTRIC BAND  2013-2014   Past Medical History:  Diagnosis Date   Arthritis    Constipation    on stool softener   GERD (gastroesophageal reflux disease)    Hyperlipidemia    Hypertension    Trouble swallowing 2013   following lap band   Urticaria    There were no vitals taken for this visit.  Opioid Risk Score:   Fall Risk Score:  `1  Depression screen PHQ 2/9     08/22/2022    2:15 PM 05/16/2022    2:30 PM 04/25/2022   10:54 AM 02/19/2022  2:21 PM 02/12/2022   10:51 AM 12/19/2020    2:07 PM 12/19/2020    2:03 PM  Depression screen PHQ 2/9  Decreased Interest 0 0 0 0 0 0 0  Down, Depressed, Hopeless 0 0 0 0 0 0 0  PHQ - 2 Score 0 0 0 0 0 0 0  Altered sleeping   1      Tired, decreased energy   1      Change in appetite   1      Feeling bad or failure about yourself    0      Trouble concentrating   0      Moving slowly or fidgety/restless   0      Suicidal thoughts   0      PHQ-9 Score   3      Difficult doing work/chores   Not difficult at all        Review of Systems  Constitutional:  Positive for fatigue. Negative for unexpected weight change.  Cardiovascular:  Positive for leg swelling.  Musculoskeletal:  Positive for gait problem. Negative for arthralgias and myalgias.  All other systems reviewed and are negative.      Objective:   Physical Exam  Physical Exam  Constitution: Appropriate appearance for age. No apparnet distress +Obese HEENT: PERRL, EOMI grossly intact.  Resp: CTAB. No rales, rhonchi, or wheezing. Cardio: RRR. No mumurs, rubs, or gallops. Bilaterally LE pitting edema 2+  Abdomen: Nondistended. Nontender.   Psych: Appropriate mood and affect.   Neurologic Exam:   DTRs: Reflexes were 1+ in bilateral lower extremities. Sensory exam: revealed decreased sensation to light touch in the bilateral ankles, feet Motor exam: strength normal in all myotomal regions of bilateral  lower extremities in knee extension, dorsiflexion, plantarflexion, and EHL. Coordination: Fine motor coordination was normal.      Assessment & Plan:   DORRENE LOVVORN is a 67 y.o. year old female  who  has a past medical history of Arthritis, Constipation, GERD (gastroesophageal reflux disease), Hyperlipidemia, Hypertension, Trouble swallowing (2013), and Urticaria.   They are presenting to PM&R clinic as a follow up for bilateral leg pain.   Leg cramping Assessment & Plan: RLS vs. Cramping in bilateral legs, feet at nighttime.   Neuropathy labs as above WNL.   Please take a vitamin B complex three times daily. Ensure this has at least 30 mg of vitamin B6 in the capsule.   Take iron supplement with 325 mg of iron every other day. If it upsets your stomach, you can decrease the dose until it's better tolerated.  I have prescribed tizanidine 2 mg as needed at bedtime. DO NOT take this until you have tried the supplements for at least 2 weeks. Only take it at bedtime due to risk of grogginess.   Follow up in 3 months. 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   Orders: -     Amb ref to Medical Nutrition Therapy-MNT  Other orders -     B Complex Vitamins; Take vitamin B complex three times daily, make sure the complex is  containing at least 30 mg of vitamin B6  Dispense: 80 capsule; Refill: 6 -     Iron; Take one tablet every other day.  Dispense: 15 tablet; Refill: 0 -     tiZANidine HCl; Take at bedtime for muscle spasms  Dispense: 15 tablet; Refill: 0

## 2022-09-05 ENCOUNTER — Encounter: Payer: Self-pay | Admitting: Physical Medicine and Rehabilitation

## 2022-09-05 ENCOUNTER — Encounter
Payer: No Typology Code available for payment source | Attending: Physical Medicine and Rehabilitation | Admitting: Physical Medicine and Rehabilitation

## 2022-09-05 VITALS — BP 146/89 | HR 84 | Ht 62.0 in | Wt 243.0 lb

## 2022-09-05 DIAGNOSIS — R600 Localized edema: Secondary | ICD-10-CM | POA: Insufficient documentation

## 2022-09-05 DIAGNOSIS — G609 Hereditary and idiopathic neuropathy, unspecified: Secondary | ICD-10-CM | POA: Diagnosis not present

## 2022-09-05 DIAGNOSIS — Z789 Other specified health status: Secondary | ICD-10-CM | POA: Insufficient documentation

## 2022-09-05 DIAGNOSIS — R252 Cramp and spasm: Secondary | ICD-10-CM | POA: Insufficient documentation

## 2022-09-05 DIAGNOSIS — G2581 Restless legs syndrome: Secondary | ICD-10-CM | POA: Diagnosis not present

## 2022-09-05 MED ORDER — B COMPLEX VITAMINS PO CAPS: ORAL_CAPSULE | ORAL | 6 refills | Status: DC

## 2022-09-05 MED ORDER — IRON 325 (65 FE) MG PO TABS: ORAL_TABLET | ORAL | 0 refills | Status: DC

## 2022-09-05 MED ORDER — TIZANIDINE HCL 4 MG PO TABS: ORAL_TABLET | ORAL | 0 refills | Status: DC

## 2022-09-05 NOTE — Patient Instructions (Addendum)
Please take a vitamin B complex three times daily. Ensure this has at least 30 mg of vitamin B6 in the capsule.   Take iron supplement with 325 mg of iron every other day. If it upsets your stomach, you can decrease the dose until it's better tolerated.  I have prescribed tizanidine 2 mg as needed at bedtime. DO NOT take this until you have tried the supplements for at least 2 weeks. Only take it at bedtime due to risk of grogginess.   Follow up in 3 months. Call me 2 week after starting medications to report effect

## 2022-09-10 DIAGNOSIS — Z789 Other specified health status: Secondary | ICD-10-CM | POA: Insufficient documentation

## 2022-09-10 NOTE — Assessment & Plan Note (Signed)
Referral to nutrition sent as pt and her husband are trying to improve their diet d/t his recent strokes.

## 2022-09-10 NOTE — Assessment & Plan Note (Signed)
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

## 2022-09-10 NOTE — Assessment & Plan Note (Signed)
Improved with used of compression stockings; continue to use these and diuretics per PCP

## 2022-09-10 NOTE — Assessment & Plan Note (Signed)
RLS vs. Cramping in bilateral legs, feet at nighttime.   Neuropathy labs as above WNL.   Please take a vitamin B complex three times daily. Ensure this has at least 30 mg of vitamin B6 in the capsule.   Take iron supplement with 325 mg of iron every other day. If it upsets your stomach, you can decrease the dose until it's better tolerated.  I have prescribed tizanidine 2 mg as needed at bedtime. DO NOT take this until you have tried the supplements for at least 2 weeks. Only take it at bedtime due to risk of grogginess.   Follow up in 3 months. Call me 2 week after starting medications to report effect

## 2022-09-11 IMAGING — DX DG HAND COMPLETE 3+V*R*
3 series · 3 of 3 positions shown · non-contrast
Comparison: None.

CLINICAL DATA: Right hand pain x 2-4 weeks.

EXAM:
RIGHT HAND - COMPLETE 3+ VIEW

[hand ap]
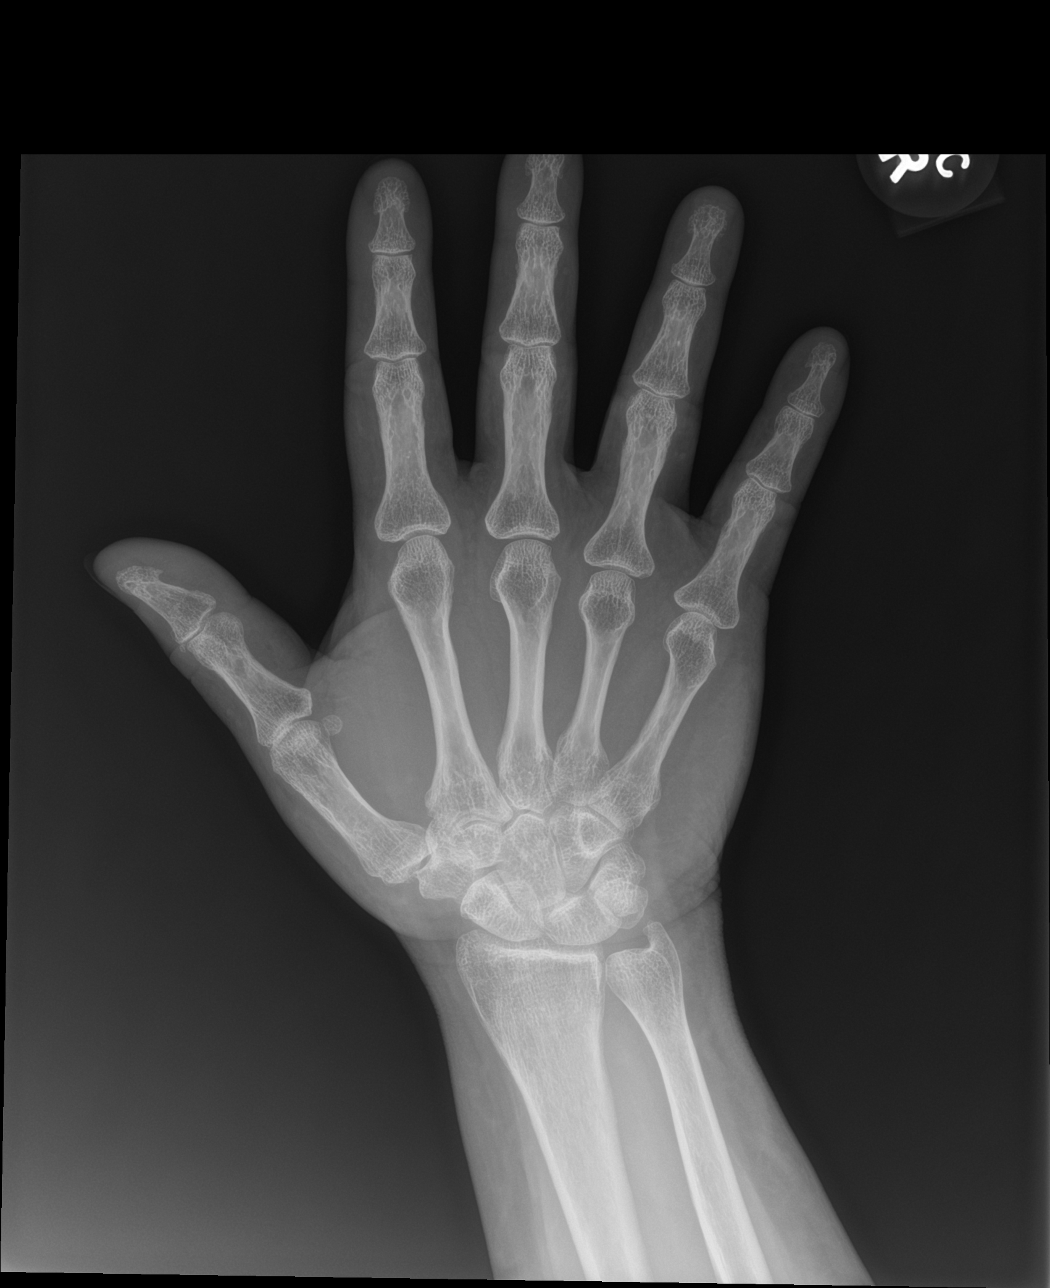

[hand obl]
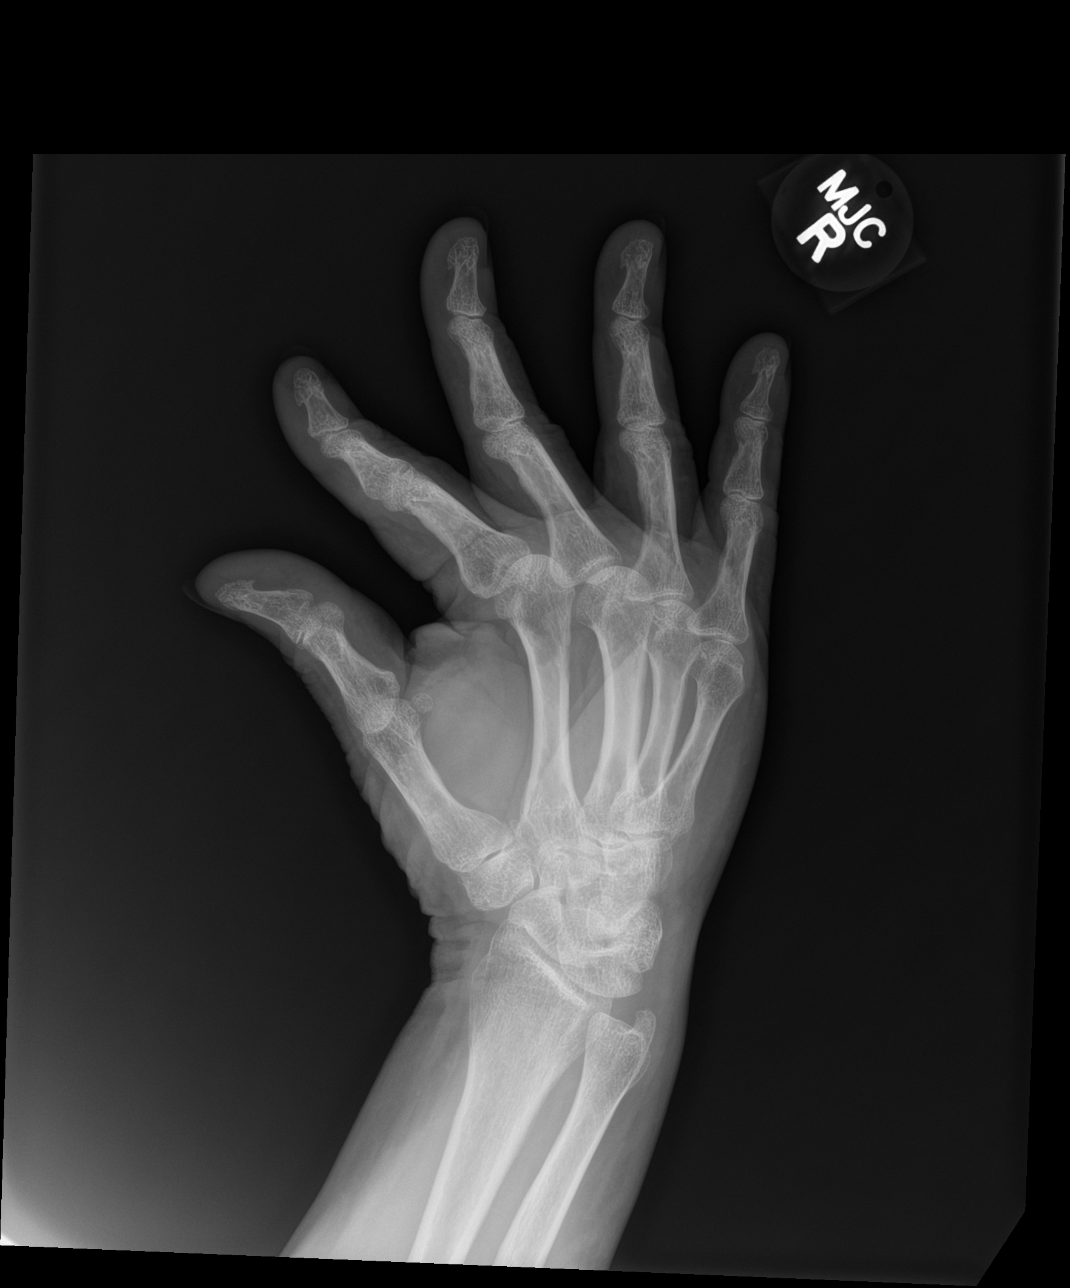

[hand lat]
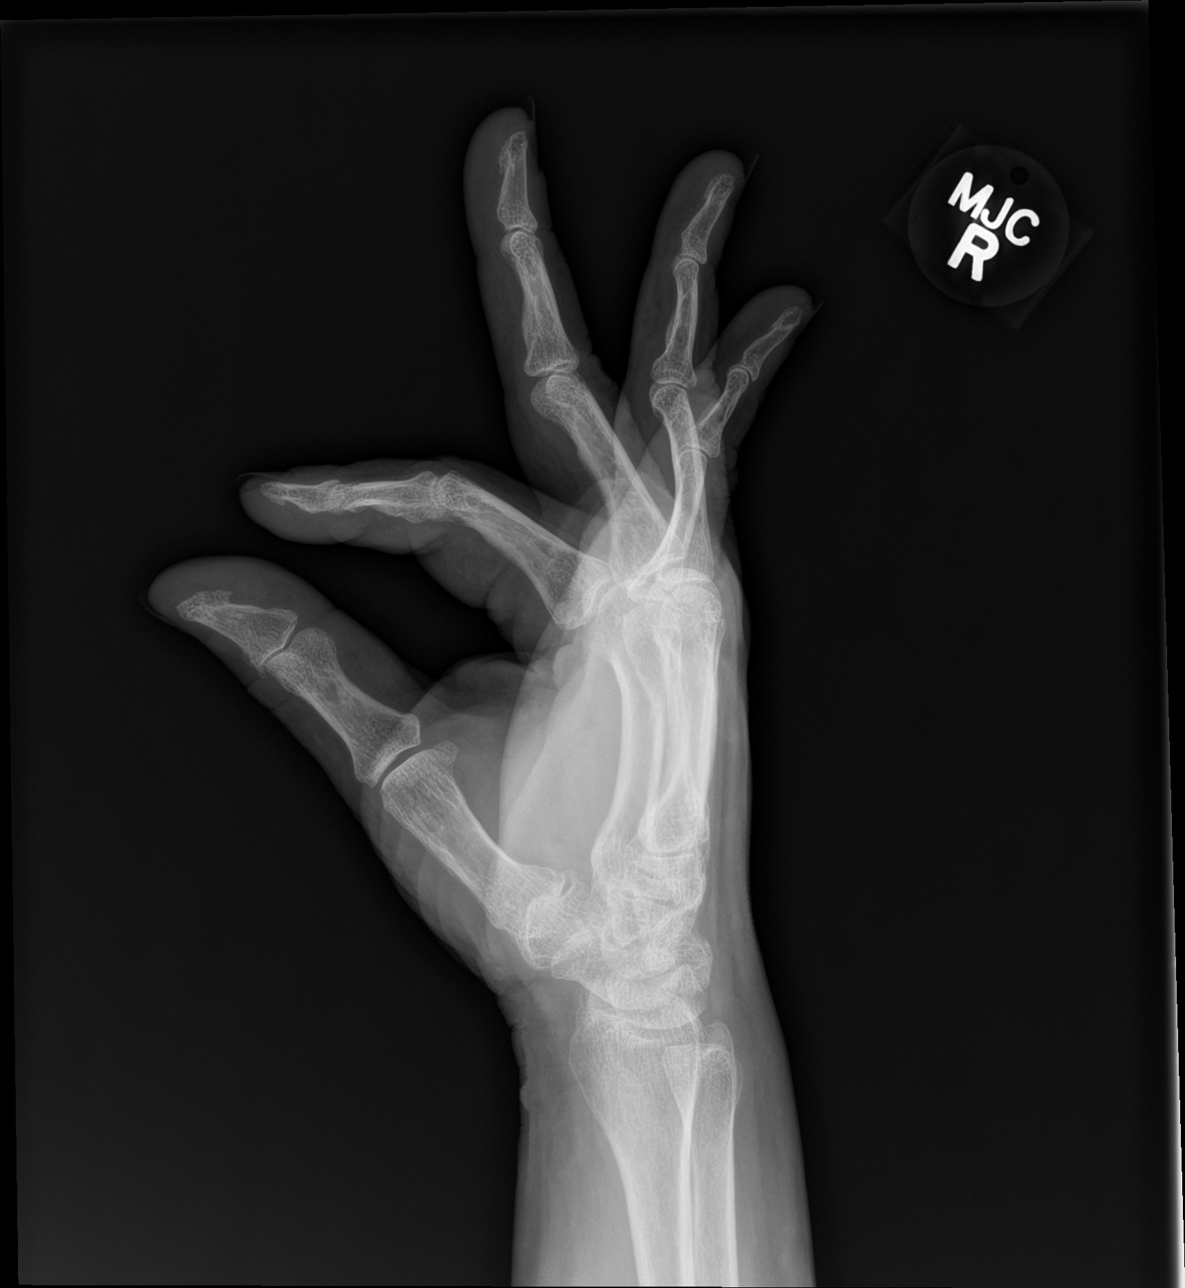

[3 of 3 positions shown; findings below may reference images not displayed]

FINDINGS: There is no evidence of fracture or dislocation. There is no
evidence of arthropathy or other focal bone abnormality. Soft
tissues are unremarkable.
IMPRESSION: Negative.

## 2022-09-12 ENCOUNTER — Encounter: Payer: Self-pay | Admitting: Gastroenterology

## 2022-09-12 ENCOUNTER — Ambulatory Visit (INDEPENDENT_AMBULATORY_CARE_PROVIDER_SITE_OTHER): Payer: No Typology Code available for payment source | Admitting: Gastroenterology

## 2022-09-12 VITALS — BP 164/90 | HR 84 | Ht 61.0 in | Wt 245.4 lb

## 2022-09-12 DIAGNOSIS — Z86010 Personal history of colonic polyps: Secondary | ICD-10-CM | POA: Diagnosis not present

## 2022-09-12 DIAGNOSIS — K625 Hemorrhage of anus and rectum: Secondary | ICD-10-CM

## 2022-09-12 DIAGNOSIS — K5731 Diverticulosis of large intestine without perforation or abscess with bleeding: Secondary | ICD-10-CM | POA: Diagnosis not present

## 2022-09-12 DIAGNOSIS — Z79899 Other long term (current) drug therapy: Secondary | ICD-10-CM

## 2022-09-12 DIAGNOSIS — K219 Gastro-esophageal reflux disease without esophagitis: Secondary | ICD-10-CM

## 2022-09-12 MED ORDER — SUTAB 1479-225-188 MG PO TABS: 24.0000 | ORAL_TABLET | Freq: Once | ORAL | 0 refills | Status: AC

## 2022-09-12 MED ORDER — OMEPRAZOLE 20 MG PO CPDR: 20.0000 mg | DELAYED_RELEASE_CAPSULE | Freq: Every day | ORAL | 3 refills | Status: DC

## 2022-09-12 NOTE — Patient Instructions (Addendum)
If your blood pressure at your visit was 140/90 or greater, please contact your primary care physician to follow up on this.  _______________________________________________________  If you are age 69 or older, your body mass index should be between 23-30. Your Body mass index is 46.36 kg/m. If this is out of the aforementioned range listed, please consider follow up with your Primary Care Provider.  If you are age 45 or younger, your body mass index should be between 19-25. Your Body mass index is 46.36 kg/m. If this is out of the aformentioned range listed, please consider follow up with your Primary Care Provider.   ________________________________________________________  The [facility] providers would like to encourage you to use [facility] to communicate with providers for non-urgent requests or questions.  Due to long hold times on the telephone, sending your provider a message by [facility] may be a faster and more efficient way to get a response.  Please allow 48 business hours for a response.  Please remember that this is for non-urgent requests.  _______________________________________________________  Dennis Bast have been scheduled for an endoscopy and colonoscopy. Please follow the written instructions given to you at your visit today. Please pick up your prep supplies at the pharmacy within the next 1-3 days. If you use inhalers (even only as needed), please bring them with you on the day of your procedure.  Please purchase the following medications over the counter and take as directed: Metamucil:Take daily  Please call if you have recurrent bleeding.  Decrease your omeprazole to 20 mg once daily.  We have sent a new prescription to your pharmacy.  If you can not tolerate this you can increase to 40 mg once daily.  Thank you for entrusting me with your care and for choosing [facility], Dr. Hotchkiss Cellar

## 2022-09-12 NOTE — Progress Notes (Signed)
HPI :  67 year old female with a history of recurrent GI bleeding due to colonic diverticulosis, history of colon polyps, GERD, last seen in the office in 2018.  Here to reestablish care for recurrent rectal bleeding.  Recall she had a screening colonoscopy with me in March 2018, she had pancolonic diverticulosis.  She also had internal hemorrhoids.  2 small adenomas removed at that time.  We had planned a 5 to 7-year follow-up.  She unfortunately presented to the hospital in 12/2017 with a lower GI bleed.  Colonoscopy showed pancolonic diverticulosis and blood throughout the colon but could not identify clear diverticulum from when she was bleeding.  Tagged RBC scan was negative.  Her symptoms resolved without intervention.  She presented again in November 2019 with similar symptoms, no colonoscopy or other imaging was performed, she resolved again without intervention.  She states she has been doing pretty well at baseline without any recurrent symptoms that she was aware of for the past few years.  She states stool form has been altered slightly, not well-formed, occasionally loose, no straining.  She is been using some MiraLAX as needed for constipation.  In December she had what sounds like 24 hours of passing bright red blood per rectum without stool.  She had passed multiple episodes at home which led her to the emergency room where her hemoglobin was stable in the 12's.  She states her symptoms stopped when she was in the emergency room and she was discharged.  She really has not had any recurrent bleeding since that time.  She has 1-2 bowel movements per day at baseline.  She was not found to have iron deficiency despite chronic microcytosis.  She was started on iron pills for treatment of RLS however.  She does take omeprazole 40 mg once daily for reflux.  She states she has had this for a very long time.  Her symptoms of GERD are pretty well-controlled.  No dysphagia.  She is never had a  prior EGD.  She has used tobacco in the past, BMI is 46.  She has no family history of esophageal cancer.  No family history of colon cancer.  She does have osteopenia with a T-score of -1.7 on DEXA in October 2023.   Colonoscopy 10/10/2016: The perianal and digital rectal examinations were                            normal.                           A 3 mm polyp was found in the cecum. The polyp was                            sessile. The polyp was removed with a cold snare.                            Resection and retrieval were complete.                           A 4 mm polyp was found in the hepatic flexure. The                            polyp was sessile. The  polyp was removed with a                            cold snare. Resection and retrieval were complete.                           Scattered medium-mouthed diverticula were found in                            the entire colon.                           Internal hemorrhoids were found during retroflexion.                           Anal papilla(e) were hypertrophied.                           The exam was otherwise without abnormality.  Surgical [P], hepatic flexure and cecum, polyp (2) - -TUBULAR ADENOMAS. -NO HIGH GRADE DYSPLASIA OR MALIGNANCY IDENTIFIED.   Colonoscopy 01/08/2018: for GI bleeding, many divertriculum noted throughout the colon with blood  Tagged RBC scan 01/10/2018 - nondiagnostic  CT renal stone study 03/07/22: IMPRESSION: 3 mm proximal right ureteral stone with mild right hydronephrosis. Diffuse colonic diverticulosis.  Past Medical History:  Diagnosis Date   Arthritis    Constipation    on stool softener   GERD (gastroesophageal reflux disease)    Hyperlipidemia    Hypertension    Kidney stones    Trouble swallowing 2013   following lap band   Urticaria      Past Surgical History:  Procedure Laterality Date   ABDOMINAL HYSTERECTOMY     BREAST BIOPSY Left 11/04/2019    BENIGN LYMPH NODE WITH  PARACORTICAL HYPERPLASIA   CHOLECYSTECTOMY     COLONOSCOPY N/A 01/08/2018   Procedure: COLONOSCOPY;  Surgeon: Yetta Flock, MD;  Location: [facility];  Service: Gastroenterology;  Laterality: N/A;   LAPAROSCOPIC GASTRIC BANDING     LAPAROSCOPIC REPAIR AND REMOVAL OF GASTRIC BAND  2013-2014   Family History  Problem Relation Age of Onset   Hypertension Mother    Stroke Mother    Diabetes Father    Alcohol abuse Father    Kidney disease Father    Colon polyps Brother    Cancer - Colon Brother    Alcohol abuse Brother    Heart disease Maternal Grandmother    Hypertension Son    Social History   Tobacco Use   Smoking status: Former    Types: Cigarettes    Quit date: 10/02/1972    Years since quitting: 49.9   Smokeless tobacco: Never  Vaping Use   Vaping Use: Never used  Substance Use Topics   Alcohol use: No   Drug use: No   Current Outpatient Medications  Medication Sig Dispense Refill   acetaminophen (TYLENOL) 500 MG tablet Take 1-2 tablets (500-1,000 mg total) by mouth daily as needed for moderate pain (pain). 60 tablet 1   amitriptyline (ELAVIL) 25 MG tablet TAKE 1 TABLET BY MOUTH EVERYDAY AT BEDTIME 90 tablet 0   amLODipine (NORVASC) 5 MG tablet Take 0.5 tablets (2.5 mg total) by mouth daily. 90 tablet 1   losartan (COZAAR) 100 MG tablet TAKE 1 TABLET  BY MOUTH EVERY DAY (Patient taking differently: Take 100 mg by mouth daily.) 90 tablet 2   potassium chloride (KLOR-CON) 10 MEQ tablet TAKE 1 TABLET BY MOUTH EVERY DAY (Patient taking differently: Take 10 mEq by mouth daily.) 90 tablet 2   pravastatin (PRAVACHOL) 20 MG tablet TAKE 1 TABLET BY MOUTH EVERY DAY 90 tablet 3   Sodium Sulfate-Mag Sulfate-KCl (SUTAB) [phone] MG TABS Take 24 tablets by mouth once for 1 dose. 24 tablet 0   spironolactone (ALDACTONE) 25 MG tablet TAKE 1 TABLET (25 MG TOTAL) BY MOUTH DAILY. 90 tablet 1   b complex vitamins capsule Take vitamin B complex three times daily, make sure the  complex is containing at least 30 mg of vitamin B6 (Patient not taking: Reported on 09/12/2022) 80 capsule 6   calcium carbonate (OSCAL) 1500 (600 Ca) MG TABS tablet Take 1 tablet by mouth 2 (two) times daily with a meal. (Patient not taking: Reported on 09/12/2022)     Ferrous Sulfate (IRON) 325 (65 Fe) MG TABS Take one tablet every other day. (Patient not taking: Reported on 09/12/2022) 15 tablet 0   omeprazole (PRILOSEC) 20 MG capsule Take 1 capsule (20 mg total) by mouth daily. 90 capsule 3   polyethylene glycol (MIRALAX / GLYCOLAX) 17 g packet Take 17 g by mouth every other day. (Patient not taking: Reported on 09/12/2022)     No current facility-administered medications for this visit.   No Known Allergies   Review of Systems: All systems reviewed and negative except where noted in HPI.   Lab Results  Component Value Date   WBC 8.3 07/27/2022   HGB 12.1 07/27/2022   HCT 39.6 07/27/2022   MCV 75.9 (L) 07/27/2022   PLT 284 07/27/2022    Lab Results  Component Value Date   CREATININE 0.87 07/27/2022   BUN 16 07/27/2022   NA 140 07/27/2022   K 3.8 07/27/2022   CL 106 07/27/2022   CO2 27 07/27/2022    Lab Results  Component Value Date   IRON 36 05/30/2022   TIBC 369 05/30/2022   FERRITIN 85 05/30/2022   Lab Results  Component Value Date   ALT 18 07/27/2022   AST 20 07/27/2022   ALKPHOS 90 07/27/2022   BILITOT 0.6 07/27/2022     Physical Exam: BP (!) 164/90 (BP Location: Left Arm, Patient Position: Sitting, Cuff Size: Large)   Pulse 84   Ht 5' 1"$  (1.549 m) Comment: height measured without shoes  Wt 245 lb 6 oz (111.3 kg)   BMI 46.36 kg/m  Constitutional: Pleasant,well-developed, female in no acute distress. HEENT: Normocephalic and atraumatic. Conjunctivae are normal. No scleral icterus. Neck supple.  Cardiovascular: Normal rate, regular rhythm.  Pulmonary/chest: Effort normal and breath sounds normal.  Abdominal: Soft, nondistended, nontender.  There are no  masses palpable.  Extremities: no edema Lymphadenopathy: No cervical adenopathy noted. Neurological: Alert and oriented to person place and time. Skin: Skin is warm and dry. No rashes noted. Psychiatric: Normal mood and affect. Behavior is normal.   ASSESSMENT: 67 y.o. female here for assessment of the following  1. Rectal bleeding   2. Diverticulosis of colon with hemorrhage   3. History of colon polyps   4. Gastroesophageal reflux disease, unspecified whether esophagitis present   5. Long-term current use of proton pump inhibitor therapy    History of recurrent diverticular bleeding in 2019 with 2 hospitalizations.  Classic symptoms of diverticular bleeding that resolved without intervention.  She had 24 hours  worth of recurrent bleeding this past December which led to ED visit.  Fortunately hemoglobin was stable and symptoms resolved during ED visit and she was discharged without recurrence of symptoms.  We discussed with diverticulosis is, risk for recurrent bleeding moving forward.  While I suspect she very likely had recurrent diverticular bleeding, she has a history of a few adenomas in the past, last colonoscopy 2018.  I offered her a colonoscopy for surveillance of polyps and to ensure no other source of her bleeding symptoms.  Following discussion of risks and benefits of this she wants to proceed.  Further recommendations pending the results, if she has recurrent bleeding in the interim she should contact me.  I recommend she take a daily fiber supplement to provide some regularity to her bowel habits in the interim.  We otherwise discussed her reflux history.  She is been on omeprazole 40 mg daily for some time now for treatment of reflux.  She is never had an EGD and given her age, duration of symptoms, BMI, tobacco history, I offered her an EGD to screen for Barrett's esophagus.  She wanted to proceed with this during her colonoscopy after discussion of risks and benefits.  In the  interim we discussed long-term risk benefits of chronic PPI use.  She does have osteopenia, at increased risk for bone fracture on chronic PPI.  Recommend we try reducing her dose of omeprazole to minimize side effects, she is agreeable to reduction to 20 mg daily.  If she does not tolerated she can increase dosing if needed.  Of note patient has hypertension and BP was elevated in the office today.  She has not been checking it at home too much.  She will take this at home and contact primary care if it remains elevated.  She agrees  PLAN: - schedule for EGD and colonoscopy at the [facility] - take metamucil or fiber daily to  provide regularity and bulk stools - call if recurrent bleeding moving forward - discussed long term risks of chronic PPI use. Trial of dose reduction of omeprazole to 85m / day and if she doesn't tolerate it, can go back to 425m- repeat BP, take at home and contact PCP if remains elevated  StJolly MangoMD [facility]  CC: JoMartiniqueBetty G, MD

## 2022-09-17 ENCOUNTER — Telehealth: Payer: Self-pay

## 2022-09-17 NOTE — Progress Notes (Signed)
Done in error.

## 2022-09-17 NOTE — Progress Notes (Unsigned)
Care Management & Coordination Services Pharmacy Team  Reason for Encounter: Hypertension  Contacted patient to discuss hypertension disease state. {US HC Outreach:28874}  SCHED FOLLOWUP MAY  Current antihypertensive regimen:  Amlodipine 5 mg 1/2 tablet daily Losartan 100 mg daily Spironolactone 25 mg daily Patient verbally confirms she is taking the above medications as directed. {yes/no:20286}  How often are you checking your Blood Pressure? {CHL HP BP Monitoring Frequency:[phone]}  she checks her blood pressure {timing:25218} {before/after:25217} taking her medication.  Current home BP readings:  DATE:             BP               PULSE   Wrist or arm cuff:  OTC medications including pseudoephedrine or NSAIDs?  Any readings above 180/100? {yes/no:20286} If yes any symptoms of hypertensive emergency? {hypertensive emergency symptoms:25354}  What recent interventions/DTPs have been made by any provider to improve Blood Pressure control since last CPP Visit: ***  Any recent hospitalizations or ED visits since last visit with CPP? {yes/no:20286}  What diet changes have been made to improve Blood Pressure Control?  Patient is not following any specific diet. Breakfast - patient will have toast. Lunch - patient does not eat lunch Dinner - patient will have a meal that contains meat and vegetables with a salad Caffeine intake -  Salt intake -   What exercise is being done to improve your Blood Pressure Control?  Patient states she does a lot of walking around the house and housework   Adherence Review: Is the patient currently on ACE/ARB medication? Yes Does the patient have >5 day gap between last estimated fill dates? No  Care Gaps: AWV - scheduled 02/22/2023 Next appointment -  Shingrix - never done Covid - overdue   Star Rating Drug: Pravastatin 20 mg - last filled 04/01/2022 90 [facility] verified with pharm tech Losartan 100 mg - last filled 07/10/2022 90 DS  at [facility]  Chart Updates: Recent office visits:  08/22/2022 [name] MD - Patient was seen for Hypertension and additional concerns. No medication changes.  Recent consult visits:  09/12/2022 Ke[place]e Cellar MD (GI) - Patient was seen for rectal bleeding and additional concerns. Started Sutab, Decreased Omeprazole to 20 mg daily. Discontinued Tamsulosin, Tizanidine and Tramadol.  09/05/2022 Durel Salts DO (rehab) - Patient was seen for leg cramping and additional concerns. Started B Complex tid, Ferrous Sulfate 325 mg qod and Tizanidine 4 mg prn.   Hospital visits:  Patient was seen at [facility] ED on 07/27/2022 (7 hour) due to rectal bleeding. Discharge date was 07/28/2022.    New?Medications Started at [facility] Discharge:?? No medications started  Medication Changes at Hospital Discharge: No medication changes. Medications Discontinued at Hospital Discharge: Diclofenac Ibuprofen Medications that remain the same after Hospital Discharge:??  -All other medications will remain the same.    Medications: Outpatient Encounter Medications as of 09/17/2022  Medication Sig   acetaminophen (TYLENOL) 500 MG tablet Take 1-2 tablets (500-1,000 mg total) by mouth daily as needed for moderate pain (pain).   amitriptyline (ELAVIL) 25 MG tablet TAKE 1 TABLET BY MOUTH EVERYDAY AT BEDTIME   amLODipine (NORVASC) 5 MG tablet Take 0.5 tablets (2.5 mg total) by mouth daily.   b complex vitamins capsule Take vitamin B complex three times daily, make sure the complex is containing at least 30 mg of vitamin B6 (Patient not taking: Reported on 09/12/2022)   calcium carbonate (OSCAL) 1500 (600 Ca) MG TABS tablet  Take 1 tablet by mouth 2 (two) times daily with a meal. (Patient not taking: Reported on 09/12/2022)   Ferrous Sulfate (IRON) 325 (65 Fe) MG TABS Take one tablet every other day. (Patient not taking: Reported on 09/12/2022)   losartan (COZAAR) 100 MG tablet TAKE 1 TABLET BY MOUTH EVERY DAY  (Patient taking differently: Take 100 mg by mouth daily.)   omeprazole (PRILOSEC) 20 MG capsule Take 1 capsule (20 mg total) by mouth daily.   polyethylene glycol (MIRALAX / GLYCOLAX) 17 g packet Take 17 g by mouth every other day. (Patient not taking: Reported on 09/12/2022)   potassium chloride (KLOR-CON) 10 MEQ tablet TAKE 1 TABLET BY MOUTH EVERY DAY (Patient taking differently: Take 10 mEq by mouth daily.)   pravastatin (PRAVACHOL) 20 MG tablet TAKE 1 TABLET BY MOUTH EVERY DAY   spironolactone (ALDACTONE) 25 MG tablet TAKE 1 TABLET (25 MG TOTAL) BY MOUTH DAILY.   No facility-administered encounter medications on file as of 09/17/2022.  Fill History:   Dispensed Days Supply Quantity Provider Pharmacy  SPIRONOLACT 25MG TAB 07/10/2022 90 90 tablet      Dispensed Days Supply Quantity Provider Pharmacy  PRAVASTATIN 20MG TAB 07/10/2022 90 90 tablet      Dispensed Days Supply Quantity Provider Pharmacy  POT CHLOR KC 10MEQ ER TAB 07/10/2022 90 90 tablet      Dispensed Days Supply Quantity Provider Pharmacy  OMEPRAZOLE 40MG CAP 02/12/2022 90 90 capsule      Dispensed Days Supply Quantity Provider Pharmacy  LOSARTAN 100MG TAB 07/10/2022 90 90 tablet      Dispensed Days Supply Quantity Provider Pharmacy  AMLODIPINE BESYLATE 5 MG TAB 03/31/2021 90  Martinique, [name], MD [id number]...  AMLODIPINE BESYLATE 5 MG TAB 12/19/2020 90 90 each JMartinique [name], MD     Dispensed Days Supply Quantity Provider Pharmacy  AMITRIPTYLINE HCL 25 MG TAB 06/25/2022 90 90 each      Recent Office Vitals: BP Readings from Last 3 Encounters:  09/12/22 (!) 164/90  09/05/22 (!) 146/89  08/22/22 138/88   Pulse Readings from Last 3 Encounters:  09/12/22 84  09/05/22 84  08/22/22 94    Wt Readings from Last 3 Encounters:  09/12/22 245 lb 6 oz (111.3 kg)  09/05/22 243 lb (110.2 kg)  08/22/22 246 lb 4 oz (111.7 kg)     Kidney Function Lab Results  Component Value Date/Time   CREATININE 0.87  07/27/2022 08:25 PM   CREATININE 0.97 05/30/2022 12:36 PM   CREATININE 1.03 (H) 05/03/2016 11:52 AM   CREATININE 0.93 10/31/2015 10:30 AM   GFR 61.88 08/01/2021 11:31 AM   GFRNONAA >60 07/27/2022 08:25 PM   GFRNONAA 58 (L) 05/03/2016 11:52 AM   GFRAA 68 04/19/2020 10:51 AM   GFRAA 67 05/03/2016 11:52 AM       Latest Ref Rng & Units 07/27/2022    8:25 PM 05/30/2022   12:36 PM 03/07/2022   12:49 AM  BMP  Glucose 70 - 99 mg/dL 100  92  167   BUN 8 - 23 mg/dL 16  13  12   $ Creatinine 0.44 - 1.00 mg/dL 0.87  0.97  1.17   BUN/Creat Ratio 12 - 28  13    Sodium 135 - 145 mmol/L 140  142  137   Potassium 3.5 - 5.1 mmol/L 3.8  3.7  3.8   Chloride 98 - 111 mmol/L 106  101  104   CO2 22 - 32 mmol/L 27  27  24   Calcium 8.9 - 10.3 mg/dL 9.5  8.9  8.9    [place] Pharmacist Assistant [phone]

## 2022-09-22 ENCOUNTER — Other Ambulatory Visit: Payer: Self-pay | Admitting: Physical Medicine and Rehabilitation

## 2022-10-26 ENCOUNTER — Other Ambulatory Visit: Payer: Self-pay | Admitting: Family Medicine

## 2022-10-26 DIAGNOSIS — E785 Hyperlipidemia, unspecified: Secondary | ICD-10-CM

## 2022-11-06 ENCOUNTER — Encounter: Payer: Self-pay | Admitting: Gastroenterology

## 2022-11-06 ENCOUNTER — Ambulatory Visit: Payer: No Typology Code available for payment source | Admitting: Gastroenterology

## 2022-11-06 VITALS — BP 125/74 | HR 74 | Temp 96.0°F | Resp 11 | Ht 61.0 in | Wt 245.0 lb

## 2022-11-06 DIAGNOSIS — K219 Gastro-esophageal reflux disease without esophagitis: Secondary | ICD-10-CM | POA: Diagnosis not present

## 2022-11-06 DIAGNOSIS — K635 Polyp of colon: Secondary | ICD-10-CM | POA: Diagnosis not present

## 2022-11-06 DIAGNOSIS — Z86010 Personal history of colonic polyps: Secondary | ICD-10-CM

## 2022-11-06 DIAGNOSIS — K2950 Unspecified chronic gastritis without bleeding: Secondary | ICD-10-CM | POA: Diagnosis not present

## 2022-11-06 DIAGNOSIS — Z09 Encounter for follow-up examination after completed treatment for conditions other than malignant neoplasm: Secondary | ICD-10-CM

## 2022-11-06 DIAGNOSIS — D120 Benign neoplasm of cecum: Secondary | ICD-10-CM | POA: Diagnosis not present

## 2022-11-06 DIAGNOSIS — I10 Essential (primary) hypertension: Secondary | ICD-10-CM | POA: Diagnosis not present

## 2022-11-06 DIAGNOSIS — D122 Benign neoplasm of ascending colon: Secondary | ICD-10-CM

## 2022-11-06 DIAGNOSIS — K625 Hemorrhage of anus and rectum: Secondary | ICD-10-CM | POA: Diagnosis not present

## 2022-11-06 DIAGNOSIS — K317 Polyp of stomach and duodenum: Secondary | ICD-10-CM

## 2022-11-06 MED ORDER — SODIUM CHLORIDE 0.9 % IV SOLN: 500.0000 mL | Freq: Once | INTRAVENOUS | Status: DC

## 2022-11-06 NOTE — Op Note (Addendum)
[facility] Patient Name: Jennifer CriglerJacqueline Davila Procedure Date: 11/06/2022 2:49 PM MRN: [id number] Endoscopist: Viviann SpareSteven P. Adela LankArmbruster , MD, [phone] Age: [age] Referring MD:  Date of Birth: 1956-01-14 Gender: Female Account #: [id number] Procedure:                Colonoscopy Indications:              Rectal bleeding, history of diverticular bleeding,                            history of colon polyps - 2 adenomas removed 2018 Medicines:                Monitored Anesthesia Care Procedure:                Pre-Anesthesia Assessment:                           - Prior to the procedure, a History and Physical                            was performed, and patient medications and                            allergies were reviewed. The patient's tolerance of                            previous anesthesia was also reviewed. The risks                            and benefits of the procedure and the sedation                            options and risks were discussed with the patient.                            All questions were answered, and informed consent                            was obtained. Prior Anticoagulants: The patient has                            taken no anticoagulant or antiplatelet agents. ASA                            Grade Assessment: III - A patient with severe                            systemic disease. After reviewing the risks and                            benefits, the patient was deemed in satisfactory                            condition to undergo the procedure.  After obtaining informed consent, the colonoscope                            was passed under direct vision. Throughout the                            procedure, the patient's blood pressure, pulse, and                            oxygen saturations were monitored continuously. The                            Olympus PCF-[id number] (#[id number]) Colonoscope was                             introduced through the anus and advanced to the the                            cecum, identified by appendiceal orifice and                            ileocecal valve. The colonoscopy was performed                            without difficulty. The patient tolerated the                            procedure well. The quality of the bowel                            preparation was good. The ileocecal valve,                            appendiceal orifice, and rectum were photographed. Scope In: 3:11:05 PM Scope Out: 3:26:41 PM Scope Withdrawal Time: 0 hours 12 minutes 14 seconds  Total Procedure Duration: 0 hours 15 minutes 36 seconds  Findings:                 The perianal and digital rectal examinations were                            normal.                           Many medium-mouthed diverticula were found in the                            entire colon, highest burden in the left colon.                           A 3 mm polyp was found in the cecum. The polyp was                            sessile. The polyp was removed with a cold snare.  Resection and retrieval were complete.                           A 3 mm polyp was found in the ascending colon. The                            polyp was sessile. The polyp was removed with a                            cold snare. Resection and retrieval were complete.                           Internal hemorrhoids were found during retroflexion.                           The exam was otherwise without abnormality. Complications:            No immediate complications. Estimated blood loss:                            Minimal. Estimated Blood Loss:     Estimated blood loss was minimal. Impression:               - Diverticulosis in the entire examined colon.                           - One 3 mm polyp in the cecum, removed with a cold                            snare. Resected and retrieved.                           - One 3 mm  polyp in the ascending colon, removed                            with a cold snare. Resected and retrieved.                           - Internal hemorrhoids.                           - The examination was otherwise normal.                           Recent rectal bleeding could have been hemorrhoidal                            vs. mild diverticular bleeding, no high risk or                            concerning pathology.                           - The GI Genius (intelligent endoscopy module),  computer-aided polyp detection system powered by AI                            was utilized to detect colorectal polyps through                            enhanced visualization during colonoscopy. Recommendation:           - Patient has a contact number available for                            emergencies. The signs and symptoms of potential                            delayed complications were discussed with the                            patient. Return to normal activities tomorrow.                            Written discharge instructions were provided to the                            patient.                           - Resume previous diet.                           - Continue present medications.                           - Await pathology results. Viviann Spare P. Taevon Aschoff, MD 11/06/2022 3:31:24 PM This report has been signed electronically.

## 2022-11-06 NOTE — Progress Notes (Signed)
Vss nad trans to pacu 

## 2022-11-06 NOTE — Progress Notes (Signed)
Gastroenterology History and Physical   Primary Care Physician:  [place], [name], MD   Reason for Procedure:   Rectal bleeding, history of polyps, GERD  Plan:    EGD and colonoscopy     HPI: Jennifer Davila is a 67 y.o. female  here for colonoscopy and EGD to evaluate issues as above. Chronic GERD on omeprazole, EGD to screen for BE. Had a few adenomas removed 2018, history of recurrent lower GI bleeding due to diverticulosis, recurrent bleeding this December.  No changes since I have seen her in the office in February.   No family history of colon cancer known. Otherwise feels well without any cardiopulmonary symptoms.   I have discussed risks / benefits of anesthesia and endoscopic procedure with Jennifer Davila and they wish to proceed with the exams as outlined today.    Past Medical History:  Diagnosis Date   Arthritis    Constipation    on stool softener   GERD (gastroesophageal reflux disease)    Hyperlipidemia    Hypertension    Kidney stones    Trouble swallowing 2013   following lap band   Urticaria     Past Surgical History:  Procedure Laterality Date   ABDOMINAL HYSTERECTOMY     BREAST BIOPSY Left 11/04/2019    BENIGN LYMPH NODE WITH PARACORTICAL HYPERPLASIA   CHOLECYSTECTOMY     COLONOSCOPY N/A 01/08/2018   Procedure: COLONOSCOPY;  Surgeon: Jennifer Deeds, MD;  Location: [facility];  Service: Gastroenterology;  Laterality: N/A;   LAPAROSCOPIC GASTRIC BANDING     LAPAROSCOPIC REPAIR AND REMOVAL OF GASTRIC BAND  2013-2014    Prior to Admission medications   Medication Sig Start Date End Date Taking? Authorizing Provider  amitriptyline (ELAVIL) 25 MG tablet TAKE 1 TABLET BY MOUTH EVERYDAY AT BEDTIME 09/24/22  Yes Davila, Morgan C, DO  losartan (COZAAR) 100 MG tablet TAKE 1 TABLET BY MOUTH EVERY DAY Patient taking differently: Take 100 mg by mouth daily. 01/08/22  Yes [place], [name], MD  omeprazole (PRILOSEC) 20 MG capsule Take 1  capsule (20 mg total) by mouth daily. 09/12/22  Yes Jennifer Davila, Jennifer Rayas, MD  acetaminophen (TYLENOL) 500 MG tablet Take 1-2 tablets (500-1,000 mg total) by mouth daily as needed for moderate pain (pain). 03/22/21   Jennifer Mustard, MD  amLODipine (NORVASC) 5 MG tablet Take 0.5 tablets (2.5 mg total) by mouth daily. Patient not taking: Reported on 11/06/2022 07/04/22   [place], [name], MD  b complex vitamins capsule Take vitamin B complex three times daily, make sure the complex is containing at least 30 mg of vitamin B6 Patient not taking: Reported on 09/12/2022 09/05/22   Jennifer Sheriff, DO  calcium carbonate (OSCAL) 1500 (600 Ca) MG TABS tablet Take 1 tablet by mouth 2 (two) times daily with a meal. Patient not taking: Reported on 09/12/2022    [provider]  Ferrous Sulfate (IRON) 325 (65 Fe) MG TABS Take one tablet every other day. Patient not taking: Reported on 09/12/2022 09/05/22   Elijah Birk C, DO  polyethylene glycol (MIRALAX / GLYCOLAX) 17 g packet Take 17 g by mouth every other day. Patient not taking: Reported on 09/12/2022    [provider]  potassium chloride (KLOR-CON) 10 MEQ tablet TAKE 1 TABLET BY MOUTH EVERY DAY Patient not taking: Reported on 11/06/2022 01/08/22   [place], [name], MD  pravastatin (PRAVACHOL) 20 MG tablet TAKE 1 TABLET BY MOUTH EVERY DAY Patient not taking: Reported  on 11/06/2022 10/29/22   [place], [name], MD  spironolactone (ALDACTONE) 25 MG tablet TAKE 1 TABLET (25 MG TOTAL) BY MOUTH DAILY. Patient not taking: Reported on 11/06/2022 04/03/22   [place], [name], MD    Current Outpatient Medications  Medication Sig Dispense Refill   amitriptyline (ELAVIL) 25 MG tablet TAKE 1 TABLET BY MOUTH EVERYDAY AT BEDTIME 90 tablet 0   losartan (COZAAR) 100 MG tablet TAKE 1 TABLET BY MOUTH EVERY DAY (Patient taking differently: Take 100 mg by mouth daily.) 90 tablet 2   omeprazole (PRILOSEC) 20 MG capsule Take 1 capsule (20 mg total) by mouth daily. 90 capsule 3    acetaminophen (TYLENOL) 500 MG tablet Take 1-2 tablets (500-1,000 mg total) by mouth daily as needed for moderate pain (pain). 60 tablet 1   amLODipine (NORVASC) 5 MG tablet Take 0.5 tablets (2.5 mg total) by mouth daily. (Patient not taking: Reported on 11/06/2022) 90 tablet 1   b complex vitamins capsule Take vitamin B complex three times daily, make sure the complex is containing at least 30 mg of vitamin B6 (Patient not taking: Reported on 09/12/2022) 80 capsule 6   calcium carbonate (OSCAL) 1500 (600 Ca) MG TABS tablet Take 1 tablet by mouth 2 (two) times daily with a meal. (Patient not taking: Reported on 09/12/2022)     Ferrous Sulfate (IRON) 325 (65 Fe) MG TABS Take one tablet every other day. (Patient not taking: Reported on 09/12/2022) 15 tablet 0   polyethylene glycol (MIRALAX / GLYCOLAX) 17 g packet Take 17 g by mouth every other day. (Patient not taking: Reported on 09/12/2022)     potassium chloride (KLOR-CON) 10 MEQ tablet TAKE 1 TABLET BY MOUTH EVERY DAY (Patient not taking: Reported on 11/06/2022) 90 tablet 2   pravastatin (PRAVACHOL) 20 MG tablet TAKE 1 TABLET BY MOUTH EVERY DAY (Patient not taking: Reported on 11/06/2022) 90 tablet 3   spironolactone (ALDACTONE) 25 MG tablet TAKE 1 TABLET (25 MG TOTAL) BY MOUTH DAILY. (Patient not taking: Reported on 11/06/2022) 90 tablet 1   Current Facility-Administered Medications  Medication Dose Route Frequency Provider Last Rate Last Admin   0.9 %  sodium chloride infusion  500 mL Intravenous Once Tamica Covell, Jennifer RayasSteven P, MD        Allergies as of 11/06/2022   (No Known Allergies)    Family History  Problem Relation Age of Onset   Hypertension Mother    Stroke Mother    Diabetes Father    Alcohol abuse Father    Kidney disease Father    Colon polyps Brother    Cancer - Colon Brother    Alcohol abuse Brother    Heart disease Maternal Grandmother    Hypertension Son    Colon cancer Neg Hx    Esophageal cancer Neg Hx    Rectal cancer Neg Hx     Stomach cancer Neg Hx     Social History   Socioeconomic History   Marital status: Married    Spouse name: Not on file   Number of children: 1   Years of education: Not on file   Highest education level: Not on file  Occupational History   Occupation: caregiver  Tobacco Use   Smoking status: Former    Types: Cigarettes    Quit date: 10/02/1972    Years since quitting: 50.1   Smokeless tobacco: Never  Vaping Use   Vaping Use: Never used  Substance and Sexual Activity   Alcohol use: No  Drug use: No   Sexual activity: Never  Other Topics Concern   Not on file  Social History Narrative   Not on file   Social Determinants of Health   Financial Resource Strain: Low Risk  (02/19/2022)   Overall Financial Resource Strain (CARDIA)    Difficulty of Paying Living Expenses: Not hard at all  Food Insecurity: No Food Insecurity (02/19/2022)   Hunger Vital Sign    Worried About Running Out of Food in the Last Year: Never true    Ran Out of Food in the Last Year: Never true  Transportation Needs: No Transportation Needs (02/19/2022)   PRAPARE - Administrator, Civil Service (Medical): No    Lack of Transportation (Non-Medical): No  Physical Activity: Inactive (02/19/2022)   Exercise Vital Sign    Days of Exercise per Week: 0 days    Minutes of Exercise per Session: 0 min  Stress: No Stress Concern Present (02/19/2022)   [employer] of Occupational Health - Occupational Stress Questionnaire    Feeling of Stress : Not at all  Social Connections: Socially Integrated (02/19/2022)   Social Connection and Isolation Panel [NHANES]    Frequency of Communication with Friends and Family: More than three times a week    Frequency of Social Gatherings with Friends and Family: More than three times a week    Attends Religious Services: More than 4 times per year    Active Member of [employer] or Organizations: Yes    Attends Engineer, structural: More than 4 times per  year    Marital Status: Married  Catering manager Violence: Not At Risk (02/19/2022)   Humiliation, Afraid, Rape, and Kick questionnaire    Fear of Current or Ex-Partner: No    Emotionally Abused: No    Physically Abused: No    Sexually Abused: No    Review of Systems: All other review of systems negative except as mentioned in the HPI.  Physical Exam: Vital signs BP (!) 160/81   Pulse 79   Temp (!) 96 F (35.6 C) (Skin)   Ht 5\' 1"  (1.549 m)   Wt 245 lb (111.1 kg)   SpO2 98%   BMI 46.29 kg/m   General:   Alert,  Well-developed, pleasant and cooperative in NAD Lungs:  Clear throughout to auscultation.   Heart:  Regular rate and rhythm Abdomen:  Soft, nontender and nondistended.   Neuro/Psych:  Alert and cooperative. Normal mood and affect. A and O x 3  Harlin Rain, MD [facility]

## 2022-11-06 NOTE — Progress Notes (Signed)
Called to room to assist during endoscopic procedure.  Patient ID and intended procedure confirmed with present staff. Received instructions for my participation in the procedure from the performing physician.  

## 2022-11-06 NOTE — Progress Notes (Signed)
Pt's states no medical or surgical changes since previsit or office visit. 

## 2022-11-06 NOTE — Patient Instructions (Addendum)
- Patient has a contact number available for                            emergencies. The signs and symptoms of potential                            delayed complications were discussed with the                            patient. Return to normal activities tomorrow.                            Written discharge instructions were provided to the                            patient.                           - Resume previous diet.                           - Continue present medications.                           - 2 polyps removed and sent to pathology.  Await pathology results.  Handouts given to pt (polyps, diverticulosis, hemorrhoids)    YOU HAD AN ENDOSCOPIC PROCEDURE TODAY AT THE [facility]:   Refer to the procedure report that was given to you for any specific questions about what was found during the examination.  If the procedure report does not answer your questions, please call your gastroenterologist to clarify.  If you requested that your care partner not be given the details of your procedure findings, then the procedure report has been included in a sealed envelope for you to review at your convenience later.  YOU SHOULD EXPECT: Some feelings of bloating in the abdomen. Passage of more gas than usual.  Walking can help get rid of the air that was put into your GI tract during the procedure and reduce the bloating. If you had a lower endoscopy (such as a colonoscopy or flexible sigmoidoscopy) you may notice spotting of blood in your stool or on the toilet paper. If you underwent a bowel prep for your procedure, you may not have a normal bowel movement for a few days.  Please Note:  You might notice some irritation and congestion in your nose or some drainage.  This is from the oxygen used during your procedure.  There is no need for concern and it should clear up in a day or so.  SYMPTOMS TO REPORT IMMEDIATELY:  Following lower endoscopy  (colonoscopy or flexible sigmoidoscopy):  Excessive amounts of blood in the stool  Significant tenderness or worsening of abdominal pains  Swelling of the abdomen that is new, acute  Fever of 100F or higher  Following upper endoscopy (EGD)  Vomiting of blood or coffee ground material  New chest pain or pain under the shoulder blades  Painful or persistently difficult swallowing  New shortness of breath  Fever of 100F or higher  Black, tarry-looking stools  For urgent or emergent issues, a gastroenterologist can be reached at any hour  by calling [phone]. Do not use MyChart messaging for urgent concerns.    DIET:  We do recommend a small meal at first, but then you may proceed to your regular diet.  Drink plenty of fluids but you should avoid alcoholic beverages for 24 hours.  ACTIVITY:  You should plan to take it easy for the rest of today and you should NOT DRIVE or use heavy machinery until tomorrow (because of the sedation medicines used during the test).    FOLLOW UP: Our staff will call the number listed on your records the next business day following your procedure.  We will call around 7:15- 8:00 am to check on you and address any questions or concerns that you may have regarding the information given to you following your procedure. If we do not reach you, we will leave a message.     If any biopsies were taken you will be contacted by phone or by letter within the next 1-3 weeks.  Please call us at [phone] if you have not heard about the biopsies in 3 weeks.    SIGNATURES/CONFIDENTIALITY: You and/or your care partner have signed paperwork which will be entered into your electronic medical record.  These signatures attest to the fact that that the information above on your After Visit Summary has been reviewed and is understood.  Full responsibility of the confidentiality of this discharge information lies with you and/or your care-partner.

## 2022-11-06 NOTE — Op Note (Signed)
[facility] Patient Name: Jennifer CriglerJacqueline Holster Procedure Date: 11/06/2022 2:49 PM MRN: [id number] Endoscopist: Viviann SpareSteven P. Adela LankArmbruster , MD, [phone] Age: [age] Referring MD:  Date of Birth: 1956/05/18 Gender: Female Account #: [id number] Procedure:                Upper GI endoscopy Indications:              longstanding gastro-esophageal reflux disease on                            omeprazole - rule out Barrett's Medicines:                Monitored Anesthesia Care Procedure:                Pre-Anesthesia Assessment:                           - Prior to the procedure, a History and Physical                            was performed, and patient medications and                            allergies were reviewed. The patient's tolerance of                            previous anesthesia was also reviewed. The risks                            and benefits of the procedure and the sedation                            options and risks were discussed with the patient.                            All questions were answered, and informed consent                            was obtained. Prior Anticoagulants: The patient has                            taken no anticoagulant or antiplatelet agents. ASA                            Grade Assessment: III - A patient with severe                            systemic disease. After reviewing the risks and                            benefits, the patient was deemed in satisfactory                            condition to undergo the procedure.  After obtaining informed consent, the endoscope was                            passed under direct vision. Throughout the                            procedure, the patient's blood pressure, pulse, and                            oxygen saturations were monitored continuously. The                            GIF [id number] #[id number] was introduced through the                            mouth, and  advanced to the second part of duodenum.                            The upper GI endoscopy was accomplished without                            difficulty. The patient tolerated the procedure                            well. Scope In: Scope Out: Findings:                 Esophagogastric landmarks were identified: the                            Z-line was found at 34 cm, the gastroesophageal                            junction was found at 34 cm and the upper extent of                            the gastric folds was found at 37 cm from the                            incisors.                           A 3 cm hiatal hernia was present.                           The Z-line was slightly irregular - did not meet                            criteria for Barrett's.                           The exam of the esophagus was otherwise normal.                           Patchy mildly erythematous mucosa was  found in the                            gastric antrum.                           A few small sessile polyps were found in the                            gastric fundus and in the gastric body. Likely                            fundic gland polyps. Biopsies were taken with a                            cold forceps for histology.                           The exam of the stomach was otherwise normal.                           Biopsies were taken with a cold forceps for                            Helicobacter pylori testing.                           The examined duodenum was normal. Complications:            No immediate complications. Estimated blood loss:                            Minimal. Estimated Blood Loss:     Estimated blood loss was minimal. Impression:               - Esophagogastric landmarks identified.                           - 3 cm hiatal hernia.                           - Z-line slightly irregular but did not meet                            criteria for Barrett's.                            - Erythematous mucosa in the antrum.                           - A few gastric polyps. Biopsied.                           - Normal stomach otherwise - biopsies taken to rule                            out H pylori                           -  Normal examined duodenum. Recommendation:           - Patient has a contact number available for                            emergencies. The signs and symptoms of potential                            delayed complications were discussed with the                            patient. Return to normal activities tomorrow.                            Written discharge instructions were provided to the                            patient.                           - Resume previous diet.                           - Continue present medications.                           - Await pathology results. Viviann Spare P. Lorali Khamis, MD 11/06/2022 3:35:28 PM This report has been signed electronically.

## 2022-11-07 ENCOUNTER — Telehealth: Payer: Self-pay | Admitting: *Deleted

## 2022-11-07 NOTE — Telephone Encounter (Signed)
  Follow up Call-     11/06/2022    1:08 PM  Call back number  Post procedure Call Back phone  # [phone]  Permission to leave phone message Yes     Patient questions:  Do you have a fever, pain , or abdominal swelling? No. Pain Score  0 *  Have you tolerated food without any problems? Yes.    Have you been able to return to your normal activities? Yes.    Do you have any questions about your discharge instructions: Diet   No. Medications  No. Follow up visit  No.  Do you have questions or concerns about your Care? No.  Actions: * If pain score is 4 or above: No action needed, pain <4.

## 2022-11-07 NOTE — Telephone Encounter (Signed)
Returned pts call.  She states that she is having pain in her chest and in her back between her shoulder blades that only happens when taking a deep breath.  She feels that it is air that she needs to expel and I told her that I agree.  She is having no other issues and has been able to eat and drink without any issues.  Advised pt to try OTC gas meds and also to drink warm fluids.  Pt is to call back should symptoms worsen.

## 2022-11-07 NOTE — Telephone Encounter (Signed)
Inbound call from patient complaining of chest pain and pain when breathing. Requesting to speak with a nurse.  Thank you

## 2022-11-07 NOTE — Telephone Encounter (Signed)
Thanks [employer].  She had no high risk interventions from her procedure yesterday, my concern for perforation or problems related to the procedure itself is quite low.  That being said, if her symptoms fail to improve or worsen she may need to go to the ED to rule out other causes for chest pain and breathing problems.  If she is not feeling better she should go to the ED or contact us for further evaluation.  Thanks

## 2022-11-08 ENCOUNTER — Telehealth: Payer: Self-pay

## 2022-11-08 NOTE — Progress Notes (Signed)
Care Management & Coordination Services Pharmacy Team  Reason for Encounter: Hypertension  Contacted patient to discuss hypertension disease state. Unsuccessful outreach. Left voicemail for patient to return call. Multiple attempts    Current antihypertensive regimen:  Amlodipine 5 mg 1/2 tablet daily Losartan 100 mg daily Spironolactone 25 mg daily Patient verbally confirms she is taking the above medications as directed.   How often are you checking your Blood Pressure?   she checks her blood pressure   taking her medication.  Current home BP readings:  DATE:             BP               PULSE   Wrist or arm cuff:  OTC medications including pseudoephedrine or NSAIDs?  Any readings above 180/100?  If yes any symptoms of hypertensive emergency?   What recent interventions/DTPs have been made by any provider to improve Blood Pressure control since last CPP Visit:   Any recent hospitalizations or ED visits since last visit with CPP?  What diet changes have been made to improve Blood Pressure Control?  Patient follows Breakfast -  Lunch -  Dinner -  Caffeine intake -  Salt intake -   What exercise is being done to improve your Blood Pressure Control?    Adherence Review: Is the patient currently on ACE/ARB medication? Yes Does the patient have >5 day gap between last estimated fill dates? Yes  Care Gaps: AWV - completed 02/22/2023, scheduled 02/22/2023 Next appointment -  Shingrix - never done Covid - overdue   Star Rating Drug: Pravastatin 20 mg - [facility]  Losartan 100 mg - last filled [facility]/10/2022 [facility] verified with pharm tech   Chart Updates: Recent office visits:  None  Recent consult visits:  11/06/2022 Ileene PatrickSteven Armbruster MD (GI) - Patient was seen for colonoscopy and EGD to evaluate Rectal bleeding, history of polyps, GERD   Hospital visits:  None  Medications: Outpatient Encounter Medications as of 11/08/2022   Medication Sig   acetaminophen (TYLENOL) 500 MG tablet Take 1-2 tablets (500-1,000 mg total) by mouth daily as needed for moderate pain (pain).   amitriptyline (ELAVIL) 25 MG tablet TAKE 1 TABLET BY MOUTH EVERYDAY AT BEDTIME   amLODipine (NORVASC) 5 MG tablet Take 0.5 tablets (2.5 mg total) by mouth daily. (Patient not taking: Reported on 11/06/2022)   b complex vitamins capsule Take vitamin B complex three times daily, make sure the complex is containing at least 30 mg of vitamin B6 (Patient not taking: Reported on 09/12/2022)   calcium carbonate (OSCAL) 1500 (600 Ca) MG TABS tablet Take 1 tablet by mouth 2 (two) times daily with a meal. (Patient not taking: Reported on 09/12/2022)   Ferrous Sulfate (IRON) 325 (65 Fe) MG TABS Take one tablet every other day. (Patient not taking: Reported on 09/12/2022)   losartan (COZAAR) 100 MG tablet TAKE 1 TABLET BY MOUTH EVERY DAY (Patient taking differently: Take 100 mg by mouth daily.)   omeprazole (PRILOSEC) 20 MG capsule Take 1 capsule (20 mg total) by mouth daily.   polyethylene glycol (MIRALAX / GLYCOLAX) 17 g packet Take 17 g by mouth every other day. (Patient not taking: Reported on 09/12/2022)   potassium chloride (KLOR-CON) 10 MEQ tablet TAKE 1 TABLET BY MOUTH EVERY DAY (Patient not taking: Reported on 11/06/2022)   pravastatin (PRAVACHOL) 20 MG tablet TAKE 1 TABLET BY MOUTH EVERY DAY (Patient not taking: Reported on 11/06/2022)  spironolactone (ALDACTONE) 25 MG tablet TAKE 1 TABLET (25 MG TOTAL) BY MOUTH DAILY. (Patient not taking: Reported on 11/06/2022)   No facility-administered encounter medications on file as of 11/08/2022.  Fill History:   Dispensed Days Supply Quantity Provider Pharmacy  AMITRIPTYLINE HCL 25 MG TAB 06/25/2022 90 90 each      Dispensed Days Supply Quantity Provider Pharmacy  AMLODIPINE BESYLATE 5 MG TAB 03/31/2021 90  [place], [name], [facility] [phone] - G...  AMLODIPINE BESYLATE 5 MG TAB 12/19/2020 90 90 each      Dispensed  Days Supply Quantity Provider Pharmacy  LOSARTAN 100MG  TAB 07/10/2022 90 90 tablet [place], [name], MD [facility] [phone] - G...  LOSARTAN POTASSIUM 100 MG TAB 04/11/2022 90 90 each      Dispensed Days Supply Quantity Provider Pharmacy  OMEPRAZOLE DR 20 MG CAPSULE 09/12/2022 90 90 each      Dispensed Days Supply Quantity Provider Pharmacy  POT CHLOR KC ER TAB 07/10/2022 90 90 tablet      Dispensed Days Supply Quantity Provider Pharmacy  PRAVASTATIN SODIUM 20 MG TAB 10/29/2022 90 90 each      Recent Office Vitals: BP Readings from Last 3 Encounters:  11/06/22 125/74  09/12/22 (!) 164/90  09/05/22 (!) 146/89   Pulse Readings from Last 3 Encounters:  11/06/22 74  09/12/22 84  09/05/22 84    Wt Readings from Last 3 Encounters:  11/06/22 245 lb (111.1 kg)  09/12/22 245 lb 6 oz (111.3 kg)  09/05/22 243 lb (110.2 kg)     Kidney Function Lab Results  Component Value Date/Time   CREATININE 0.87 07/27/2022 08:25 PM   CREATININE 0.97 05/30/2022 12:36 PM   CREATININE 1.03 (H) 05/03/2016 11:52 AM   CREATININE 0.93 10/31/2015 10:30 AM   GFR 61.88 08/01/2021 11:31 AM   GFRNONAA >60 07/27/2022 08:25 PM   GFRNONAA 58 (L) 05/03/2016 11:52 AM   GFRAA 68 04/19/2020 10:51 AM   GFRAA 67 05/03/2016 11:52 AM       Latest Ref Rng & Units 07/27/2022    8:25 PM 05/30/2022   12:36 PM 03/07/2022   12:49 AM  BMP  Glucose 70 - 99 mg/dL 502  92  774   BUN 8 - 23 mg/dL 16  13  12    Creatinine 0.44 - 1.00 mg/dL 1.28  7.86  7.67   BUN/Creat Ratio 12 - 28  13    Sodium 135 - 145 mmol/L 140  142  137   Potassium 3.5 - 5.1 mmol/L 3.8  3.7  3.8   Chloride 98 - 111 mmol/L 106  101  104   CO2 22 - 32 mmol/L 27  27  24    Calcium 8.9 - 10.3 mg/dL 9.5  8.9  8.9    Inetta Fermo [facility]  Clinical Pharmacist Assistant [phone]

## 2022-11-13 ENCOUNTER — Other Ambulatory Visit: Payer: Self-pay | Admitting: Physical Medicine and Rehabilitation

## 2022-11-20 ENCOUNTER — Encounter: Payer: Self-pay | Admitting: Gastroenterology

## 2022-12-04 NOTE — Progress Notes (Signed)
Subjective:    Patient ID: Jennifer Davila, female    DOB: May 20, 1956, 67 y.o.   MRN: [id number]  HPI  LOUREN Davila is a 67 y.o. year old female  who  has a past medical history of Arthritis, Constipation, GERD (gastroesophageal reflux disease), Hyperlipidemia, Hypertension, Kidney stones, Trouble swallowing (2013), and Urticaria.   They are presenting to PM&R clinic as a follow up for bilateral leg pain.   Plan from last visit: Leg cramping Assessment & Plan: RLS vs. Cramping in bilateral legs, feet at nighttime.    Neuropathy labs as above WNL.    Please take a vitamin B complex three times daily. Ensure this has at least 30 mg of vitamin B6 in the capsule.    Take iron supplement with 325 mg of iron every other day. If it upsets your stomach, you can decrease the dose until it's better tolerated.   I have prescribed tizanidine 2 mg as needed at bedtime. DO NOT take this until you have tried the supplements for at least 2 weeks. Only take it at bedtime due to risk of grogginess.    Follow up in 3 months. 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    Orders: -     Amb ref to Medical Nutrition Therapy-MNT   Interval Hx:  - Therapies: She says she is fine with activities; doing what she needs to do  without pain exacerbating too much. Stepping up/down steps in church has caused some instability/balance issues. Failed PT; still doing HEP and has lost 3 lbs   Pain: Has burning pain on the dorsums of her feet.   Also describes a pain in the back of her thighs; always present but worse with standing; different from foot pain, described as stabbing; does not radiate.   Edema is now persistent even at nighttime, even when legs elevated. Is using compression stockings.    - Medications: B6 supplements - have not been beneficial for cramping or burning.   Takes OTC tylenol.   Elavil - still taking at nighttime; helps her sleep but she wakes up with pain at nighttime.   Tizanidine -  has not been taking; never started.    - Other concerns: Husband is currently hospice; patient is his primary caregiver, needing to feed him. She says he has a nurse that comes out on Thursdays and another aide 2 days per week to help with baths, but otherwise it's just her. She says she does have local family that offers help, but usually doesn't follow through; she does have family that gives her a break on Sundays. She does endorse difficulty asking for help, she thinks she doesn't want to be a burden and wants to be involved in his care. Denies depression, anxiety.  She says she felt sick yesterday, but is doing better today.   Pain Inventory Average Pain 5 Pain Right Now 5 My pain is constant and burning  In the last 24 hours, has pain interfered with the following? General activity 10 Relation with others 10 Enjoyment of life 10 What TIME of day is your pain at its worst? morning , daytime, evening, and night Sleep (in general) Fair  Pain is worse with: some activites Pain improves with: medication Relief from Meds:  fair  Family History  Problem Relation Age of Onset   Hypertension Mother    Stroke Mother    Diabetes Father    Alcohol abuse Father    Kidney disease Father    Colon polyps  Brother    Cancer - Colon Brother    Alcohol abuse Brother    Heart disease Maternal Grandmother    Hypertension Son    Colon cancer Neg Hx    Esophageal cancer Neg Hx    Rectal cancer Neg Hx    Stomach cancer Neg Hx    Social History   Socioeconomic History   Marital status: Married    Spouse name: Not on file   Number of children: 1   Years of education: Not on file   Highest education level: Not on file  Occupational History   Occupation: caregiver  Tobacco Use   Smoking status: Former    Types: Cigarettes    Quit date: 10/02/1972    Years since quitting: 50.2   Smokeless tobacco: Never  Vaping Use   Vaping Use: Never used  Substance and Sexual Activity   Alcohol use: No   Drug use: No   Sexual activity: Never  Other Topics Concern   Not on file  Social History Narrative   Not on file   Social Determinants of Health   Financial Resource Strain: Low Risk  (02/19/2022)   Overall Financial Resource Strain (CARDIA)    Difficulty of Paying Living Expenses: Not hard at all  Food Insecurity: No Food Insecurity (02/19/2022)   Hunger Vital Sign    Worried About Running Out of Food in the Last Year: Never true    Ran Out of Food in the Last Year: Never true  Transportation Needs: No Transportation Needs (02/19/2022)   PRAPARE - Administrator, Civil Service (Medical): No    Lack of Transportation (Non-Medical): No  Physical Activity: Inactive (02/19/2022)   Exercise Vital Sign    Days of Exercise per Week: 0 days    Minutes of Exercise per Session: 0 min  Stress: No Stress Concern Present (02/19/2022)   [employer] of Occupational Health - Occupational Stress Questionnaire    Feeling of Stress : Not at all  Social Connections: Socially Integrated (02/19/2022)   Social Connection and Isolation Panel [NHANES]    Frequency of Communication with Friends and Family: More than three times a week    Frequency of Social Gatherings with Friends and Family: More  than three times a week    Attends Religious Services: More than 4 times per year    Active Member of [employer] or Organizations: Yes    Attends Engineer, structural: More than 4 times per year    Marital Status: Married   Past Surgical History:  Procedure Laterality Date   ABDOMINAL HYSTERECTOMY     BREAST BIOPSY Left 11/04/2019    BENIGN LYMPH NODE WITH PARACORTICAL HYPERPLASIA   CHOLECYSTECTOMY     COLONOSCOPY N/A  01/08/2018   Procedure: COLONOSCOPY;  Surgeon: Benancio Deeds, MD;  Location: [facility];  Service: Gastroenterology;  Laterality: N/A;   LAPAROSCOPIC GASTRIC BANDING     LAPAROSCOPIC REPAIR AND REMOVAL OF GASTRIC BAND  2013-2014   Past Surgical History:  Procedure Laterality Date   ABDOMINAL HYSTERECTOMY     BREAST BIOPSY Left 11/04/2019    BENIGN LYMPH NODE WITH PARACORTICAL HYPERPLASIA   CHOLECYSTECTOMY     COLONOSCOPY N/A 01/08/2018   Procedure: COLONOSCOPY;  Surgeon: Benancio Deeds, MD;  Location: [facility];  Service: Gastroenterology;  Laterality: N/A;   LAPAROSCOPIC GASTRIC BANDING     LAPAROSCOPIC REPAIR AND REMOVAL OF GASTRIC BAND  2013-2014   Past Medical History:  Diagnosis Date   Arthritis    Constipation    on stool softener   GERD (gastroesophageal reflux disease)    Hyperlipidemia    Hypertension    Kidney stones    Trouble swallowing 2013   following lap band   Urticaria    There were no vitals taken for this visit.  Opioid Risk Score:   Fall Risk Score:  `1  Depression screen PHQ 2/9     09/05/2022   11:01 AM 08/22/2022    2:15 PM 05/16/2022    2:30 PM 04/25/2022   10:54 AM 02/19/2022    2:21 PM 02/12/2022   10:51 AM 12/19/2020    2:07 PM  Depression screen PHQ 2/9  Decreased Interest 0 0 0 0 0 0 0  Down, Depressed, Hopeless  0 0 0 0 0 0  PHQ - 2 Score 0 0 0 0 0 0 0  Altered sleeping    1     Tired, decreased energy    1     Change in appetite    1     Feeling bad or failure about yourself     0     Trouble  concentrating    0     Moving slowly or fidgety/restless    0     Suicidal thoughts    0     PHQ-9 Score    3     Difficult doing work/chores    Not difficult at all       Review of Systems  Musculoskeletal:        Pain in both legs down to both feet  All other systems reviewed and are negative.     Objective:   Physical Exam    PE: Constitution: Appropriate appearance for age. No apparent distress +Obese Resp: No respiratory distress. No accessory muscle usage. on RA and CTAB Cardio: Well perfused appearance. 3+ bilateral peripheral pitting edema Abdomen: Nondistended. Nontender.   Psych: Appropriate mood and affect. Neuro: AAOx4. No apparent cognitive deficits   Neurologic Exam:   DTRs: Reflexes were 2+ in bilateral achilles, patella, biceps, BR and triceps. Babinsky: flexor responses b/l.   Hoffmans: negative b/l Sensory exam: revealed normal sensation in all dermatomal regions in bilateral upper extremities and bilateral lower extremities Motor exam: strength 5/5 throughout bilateral lower extremities Coordination: Fine motor coordination was normal.    Gait: Genu varum with occasional instability/wandering gait pattern, more often seen when bearing weight through the left leg. No apparent shuffling or weakness.         Assessment & Plan:   NAVAYAH ERICSON is a 67 y.o. year old female  who  has a past medical history of Arthritis, Constipation, GERD (gastroesophageal reflux disease), Hyperlipidemia, Hypertension, Kidney stones, Trouble  swallowing (2013), and Urticaria.   They are presenting to PM&R clinic as a follow up for treatment of bilateral LE pain and instability .    Leg cramping Start Robaxin 750 mg tablets as needed up to 4 times daily for posterior thigh pain and leg cramping  Hereditary and idiopathic peripheral neuropathy Stop Elavil 25 mg at nighttime and B6 supplementation, as these have not been helpful for you.  She has failed several  treatments: Hydrocodone,Tramadol,Gabapentin, lyrica and cymbalta.   Follow up with me for Qutenza to bilateral feet; provided information on this today. If beneficial, can repeat every 3-6 months.   Bilateral leg pain - posterior thighs Gait difficulty Perform home PT exercises at least 2-3x weekly to maintain stability  Use a quad cane when at church or doing activities that cause instability; I wrote a script for this today and discussed positioning at hip height and in opposite hand of unstable leg.   Cannot go back to PT due to caring for her husband currently  Bilateral lower extremity edema I will message your doctor about switching you off amlodipine Continue using compression socks and elevating legs at rest. Limit salt intake as well.   Morbid obesity Nutrition assessment was not covered by insurance Advised use of MyFitnessPal app for calorie tracking and weight goals for now

## 2022-12-05 ENCOUNTER — Encounter
Payer: No Typology Code available for payment source | Attending: Physical Medicine and Rehabilitation | Admitting: Physical Medicine and Rehabilitation

## 2022-12-05 ENCOUNTER — Encounter: Payer: Self-pay | Admitting: Physical Medicine and Rehabilitation

## 2022-12-05 VITALS — BP 159/92 | HR 82 | Ht 61.0 in | Wt 241.0 lb

## 2022-12-05 DIAGNOSIS — G609 Hereditary and idiopathic neuropathy, unspecified: Secondary | ICD-10-CM

## 2022-12-05 DIAGNOSIS — R269 Unspecified abnormalities of gait and mobility: Secondary | ICD-10-CM | POA: Diagnosis not present

## 2022-12-05 DIAGNOSIS — R600 Localized edema: Secondary | ICD-10-CM | POA: Diagnosis not present

## 2022-12-05 DIAGNOSIS — R252 Cramp and spasm: Secondary | ICD-10-CM | POA: Insufficient documentation

## 2022-12-05 DIAGNOSIS — M79605 Pain in left leg: Secondary | ICD-10-CM | POA: Insufficient documentation

## 2022-12-05 DIAGNOSIS — M79604 Pain in right leg: Secondary | ICD-10-CM | POA: Insufficient documentation

## 2022-12-05 MED ORDER — METHOCARBAMOL 750 MG PO TABS: 750.0000 mg | ORAL_TABLET | Freq: Four times a day (QID) | ORAL | 2 refills | Status: DC | PRN

## 2022-12-05 NOTE — Patient Instructions (Addendum)
  Leg cramping Start Robaxin 750 mg tablets as needed up to 4 times daily for posterior thigh pain and leg cramping  Hereditary and idiopathic peripheral neuropathy Stop Elavil 25 mg at nighttime and B6 supplementation, as these have not been helpful for you.  She has failed several treatments: Hydrocodone,Tramadol,Gabapentin, lyrica and cymbalta.   Follow up with me for Qutenza to bilateral feet; provided information on this today. If beneficial, can repeat every 3-6 months.   Bilateral leg pain - posterior thighs Gait difficulty Perform home PT exercises at least 2-3x weekly to maintain stability Use a quad cane when at church or doing activities that cause instability; I wrote a script for this today Cannot go back to PT due to caring for her husband currently  Bilateral lower extremity edema I will message your doctor about switching you off amlodipine Continue using compression socks and elevating legs at rest. Limit salt intake as well.

## 2022-12-10 ENCOUNTER — Telehealth: Payer: Self-pay

## 2022-12-10 DIAGNOSIS — G609 Hereditary and idiopathic neuropathy, unspecified: Secondary | ICD-10-CM

## 2022-12-10 MED ORDER — QUTENZA (4 PATCH) 8 % EX KIT: 4.0000 | PACK | Freq: Once | CUTANEOUS | 0 refills | Status: DC

## 2022-12-10 NOTE — Telephone Encounter (Signed)
Jennifer Davila (Key: [id number])

## 2023-01-02 ENCOUNTER — Encounter
Payer: No Typology Code available for payment source | Attending: Physical Medicine and Rehabilitation | Admitting: Physical Medicine and Rehabilitation

## 2023-01-02 ENCOUNTER — Telehealth: Payer: Self-pay

## 2023-01-02 VITALS — BP 173/96 | HR 84 | Ht 61.0 in | Wt 243.6 lb

## 2023-01-02 DIAGNOSIS — F4323 Adjustment disorder with mixed anxiety and depressed mood: Secondary | ICD-10-CM | POA: Insufficient documentation

## 2023-01-02 DIAGNOSIS — G609 Hereditary and idiopathic neuropathy, unspecified: Secondary | ICD-10-CM | POA: Diagnosis not present

## 2023-01-02 DIAGNOSIS — R252 Cramp and spasm: Secondary | ICD-10-CM | POA: Insufficient documentation

## 2023-01-02 DIAGNOSIS — M79605 Pain in left leg: Secondary | ICD-10-CM | POA: Diagnosis not present

## 2023-01-02 DIAGNOSIS — M79604 Pain in right leg: Secondary | ICD-10-CM | POA: Insufficient documentation

## 2023-01-02 MED ORDER — FLUOXETINE HCL 20 MG PO CAPS: 20.0000 mg | ORAL_CAPSULE | Freq: Every day | ORAL | 3 refills | Status: DC

## 2023-01-02 NOTE — Patient Instructions (Signed)
Bilateral leg pain Hereditary and idiopathic peripheral neuropathy Leg cramping After ensuring blood pressure systolic less than 170, diastolic less than 100, proceeded with Qutenza patch treatment today  Resume Usual Activities. Notify Physician of any unusual erythema or concern for side effects as reviewed above.  Follow up with me in 2 months to review effect  Adjustment disorder with mixed anxiety and depressed mood  Sent prescription for Prozac 20 mg daily.  Reviewed with patient risks for worsening depression and suicidality in the next few weeks; she will call clinic and stop medication if any concerns for this.  She should go to her primary care Dr. [place] for further evaluation and ongoing medication prescription, adjustments.  I will forward her today's note and let her know about this plan. She has an appointment in July already.

## 2023-01-02 NOTE — Progress Notes (Signed)
Care Management & Coordination Services Pharmacy Team  Reason for Encounter: Hypertension  Contacted patient to discuss hypertension disease state. Spoke with patient on 01/02/2023    Current antihypertensive regimen:  Amlodipine 5 mg 1/2 tablet daily pt states med discontinued with Dr Shearon Stalls Losartan 100 mg daily, patient doesn't recall when she filled this last Spironolactone 25 mg daily   How often are you checking your Blood Pressure? Patient states she has not been checking at home due to having to care for her husband, he is under hospice care.    What recent interventions/DTPs have been made by any provider to improve Blood Pressure control since last CPP Visit: No recent interventions  Any recent hospitalizations or ED visits since last visit with CPP? No recent hospital visits  What diet changes have been made to improve Blood Pressure Control?  Patient is not following any specific diet. Breakfast -  toast. Lunch - does not eat lunch Dinner - meat and vegetables with a salad Caffeine intake: coffee Salt intake: sometimes she adds salt  What exercise is being done to improve your Blood Pressure Control?  Patient states she does a lot of walking around the house and housework   Adherence Review: Is the patient currently on ACE/ARB medication? Yes Does the patient have >5 day gap between last estimated fill dates? Yes  Care Gaps: AWV - completed 02/19/2022 Last BP - 173/96 on 01/02/2023 Shingrix - never done Covid - overdue  Star Rating Drugs: Pravastatin 20 mg - last [facility] Losartan 100 mg - last filled [facility]/10/2022 [facility] verified with pharmacist  Chart Updates: Recent office visits:  None  Recent consult visits:  01/02/2023 Elijah Birk DO (phys med) - Patient was seen for bilateral leg pain and an additional concern. Started Fluoxetine 20 mg daily  12/05/2022 Elijah Birk DO (phys med) - Patient was seen for leg cramping and  additional concerns. Started Methocarbamol 750 mg q 6 hrs prn. Discontinued Amitriptyline.   Hospital visits:  None  Medications: Outpatient Encounter Medications as of 01/02/2023  Medication Sig   acetaminophen (TYLENOL) 500 MG tablet Take 1-2 tablets (500-1,000 mg total) by mouth daily as needed for moderate pain (pain).   amLODipine (NORVASC) 5 MG tablet Take 0.5 tablets (2.5 mg total) by mouth daily.   b complex vitamins capsule Take vitamin B complex three times daily, make sure the complex is containing at least 30 mg of vitamin B6   calcium carbonate (OSCAL) 1500 (600 Ca) MG TABS tablet Take 1 tablet by mouth 2 (two) times daily with a meal.   [START ON 01/02/2024] capsaicin topical system (QUTENZA, 4 PATCH,) 8 % Apply 4 patches topically once for 1 dose. Place on affected area of skin every 3 months as needed for neuropathy   Ferrous Sulfate (IRON) 325 (65 Fe) MG TABS Take one tablet every other day.   FLUoxetine (PROZAC) 20 MG capsule Take 1 capsule (20 mg total) by mouth daily.   losartan (COZAAR) 100 MG tablet TAKE 1 TABLET BY MOUTH EVERY DAY (Patient taking differently: Take 100 mg by mouth daily.)   methocarbamol (ROBAXIN-750) 750 MG tablet Take 1 tablet (750 mg total) by mouth every 6 (six) hours as needed for muscle spasms (posterior thigh pain).   omeprazole (PRILOSEC) 20 MG capsule Take 1 capsule (20 mg total) by mouth daily.   polyethylene glycol (MIRALAX / GLYCOLAX) 17 g packet Take 17 g by mouth every other day.  potassium chloride (KLOR-CON) 10 MEQ tablet TAKE 1 TABLET BY MOUTH EVERY DAY   pravastatin (PRAVACHOL) 20 MG tablet TAKE 1 TABLET BY MOUTH EVERY DAY   spironolactone (ALDACTONE) 25 MG tablet TAKE 1 TABLET (25 MG TOTAL) BY MOUTH DAILY.   No facility-administered encounter medications on file as of 01/02/2023.  Fill History:  Dispensed Days Supply Quantity Provider Pharmacy  AMLODIPINE BESYLATE 5 MG TAB 03/31/2021 90  [place], [name], MD [facility] [phone] - G...   AMLODIPINE BESYLATE 5 MG TAB 12/19/2020 90 90 each      Dispensed Days Supply Quantity Provider Pharmacy  LOSARTAN 100MG  TAB 07/10/2022 90 90 tablet      Dispensed Days Supply Quantity Provider Pharmacy  METHOCARBAMOL 750 MG TABLET 12/05/2022 26 90 each      Dispensed Days Supply Quantity Provider Pharmacy  OMEPRAZOLE DR 20 MG CAPSULE 12/09/2022 90 90 each      Dispensed Days Supply Quantity Provider Pharmacy  POT CHLOR KC ER TAB 07/10/2022 90 90 tablet      Dispensed Days Supply Quantity Provider Pharmacy  PRAVASTATIN SODIUM 20 MG TAB 10/29/2022 90 90 each      Dispensed Days Supply Quantity Provider Pharmacy  SPIRONOLACTONE 25 MG TABLET 11/13/2022 90 90 each     Recent Office Vitals: BP Readings from Last 3 Encounters:  01/02/23 (!) 173/96  12/05/22 (!) 159/92  11/06/22 125/74   Pulse Readings from Last 3 Encounters:  01/02/23 84  12/05/22 82  11/06/22 74    Wt Readings from Last 3 Encounters:  01/02/23 243 lb 9.6 oz (110.5 kg)  12/05/22 241 lb (109.3 kg)  11/06/22 245 lb (111.1 kg)     Kidney Function Lab Results  Component Value Date/Time   CREATININE 0.87 07/27/2022 08:25 PM   CREATININE 0.97 05/30/2022 12:36 PM   CREATININE 1.03 (H) 05/03/2016 11:52 AM   CREATININE 0.93 10/31/2015 10:30 AM   GFR 61.88 08/01/2021 11:31 AM   GFRNONAA >60 07/27/2022 08:25 PM   GFRNONAA 58 (L) 05/03/2016 11:52 AM   GFRAA 68 04/19/2020 10:51 AM   GFRAA 67 05/03/2016 11:52 AM       Latest Ref Rng & Units 07/27/2022    8:25 PM 05/30/2022   12:36 PM 03/07/2022   12:49 AM  BMP  Glucose 70 - 99 mg/dL 960  92  454   BUN 8 - 23 mg/dL 16  13  12    Creatinine 0.44 - 1.00 mg/dL 0.98  1.19  1.47   BUN/Creat Ratio 12 - 28  13    Sodium 135 - 145 mmol/L 140  142  137   Potassium 3.5 - 5.1 mmol/L 3.8  3.7  3.8   Chloride 98 - 111 mmol/L 106  101  104   CO2 22 - 32 mmol/L 27  27  24    Calcium 8.9 - 10.3 mg/dL 9.5  8.9  8.9    Inetta Fermo [facility]  Clinical Pharmacist  Assistant [phone]

## 2023-01-02 NOTE — Progress Notes (Signed)
HPI: Jennifer Davila is a 67 y.o. female with PMHx has Hip pain; Hypertension, essential, benign; Hyperlipidemia; Morbid obesity (HCC); Vertigo; Leg cramping; Vitamin D deficiency, unspecified; GERD (gastroesophageal reflux disease); Leg pain; Bilateral lower extremity edema; Lower GI bleed; Hypokalemia; Constipation; Diverticulosis of colon with hemorrhage; Hematochezia; Iron deficiency anemia; Chronic urticaria; Seasonal and perennial allergic rhinitis; Positive ANA (antinuclear antibody); Bilateral leg pain; Prediabetes; GI bleed; COVID-19 virus infection; Nephrolithiasis; Gait difficulty; Osteopenia; Hereditary and idiopathic peripheral neuropathy; Skin pruritus; and Dieting on their problem list. who presents to clinic for treatment of pain related to peripheral neuropathy with qutenza patches as described below.    Patient mentions again that she is the primary caregiver for her husband, who is disabled and intermittently lashing out at her.  She states "I thought about putting him in hospice a couple times, just to get a break."  She does not have support from her other family, and endorses over the past couple months being increasingly depressed, tearful, and lashing out.  She is asking today if there are any medications that can help her with this adjustment.;  She states it has been a long time since she is seeing Dr. Swaziland and has not discussed this with her yet.  We thoroughly discussed risks and benefits of starting a low-dose antidepressant, including short-term risk of increased suicidality, patient wishes to proceed with starting medication today and then having our office contact Dr. Swaziland for follow-up.  Physical Exam:  General: Appropriate appearance for age. +Obese.  Mental Status: Tearful affect, depressde mood. NO SI/HI. Cardiovascular: RRR, no m/r/g.  Respiratory: CTAB, no rales/rhonchi/wheezing.  Skin: No apparent rashes or lesions.  Neuro: Awake, alert, and oriented x3.  Sensory loss in stocking pattern in bilateral lower extremities.  MSK:  Moving all 4 limbs antigravity and against resistance.   PROCEDURE:  Qutenza Patch application Diagnosis:  No diagnosis found.  Goals with treatment: [ x ] Decrease pain [  ] Improve Active / Passive ROM [ x ] Improve ADLs [ x ] Improve functional mobility  MEDICATION:  [ x ] Qutenza 8% topical capsaicin - 4 patches   CONSENT: Obtained in writing per policy. Consent uploaded to chart.  Benefits discussed.  Risks discussed included, but were not limited to, pain and discomfort, bleeding, bruising, allergic reaction, infection. All questions answered to patient/family member/guardian/ caregiver satisfaction. They would like to proceed with procedure. There are no noted contraindications to procedure.  PROCEDURE Time out was preformed No heat sources No antibiotics  The patient was explained about both the benefits and risks of a capsaicin 8% patch application. After the patient acknowledged an understanding of the risks and benefits, the patient agreed to proceed.  2 patches of Qutenza were applied to the area of pain in each mid-shin and foot.  Blood pressure was monitored every 15 minutes. The patient tolerated the procedure well for *** 30 minutes until patches were removed and post-patch cream was applies.   No complications were encountered. The patient tolerated the procedure well.  Impression: HPI: Jennifer Davila is a 67 y.o. female with PMHx has Hip pain; Hypertension, essential, benign; Hyperlipidemia; Morbid obesity (HCC); Vertigo; Leg cramping; Vitamin D deficiency, unspecified; GERD (gastroesophageal reflux disease); Leg pain; Bilateral lower extremity edema; Lower GI bleed; Hypokalemia; Constipation; Diverticulosis of colon with hemorrhage; Hematochezia; Iron deficiency anemia; Chronic urticaria; Seasonal and perennial allergic rhinitis; Positive ANA (antinuclear antibody); Bilateral leg pain;  Prediabetes; GI bleed; COVID-19 virus infection; Nephrolithiasis; Gait difficulty; Osteopenia; Hereditary and  idiopathic peripheral neuropathy; Skin pruritus; and Dieting on their problem list. who presents to clinic for treatment of pain related to peripheral neuropathy with qutenza patches as described below.   No new concerns or complaints. No major changes in medical history since last visit.   PLAN:  Bilateral leg pain Hereditary and idiopathic peripheral neuropathy Leg cramping After ensuring blood pressure systolic less than 170, diastolic less than 100, proceeded with Qutenza patch treatment today  Resume Usual Activities. Notify Physician of any unusual erythema or concern for side effects as reviewed above.  Follow up with me in 2 months to review effect  Adjustment disorder with mixed anxiety and depressed mood  Sent prescription for Prozac 20 mg daily.  Reviewed with patient risks for worsening depression and suicidality in the next few weeks; she will call clinic and stop medication if any concerns for this.  She should go to her primary care Dr. Swaziland for further evaluation and ongoing medication prescription, adjustments.  I will forward her today's note and let her know about this plan. She has an appointment in July already.      Patient/Care [name] was ready to learn without apparent learning barriers. Education was provided on diagnosis, treatment options/plan according to patient's preferred learning style. Patient/Care Giver verbalized understanding and agreement with the above plan.   [name], DO 01/02/2023

## 2023-01-15 ENCOUNTER — Other Ambulatory Visit: Payer: Self-pay | Admitting: Family Medicine

## 2023-01-15 DIAGNOSIS — I10 Essential (primary) hypertension: Secondary | ICD-10-CM

## 2023-02-10 ENCOUNTER — Other Ambulatory Visit: Payer: Self-pay | Admitting: Family Medicine

## 2023-02-19 NOTE — Progress Notes (Unsigned)
HPI: [name] is a 67 y.o. female, who is here today for chronic disease management.  Last seen on 08/22/22.  Hypertension: Currently she is on losartan 100 mg daily and spironolactone 25 mg daily,. Sh eis not longer on Amlodipine. She does not monitor BP at home. Negative for unusual or severe headache, visual changes, exertional chest pain, dyspnea,  focal weakness, or worsening edema. Wearing compression stocking.  Hypokalemia she is not longer on K-Lor 10 mK daily.  Lab Results  Component Value Date   NA 140 07/27/2022   K 3.8 07/27/2022   CO2 27 07/27/2022   GLUCOSE 100 (H) 07/27/2022   BUN 16 07/27/2022   CREATININE 0.87 07/27/2022   CALCIUM 9.5 07/27/2022   GFR 61.88 08/01/2021   EGFR 64 05/30/2022   GFRNONAA >60 07/27/2022   Since her last visit she has [facility]. She has been treated with Qutenza.  Her husband died a few days ago, he was under hospice care. She has been prescribed Fluoxetine 20 mg but she was afraid of potential side effects and did not start medication. Having difficulty staying asleep.  Review of Systems  Constitutional:  Positive for fatigue. Negative for chills and fever.  HENT:  Negative for mouth sores and sore throat.   Respiratory:  Negative for cough and wheezing.   Gastrointestinal:  Negative for abdominal pain, nausea and vomiting.  Genitourinary:  Negative for decreased urine volume, dysuria and hematuria.  Skin:  Negative for rash.  Neurological:  Negative for syncope and facial asymmetry.  See other pertinent positives and negatives in HPI.  Current Outpatient Medications on File Prior to Visit  Medication Sig Dispense Refill   acetaminophen (TYLENOL) 500 MG tablet Take 1-2 tablets (500-1,000 mg total) by mouth daily as needed for moderate pain (pain). 60 tablet 1   b complex vitamins capsule Take vitamin B complex three times daily, make sure the  complex is containing at least 30 mg of vitamin B6 80 capsule 6   calcium carbonate (OSCAL) 1500 (600 Ca) MG TABS tablet Take 1 tablet by mouth 2 (two) times daily with a meal.     [START ON 01/02/2024] capsaicin topical system (QUTENZA, 4 PATCH,) 8 % Apply 4 patches topically once for 1 dose. Place on affected area of skin every 3 months as needed for neuropathy 4 patch 0   Ferrous Sulfate (IRON) 325 (65 Fe) MG TABS Take one tablet every other day. 15 tablet 0   FLUoxetine (PROZAC) 20 MG capsule Take 1 capsule (20 mg total) by mouth daily. 90 capsule 3   losartan (COZAAR) 100 MG tablet TAKE 1 TABLET BY MOUTH EVERY DAY 90 tablet 2   methocarbamol (ROBAXIN-750) 750 MG tablet Take 1 tablet (750 mg total) by mouth every 6 (six) hours as needed for muscle spasms (posterior thigh pain). 90 tablet 2   omeprazole (PRILOSEC) 20 MG capsule Take 1 capsule (20 mg total) by mouth daily. 90 capsule 3   polyethylene glycol (MIRALAX / GLYCOLAX) 17 g packet Take 17 g by mouth every other day.     pravastatin (PRAVACHOL) 20 MG tablet TAKE 1 TABLET BY MOUTH EVERY DAY 90 tablet 3   spironolactone (ALDACTONE) 25 MG tablet TAKE 1 TABLET (25 MG TOTAL) BY MOUTH DAILY. 90 tablet 1   No current facility-administered medications on file prior to visit.    Past Medical History:  Diagnosis Date   Arthritis  Constipation    on stool softener   GERD (gastroesophageal reflux disease)    Hyperlipidemia    Hypertension    Kidney stones    Trouble swallowing 2013   following lap band   Urticaria    No Known Allergies  Social History   Socioeconomic History   Marital status: Married    Spouse name: Not on file   Number of children: 1   Years of education: Not on file   Highest education level: Not on file  Occupational History   Occupation: caregiver  Tobacco Use   Smoking status: Former    Current packs/day: 0.00    Types: Cigarettes    Quit date: 10/02/1972    Years since quitting: 50.4   Smokeless  tobacco: Never  Vaping Use   Vaping status: Never Used  Substance and Sexual Activity   Alcohol use: No   Drug use: No   Sexual activity: Never  Other Topics Concern   Not on file  Social History Narrative   Not on file   Social Determinants of Health   Financial Resource Strain: Low Risk  (02/19/2022)   Overall Financial Resource Strain (CARDIA)    Difficulty of Paying Living Expenses: Not hard at all  Food Insecurity: No Food Insecurity (02/19/2022)   Hunger Vital Sign    Worried About Running Out of Food in the Last Year: Never true    Ran Out of Food in the Last Year: Never true  Transportation Needs: No Transportation Needs (02/19/2022)   PRAPARE - Administrator, Civil Service (Medical): No    Lack of Transportation (Non-Medical): No  Physical Activity: Inactive (02/19/2022)   Exercise Vital Sign    Days of Exercise per Week: 0 days    Minutes of Exercise per Session: 0 min  Stress: No Stress Concern Present (02/19/2022)   [employer] of Occupational Health - Occupational Stress Questionnaire    Feeling of Stress : Not at all  Social Connections: Socially Integrated (02/19/2022)   Social Connection and Isolation Panel [NHANES]    Frequency of Communication with Friends and Family: More than three times a week    Frequency of Social Gatherings with Friends and Family: More than three times a week    Attends Religious Services: More than 4 times per year    Active Member of [employer] or Organizations: Yes    Attends Banker Meetings: More than 4 times per year    Marital Status: Married    Vitals:   02/20/23 1344  BP: 136/80  Pulse: 85  Resp: 16  Temp: 98.7 F (37.1 C)  SpO2: 95%   Body mass index is 46.48 kg/m.  Physical Exam Vitals and nursing note reviewed.  Constitutional:      General: She is not in acute distress.    Appearance: She is well-developed.  HENT:     Head: Normocephalic and atraumatic.     Mouth/Throat:     Mouth:  Mucous membranes are moist.     Pharynx: Oropharynx is clear.  Eyes:     Conjunctiva/sclera: Conjunctivae normal.  Cardiovascular:     Rate and Rhythm: Normal rate and regular rhythm.     Heart sounds: No murmur heard.    Comments: Trace pitting LE edema,bilateral. Compression stockings on. DP pulses palpable. Pulmonary:     Effort: Pulmonary effort is normal. No respiratory distress.     Breath sounds: Normal breath sounds.  Abdominal:     Palpations:  Abdomen is soft. There is no mass.     Tenderness: There is no abdominal tenderness.  Lymphadenopathy:     Cervical: No cervical adenopathy.  Skin:    General: Skin is warm.     Findings: No erythema or rash.  Neurological:     General: No focal deficit present.     Mental Status: She is alert and oriented to person, place, and time.     Cranial Nerves: No cranial nerve deficit.     Gait: Gait normal.  Psychiatric:        Mood and Affect: Mood and affect normal.   ASSESSMENT AND PLAN:  Ms. Jennifer "[name]" was seen today for medical management of chronic issues and grief.  Diagnoses and all orders for this visit: Lab Results  Component Value Date   NA 137 02/20/2023   CL 101 02/20/2023   K 4.0 02/20/2023   CO2 28 02/20/2023   BUN 15 02/20/2023   CREATININE 1.08 02/20/2023   GFR 52.48 (L) 02/20/2023   CALCIUM 9.3 02/20/2023   ALBUMIN 4.1 07/27/2022   GLUCOSE 101 (H) 02/20/2023   Adjustment insomnia Assessment & Plan: Difficulty sleeping after her husband's death. Recommend trying Trazodone 25-50 mg 30-60 min before bedtime. Some side effects discussed. Good sleep hygiene to continue. Instructed to let me know if medication helps.  Orders: -     traZODone HCl; Take 0.5-1 tablets (25-50 mg total) by mouth at bedtime.  Dispense: 30 tablet; Refill: 2  Bilateral lower extremity edema Assessment & Plan: She reports no significant improvement after discontinuing Amlodipine. Continue compression stockings and LE  elevation as well as appropriate skin care.  Orders: -     Basic metabolic panel; Future  Hypertension, essential, benign Assessment & Plan: High normal BP today. Continue Losartan 100 mg daily, and spironolactone 25 mg daily. Recommend monitoring BP at home. Continue low-salt/DASH diet.  Orders: -     Basic metabolic panel; Future  Hypokalemia Assessment & Plan: She stop KLOR 10 meq. Continue potassium rich diet. Further recommendations according to lab result.   Return in about 5 months (around 07/23/2023) for chronic problems.  Oletha Tolson G. Swazil[place], MD  [facility]. [place] office.

## 2023-02-19 NOTE — Patient Instructions (Incomplete)
A few things to remember from today's visit:  Hypertension, essential, benign  Hyperlipidemia, unspecified hyperlipidemia type  If you need refills for medications you take chronically, please call your pharmacy. Do not use My Chart to request refills or for acute issues that need immediate attention. If you send a my chart message, it may take a few days to be addressed, specially if I am not in the office.  Please be sure medication list is accurate. If a new problem present, please set up appointment sooner than planned today.

## 2023-02-20 ENCOUNTER — Encounter: Payer: Self-pay | Admitting: Family Medicine

## 2023-02-20 ENCOUNTER — Ambulatory Visit (INDEPENDENT_AMBULATORY_CARE_PROVIDER_SITE_OTHER): Payer: No Typology Code available for payment source | Admitting: Family Medicine

## 2023-02-20 VITALS — BP 136/80 | HR 85 | Temp 98.7°F | Resp 16 | Ht 61.0 in | Wt 246.0 lb

## 2023-02-20 DIAGNOSIS — I10 Essential (primary) hypertension: Secondary | ICD-10-CM

## 2023-02-20 DIAGNOSIS — E876 Hypokalemia: Secondary | ICD-10-CM | POA: Diagnosis not present

## 2023-02-20 DIAGNOSIS — F5102 Adjustment insomnia: Secondary | ICD-10-CM

## 2023-02-20 DIAGNOSIS — E785 Hyperlipidemia, unspecified: Secondary | ICD-10-CM

## 2023-02-20 DIAGNOSIS — R600 Localized edema: Secondary | ICD-10-CM | POA: Diagnosis not present

## 2023-02-20 LAB — BASIC METABOLIC PANEL
BUN: 15 mg/dL (ref 6–23)
CO2: 28 mEq/L (ref 19–32)
Calcium: 9.3 mg/dL (ref 8.4–10.5)
Chloride: 101 mEq/L (ref 96–112)
Creatinine, Ser: 1.08 mg/dL (ref 0.40–1.20)
GFR: 52.48 mL/min — ABNORMAL LOW (ref >60.00)
Glucose, Bld: 101 mg/dL — ABNORMAL HIGH (ref 70–99)
Potassium: 4 mEq/L (ref 3.5–5.1)
Sodium: 137 mEq/L (ref 135–145)

## 2023-02-20 MED ORDER — TRAZODONE HCL 50 MG PO TABS: 25.0000 mg | ORAL_TABLET | Freq: Every day | ORAL | 2 refills | Status: DC

## 2023-02-20 NOTE — Assessment & Plan Note (Signed)
She reports no significant improvement after discontinuing Amlodipine. Continue compression stockings and LE elevation as well as appropriate skin care.

## 2023-02-20 NOTE — Assessment & Plan Note (Signed)
She stop KLOR 10 meq. Continue potassium rich diet. Further recommendations according to lab result.

## 2023-02-20 NOTE — Assessment & Plan Note (Signed)
High normal BP today. Continue Losartan 100 mg daily, and spironolactone 25 mg daily. Recommend monitoring BP at home. Continue low-salt/DASH diet.

## 2023-02-20 NOTE — Assessment & Plan Note (Addendum)
Difficulty sleeping after her husband's death. Recommend trying Trazodone 25-50 mg 30-60 min before bedtime. Some side effects discussed. Good sleep hygiene to continue. Instructed to let me know if medication helps.

## 2023-02-22 ENCOUNTER — Ambulatory Visit (INDEPENDENT_AMBULATORY_CARE_PROVIDER_SITE_OTHER): Payer: No Typology Code available for payment source

## 2023-02-22 VITALS — Ht 61.0 in | Wt 246.0 lb

## 2023-02-22 DIAGNOSIS — Z0000 Encounter for general adult medical examination without abnormal findings: Secondary | ICD-10-CM | POA: Diagnosis not present

## 2023-02-22 NOTE — Progress Notes (Signed)
Subjective:   Jennifer Davila is a 67 y.o. female who presents for Medicare Annual (Subsequent) preventive examination.  Visit Complete: Virtual  I connected with  Jennifer Davila on 02/22/23 by a audio enabled telemedicine application and verified that I am speaking with the correct person using two identifiers.  Patient Location: Home  Provider Location: Home Office  I discussed the limitations of evaluation and management by telemedicine. The patient expressed understanding and agreed to proceed.  Patient Medicare AWV questionnaire was completed by the patient on ; I have confirmed that all information answered by patient is correct and no changes since this date.  Review of Systems    Vital Signs: Unable to obtain new vitals due to this being a telehealth visit.  Cardiac Risk Factors include: advanced age (>49men, >77 women);hypertension     Objective:    Today's Vitals   02/22/23 1408  Weight: 246 lb (111.6 kg)  Height: 5\' 1"  (1.549 m)   Body mass index is 46.48 kg/m.     02/22/2023    2:15 PM 07/27/2022    6:22 PM 05/11/2022    9:46 AM 03/06/2022    7:50 PM 02/19/2022    2:25 PM 07/18/2021    8:14 PM 07/18/2021    1:15 PM  Advanced Directives  Does Patient Have a Medical Advance Directive? No No No No No No No  Would patient like information on creating a medical advance directive? No - Patient declined  No - Patient declined No - Patient declined No - Patient declined No - Patient declined     Current Medications (verified) Outpatient Encounter Medications as of 02/22/2023  Medication Sig   acetaminophen (TYLENOL) 500 MG tablet Take 1-2 tablets (500-1,000 mg total) by mouth daily as needed for moderate pain (pain).   b complex vitamins capsule Take vitamin B complex three times daily, make sure the complex is containing at least 30 mg of vitamin B6   calcium carbonate (OSCAL) 1500 (600 Ca) MG TABS tablet Take 1 tablet by mouth 2 (two) times daily with a  meal.   [START ON 01/02/2024] capsaicin topical system (QUTENZA, 4 PATCH,) 8 % Apply 4 patches topically once for 1 dose. Place on affected area of skin every 3 months as needed for neuropathy   Ferrous Sulfate (IRON) 325 (65 Fe) MG TABS Take one tablet every other day.   FLUoxetine (PROZAC) 20 MG capsule Take 1 capsule (20 mg total) by mouth daily.   losartan (COZAAR) 100 MG tablet TAKE 1 TABLET BY MOUTH EVERY DAY   methocarbamol (ROBAXIN-750) 750 MG tablet Take 1 tablet (750 mg total) by mouth every 6 (six) hours as needed for muscle spasms (posterior thigh pain).   omeprazole (PRILOSEC) 20 MG capsule Take 1 capsule (20 mg total) by mouth daily.   polyethylene glycol (MIRALAX / GLYCOLAX) 17 g packet Take 17 g by mouth every other day.   pravastatin (PRAVACHOL) 20 MG tablet TAKE 1 TABLET BY MOUTH EVERY DAY   spironolactone (ALDACTONE) 25 MG tablet TAKE 1 TABLET (25 MG TOTAL) BY MOUTH DAILY.   traZODone (DESYREL) 50 MG tablet Take 0.5-1 tablets (25-50 mg total) by mouth at bedtime.   No facility-administered encounter medications on file as of 02/22/2023.    Allergies (verified) Patient has no known allergies.   History: Past Medical History:  Diagnosis Date   Arthritis    Constipation    on stool softener   GERD (gastroesophageal reflux disease)    Hyperlipidemia  Hypertension    Kidney stones    Trouble swallowing 2013   following lap band   Urticaria    Past Surgical History:  Procedure Laterality Date   ABDOMINAL HYSTERECTOMY     BREAST BIOPSY Left 11/04/2019    BENIGN LYMPH NODE WITH PARACORTICAL HYPERPLASIA   CHOLECYSTECTOMY     COLONOSCOPY N/A 01/08/2018   Procedure: COLONOSCOPY;  Surgeon: Benancio Deeds, MD;  Location: [facility];  Service: Gastroenterology;  Laterality: N/A;   LAPAROSCOPIC GASTRIC BANDING     LAPAROSCOPIC REPAIR AND REMOVAL OF GASTRIC BAND  2013-2014   Family History  Problem Relation Age of Onset   Hypertension Mother    Stroke Mother     Diabetes Father    Alcohol abuse Father    Kidney disease Father    Colon polyps Brother    Cancer - Colon Brother    Alcohol abuse Brother    Heart disease Maternal Grandmother    Hypertension Son    Colon cancer Neg Hx    Esophageal cancer Neg Hx    Rectal cancer Neg Hx    Stomach cancer Neg Hx    Social History   Socioeconomic History   Marital status: Married    Spouse name: Not on file   Number of children: 1   Years of education: Not on file   Highest education level: Not on file  Occupational History   Occupation: caregiver  Tobacco Use   Smoking status: Former    Current packs/day: 0.00    Types: Cigarettes    Quit date: 10/02/1972    Years since quitting: 50.4   Smokeless tobacco: Never  Vaping Use   Vaping status: Never Used  Substance and Sexual Activity   Alcohol use: No   Drug use: No   Sexual activity: Never  Other Topics Concern   Not on file  Social History Narrative   Not on file   Social Determinants of Health   Financial Resource Strain: Low Risk  (02/22/2023)   Overall Financial Resource Strain (CARDIA)    Difficulty of Paying Living Expenses: Not hard at all  Food Insecurity: No Food Insecurity (02/22/2023)   Hunger Vital Sign    Worried About Running Out of Food in the Last Year: Never true    Ran Out of Food in the Last Year: Never true  Transportation Needs: No Transportation Needs (02/22/2023)   PRAPARE - Administrator, Civil Service (Medical): No    Lack of Transportation (Non-Medical): No  Physical Activity: Inactive (02/22/2023)   Exercise Vital Sign    Days of Exercise per Week: 0 days    Minutes of Exercise per Session: 0 min  Stress: No Stress Concern Present (02/22/2023)   [employer] of Occupational Health - Occupational Stress Questionnaire    Feeling of Stress : Not at all  Social Connections: Moderately Integrated (02/22/2023)   Social Connection and Isolation Panel [NHANES]    Frequency of Communication with  Friends and Family: More than three times a week    Frequency of Social Gatherings with Friends and Family: More than three times a week    Attends Religious Services: More than 4 times per year    Active Member of [employer] or Organizations: Yes    Attends Banker Meetings: More than 4 times per year    Marital Status: Widowed    Tobacco Counseling Counseling given: Not Answered   Clinical Intake:  Pre-visit preparation completed: No  Pain :  No/denies pain     BMI - recorded: 46.48 Nutritional Status: BMI > 30  Obese Nutritional Risks: None Diabetes: No  How often do you need to have someone help you when you read instructions, pamphlets, or other written materials from your doctor or pharmacy?: 1 - Never  Interpreter Needed?: No  Information entered by :: Theresa Mulligan LPN   Activities of Daily Living    02/22/2023    2:14 PM  In your present state of health, do you have any difficulty performing the following activities:  Hearing? 0  Vision? 0  Difficulty concentrating or making decisions? 0  Walking or climbing stairs? 0  Dressing or bathing? 0  Doing errands, shopping? 0  Preparing Food and eating ? N  Using the Toilet? N  In the past six months, have you accidently leaked urine? N  Do you have problems with loss of bowel control? N  Managing your Medications? N  Managing your Finances? N  Housekeeping or managing your Housekeeping? N    Patient Care Team: [place], [name], MD as PCP - General (Family Medicine) Verner Chol, [facility] (Inactive) as Pharmacist (Pharmacist)  Indicate any recent Medical Services you may have received from other than [facility] providers in the past year (date may be approximate).     Assessment:   This is a routine wellness examination for [place].  Hearing/Vision screen Hearing Screening - Comments:: Denies hearing difficulties   Vision Screening - Comments:: Wears rx glasses - Not up to date with routine eye  exams with  Patient deferred  Dietary issues and exercise activities discussed:     Goals Addressed               This Visit's Progress     Stay Healthy (pt-stated)         Depression Screen    02/22/2023    2:12 PM 12/05/2022   10:47 AM 09/05/2022   11:01 AM 08/22/2022    2:15 PM 05/16/2022    2:30 PM 04/25/2022   10:54 AM 02/19/2022    2:21 PM  PHQ 2/9 Scores  PHQ - 2 Score 0 0 0 0 0 0 0  PHQ- 9 Score      3     Fall Risk    02/22/2023    2:14 PM 12/05/2022   10:47 AM 09/05/2022   11:01 AM 08/22/2022    2:15 PM 05/16/2022    2:30 PM  Fall Risk   Falls in the past year? 0 0 0 0 0  Number falls in past yr: 0 0 0 0 0  Injury with Fall? 0 0 0 0 0  Risk for fall due to : No Fall Risks   No Fall Risks   Follow up Falls prevention discussed   Falls evaluation completed     MEDICARE RISK AT HOME:  Medicare Risk at Home - 02/22/23 1417     Any stairs in or around the home? Yes    If so, are there any without handrails? No    Home free of loose throw rugs in walkways, pet beds, electrical cords, etc? Yes    Adequate lighting in your home to reduce risk of falls? Yes    Life alert? No    Use of a cane, walker or w/c? No    Grab bars in the bathroom? Yes    Shower chair or bench in shower? No    Elevated toilet seat or a handicapped toilet? No  TIMED UP AND GO:  Was the test performed?  No    Cognitive Function:        02/22/2023    2:15 PM 02/19/2022    2:25 PM  6CIT Screen  What Year? 0 points 0 points  What month? 0 points 0 points  What time? 0 points 0 points  Count back from 20 0 points 0 points  Months in reverse 4 points 0 points  Repeat phrase 0 points 0 points  Total Score 4 points 0 points    Immunizations Immunization History  Administered Date(s) Administered   Fluad Quad(high Dose 65+) 06/15/2020, 05/30/2021, 08/22/2022   Hepatitis B, ADULT 10/13/2019, 11/17/2019, 05/18/2020   Influenza, High Dose Seasonal PF 04/29/2019    Influenza,inj,Quad PF,6+ Mos 05/23/2015, 05/03/2016, 04/23/2018   Moderna Sars-Covid-2 Vaccination 01/08/2020, 02/05/2020   PPD Test 05/14/2018   Pneumococcal Conjugate-13 08/04/2015   Pneumococcal Polysaccharide-23 08/26/2019   Tdap 05/23/2015    TDAP status: Up to date  Flu Vaccine status: Up to date  Pneumococcal vaccine status: Up to date  Covid-19 vaccine status: Completed vaccines  Qualifies for Shingles Vaccine? Yes   Zostavax completed No   Shingrix Completed?: No.    Education has been provided regarding the importance of this vaccine. Patient has been advised to call insurance company to determine out of pocket expense if they have not yet received this vaccine. Advised may also receive vaccine at local pharmacy or Health Dept. Verbalized acceptance and understanding.  Screening Tests Health Maintenance  Topic Date Due   Zoster Vaccines- Shingrix (1 of 2) Never done   COVID-19 Vaccine (3 - Moderna risk series) 03/04/2020   INFLUENZA VACCINE  02/28/2023   MAMMOGRAM  05/10/2023   Medicare Annual Wellness (AWV)  02/22/2024   DTaP/Tdap/Td (2 - Td or Tdap) 05/22/2025   Colonoscopy  11/05/2029   Pneumonia Vaccine 30+ Years old  Completed   DEXA SCAN  Completed   Hepatitis C Screening  Completed   HPV VACCINES  Aged Out    Health Maintenance  Health Maintenance Due  Topic Date Due   Zoster Vaccines- Shingrix (1 of 2) Never done   COVID-19 Vaccine (3 - Moderna risk series) 03/04/2020    Colorectal cancer screening: Type of screening: Colonoscopy. Completed 11/06/22. Repeat every 7 years  Mammogram status: Completed 05/09/22. Repeat every year  Bone Density status: Completed 05/09/22. Results reflect: Bone density results: OSTEOPENIA. Repeat every   years.  Lung Cancer Screening: (Low Dose CT Chest recommended if Age 37-80 years, 20 pack-year currently smoking OR have quit w/in 15years.)  qualify.     Additional Screening:  Hepatitis C Screening: does qualify;  Completed 10/31/15  Vision Screening: Recommended annual ophthalmology exams for early detection of glaucoma and other disorders of the eye. Is the patient up to date with their annual eye exam?  No  Who is the provider or what is the name of the office in which the patient attends annual eye exams? Patient deferred If pt is not established with a provider, would they like to be referred to a provider to establish care? No .   Dental Screening: Recommended annual dental exams for proper oral hygiene    Community Resource Referral / Chronic Care Management:  CRR required this visit?  No   CCM required this visit?  No     Plan:     I have personally reviewed and noted the following in the patient's chart:   Medical and social history Use of  alcohol, tobacco or illicit drugs  Current medications and supplements including opioid prescriptions. Patient is not currently taking opioid prescriptions. Functional ability and status Nutritional status Physical activity Advanced directives List of other physicians Hospitalizations, surgeries, and ER visits in previous 12 months Vitals Screenings to include cognitive, depression, and falls Referrals and appointments  In addition, I have reviewed and discussed with patient certain preventive protocols, quality metrics, and best practice recommendations. A written personalized care plan for preventive services as well as general preventive health recommendations were provided to patient.     Tillie Rung, LPN   [id number]   After Visit Summary: (MyChart) Due to this being a telephonic visit, the after visit summary with patients personalized plan was offered to patient via MyChart   Nurse Notes: None

## 2023-02-22 NOTE — Patient Instructions (Addendum)
Jennifer Davila , Thank you for taking time to come for your Medicare Wellness Visit. I appreciate your ongoing commitment to your health goals. Please review the following plan we discussed and let me know if I can assist you in the future.   Referrals/Orders/Follow-Ups/Clinician Recommendations:   This is a list of the screening recommended for you and due dates:  Health Maintenance  Topic Date Due   Zoster (Shingles) Vaccine (1 of 2) Never done   COVID-19 Vaccine (3 - Moderna risk series) 03/04/2020   Flu Shot  02/28/2023   Mammogram  05/10/2023   Medicare Annual Wellness Visit  02/22/2024   DTaP/Tdap/Td vaccine (2 - Td or Tdap) 05/22/2025   Colon Cancer Screening  11/05/2029   Pneumonia Vaccine  Completed   DEXA scan (bone density measurement)  Completed   Hepatitis C Screening  Completed   HPV Vaccine  Aged Out    Advanced directives: (Declined) Advance directive discussed with you today. Even though you declined this today, please call our office should you change your mind, and we can give you the proper paperwork for you to fill out.  Next Medicare Annual Wellness Visit scheduled for next year: Yes  Preventive Care 17 Years and Older, Female Preventive care refers to lifestyle choices and visits with your health care provider that can promote health and wellness. What does preventive care include? A yearly physical exam. This is also called an annual well check. Dental exams once or twice a year. Routine eye exams. Ask your health care provider how often you should have your eyes checked. Personal lifestyle choices, including: Daily care of your teeth and gums. Regular physical activity. Eating a healthy diet. Avoiding tobacco and drug use. Limiting alcohol use. Practicing safe sex. Taking low-dose aspirin every day. Taking vitamin and mineral supplements as recommended by your health care provider. What happens during an annual well check? The services and screenings done  by your health care provider during your annual well check will depend on your age, overall health, lifestyle risk factors, and family history of disease. Counseling  Your health care provider may ask you questions about your: Alcohol use. Tobacco use. Drug use. Emotional well-being. Home and relationship well-being. Sexual activity. Eating habits. History of falls. Memory and ability to understand (cognition). Work and work Astronomer. Reproductive health. Screening  You may have the following tests or measurements: Height, weight, and BMI. Blood pressure. Lipid and cholesterol levels. These may be checked every 5 years, or more frequently if you are over 72 years old. Skin check. Lung cancer screening. You may have this screening every year starting at age 21 if you have a 30-pack-year history of smoking and currently smoke or have quit within the past 15 years. Fecal occult blood test (FOBT) of the stool. You may have this test every year starting at age 78. Flexible sigmoidoscopy or colonoscopy. You may have a sigmoidoscopy every 5 years or a colonoscopy every 10 years starting at age 15. Hepatitis C blood test. Hepatitis B blood test. Sexually transmitted disease (STD) testing. Diabetes screening. This is done by checking your blood sugar (glucose) after you have not eaten for a while (fasting). You may have this done every 1-3 years. Bone density scan. This is done to screen for osteoporosis. You may have this done starting at age 31. Mammogram. This may be done every 1-2 years. Talk to your health care provider about how often you should have regular mammograms. Talk with your health care provider  about your test results, treatment options, and if necessary, the need for more tests. Vaccines  Your health care provider may recommend certain vaccines, such as: Influenza vaccine. This is recommended every year. Tetanus, diphtheria, and acellular pertussis (Tdap, Td) vaccine. You  may need a Td booster every 10 years. Zoster vaccine. You may need this after age 30. Pneumococcal 13-valent conjugate (PCV13) vaccine. One dose is recommended after age 72. Pneumococcal polysaccharide (PPSV23) vaccine. One dose is recommended after age 107. Talk to your health care provider about which screenings and vaccines you need and how often you need them. This information is not intended to replace advice given to you by your health care provider. Make sure you discuss any questions you have with your health care provider. Document Released: 08/12/2015 Document Revised: 04/04/2016 Document Reviewed: 05/17/2015 Elsevier Interactive Patient Education  2017 [employer].  Fall Prevention in the Home Falls can cause injuries. They can happen to people of all ages. There are many things you can do to make your home safe and to help prevent falls. What can I do on the outside of my home? Regularly fix the edges of walkways and driveways and fix any cracks. Remove anything that might make you trip as you walk through a door, such as a raised step or threshold. Trim any bushes or trees on the path to your home. Use bright outdoor lighting. Clear any walking paths of anything that might make someone trip, such as rocks or tools. Regularly check to see if handrails are loose or broken. Make sure that both sides of any steps have handrails. Any raised decks and porches should have guardrails on the edges. Have any leaves, snow, or ice cleared regularly. Use sand or salt on walking paths during winter. Clean up any spills in your garage right away. This includes oil or grease spills. What can I do in the bathroom? Use night lights. Install grab bars by the toilet and in the tub and shower. Do not use towel bars as grab bars. Use non-skid mats or decals in the tub or shower. If you need to sit down in the shower, use a plastic, non-slip stool. Keep the floor dry. Clean up any water that spills  on the floor as soon as it happens. Remove soap buildup in the tub or shower regularly. Attach bath mats securely with double-sided non-slip rug tape. Do not have throw rugs and other things on the floor that can make you trip. What can I do in the bedroom? Use night lights. Make sure that you have a light by your bed that is easy to reach. Do not use any sheets or blankets that are too big for your bed. They should not hang down onto the floor. Have a firm chair that has side arms. You can use this for support while you get dressed. Do not have throw rugs and other things on the floor that can make you trip. What can I do in the kitchen? Clean up any spills right away. Avoid walking on wet floors. Keep items that you use a lot in easy-to-reach places. If you need to reach something above you, use a strong step stool that has a grab bar. Keep electrical cords out of the way. Do not use floor polish or wax that makes floors slippery. If you must use wax, use non-skid floor wax. Do not have throw rugs and other things on the floor that can make you trip. What can I do  with my stairs? Do not leave any items on the stairs. Make sure that there are handrails on both sides of the stairs and use them. Fix handrails that are broken or loose. Make sure that handrails are as long as the stairways. Check any carpeting to make sure that it is firmly attached to the stairs. Fix any carpet that is loose or worn. Avoid having throw rugs at the top or bottom of the stairs. If you do have throw rugs, attach them to the floor with carpet tape. Make sure that you have a light switch at the top of the stairs and the bottom of the stairs. If you do not have them, ask someone to add them for you. What else can I do to help prevent falls? Wear shoes that: Do not have high heels. Have rubber bottoms. Are comfortable and fit you well. Are closed at the toe. Do not wear sandals. If you use a stepladder: Make  sure that it is fully opened. Do not climb a closed stepladder. Make sure that both sides of the stepladder are locked into place. Ask someone to hold it for you, if possible. Clearly mark and make sure that you can see: Any grab bars or handrails. First and last steps. Where the edge of each step is. Use tools that help you move around (mobility aids) if they are needed. These include: Canes. Walkers. Scooters. Crutches. Turn on the lights when you go into a dark area. Replace any light bulbs as soon as they burn out. Set up your furniture so you have a clear path. Avoid moving your furniture around. If any of your floors are uneven, fix them. If there are any pets around you, be aware of where they are. Review your medicines with your doctor. Some medicines can make you feel dizzy. This can increase your chance of falling. Ask your doctor what other things that you can do to help prevent falls. This information is not intended to replace advice given to you by your health care provider. Make sure you discuss any questions you have with your health care provider. Document Released: 05/12/2009 Document Revised: 12/22/2015 Document Reviewed: 08/20/2014 Elsevier Interactive Patient Education  2017 [employer].

## 2023-03-04 ENCOUNTER — Encounter: Payer: No Typology Code available for payment source | Admitting: Physical Medicine and Rehabilitation

## 2023-03-05 ENCOUNTER — Encounter
Payer: No Typology Code available for payment source | Attending: Physical Medicine and Rehabilitation | Admitting: Physical Medicine and Rehabilitation

## 2023-03-05 VITALS — BP 172/102 | HR 71 | Ht 61.0 in | Wt 245.0 lb

## 2023-03-05 DIAGNOSIS — I10 Essential (primary) hypertension: Secondary | ICD-10-CM | POA: Diagnosis not present

## 2023-03-05 DIAGNOSIS — F4323 Adjustment disorder with mixed anxiety and depressed mood: Secondary | ICD-10-CM | POA: Insufficient documentation

## 2023-03-05 DIAGNOSIS — M79604 Pain in right leg: Secondary | ICD-10-CM | POA: Diagnosis not present

## 2023-03-05 DIAGNOSIS — M79605 Pain in left leg: Secondary | ICD-10-CM | POA: Insufficient documentation

## 2023-03-05 DIAGNOSIS — G609 Hereditary and idiopathic neuropathy, unspecified: Secondary | ICD-10-CM | POA: Insufficient documentation

## 2023-03-05 NOTE — Patient Instructions (Addendum)
I will be talking to Dr. [place] about your persistency high blood pressure  Follow up with me every 6 months for Q[place]za or sooner if you need anything

## 2023-03-05 NOTE — Progress Notes (Signed)
Subjective:    Patient ID: Jennifer Davila, female    DOB: [birth year], 67 y.o.   MRN: [id number]  HPI  [name] is a 67 y.o. year old female  who  has a past medical history of Arthritis, Constipation, GERD (gastroesophageal reflux disease), Hyperlipidemia, Hypertension, Kidney stones, Trouble swallowing (2013), and Urticaria.   They are presenting to PM&R clinic for follow up related to peripheral neuropathy  .  Plan from last visit: Bilateral leg pain Hereditary and idiopathic peripheral neuropathy Leg cramping After ensuring blood pressure systolic less than 170, diastolic less than 100, proceeded with Qutenza patch treatment today   Resume Usual Activities. Notify Physician of any unusual erythema or concern for side effects as reviewed above.   Follow up with me in 2 months to review effect   Adjustment disorder with mixed anxiety and depressed mood   Sent prescription for Prozac 20 mg daily.  Reviewed with patient risks for worsening depression and suicidality in the next few weeks; she will call clinic and stop medication if any concerns for this.   She should go to her primary care Dr. Swaziland for further evaluation and ongoing medication prescription, adjustments.  I will forward her today's note and let her know about this plan. She has an appointment in July already.       Interval Hx:  - Therapies: none currently. Joined the [employer] yesterday! Planning on walking around the track.    - Follow ups: She is going to start counseling on 8/15 due to losing her husband at the end of June. She does think Prozac is helping but she still gets intrusive thoughts intermittently. She is moving out of their home soon and looking for somewhere independently to live. Her husband's family visits occasionally but is not a big support.    - Falls: No, but stubbed her left pinky toe 2 weeks ago   - DME: Does feel the compression stockings help    - Medications: She takes  methocarbamol at nighttime and that helps with sleep; has been taking ibuprofen and tylenol combo "just on Sundays" to help her get to shurch, orherwise avoids    - Other concerns: Qutenza worked VERY well for her peripheral nerve pain for about 3 months. Thinks it brought her pain down 5 points on a 10 point scale. Only concern is it's expensive ($280 OOP for 4 patches); cannot do every 3 months but wants to try every 6.    Pain Inventory Average Pain 6 Pain Right Now 8 My pain is burning  In the last 24 hours, has pain interfered with the following? General activity 0 Relation with others 0 Enjoyment of life 0 What TIME of day is your pain at its worst? night Sleep (in general) Fair  Pain is worse with: inactivity Pain improves with: medication Relief from Meds: 7  Family History  Problem Relation Age of Onset   Hypertension Mother    Stroke Mother    Diabetes Father    Alcohol abuse Father    Kidney disease Father    Colon polyps Brother    Cancer - Colon Brother    Alcohol abuse Brother    Heart disease Maternal Grandmother    Hypertension Son    Colon cancer Neg Hx    Esophageal cancer Neg Hx    Rectal cancer Neg Hx    Stomach cancer Neg Hx    Social History   Socioeconomic History   Marital status: Married  Spouse name: Not on file   Number of children: 1   Years of education: Not on file   Highest education level: Not on file  Occupational History   Occupation: caregiver  Tobacco Use   Smoking status: Former    Current packs/day: 0.00    Types: Cigarettes    Quit date: 10/02/1972    Years since quitting: 50.4   Smokeless tobacco: Never  Vaping Use   Vaping status: Never Used  Substance and Sexual Activity   Alcohol use: No   Drug use: No   Sexual activity: Never  Other Topics Concern   Not on file  Social History Narrative   Not on file   Social Determinants of Health   Financial Resource Strain: Low Risk  (02/22/2023)   Overall Financial  Resource Strain (CARDIA)    Difficulty of Paying Living Expenses: Not hard at all  Food Insecurity: No Food Insecurity (02/22/2023)   Hunger Vital Sign    Worried About Running Out of Food in the Last Year: Never true    Ran Out of Food in the Last Year: Never true  Transportation Needs: No Transportation Needs (02/22/2023)   PRAPARE - Administrator, Civil Service (Medical): No    Lack of Transportation (Non-Medical): No  Physical Activity: Inactive (02/22/2023)   Exercise Vital Sign    Days of Exercise per Week: 0 days    Minutes of Exercise per Session: 0 min  Stress: No Stress Concern Present (02/22/2023)   [employer] of Occupational Health - Occupational Stress Questionnaire    Feeling of Stress : Not at all  Social Connections: Moderately Integrated (02/22/2023)   Social Connection and Isolation Panel [NHANES]    Frequency of Communication with Friends and Family: More than three times a week    Frequency of Social Gatherings with Friends and Family: More than three times a week    Attends Religious Services: More than 4 times per year    Active Member of [employer] or Organizations: Yes    Attends Banker Meetings: More than 4 times per year    Marital Status: Widowed   Past Surgical History:  Procedure Laterality Date   ABDOMINAL HYSTERECTOMY     BREAST BIOPSY Left 11/04/2019    BENIGN LYMPH NODE WITH PARACORTICAL HYPERPLASIA   CHOLECYSTECTOMY     COLONOSCOPY N/A 01/08/2018   Procedure: COLONOSCOPY;  Surgeon: Benancio Deeds, MD;  Location: [facility];  Service: Gastroenterology;  Laterality: N/A;   LAPAROSCOPIC GASTRIC BANDING     LAPAROSCOPIC REPAIR AND REMOVAL OF GASTRIC BAND  2013-2014   Past Surgical History:  Procedure Laterality Date   ABDOMINAL HYSTERECTOMY     BREAST BIOPSY Left 11/04/2019    BENIGN LYMPH NODE WITH PARACORTICAL HYPERPLASIA   CHOLECYSTECTOMY     COLONOSCOPY N/A 01/08/2018   Procedure: COLONOSCOPY;  Surgeon:  Benancio Deeds, MD;  Location: [facility];  Service: Gastroenterology;  Laterality: N/A;   LAPAROSCOPIC GASTRIC BANDING     LAPAROSCOPIC REPAIR AND REMOVAL OF GASTRIC BAND  2013-2014   Past Medical History:  Diagnosis Date   Arthritis    Constipation    on stool softener   GERD (gastroesophageal reflux disease)    Hyperlipidemia    Hypertension    Kidney stones    Trouble swallowing 2013   following lap band   Urticaria    There were no vitals taken for this visit.  Opioid Risk Score:   Fall Risk Score:  `  1  Depression screen PHQ 2/9     02/22/2023    2:12 PM 12/05/2022   10:47 AM 09/05/2022   11:01 AM 08/22/2022    2:15 PM 05/16/2022    2:30 PM 04/25/2022   10:54 AM 02/19/2022    2:21 PM  Depression screen PHQ 2/9  Decreased Interest 0 0 0 0 0 0 0  Down, Depressed, Hopeless 0 0  0 0 0 0  PHQ - 2 Score 0 0 0 0 0 0 0  Altered sleeping      1   Tired, decreased energy      1   Change in appetite      1   Feeling bad or failure about yourself       0   Trouble concentrating      0   Moving slowly or fidgety/restless      0   Suicidal thoughts      0   PHQ-9 Score      3   Difficult doing work/chores      Not difficult at all      Review of Systems  Musculoskeletal:        Bilateral hamstring pain Pain in right outer lower leg pain  All other systems reviewed and are negative.     Objective:   Physical Exam   PE: Constitution: Appropriate appearance for age. No apparent distress +Obese Resp: No respiratory distress. No accessory muscle usage. on RA and CTAB Cardio: Well perfused appearance. 2+ peripheral edema isolated to the feet. Abdomen: Nondistended. Nontender.   Psych: Appropriate mood and affect. Neuro: AAOx4. No apparent cognitive deficits  Skin:  L pinky toe lateral with small area of bruising; blancheable, full AROM, no TTP or squeeze pain.   Neurologic Exam:   Sensory exam: Stocking-glove pattern sensory loss in BL LE Motor exam: strength  5/5 throughout bilateral upper extremities and bilateral lower extremities Coordination: Fine motor coordination was normal.   Gait: normal        Assessment & Plan:   CATHELINE BJORNSEN is a 67 y.o. year old female  who  has a past medical history of Arthritis, Constipation, GERD (gastroesophageal reflux disease), Hyperlipidemia, Hypertension, Kidney stones, Trouble swallowing (2013), and Urticaria.   They are presenting to PM&R clinic for follow up related to peripheral neuropathy  .  Bilateral leg pain Hereditary and idiopathic peripheral neuropathy Follow up for Qutenza Q6M; discussed also adjusting to 2 patches over feet instead of 4 for cost reasons  Continue compression stockings  Pinky toe lesion without current concerning findings, however did discuss with neuropathy importance of monitoring for non-blanching skin areas and performing regular skin checks to avoid wounds  Adjustment disorder with mixed anxiety and depressed mood Recently lost her husband, has good family support and is seeking counseling services Denies SI/HI today Benefiting from Prozac started last visit; has not discussed picking it up with her PCP. Discussed with her today having PCP or psychiatry manage medication moving forward.  Hypertension, essential, benign BP persistently 170s/100s today x3 checks; patient asymptomatic I will be talking to Dr. Swaziland about your persistency high blood pressure

## 2023-03-08 DIAGNOSIS — N201 Calculus of ureter: Secondary | ICD-10-CM | POA: Diagnosis not present

## 2023-03-08 DIAGNOSIS — R311 Benign essential microscopic hematuria: Secondary | ICD-10-CM | POA: Diagnosis not present

## 2023-03-08 DIAGNOSIS — R109 Unspecified abdominal pain: Secondary | ICD-10-CM | POA: Diagnosis not present

## 2023-03-08 DIAGNOSIS — N3000 Acute cystitis without hematuria: Secondary | ICD-10-CM | POA: Diagnosis not present

## 2023-03-15 DIAGNOSIS — Q438 Other specified congenital malformations of intestine: Secondary | ICD-10-CM | POA: Diagnosis not present

## 2023-03-15 DIAGNOSIS — R109 Unspecified abdominal pain: Secondary | ICD-10-CM | POA: Diagnosis not present

## 2023-03-15 DIAGNOSIS — N201 Calculus of ureter: Secondary | ICD-10-CM | POA: Diagnosis not present

## 2023-03-15 DIAGNOSIS — N200 Calculus of kidney: Secondary | ICD-10-CM | POA: Diagnosis not present

## 2023-03-15 DIAGNOSIS — Z9049 Acquired absence of other specified parts of digestive tract: Secondary | ICD-10-CM | POA: Diagnosis not present

## 2023-03-18 ENCOUNTER — Telehealth: Payer: Self-pay | Admitting: Family Medicine

## 2023-03-18 ENCOUNTER — Telehealth: Payer: Self-pay | Admitting: Gastroenterology

## 2023-03-18 NOTE — Telephone Encounter (Signed)
I called and spoke with patient. Her BP's have been elevated at her last OV here and her recent OV's. BP readings are around 172/100, 172/90. She has an appointment with gastro on Wednesday & they will recheck her BP then. She is taking the Losartan 100 mg daily and the Spironolactone.

## 2023-03-18 NOTE — Telephone Encounter (Signed)
PT is requesting to speak with a nurse about bleeding, except its coming through her urine lightly. Please advise.

## 2023-03-18 NOTE — Telephone Encounter (Signed)
Pt is taking losartan (COZAAR) 100 MG tablet.  Pt has some concerns regarding her BP, and has questions about her medication.   Please call Pt back to discuss.

## 2023-03-18 NOTE — Telephone Encounter (Signed)
Returned call to patient. Pt reports that she is having vaginal bleeding and light pink blood in urine. Pt was seen by her urologist last week and is currently being treated for a UTI. I informed pt that she will need to follow up with her PCP regarding blood in urine or vaginal bleeding. Pt denies any rectal bleeding. Pt understands that her symptoms are outside of our scope. Pt verbalized understanding and had no concerns.

## 2023-03-27 MED ORDER — AMLODIPINE-OLMESARTAN 5-40 MG PO TABS: 1.0000 | ORAL_TABLET | Freq: Every day | ORAL | 1 refills | Status: DC

## 2023-03-27 NOTE — Telephone Encounter (Signed)
BP elevated on 03/05/23. Amlodipine was discontinued because of lower extremity edema, she reported no significant improvement after discontinuing medication. Recommend stopping losartan and starting combination of medications similar to losartan, olmesartan with low-dose amlodipine, usually better tolerated. Stop Losartan 100 mg. Amlodipine-Olmesartan 5-40 mg daily. Continue wearing compression stockings. Monitor BP at home and F/U in 6-8 weeks. Thanks, BJ

## 2023-03-27 NOTE — Telephone Encounter (Signed)
I called and spoke with patient. We went over the information below. New Rx sent in and med list updated. Patient is aware to check her BP at home and make a follow up appointment for 6 weeks out.

## 2023-03-27 NOTE — Addendum Note (Signed)
Addended by: Kathreen Devoid on: 03/27/2023 11:16 AM   Modules accepted: Orders

## 2023-04-19 ENCOUNTER — Other Ambulatory Visit: Payer: Self-pay

## 2023-04-19 NOTE — Progress Notes (Signed)
04/19/2023  Patient ID: Jennifer Davila, female   DOB: 1956-07-13, 67 y.o.   MRN: [id number]  Contacted patient via telephone to discuss hypertension and respective medications.  Patients confirms she is taking spironolactone 25mg  daily and amlodipine-olmesartan 5-40mg .  Checks BP daily and reports most recent reading is 136/67, reports BP doing well since getting her medications. Instructed patient to continue all meds and routine BP monitoring.  Sherrill Raring, PharmD Clinical Pharmacist [phone]

## 2023-05-03 ENCOUNTER — Encounter (HOSPITAL_COMMUNITY): Payer: Self-pay | Admitting: Emergency Medicine

## 2023-05-03 ENCOUNTER — Emergency Department (HOSPITAL_COMMUNITY): Payer: No Typology Code available for payment source

## 2023-05-03 ENCOUNTER — Other Ambulatory Visit: Payer: Self-pay

## 2023-05-03 ENCOUNTER — Emergency Department (HOSPITAL_COMMUNITY)
Admission: EM | Admit: 2023-05-03 | Discharge: 2023-05-03 | Disposition: A | Discharge status: Home / Self Care | Payer: No Typology Code available for payment source | Attending: Emergency Medicine | Admitting: Emergency Medicine

## 2023-05-03 DIAGNOSIS — K5732 Diverticulitis of large intestine without perforation or abscess without bleeding: Secondary | ICD-10-CM | POA: Insufficient documentation

## 2023-05-03 DIAGNOSIS — R1032 Left lower quadrant pain: Secondary | ICD-10-CM | POA: Diagnosis present

## 2023-05-03 DIAGNOSIS — K5792 Diverticulitis of intestine, part unspecified, without perforation or abscess without bleeding: Secondary | ICD-10-CM | POA: Diagnosis not present

## 2023-05-03 DIAGNOSIS — K7689 Other specified diseases of liver: Secondary | ICD-10-CM | POA: Diagnosis not present

## 2023-05-03 DIAGNOSIS — N200 Calculus of kidney: Secondary | ICD-10-CM | POA: Diagnosis not present

## 2023-05-03 DIAGNOSIS — K429 Umbilical hernia without obstruction or gangrene: Secondary | ICD-10-CM | POA: Diagnosis not present

## 2023-05-03 LAB — COMPREHENSIVE METABOLIC PANEL
ALT: 14 U/L (ref 0–44)
AST: 16 U/L (ref 15–41)
Albumin: 4.1 g/dL (ref 3.5–5.0)
Alkaline Phosphatase: 106 U/L (ref 38–126)
Anion gap: 9 (ref 5–15)
BUN: 16 mg/dL (ref 8–23)
CO2: 26 mmol/L (ref 22–32)
Calcium: 8.9 mg/dL (ref 8.9–10.3)
Chloride: 102 mmol/L (ref 98–111)
Creatinine, Ser: 1 mg/dL (ref 0.44–1.00)
GFR, Estimated: >60 mL/min (ref >60)
Glucose, Bld: 112 mg/dL — ABNORMAL HIGH (ref 70–99)
Potassium: 3.7 mmol/L (ref 3.5–5.1)
Sodium: 137 mmol/L (ref 135–145)
Total Bilirubin: 0.5 mg/dL (ref 0.3–1.2)
Total Protein: 7.7 g/dL (ref 6.5–8.1)

## 2023-05-03 LAB — CBC WITH DIFFERENTIAL/PLATELET
Abs Immature Granulocytes: 0.02 10*3/uL (ref 0.00–0.07)
Basophils Absolute: 0 10*3/uL (ref 0.0–0.1)
Basophils Relative: 0 %
Eosinophils Absolute: 0.1 10*3/uL (ref 0.0–0.5)
Eosinophils Relative: 2 %
HCT: 40.5 % (ref 36.0–46.0)
Hemoglobin: 12.5 g/dL (ref 12.0–15.0)
Immature Granulocytes: 0 %
Lymphocytes Relative: 18 %
Lymphs Abs: 1.7 10*3/uL (ref 0.7–4.0)
MCH: 23.5 pg — ABNORMAL LOW (ref 26.0–34.0)
MCHC: 30.9 g/dL (ref 30.0–36.0)
MCV: 76.3 fL — ABNORMAL LOW (ref 80.0–100.0)
Monocytes Absolute: 0.6 10*3/uL (ref 0.1–1.0)
Monocytes Relative: 6 %
Neutro Abs: 6.9 10*3/uL (ref 1.7–7.7)
Neutrophils Relative %: 74 %
Platelets: 308 10*3/uL (ref 150–400)
RBC: 5.31 MIL/uL — ABNORMAL HIGH (ref 3.87–5.11)
RDW: 14.6 % (ref 11.5–15.5)
WBC: 9.3 10*3/uL (ref 4.0–10.5)
nRBC: 0 % (ref 0.0–0.2)

## 2023-05-03 LAB — LIPASE, BLOOD: Lipase: 22 U/L (ref 11–51)

## 2023-05-03 MED ORDER — ONDANSETRON 4 MG PO TBDP: 4.0000 mg | ORAL_TABLET | ORAL | 0 refills | Status: DC | PRN

## 2023-05-03 MED ORDER — OXYCODONE-ACETAMINOPHEN 5-325 MG PO TABS
1.0000 | ORAL_TABLET | Freq: Four times a day (QID) | ORAL | 0 refills | Status: DC | PRN
Start: 2023-05-03 — End: 2023-10-17

## 2023-05-03 MED ORDER — SODIUM CHLORIDE 0.9 % IV SOLN: Freq: Once | INTRAVENOUS | Status: AC

## 2023-05-03 MED ORDER — METRONIDAZOLE 500 MG PO TABS: 500.0000 mg | ORAL_TABLET | Freq: Two times a day (BID) | ORAL | 0 refills | Status: DC

## 2023-05-03 MED ORDER — CIPROFLOXACIN HCL 500 MG PO TABS: 500.0000 mg | ORAL_TABLET | Freq: Two times a day (BID) | ORAL | 0 refills | Status: DC

## 2023-05-03 NOTE — ED Provider Notes (Signed)
[place] EMERGENCY DEPARTMENT AT [facility] Provider Note   CSN: [id number] Arrival date & time: 05/03/23  4098     History  Chief Complaint  Patient presents with   Abdominal Pain    Jennifer Davila is a 67 y.o. female.  HPI Patient while she has been having the left lateral abdominal pain and flank pain for several days.  She reports its gotten worse and she is more comfortable.  She reports is both a cramping and aching quality to it.  She reports she is felt a little hot occasionally but has not had a fever that she is documented.  She has had nausea but no episodes of vomiting.  She denies pain or burning with urination.  She has not seen any blood in the urine.  Patient reports she does have a history of diverticulitis and required hospitalization.  She thinks it might be a flareup of that.    Home Medications Prior to Admission medications   Medication Sig Start Date End Date Taking? Authorizing Provider  ciprofloxacin (CIPRO) 500 MG tablet Take 1 tablet (500 mg total) by mouth every 12 (twelve) hours. 05/03/23  Yes Arby Barrette, MD  metroNIDAZOLE (FLAGYL) 500 MG tablet Take 1 tablet (500 mg total) by mouth 2 (two) times daily. One po bid x 7 days 05/03/23  Yes Omar Gayden, Lebron Conners, MD  ondansetron (ZOFRAN-ODT) 4 MG disintegrating tablet Take 1 tablet (4 mg total) by mouth every 4 (four) hours as needed. 05/03/23  Yes Arby Barrette, MD  oxyCODONE-acetaminophen (PERCOCET/ROXICET) 5-325 MG tablet Take 1 tablet by mouth every 6 (six) hours as needed for severe pain. 05/03/23  Yes Arby Barrette, MD  acetaminophen (TYLENOL) 500 MG tablet Take 1-2 tablets (500-1,000 mg total) by mouth daily as needed for moderate pain (pain). 03/22/21   [name], MD  amLODipine-olmesartan (AZOR) 5-40 MG tablet Take 1 tablet by mouth daily. 03/27/23   [place], [name], MD  b complex vitamins capsule Take vitamin B complex three times daily, make sure the complex is containing at least  30 mg of vitamin B6 09/05/22   Elijah Birk C, DO  calcium carbonate (OSCAL) 1500 (600 Ca) MG TABS tablet Take 1 tablet by mouth 2 (two) times daily with a meal.    [provider]  capsaicin topical system (QUTENZA, 4 PATCH,) 8 % Apply 4 patches topically once for 1 dose. Place on affected area of skin every 3 months as needed for neuropathy 01/02/24 01/02/24  [name], DO  Ferrous Sulfate (IRON) 325 (65 Fe) MG TABS Take one tablet every other day. 09/05/22   [name], DO  FLUoxetine (PROZAC) 20 MG capsule Take 1 capsule (20 mg total) by mouth daily. 01/02/23   [name], DO  methocarbamol (ROBAXIN-750) 750 MG tablet Take 1 tablet (750 mg total) by mouth every 6 (six) hours as needed for muscle spasms (posterior thigh pain). 12/05/22   [name], DO  omeprazole (PRILOSEC) 20 MG capsule Take 1 capsule (20 mg total) by mouth daily. 09/12/22   Armbruster, [name], MD  polyethylene glycol (MIRALAX / GLYCOLAX) 17 g packet Take 17 g by mouth every other day.    [provider]  pravastatin (PRAVACHOL) 20 MG tablet TAKE 1 TABLET BY MOUTH EVERY DAY 10/29/22   [place], [name], MD  spironolactone (ALDACTONE) 25 MG tablet TAKE 1 TABLET (25 MG TOTAL) BY MOUTH DAILY. 02/11/23   [place], [name], MD  traZODone (DESYREL)  50 MG tablet Take 0.5-1 tablets (25-50 mg total) by mouth at bedtime. 02/20/23   [place], [name], MD      Allergies    Patient has no known allergies.    Review of Systems   Review of Systems  Physical Exam Updated Vital Signs BP 124/73   Pulse 82   Temp 98.4 F (36.9 C) (Oral)   Resp 16   Ht 5\' 1"  (1.549 m)   Wt 110.2 kg   SpO2 96%   BMI 45.91 kg/m  Physical Exam Constitutional:      Comments: Alert and nontoxic.  Clear mental status.  No respiratory distress.  HENT:     Mouth/Throat:     Pharynx: Oropharynx is clear.  Eyes:     Extraocular Movements: Extraocular movements intact.  Cardiovascular:     Rate and Rhythm: Normal rate and  regular rhythm.  Pulmonary:     Effort: Pulmonary effort is normal.     Breath sounds: Normal breath sounds.  Abdominal:     Comments: Abdomen soft without guarding.  Moderate pain to palpation left lateral abdomen.  No significant CVA tenderness.  Musculoskeletal:        General: Normal range of motion.  Skin:    General: Skin is warm and dry.  Neurological:     General: No focal deficit present.     Mental Status: She is oriented to person, place, and time.     Coordination: Coordination normal.  Psychiatric:        Mood and Affect: Mood normal.     ED Results / Procedures / Treatments   Labs (all labs ordered are listed, but only abnormal results are displayed) Labs Reviewed  COMPREHENSIVE METABOLIC PANEL - Abnormal; Notable for the following components:      Result Value   Glucose, Bld 112 (*)    All other components within normal limits  CBC WITH DIFFERENTIAL/PLATELET - Abnormal; Notable for the following components:   RBC 5.31 (*)    MCV 76.3 (*)    MCH 23.5 (*)    All other components within normal limits  LIPASE, BLOOD  URINALYSIS, ROUTINE W REFLEX MICROSCOPIC    EKG None  Radiology CT Renal Stone Study  Result Date: 05/03/2023 CLINICAL DATA:  Abdominal/flank pain, stone suspected EXAM: CT ABDOMEN AND PELVIS WITHOUT CONTRAST TECHNIQUE: Multidetector CT imaging of the abdomen and pelvis was performed following the standard protocol without IV contrast. RADIATION DOSE REDUCTION: This exam was performed according to the departmental dose-optimization program which includes automated exposure control, adjustment of the mA and/or kV according to patient size and/or use of iterative reconstruction technique. COMPARISON:  CT scan urogram from 03/15/2023. FINDINGS: Lower chest: The lung bases are clear. No pleural effusion. The heart is normal in size. No pericardial effusion. Hepatobiliary: The liver is normal in size. Non-cirrhotic configuration. No suspicious mass. Note  is made of a stable subcentimeter hypoattenuating focus in the left hepatic dome, which is too small to adequately characterize but appears unchanged since the prior study dating back to 2019 and favored to represent a cyst. No intrahepatic or extrahepatic bile duct dilation. Gallbladder is surgically absent. Pancreas: Unremarkable. No pancreatic ductal dilatation or surrounding inflammatory changes. Spleen: Within normal limits. No focal lesion. Adrenals/Urinary Tract: Adrenal glands are unremarkable. No suspicious renal mass. No hydronephrosis. Redemonstration of a punctate nonobstructing calculus in the right kidney lower pole. No other renal or ureteric calculi. Unremarkable urinary bladder. Stomach/Bowel: There is a small sliding hiatal hernia.  There is an approximately 5-6 cm long segment of distal descending colon exhibiting irregular circumferential wall thickening and moderate pericolonic fat stranding on the background of several diverticula, compatible with acute uncomplicated diverticulitis. There is associated trace reactive ascites in the left paracolic gutter. No walled-off abscess or loculated collection. No pneumoperitoneum, pneumatosis or portal venous gas. This is same segment which was affected on the prior exam from 03/15/2023; however, degree of inflammation is more severe on this exam. No disproportionate dilation of the small or large bowel loops. The appendix is unremarkable. Vascular/Lymphatic: No ascites or pneumoperitoneum. No abdominal or pelvic lymphadenopathy, by size criteria. No aneurysmal dilation of the major abdominal arteries. Reproductive: The uterus is surgically absent. No large adnexal mass. Other: There is a tiny fat containing umbilical hernia. The soft tissues and abdominal wall are otherwise unremarkable. Musculoskeletal: No suspicious osseous lesions. There are mild multilevel degenerative changes in the visualized spine. IMPRESSION: 1. Acute uncomplicated diverticulitis  of the distal descending colon. No walled-off abscess or loculated collection. No pneumoperitoneum. This is the same segment which was affected on the prior exam from 03/15/2023. 2. Punctate nonobstructing calculus in the right kidney lower pole. No other nephroureterolithiasis or obstructive uropathy. 3. Multiple other nonacute observations, as described above. Electronically Signed   By: Jules Schick M.D.   On: 05/03/2023 12:12    Procedures Procedures    Medications Ordered in ED Medications  0.9 %  sodium chloride infusion ( Intravenous New Bag/Given 05/03/23 1024)    ED Course/ Medical Decision Making/ A&P                                 Medical Decision Making Amount and/or Complexity of Data Reviewed Labs: ordered. Radiology: ordered.  Risk Prescription drug management.   Patient presents with worsening left lateral abdominal pain.  He is alert and nontoxic.  Patient has known history of diverticulitis.  Differential diagnosis includes kidney stone\pyelonephritis\bowel obstruction\diverticulitis.  Will proceed with lab work, rehydration and CT scan.  CT scan trip by radiology positive for focal area of diverticulitis.  No rupture.  Chemistry panel with LFTs within normal limits.  White count 9.3.  H&H stable 12.5 and 40.  Normal differential.  Lipase 22.  At this time with pain presentation consistent with focal area of diverticulitis confirmed on CT scan, I feel patient is stable to treat on outpatient basis with Cipro and Flagyl.  She is otherwise clinically well in appearance without other significant symptoms of fever, vomiting or bloody stool.  Reviewed this plan and discussed return precautions and follow-up.  Patient voices understanding.        Final Clinical Impression(s) / ED Diagnoses Final diagnoses:  Diverticulitis    Rx / DC Orders ED Discharge Orders          Ordered    ciprofloxacin (CIPRO) 500 MG tablet  Every 12 hours        05/03/23 1237     metroNIDAZOLE (FLAGYL) 500 MG tablet  2 times daily        05/03/23 1237    oxyCODONE-acetaminophen (PERCOCET/ROXICET) 5-325 MG tablet  Every 6 hours PRN        05/03/23 1237    ondansetron (ZOFRAN-ODT) 4 MG disintegrating tablet  Every 4 hours PRN        05/03/23 1237              Arby Barrette, MD 05/03/23 1242

## 2023-05-03 NOTE — ED Triage Notes (Signed)
Pt arrived POV c/o severe LLQ abd pain accompanied by multiple episodes of diarrhea. Pt denies fever, has had some nausea, no vomiting. Reports pt reports hx of diverticulitis.

## 2023-05-03 NOTE — Discharge Instructions (Signed)
1.  Start taking antibiotics today as prescribed. 2.  You may take 1 Percocet tablet every 4-6 hours for pain.  As soon as your pain is improving, try to change to regular strength Tylenol every 4-6 hours.  You have been given a prescription for Zofran to take for nausea if needed. 3.  Schedule follow-up appointment with your doctor for recheck within the next 2 to 5 days. 4.  Return to the emergency department if you develop sudden worsening pain, fever, vomiting or other concerning changes.

## 2023-05-21 ENCOUNTER — Other Ambulatory Visit: Payer: Self-pay | Admitting: Family Medicine

## 2023-05-21 DIAGNOSIS — Z1231 Encounter for screening mammogram for malignant neoplasm of breast: Secondary | ICD-10-CM

## 2023-05-25 ENCOUNTER — Other Ambulatory Visit: Payer: Self-pay | Admitting: Family Medicine

## 2023-05-25 DIAGNOSIS — F5102 Adjustment insomnia: Secondary | ICD-10-CM

## 2023-05-29 ENCOUNTER — Encounter: Payer: Self-pay | Admitting: Family Medicine

## 2023-05-29 ENCOUNTER — Ambulatory Visit (INDEPENDENT_AMBULATORY_CARE_PROVIDER_SITE_OTHER): Payer: No Typology Code available for payment source | Admitting: Family Medicine

## 2023-05-29 VITALS — BP 128/80 | HR 90 | Temp 98.3°F | Resp 16 | Ht 61.0 in | Wt 250.0 lb

## 2023-05-29 DIAGNOSIS — I10 Essential (primary) hypertension: Secondary | ICD-10-CM

## 2023-05-29 DIAGNOSIS — R5383 Other fatigue: Secondary | ICD-10-CM

## 2023-05-29 DIAGNOSIS — M79605 Pain in left leg: Secondary | ICD-10-CM

## 2023-05-29 DIAGNOSIS — K5792 Diverticulitis of intestine, part unspecified, without perforation or abscess without bleeding: Secondary | ICD-10-CM | POA: Diagnosis not present

## 2023-05-29 DIAGNOSIS — M79604 Pain in right leg: Secondary | ICD-10-CM | POA: Diagnosis not present

## 2023-05-29 DIAGNOSIS — K5731 Diverticulosis of large intestine without perforation or abscess with bleeding: Secondary | ICD-10-CM

## 2023-05-29 DIAGNOSIS — E785 Hyperlipidemia, unspecified: Secondary | ICD-10-CM | POA: Diagnosis not present

## 2023-05-29 DIAGNOSIS — E876 Hypokalemia: Secondary | ICD-10-CM

## 2023-05-29 MED ORDER — AMLODIPINE-OLMESARTAN 5-40 MG PO TABS: 1.0000 | ORAL_TABLET | Freq: Every day | ORAL | 2 refills | Status: DC

## 2023-05-29 MED ORDER — IBUPROFEN 400 MG PO TABS
400.0000 mg | ORAL_TABLET | ORAL | 0 refills | Status: DC
Start: 2023-05-29 — End: 2024-06-03

## 2023-05-29 NOTE — Patient Instructions (Addendum)
A few things to remember from today's visit:  Diverticulosis of colon with hemorrhage  Hypertension, essential, benign  Bilateral leg pain  Hypokalemia No changes today. Caution with Ibuprofen, just take it Sundays as needed.  If you need refills for medications you take chronically, please call your pharmacy. Do not use My Chart to request refills or for acute issues that need immediate attention. If you send a my chart message, it may take a few days to be addressed, specially if I am not in the office.  Please be sure medication list is accurate. If a new problem present, please set up appointment sooner than planned today.

## 2023-05-29 NOTE — Progress Notes (Signed)
Chief Complaint  Patient presents with   Follow-up   HPI: [name] is a 67 y.o. female with a PMHx significant for HTN, peripheral neuropathy, diverticulitis, GERD, chronic urticaria, osteopenia, HLD, iron deficiency anemia, prediabetes, and anxiety and depression, among some, who is here today for ER follow up.Marland Kitchen   She was seen in the ER on 10/4 for abdominal pain and Dx'ed with mild diverticulitis.  CT showed mild inflammation of diverticula.  She prescribed Metronidazole and Ciprofloxacin, which she has finished, and now says her abdominal pain is gone.  She regularly takes a stool softener to avoid constipation..  She says she has been drinking sufficient fluids.   Hypertension: BP was elevated during pain management visit 03/05/23. Currently on amlodipine-olmesartan 5-40 mg daily and Spironolactone 25 mg daily.  BP readings at home: She checks her BP regularly at home and normal.  Side effects: none  Negative for unusual or severe headache, visual changes, exertional chest pain, dyspnea,  focal weakness, or worsening edema.  Lab Results  Component Value Date   NA 137 05/03/2023   CL 102 05/03/2023   K 3.7 05/03/2023   CO2 26 05/03/2023   BUN 16 05/03/2023   CREATININE 1.00 05/03/2023   GFRNONAA >60 05/03/2023   CALCIUM 8.9 05/03/2023   ALBUMIN 4.1 05/03/2023   GLUCOSE 112 (H) 05/03/2023   Hyperlipidemia: Currently on pravastatin 20 mg daily, states that she needs refills. Lab Results  Component Value Date   CHOL 173 11/01/2021   HDL 40.60 11/01/2021   LDLCALC 101 (H) 11/01/2021   TRIG 155.0 (H) 11/01/2021   CHOLHDL 4 11/01/2021   Concerns today:   Weakness/fatigue:  Patient complains today of weakness/"sluggish" and fatigue for the last 2-3 days.  She has been taking OTC Nervive nerve relief pills 3 pills per day since 10/25, but not today. She believes symptoms are caused by this medication, she feels mildly better today.   She denies headache,  numbness, tingling, nausea, vomiting, or chest pain.   She mentions she is having some depressive symptoms since her husband's passing. She is hoping to get a part time job soon.   -LE pain and peripheral neuropathy: She is requesting refills of Ibuprofen 400 mg, which she takes for LE pain when she goes to church on Sundays. She has hx of GI bleed. Last hospitalization for GI bleed in 06/2021. She has tried different medications, including Hydrocodone-Acetaminophen,Tramadol,and gabapentin.  [facility] Denies abdominal pain, blood in stool or melena.  Review of Systems  Constitutional:  Negative for chills and fever.  HENT:  Negative for nosebleeds and sore throat.   Respiratory:  Negative for cough and wheezing.   Genitourinary:  Negative for decreased urine volume, dysuria and hematuria.  Skin:  Negative for rash.  Neurological:  Negative for syncope and facial asymmetry.  Psychiatric/Behavioral:  Negative for confusion and hallucinations.   See other pertinent positives and negatives in HPI.  Current Outpatient Medications on File Prior to Visit  Medication Sig Dispense Refill   acetaminophen (TYLENOL) 500 MG tablet Take 1-2 tablets (500-1,000 mg total) by mouth daily as needed for moderate pain (pain). 60 tablet 1   b complex vitamins capsule Take vitamin B complex three times daily, make sure the complex is containing at least 30 mg of vitamin B6 80 capsule 6   calcium carbonate (OSCAL) 1500 (600 Ca) MG TABS tablet Take 1 tablet by mouth 2 (two) times daily with  a meal.     [START ON 01/02/2024] capsaicin topical system (QUTENZA, 4 PATCH,) 8 % Apply 4 patches topically once for 1 dose. Place on affected area of skin every 3 months as needed for neuropathy 4 patch 0   Ferrous Sulfate (IRON) 325 (65 Fe) MG TABS Take one tablet every other day. 15 tablet 0   FLUoxetine (PROZAC) 20 MG capsule Take 1 capsule (20 mg total) by mouth daily. 90  capsule 3   methocarbamol (ROBAXIN-750) 750 MG tablet Take 1 tablet (750 mg total) by mouth every 6 (six) hours as needed for muscle spasms (posterior thigh pain). 90 tablet 2   omeprazole (PRILOSEC) 20 MG capsule Take 1 capsule (20 mg total) by mouth daily. 90 capsule 3   ondansetron (ZOFRAN-ODT) 4 MG disintegrating tablet Take 1 tablet (4 mg total) by mouth every 4 (four) hours as needed. 20 tablet 0   oxyCODONE-acetaminophen (PERCOCET/ROXICET) 5-325 MG tablet Take 1 tablet by mouth every 6 (six) hours as needed for severe pain. 20 tablet 0   polyethylene glycol (MIRALAX / GLYCOLAX) 17 g packet Take 17 g by mouth every other day.     pravastatin (PRAVACHOL) 20 MG tablet TAKE 1 TABLET BY MOUTH EVERY DAY 90 tablet 3   spironolactone (ALDACTONE) 25 MG tablet TAKE 1 TABLET (25 MG TOTAL) BY MOUTH DAILY. 90 tablet 1   traZODone (DESYREL) 50 MG tablet TAKE 1/2 TO 1 TABLET (25 TO 50 MG TOTAL) BY MOUTH AT BEDTIME 30 tablet 2   No current facility-administered medications on file prior to visit.    Past Medical History:  Diagnosis Date   Arthritis    Constipation    on stool softener   GERD (gastroesophageal reflux disease)    Hyperlipidemia    Hypertension    Kidney stones    Trouble swallowing 2013   following lap band   Urticaria    No Known Allergies  Social History   Socioeconomic History   Marital status: Married    Spouse name: Not on file   Number of children: 1   Years of education: Not on file   Highest education level: Not on file  Occupational History   Occupation: caregiver  Tobacco Use   Smoking status: Former    Current packs/day: 0.00    Types: Cigarettes    Quit date: 10/02/1972    Years since quitting: 50.6   Smokeless tobacco: Never  Vaping Use   Vaping status: Never Used  Substance and Sexual Activity   Alcohol use: No   Drug use: No   Sexual activity: Never  Other Topics Concern   Not on file  Social History Narrative   Not on file   Social  Determinants of Health   Financial Resource Strain: Low Risk  (02/22/2023)   Overall Financial Resource Strain (CARDIA)    Difficulty of Paying Living Expenses: Not hard at all  Food Insecurity: No Food Insecurity (02/22/2023)   Hunger Vital Sign    Worried About Running Out of Food in the Last Year: Never true    Ran Out of Food in the Last Year: Never true  Transportation Needs: No Transportation Needs (02/22/2023)   PRAPARE - Administrator, Civil Service (Medical): No    Lack of Transportation (Non-Medical): No  Physical Activity: Inactive (02/22/2023)   Exercise Vital Sign    Days of Exercise per Week: 0 days    Minutes of Exercise per Session: 0 min  Stress: No Stress  Concern Present (02/22/2023)   [employer] of Occupational Health - Occupational Stress Questionnaire    Feeling of Stress : Not at all  Social Connections: Moderately Integrated (02/22/2023)   Social Connection and Isolation Panel [NHANES]    Frequency of Communication with Friends and Family: More than three times a week    Frequency of Social Gatherings with Friends and Family: More than three times a week    Attends Religious Services: More than 4 times per year    Active Member of [employer] or Organizations: Yes    Attends Banker Meetings: More than 4 times per year    Marital Status: Widowed    Vitals:   05/29/23 1204  BP: 128/80  Pulse: 90  Resp: 16  Temp: 98.3 F (36.8 C)  SpO2: 97%   Body mass index is 47.24 kg/m.  Physical Exam Vitals and nursing note reviewed.  Constitutional:      General: She is not in acute distress.    Appearance: She is well-developed.  HENT:     Head: Normocephalic and atraumatic.     Mouth/Throat:     Mouth: Mucous membranes are dry.     Pharynx: Oropharynx is clear.  Eyes:     Conjunctiva/sclera: Conjunctivae normal.  Cardiovascular:     Rate and Rhythm: Normal rate and regular rhythm.     Pulses:          Posterior tibial pulses are  2+ on the right side and 2+ on the left side.     Heart sounds: No murmur heard.    Comments: Trace pitting LE edema, bilateral. Pulmonary:     Effort: Pulmonary effort is normal. No respiratory distress.     Breath sounds: Normal breath sounds.  Abdominal:     Palpations: Abdomen is soft. There is no hepatomegaly or mass.     Tenderness: There is no abdominal tenderness.  Skin:    General: Skin is warm.     Findings: No erythema or rash.  Neurological:     General: No focal deficit present.     Mental Status: She is alert and oriented to person, place, and time.     Cranial Nerves: No cranial nerve deficit.     Gait: Gait normal.     Comments: She has some difficulty identifying today's date, initially 05/27/2024,correct 2nd try.  Psychiatric:        Mood and Affect: Mood and affect normal.    ASSESSMENT AND PLAN:  Ms. Smola was seen today for ER follow up for diverticulitis.   Bilateral leg pain Assessment & Plan: Requesting new refills for Ibuprofen 400 mg, which is the only medication that helps with pain. We discussed side effects, including GI bleed and CVD. She takes Ibuprofen just when she goes to church. Rx sent. Instructed about warning signs.  Orders: -     Ibuprofen; Take 1 tablet (400 mg total) by mouth once a week.  Dispense: 30 tablet; Refill: 0  Other fatigue Reporting feeling "sluggish" for the past few days, no associated symptoms. Increase water intake. I do not think we need further work up today but she was clearly instructed about warning signs, she voices understanding. She is not going to resume OTC  Nervive nerve.  Acute diverticulitis of intestine Completed abx treatment and symptoms have resolved. Continue adequate fiber and fluid intake to avoid constipation as well as OTC stool softener.  Hypertension, essential, benign Assessment & Plan: BP adequately controlled. Continue Amlodipine-Olmesartan 5-40  mg daily and spironolactone 25 mg daily  as well as low salt diet. Continue monitoring BP at home. Continue low-salt/DASH diet.  Orders: -     amLODIPine-Olmesartan; Take 1 tablet by mouth daily.  Dispense: 90 tablet; Refill: 2  Hyperlipidemia, unspecified hyperlipidemia type Assessment & Plan: Continue Pravastatin 20 mg daily and low fat diet.She has medication until 10/2023. She is not fasting today. Will plan on checking FLP next visit.  Return in about 6 months (around 11/27/2023).  I, Rolla Etienne Wierda, acting as a scribe for Zollie Clemence [place], MD., have documented all relevant documentation on the behalf of Shannia Jacuinde [place], MD, as directed by  Jennylee Uehara [place], MD while in the presence of Anzlee Hinesley [place], MD.   I, Zlatan Hornback [place], MD, have reviewed all documentation for this visit. The documentation on 05/31/23 for the exam, diagnosis, procedures, and orders are all accurate and complete.  Bomani Oommen G. [place], MD  [facility]. [place] office.

## 2023-05-31 MED ORDER — SPIRONOLACTONE 25 MG PO TABS: 25.0000 mg | ORAL_TABLET | Freq: Every day | ORAL | 1 refills | Status: DC

## 2023-05-31 NOTE — Assessment & Plan Note (Signed)
Continue Pravastatin 20 mg daily and low fat diet. Will plan on checking FLP next visit.

## 2023-05-31 NOTE — Assessment & Plan Note (Addendum)
BP adequately controlled. Continue Amlodipine-Olmesartan 5-40 mg daily and spironolactone 25 mg daily as well as low salt diet. Continue monitoring BP at home. Continue low-salt/DASH diet.

## 2023-05-31 NOTE — Assessment & Plan Note (Signed)
Requesting new refills for Ibuprofen 400 mg, which is the only medication that helps with pain. We discussed side effects, including GI bleed and CVD. She takes Ibuprofen just when she goes to church. Rx sent. Instructed about warning signs.

## 2023-06-06 ENCOUNTER — Encounter: Payer: Self-pay | Admitting: Family Medicine

## 2023-06-06 ENCOUNTER — Ambulatory Visit (INDEPENDENT_AMBULATORY_CARE_PROVIDER_SITE_OTHER): Payer: No Typology Code available for payment source | Admitting: Family Medicine

## 2023-06-06 VITALS — BP 142/86 | HR 107 | Temp 98.9°F | Ht 61.0 in | Wt 264.2 lb

## 2023-06-06 DIAGNOSIS — Z87442 Personal history of urinary calculi: Secondary | ICD-10-CM

## 2023-06-06 DIAGNOSIS — R319 Hematuria, unspecified: Secondary | ICD-10-CM | POA: Diagnosis not present

## 2023-06-06 LAB — POC URINALSYSI DIPSTICK (AUTOMATED)
Glucose, UA: NEGATIVE
Ketones, UA: NEGATIVE
Leukocytes, UA: NEGATIVE
Nitrite, UA: NEGATIVE
Protein, UA: POSITIVE — AB
Spec Grav, UA: 1.025 (ref 1.010–1.025)
Urobilinogen, UA: 1 U/dL
pH, UA: 5 (ref 5.0–8.0)

## 2023-06-06 MED ORDER — TAMSULOSIN HCL 0.4 MG PO CAPS
0.4000 mg | ORAL_CAPSULE | Freq: Every day | ORAL | 0 refills | Status: DC
Start: 2023-06-06 — End: 2023-10-17

## 2023-06-06 NOTE — Progress Notes (Signed)
Established Patient Office Visit   Subjective  Patient ID: Jennifer Davila, female    DOB: September 17, 1955  Age: 67 y.o. MRN: [id number]  Chief Complaint  Patient presents with   Hematuria    Started 5 days ago, blood in the urine, vomiting and upper body is sore,     Patient is a 67 year old female followed with Dr. Shawnie Pons is seen for acute concern.  Patient endorses blood in urine x 5 days.  Light pink seen in adult group.  Notes an episode of emesis and upper body feels sore.  Notes bilateral vague pain patient drinking 3-6 bottles of water per day as well as coffee and tea.  Pt had recent CT stone study in ED.  Non obstructing renal calculi in R lower pole noted as well as diverticulitis in ascending colon.  Denies dysuria, frequency, back pain, fever, chills, constipation.     Patient Active Problem List   Diagnosis Date Noted   Adjustment insomnia 02/20/2023   Adjustment disorder with mixed anxiety and depressed mood 01/02/2023   Dieting 09/10/2022   Skin pruritus 08/22/2022   Hereditary and idiopathic peripheral neuropathy 05/30/2022   Osteopenia 05/14/2022   Gait difficulty 04/25/2022   Nephrolithiasis 03/27/2022   COVID-19 virus infection 07/19/2021   GI bleed 07/18/2021   Prediabetes 05/30/2021   Bilateral leg pain 06/03/2020   Chronic urticaria 05/26/2020   Seasonal and perennial allergic rhinitis 05/26/2020   Positive ANA (antinuclear antibody) 05/26/2020   Iron deficiency anemia 07/07/2018   Hematochezia 06/26/2018   Diverticulosis of colon with hemorrhage    Leg pain 01/06/2018   Bilateral lower extremity edema 01/06/2018   Lower GI bleed 01/06/2018   Hypokalemia 01/06/2018   Constipation 01/06/2018   Leg cramping 10/18/2017   Vitamin D deficiency, unspecified 10/18/2017   GERD (gastroesophageal reflux disease) 10/18/2017   Hypertension, essential, benign 09/04/2017   Hyperlipidemia 09/04/2017   Morbid obesity (HCC) 09/04/2017   Vertigo 09/04/2017   Hip  pain 05/25/2015   Past Medical History:  Diagnosis Date   Arthritis    Constipation    on stool softener   GERD (gastroesophageal reflux disease)    Hyperlipidemia    Hypertension    Kidney stones    Trouble swallowing 2013   following lap band   Urticaria    Past Surgical History:  Procedure Laterality Date   ABDOMINAL HYSTERECTOMY     BREAST BIOPSY Left 11/04/2019    BENIGN LYMPH NODE WITH PARACORTICAL HYPERPLASIA   CHOLECYSTECTOMY     COLONOSCOPY N/A 01/08/2018   Procedure: COLONOSCOPY;  Surgeon: Benancio Deeds, MD;  Location: [facility];  Service: Gastroenterology;  Laterality: N/A;   LAPAROSCOPIC GASTRIC BANDING     LAPAROSCOPIC REPAIR AND REMOVAL OF GASTRIC BAND  2013-2014   Social History   Tobacco Use   Smoking status: Former    Current packs/day: 0.00    Types: Cigarettes    Quit date: 10/02/1972    Years since quitting: 50.7   Smokeless tobacco: Never  Vaping Use   Vaping status: Never Used  Substance Use Topics   Alcohol use: No   Drug use: No   Family History  Problem Relation Age of Onset   Hypertension Mother    Stroke Mother    Diabetes Father    Alcohol abuse Father    Kidney disease Father    Colon polyps Brother    Cancer - Colon Brother    Alcohol abuse Brother    Heart disease Maternal  Grandmother    Hypertension Son    Colon cancer Neg Hx    Esophageal cancer Neg Hx    Rectal cancer Neg Hx    Stomach cancer Neg Hx    No Known Allergies    ROS Negative unless stated above    Objective:     BP (!) 142/90 (BP Location: Right Arm, Patient Position: Sitting, Cuff Size: Large)   Pulse (!) 107   Temp 98.9 F (37.2 C) (Oral)   Ht 5\' 1"  (1.549 m)   Wt 264 lb 3.2 oz (119.8 kg)   SpO2 97%   BMI 49.92 kg/m  BP Readings from Last 3 Encounters:  06/06/23 (!) 142/90  05/29/23 128/80  05/03/23 124/73   Wt Readings from Last 3 Encounters:  06/06/23 264 lb 3.2 oz (119.8 kg)  05/29/23 250 lb (113.4 kg)  05/03/23 243 lb (110.2  kg)      Physical Exam Constitutional:      General: She is not in acute distress.    Appearance: Normal appearance.  HENT:     Head: Normocephalic and atraumatic.     Nose: Nose normal.     Mouth/Throat:     Mouth: Mucous membranes are moist.  Cardiovascular:     Rate and Rhythm: Regular rhythm.     Heart sounds: Normal heart sounds. No murmur heard.    No gallop.  Pulmonary:     Effort: Pulmonary effort is normal. No respiratory distress.     Breath sounds: Normal breath sounds. No wheezing, rhonchi or rales.  Abdominal:     General: Bowel sounds are normal. There is no distension.     Palpations: Abdomen is soft.     Tenderness: There is no abdominal tenderness. There is no right CVA tenderness, left CVA tenderness, guarding or rebound.  Skin:    General: Skin is warm and dry.  Neurological:     Mental Status: She is alert and oriented to person, place, and time.    Results for orders placed or performed in visit on 06/06/23  POCT Urinalysis Dipstick (Automated)  Result Value Ref Range   Color, UA yellow    Clarity, UA Clear    Glucose, UA Negative Negative   Bilirubin, UA 1+    Ketones, UA neg    Spec Grav, UA 1.025 1.010 - 1.025   Blood, UA 2+    pH, UA 5.0 5.0 - 8.0   Protein, UA Positive (A) Negative   Urobilinogen, UA 1.0 0.2 or 1.0 E.U./dL   Nitrite, UA neg    Leukocytes, UA Negative Negative      Assessment & Plan:  Hematuria, unspecified type -     POCT Urinalysis Dipstick (Automated) -     Urine Culture -     Tamsulosin HCl; Take 1 capsule (0.4 mg total) by mouth daily.  Dispense: 30 capsule; Refill: 0  History of renal calculi -     Tamsulosin HCl; Take 1 capsule (0.4 mg total) by mouth daily.  Dispense: 30 capsule; Refill: 0  Patient with hematuria.  History of known punctate nonobstructing calculus in the right kidney lower pole on CT stone study from 05/03/2023.  Likely passing another stone.  UA negative for overt signs of UTI.  Will obtain  UCX.  Increase p.o. intake of fluids and start Flomax.  Given strainer.  Given strict precautions.  Return if symptoms worsen or fail to improve.   Deeann Saint, MD

## 2023-06-06 NOTE — Patient Instructions (Addendum)
Your symptoms seem to be from kidney stone(s).  Your urine will be sent for culture to see if it grows any bacteria that may be causing a urinary tract infection.  In the meantime start Flomax and increase your intake of water and fluids in general.  We will give you a strainer to see if you can catch the stone if you pass it.  If you do we can send the stone to the lab for analysis to determine what it is made out of.  Continue to monitor for worsening symptoms including fever, chills, etc.  Follow-up with PCP or proceed to nearest ED for continued or worsening symptoms.

## 2023-06-08 LAB — URINE CULTURE
MICRO NUMBER:: 15700306
Result:: NO GROWTH
SPECIMEN QUALITY:: ADEQUATE

## 2023-06-13 ENCOUNTER — Other Ambulatory Visit: Payer: Self-pay | Admitting: Physical Medicine and Rehabilitation

## 2023-06-13 DIAGNOSIS — G609 Hereditary and idiopathic neuropathy, unspecified: Secondary | ICD-10-CM

## 2023-06-19 ENCOUNTER — Ambulatory Visit
Admission: RE | Admit: 2023-06-19 | Discharge: 2023-06-19 | Disposition: A | Discharge status: Home / Self Care | Payer: No Typology Code available for payment source | Source: Ambulatory Visit | Attending: Family Medicine | Admitting: Family Medicine

## 2023-06-19 DIAGNOSIS — Z1231 Encounter for screening mammogram for malignant neoplasm of breast: Secondary | ICD-10-CM

## 2023-06-29 ENCOUNTER — Other Ambulatory Visit: Payer: Self-pay | Admitting: Family Medicine

## 2023-06-29 DIAGNOSIS — Z87442 Personal history of urinary calculi: Secondary | ICD-10-CM

## 2023-06-29 DIAGNOSIS — R319 Hematuria, unspecified: Secondary | ICD-10-CM

## 2023-07-22 ENCOUNTER — Ambulatory Visit: Payer: No Typology Code available for payment source | Admitting: Family Medicine

## 2023-07-22 ENCOUNTER — Encounter: Payer: Self-pay | Admitting: Family Medicine

## 2023-07-22 VITALS — BP 150/90 | HR 94 | Resp 16 | Ht 61.0 in | Wt 250.4 lb

## 2023-07-22 DIAGNOSIS — E559 Vitamin D deficiency, unspecified: Secondary | ICD-10-CM

## 2023-07-22 DIAGNOSIS — F5102 Adjustment insomnia: Secondary | ICD-10-CM

## 2023-07-22 DIAGNOSIS — R7303 Prediabetes: Secondary | ICD-10-CM | POA: Diagnosis not present

## 2023-07-22 DIAGNOSIS — I10 Essential (primary) hypertension: Secondary | ICD-10-CM | POA: Diagnosis not present

## 2023-07-22 DIAGNOSIS — G4700 Insomnia, unspecified: Secondary | ICD-10-CM

## 2023-07-22 LAB — VITAMIN D 25 HYDROXY (VIT D DEFICIENCY, FRACTURES): VITD: 14.69 ng/mL — ABNORMAL LOW (ref 30.00–100.00)

## 2023-07-22 LAB — HEMOGLOBIN A1C: Hgb A1c MFr Bld: 6.3 % (ref 4.6–6.5)

## 2023-07-22 MED ORDER — SPIRONOLACTONE 25 MG PO TABS
25.0000 mg | ORAL_TABLET | Freq: Every day | ORAL | 1 refills | Status: DC
Start: 2023-07-22 — End: 2023-08-05

## 2023-07-22 NOTE — Progress Notes (Signed)
HPI: Jennifer Davila is a 67 y.o. female with a PMHx significant for HTN, peripheral neuropathy, GERD, chronic urticaria, osteopenia, HLD, iron deficiency anemia, prediabetes, and anxiety/depression, who is here today for chronic disease management.  Last seen on 05/29/2023 Since her last visit, she was seen in the office for gross hematuria, thought to be related to kidney stone. Problem has resolved.  Hypertension:  Medications: Currently on amlodipine-olmesartan 5-40 mg daily. She is not taking spironolactone 25 mg daily.  BP readings at home: She has been checking her BP regularly at home. On 12/19 it was 144/63.  Side effects: none Negative for unusual or severe headache, visual changes, exertional chest pain, dyspnea,  focal weakness, or edema.  HypoK+: She has stopped taking her potassium supplementation for about 6 months.   Lab Results  Component Value Date   CREATININE 1.00 05/03/2023   BUN 16 05/03/2023   NA 137 05/03/2023   K 3.7 05/03/2023   CL 102 05/03/2023   CO2 26 05/03/2023   Hyperlipidemia: Currently on pravastatin 20 mg daily.  Side effects from medication: none Lab Results  Component Value Date   CHOL 173 11/01/2021   HDL 40.60 11/01/2021   LDLCALC 101 (H) 11/01/2021   TRIG 155.0 (H) 11/01/2021   CHOLHDL 4 11/01/2021   Prediabetes:Negative for polydipsia,polyuria, or polyphagia.  Lab Results  Component Value Date   HGBA1C 6.1 (H) 05/30/2022   Insomnia:  She is taking trazodone 50 mg prn for sleep. Without the medication, she only sleeps 3 hours. When she does take it, she gets about 9 hours with a bathroom break at 2 am. She feels rested after sleeping with the medication.  No side effects reported. Still dealing with grief after her husband passed away. She does not have close family but has friends that check on her periodically.  Chronic LE's pain:  She is following with a chronic pain provider. Her next appointment is in January/2025.   She also mentions she has been having some balance issues for the past few months, no falls.  She has tried different medications, they did not help much. Ibuprofen 400 mg has been the most effective treatment, she just takes med Sundays ,when she has church. Hx of GI bleed, diverticulitis. She denies blood in her stool , melena,or changes in bowel  movements.  Obesity: She asks today about weight loss medications because she believes that may be worsening her leg pain. She tried phentermine in the past, gained wt back after stopping medication. She is receptive to seeing the weight loss clinic.  Diet: She has been eating more due to stress since her husband passed.  Exercise: She admits she has not been exercising.   Vit D deficiency: She is not currently taking vitamin D supplementation.   She also would like right ear checked, a few days ago she was having mild earache. No recent URI or travel.  Review of Systems  Constitutional:  Positive for appetite change and fatigue. Negative for activity change and fever.  HENT:  Negative for mouth sores, nosebleeds, sore throat and trouble swallowing.   Respiratory:  Negative for cough and wheezing.   Gastrointestinal:  Negative for abdominal pain, nausea and vomiting.       Negative for changes in bowel habits.  Endocrine: Negative for cold intolerance and heat intolerance.  Genitourinary:  Negative for decreased urine volume, dysuria and hematuria.  Neurological:  Negative for syncope and facial asymmetry.  Psychiatric/Behavioral:  Negative for confusion and  hallucinations. The patient is nervous/anxious.   See other pertinent positives and negatives in HPI.  Current Outpatient Medications on File Prior to Visit  Medication Sig Dispense Refill   acetaminophen (TYLENOL) 500 MG tablet Take 1-2 tablets (500-1,000 mg total) by mouth daily as needed for moderate pain (pain). 60 tablet 1   amLODipine-olmesartan (AZOR) 5-40 MG tablet Take 1  tablet by mouth daily. 90 tablet 2   b complex vitamins capsule Take vitamin B complex three times daily, make sure the complex is containing at least 30 mg of vitamin B6 (Patient not taking: Reported on 06/06/2023) 80 capsule 6   calcium carbonate (OSCAL) 1500 (600 Ca) MG TABS tablet Take 1 tablet by mouth 2 (two) times daily with a meal.     capsaicin topical system (QUTENZA, 4 PATCH,) 8 % APPLY 4 PATCHES TOPICALLY. PLACE ON AFFECTED AREA OF SKIN, EVERY 3 MONTHS AS NEEDED FOR NEUROPATHY. 4 patch 0   Ferrous Sulfate (IRON) 325 (65 Fe) MG TABS Take one tablet every other day. (Patient not taking: Reported on 06/06/2023) 15 tablet 0   FLUoxetine (PROZAC) 20 MG capsule Take 1 capsule (20 mg total) by mouth daily. (Patient not taking: Reported on 06/06/2023) 90 capsule 3   ibuprofen (ADVIL) 400 MG tablet Take 1 tablet (400 mg total) by mouth once a week. 30 tablet 0   methocarbamol (ROBAXIN-750) 750 MG tablet Take 1 tablet (750 mg total) by mouth every 6 (six) hours as needed for muscle spasms (posterior thigh pain). 90 tablet 2   omeprazole (PRILOSEC) 20 MG capsule Take 1 capsule (20 mg total) by mouth daily. 90 capsule 3   ondansetron (ZOFRAN-ODT) 4 MG disintegrating tablet Take 1 tablet (4 mg total) by mouth every 4 (four) hours as needed. 20 tablet 0   oxyCODONE-acetaminophen (PERCOCET/ROXICET) 5-325 MG tablet Take 1 tablet by mouth every 6 (six) hours as needed for severe pain. 20 tablet 0   polyethylene glycol (MIRALAX / GLYCOLAX) 17 g packet Take 17 g by mouth every other day.     pravastatin (PRAVACHOL) 20 MG tablet TAKE 1 TABLET BY MOUTH EVERY DAY 90 tablet 3   spironolactone (ALDACTONE) 25 MG tablet Take 1 tablet (25 mg total) by mouth daily. 90 tablet 1   tamsulosin (FLOMAX) 0.4 MG CAPS capsule Take 1 capsule (0.4 mg total) by mouth daily. 30 capsule 0   traZODone (DESYREL) 50 MG tablet TAKE 1/2 TO 1 TABLET (25 TO 50 MG TOTAL) BY MOUTH AT BEDTIME 30 tablet 2   No current facility-administered  medications on file prior to visit.    Past Medical History:  Diagnosis Date   Arthritis    Constipation    on stool softener   GERD (gastroesophageal reflux disease)    Hyperlipidemia    Hypertension    Kidney stones    Trouble swallowing 2013   following lap band   Urticaria    No Known Allergies  Social History   Socioeconomic History   Marital status: Married    Spouse name: Not on file   Number of children: 1   Years of education: Not on file   Highest education level: Not on file  Occupational History   Occupation: caregiver  Tobacco Use   Smoking status: Former    Current packs/day: 0.00    Types: Cigarettes    Quit date: 10/02/1972    Years since quitting: 50.8   Smokeless tobacco: Never  Vaping Use   Vaping status: Never Used  Substance  and Sexual Activity   Alcohol use: No   Drug use: No   Sexual activity: Never  Other Topics Concern   Not on file  Social History Narrative   Not on file   Social Drivers of Health   Financial Resource Strain: Low Risk  (02/22/2023)   Overall Financial Resource Strain (CARDIA)    Difficulty of Paying Living Expenses: Not hard at all  Food Insecurity: No Food Insecurity (02/22/2023)   Hunger Vital Sign    Worried About Running Out of Food in the Last Year: Never true    Ran Out of Food in the Last Year: Never true  Transportation Needs: No Transportation Needs (02/22/2023)   PRAPARE - Administrator, Civil Service (Medical): No    Lack of Transportation (Non-Medical): No  Physical Activity: Inactive (02/22/2023)   Exercise Vital Sign    Days of Exercise per Week: 0 days    Minutes of Exercise per Session: 0 min  Stress: No Stress Concern Present (02/22/2023)   [employer] of Occupational Health - Occupational Stress Questionnaire    Feeling of Stress : Not at all  Social Connections: Moderately Integrated (02/22/2023)   Social Connection and Isolation Panel [NHANES]    Frequency of Communication with  Friends and Family: More than three times a week    Frequency of Social Gatherings with Friends and Family: More than three times a week    Attends Religious Services: More than 4 times per year    Active Member of [employer] or Organizations: Yes    Attends Banker Meetings: More than 4 times per year    Marital Status: Widowed   Today's Vitals   07/22/23 1037 07/22/23 1102  BP: (!) 148/96 (!) 150/90  Pulse: 94   Resp: 16   SpO2: 97%   Weight: 250 lb 6 oz (113.6 kg)   Height: 5\' 1"  (1.549 m)    Body mass index is 47.31 kg/m.  Physical Exam Vitals and nursing note reviewed.  Constitutional:      General: She is not in acute distress.    Appearance: She is well-developed.  HENT:     Head: Normocephalic and atraumatic.     Right Ear: Tympanic membrane, ear canal and external ear normal.     Left Ear: Tympanic membrane, ear canal and external ear normal.     Mouth/Throat:     Mouth: Mucous membranes are moist.     Pharynx: Oropharynx is clear.  Eyes:     Conjunctiva/sclera: Conjunctivae normal.  Cardiovascular:     Rate and Rhythm: Normal rate and regular rhythm.     Pulses:          Dorsalis pedis pulses are 2+ on the right side and 2+ on the left side.     Heart sounds: No murmur heard.    Comments: Non-pitting bilateral LE edema Pulmonary:     Effort: Pulmonary effort is normal. No respiratory distress.     Breath sounds: Normal breath sounds.  Abdominal:     Palpations: Abdomen is soft. There is no mass.  Musculoskeletal:     Right lower leg: Edema present.     Left lower leg: Edema present.  Lymphadenopathy:     Cervical: No cervical adenopathy.  Skin:    General: Skin is warm.     Findings: No erythema or rash.  Neurological:     General: No focal deficit present.     Mental Status: She is alert  and oriented to person, place, and time.     Cranial Nerves: No cranial nerve deficit.     Comments: Mildly antalgic gait, not assisted.  Psychiatric:         Mood and Affect: Affect is labile.     Comments: Well groomed, good eye contact.    ASSESSMENT AND PLAN:  Ms. Breon was seen today for chronic disease management.  Lab Results  Component Value Date   HGBA1C 6.3 07/22/2023   Lab Results  Component Value Date   VD25OH 14.69 (L) 07/22/2023   Prediabetes Consistency with a healthier life style encouraged for diabetes prevention. Last HgA1C was 6.1 in 05/2022.  -     Hemoglobin A1c; Future  Insomnia, unspecified type Trazodone has helped, no changes in dose. Some side effects dicussed. Good sleep hygiene also recommended.  Morbid obesity (HCC) Patient understands the benefits of wt loss as well as adverse effects of obesity. Consistency with healthy diet and physical activity encouraged. Making positive life style changes, small steps at the time, will provide long lasting results. Referral to Healthy weight and wellness clinic placed.  -     Amb Ref to Medical Weight Management  Hypertension, essential, benign BP is not well controlled. Possible complications of elevated BP discussed. Changes today:Resume Spironolactone 25 mg daily. Continue Amlodipine-Olmesartan 5-40 mg daily. Low salt diet recommended. Monitor BP at home and let me know about readings in 3-4 weeks. BMP in 3-4 weeks.. Instructed about warning signs. Follow-up in 6 months..  -     Spironolactone; Take 1 tablet (25 mg total) by mouth daily.  Dispense: 90 tablet; Refill: 1  Vitamin D deficiency, unspecified She is not on Vit D supplementation. Recommendations will be given according to 25 OH vit D result.  -     VITAMIN D 25 Hydroxy (Vit-D Deficiency, Fractures); Future  Return in about 6 months (around 01/20/2024) for chronic problems. Labs in 4 weeks.Trula Ore, acting as a scribe for Cleofas Hudgins [place], MD., have documented all relevant documentation on the behalf of Geovannie Vilar [place], MD, as directed by  Vanisha Whiten [place], MD while in the presence  of Leigha Olberding [place], MD.   I, Suanne Marker, have reviewed all documentation for this visit. The documentation on 07/22/23 for the exam, diagnosis, procedures, and orders are all accurate and complete.  Alira Fretwell G. [place], MD  [facility]. [place] office.

## 2023-07-22 NOTE — Patient Instructions (Addendum)
A few things to remember from today's visit:  Prediabetes - Plan: Hemoglobin A1c  Hypertension, essential, benign - Plan: spironolactone (ALDACTONE) 25 MG tablet  Vitamin D deficiency, unspecified - Plan: VITAMIN D 25 Hydroxy (Vit-D Deficiency, Fractures)  Resume Spironolactone 25 mg daily. No changes in rest. Labs in 4 weeks with blood pressure readings. Ideal blood pressure 130/80 or less.  If you need refills for medications you take chronically, please call your pharmacy. Do not use My Chart to request refills or for acute issues that need immediate attention. If you send a my chart message, it may take a few days to be addressed, specially if I am not in the office.  Please be sure medication list is accurate. If a new problem present, please set up appointment sooner than planned today.

## 2023-07-25 MED ORDER — VITAMIN D (ERGOCALCIFEROL) 1.25 MG (50000 UNIT) PO CAPS: 50000.0000 [IU] | ORAL_CAPSULE | ORAL | 0 refills | Status: DC

## 2023-08-01 ENCOUNTER — Other Ambulatory Visit: Payer: Self-pay | Admitting: Family Medicine

## 2023-08-01 ENCOUNTER — Telehealth: Payer: Self-pay | Admitting: *Deleted

## 2023-08-01 DIAGNOSIS — E559 Vitamin D deficiency, unspecified: Secondary | ICD-10-CM

## 2023-08-01 DIAGNOSIS — I10 Essential (primary) hypertension: Secondary | ICD-10-CM

## 2023-08-01 NOTE — Telephone Encounter (Signed)
 Copied from CRM [id number]. Topic: Clinical - Medication Refill >> Aug 01, 2023  3:01 PM Drema MATSU wrote: Most Recent Primary Care Visit:  Provider: [name], [name]  Department: [facility]  Visit Type: OFFICE VISIT  Date: 07/22/2023  Medication: Vitamin D , Ergocalciferol , (DRISDOL ) 1.25 MG (50000 UNIT) CAPS capsule spironolactone  (ALDACTONE ) 25 MG tablet   Has the patient contacted their pharmacy?  (Agent: If no, request that the patient contact the pharmacy for the refill. If patient does not wish to contact the pharmacy document the reason why and proceed with request.) (Agent: If yes, when and what did the pharmacy advise?)  Is this the correct pharmacy for this prescription?  If no, delete pharmacy and type the correct one.  This is the patient's preferred pharmacy:  [id number] [place], [place] - [address] [place] [address] Phone: [phone] Fax: [phone]  [facility] - [place], [place] - [address] [place] [address] [place] [address] Phone: [phone] Fax: [phone]  [facility] [phone] - [place], [place] - [address] AT [facility] [address] [place] [address] Phone: [phone] Fax: [phone]   Has the prescription been filled recently?   Is the patient out of the medication?   Has the patient been seen for an appointment in the last year OR does the patient have an upcoming appointment?   Can we respond through MyChart?   Agent: Please be advised that Rx refills may take up to 3 business days. We ask that you follow-up with your pharmacy.

## 2023-08-01 NOTE — Telephone Encounter (Signed)
 Copied from CRM [id number]. Topic: Clinical - Medication Refill >> Aug 01, 2023  3:01 PM Drema MATSU wrote: Most Recent Primary Care Visit:  Provider: [name], [name]  Department: [facility]  Visit Type: OFFICE VISIT  Date: 07/22/2023  Medication: Vitamin D , Ergocalciferol , (DRISDOL ) 1.25 MG (50000 UNIT) CAPS capsule spironolactone  (ALDACTONE ) 25 MG tablet   Has the patient contacted their pharmacy?  Yes (Agent: If no, request that the patient contact the pharmacy for the refill. If patient does not wish to contact the pharmacy document the reason why and proceed with request.) (Agent: If yes, when and what did the pharmacy advise?)  Is this the correct pharmacy for this prescription? Yes If no, delete pharmacy and type the correct one.  This is the patient's preferred pharmacy:  [id number] [place], [place] - [address]  [place] [address] Phone: [phone] Fax: [phone]   Has the prescription been filled recently? No  Is the patient out of the medication? Yes Dr wants patient to get back on it because she wasn't taking it  Has the patient been seen for an appointment in the last year OR does the patient have an upcoming appointment? Yes  Can we respond through MyChart? No  Agent: Please be advised that Rx refills may take up to 3 business days. We ask that you follow-up with your pharmacy.

## 2023-08-05 MED ORDER — VITAMIN D (ERGOCALCIFEROL) 1.25 MG (50000 UNIT) PO CAPS: 50000.0000 [IU] | ORAL_CAPSULE | ORAL | 0 refills | Status: AC

## 2023-08-05 MED ORDER — SPIRONOLACTONE 25 MG PO TABS: 25.0000 mg | ORAL_TABLET | Freq: Every day | ORAL | 1 refills | Status: DC

## 2023-08-17 ENCOUNTER — Encounter (HOSPITAL_COMMUNITY): Payer: Self-pay

## 2023-08-17 ENCOUNTER — Other Ambulatory Visit: Payer: Self-pay

## 2023-08-17 ENCOUNTER — Emergency Department (HOSPITAL_COMMUNITY)
Admission: EM | Admit: 2023-08-17 | Discharge: 2023-08-17 | Disposition: A | Discharge status: Home / Self Care | Payer: 59 | Attending: Emergency Medicine | Admitting: Emergency Medicine

## 2023-08-17 ENCOUNTER — Emergency Department (HOSPITAL_COMMUNITY): Payer: 59

## 2023-08-17 DIAGNOSIS — H9209 Otalgia, unspecified ear: Secondary | ICD-10-CM | POA: Diagnosis not present

## 2023-08-17 DIAGNOSIS — I10 Essential (primary) hypertension: Secondary | ICD-10-CM | POA: Insufficient documentation

## 2023-08-17 DIAGNOSIS — R42 Dizziness and giddiness: Secondary | ICD-10-CM | POA: Diagnosis not present

## 2023-08-17 DIAGNOSIS — Z79899 Other long term (current) drug therapy: Secondary | ICD-10-CM | POA: Insufficient documentation

## 2023-08-17 DIAGNOSIS — R059 Cough, unspecified: Secondary | ICD-10-CM | POA: Insufficient documentation

## 2023-08-17 DIAGNOSIS — R6889 Other general symptoms and signs: Secondary | ICD-10-CM | POA: Diagnosis not present

## 2023-08-17 LAB — URINALYSIS, ROUTINE W REFLEX MICROSCOPIC
Bilirubin Urine: NEGATIVE
Glucose, UA: NEGATIVE mg/dL
Hgb urine dipstick: NEGATIVE
Ketones, ur: NEGATIVE mg/dL
Leukocytes,Ua: NEGATIVE
Nitrite: NEGATIVE
Protein, ur: NEGATIVE mg/dL
Specific Gravity, Urine: 1.011 (ref 1.005–1.030)
pH: 7 (ref 5.0–8.0)

## 2023-08-17 LAB — CBC WITH DIFFERENTIAL/PLATELET
Abs Immature Granulocytes: 0.01 10*3/uL (ref 0.00–0.07)
Basophils Absolute: 0 10*3/uL (ref 0.0–0.1)
Basophils Relative: 1 %
Eosinophils Absolute: 0.2 10*3/uL (ref 0.0–0.5)
Eosinophils Relative: 4 %
HCT: 41.4 % (ref 36.0–46.0)
Hemoglobin: 12.7 g/dL (ref 12.0–15.0)
Immature Granulocytes: 0 %
Lymphocytes Relative: 25 %
Lymphs Abs: 1.2 10*3/uL (ref 0.7–4.0)
MCH: 22.9 pg — ABNORMAL LOW (ref 26.0–34.0)
MCHC: 30.7 g/dL (ref 30.0–36.0)
MCV: 74.6 fL — ABNORMAL LOW (ref 80.0–100.0)
Monocytes Absolute: 0.3 10*3/uL (ref 0.1–1.0)
Monocytes Relative: 7 %
Neutro Abs: 3 10*3/uL (ref 1.7–7.7)
Neutrophils Relative %: 63 %
Platelets: 268 10*3/uL (ref 150–400)
RBC: 5.55 MIL/uL — ABNORMAL HIGH (ref 3.87–5.11)
RDW: 14.3 % (ref 11.5–15.5)
WBC: 4.7 10*3/uL (ref 4.0–10.5)
nRBC: 0 % (ref 0.0–0.2)

## 2023-08-17 LAB — COMPREHENSIVE METABOLIC PANEL
ALT: 10 U/L (ref 0–44)
AST: 18 U/L (ref 15–41)
Albumin: 4 g/dL (ref 3.5–5.0)
Alkaline Phosphatase: 102 U/L (ref 38–126)
Anion gap: 10 (ref 5–15)
BUN: 11 mg/dL (ref 8–23)
CO2: 23 mmol/L (ref 22–32)
Calcium: 8.9 mg/dL (ref 8.9–10.3)
Chloride: 103 mmol/L (ref 98–111)
Creatinine, Ser: 0.93 mg/dL (ref 0.44–1.00)
GFR, Estimated: >60 mL/min (ref >60)
Glucose, Bld: 97 mg/dL (ref 70–99)
Potassium: 4.1 mmol/L (ref 3.5–5.1)
Sodium: 136 mmol/L (ref 135–145)
Total Bilirubin: 0.4 mg/dL (ref 0.0–1.2)
Total Protein: 7.6 g/dL (ref 6.5–8.1)

## 2023-08-17 NOTE — ED Triage Notes (Addendum)
Pt BIB EMS from home with reports of dizziness x 1 month (more with position changes). Pt also has right ear pain and tinnitus.

## 2023-08-17 NOTE — Discharge Instructions (Signed)
Please follow-up with primary care to ensure resolution of symptoms.  Please make sure to stay well-hydrated at home drinking primarily water you can alternate any sugar-free Gatorade or Pedialyte during.  Make sure you are eating regular meals.  Please return to emergency room with any new or worsening symptoms.

## 2023-08-17 NOTE — ED Provider Notes (Signed)
[place] EMERGENCY DEPARTMENT AT [facility] Provider Note   CSN: [id number] Arrival date & time: 08/17/23  1223     History  Chief Complaint  Patient presents with   Dizziness    Jennifer Davila is a 68 y.o. female with past medical history of hypertension, hyperlipidemia, GERD, iron deficiency anemia presenting to the emergency room after feeling lightheaded this morning.  Patient reports she was sitting down when she started feeling very flushed and then felt lightheaded.  Patient did not feel like the room was spinning or feel like it was vertigo.  Patient reports this lasted for about 5 seconds and started to improve.  She does note that 2 days ago her right ear started hurting and has been associated with ringing. Has had cough for 3-4 days. No fevers, chills, chest pain, shortness of breath, abdominal pain, NVD.    Dizziness      Home Medications Prior to Admission medications   Medication Sig Start Date End Date Taking? Authorizing Provider  acetaminophen (TYLENOL) 500 MG tablet Take 1-2 tablets (500-1,000 mg total) by mouth daily as needed for moderate pain (pain). 03/22/21   [name], MD  amLODipine-olmesartan (AZOR) 5-40 MG tablet Take 1 tablet by mouth daily. 05/29/23   [place], [name], MD  b complex vitamins capsule Take vitamin B complex three times daily, make sure the complex is containing at least 30 mg of vitamin B6 09/05/22   Elijah Birk C, DO  calcium carbonate (OSCAL) 1500 (600 Ca) MG TABS tablet Take 1 tablet by mouth 2 (two) times daily with a meal.    [provider]  capsaicin topical system (QUTENZA, 4 PATCH,) 8 % APPLY 4 PATCHES TOPICALLY. PLACE ON AFFECTED AREA OF SKIN, EVERY 3 MONTHS AS NEEDED FOR NEUROPATHY. 06/13/23   [name], DO  Ferrous Sulfate (IRON) 325 (65 Fe) MG TABS Take one tablet every other day. 09/05/22   Elijah Birk C, DO  ibuprofen (ADVIL) 400 MG tablet Take 1 tablet (400 mg total) by mouth once a  week. 05/29/23   [place], [name], MD  methocarbamol (ROBAXIN-750) 750 MG tablet Take 1 tablet (750 mg total) by mouth every 6 (six) hours as needed for muscle spasms (posterior thigh pain). 12/05/22   [name], DO  omeprazole (PRILOSEC) 20 MG capsule Take 1 capsule (20 mg total) by mouth daily. 09/12/22   Armbruster, [name], MD  ondansetron (ZOFRAN-ODT) 4 MG disintegrating tablet Take 1 tablet (4 mg total) by mouth every 4 (four) hours as needed. 05/03/23   Arby Barrette, MD  oxyCODONE-acetaminophen (PERCOCET/ROXICET) 5-325 MG tablet Take 1 tablet by mouth every 6 (six) hours as needed for severe pain. 05/03/23   Arby Barrette, MD  polyethylene glycol (MIRALAX / GLYCOLAX) 17 g packet Take 17 g by mouth every other day.    [provider]  pravastatin (PRAVACHOL) 20 MG tablet TAKE 1 TABLET BY MOUTH EVERY DAY 10/29/22   [place], [name], MD  spironolactone (ALDACTONE) 25 MG tablet Take 1 tablet (25 mg total) by mouth daily. 08/05/23   [place], [name], MD  tamsulosin (FLOMAX) 0.4 MG CAPS capsule Take 1 capsule (0.4 mg total) by mouth daily. 06/06/23   Deeann Saint, MD  traZODone (DESYREL) 50 MG tablet TAKE 1/2 TO 1 TABLET (25 TO 50 MG TOTAL) BY MOUTH AT BEDTIME 05/27/23   [place], [name], MD  Vitamin D, Ergocalciferol, (DRISDOL) 1.25 MG (50000 UNIT) CAPS capsule Take 1 capsule (  50,000 Units total) by mouth every 7 (seven) days for 8 doses. 08/05/23 09/24/23  [place], [name], MD      Allergies    Patient has no known allergies.    Review of Systems   Review of Systems  Neurological:  Positive for dizziness.    Physical Exam Updated Vital Signs BP (!) 155/103 (BP Location: Right Arm)   Pulse 81   Temp 98.6 F (37 C) (Oral)   Resp 16   Ht 5\' 1"  (1.549 m)   Wt 113.6 kg   SpO2 94%   BMI 47.32 kg/m  Physical Exam Vitals and nursing note reviewed.  Constitutional:      General: She is not in acute distress.    Appearance: She is not toxic-appearing.  HENT:     Head:  Normocephalic and atraumatic.  Eyes:     General: No scleral icterus.    Conjunctiva/sclera: Conjunctivae normal.  Cardiovascular:     Rate and Rhythm: Normal rate and regular rhythm.     Pulses: Normal pulses.     Heart sounds: Normal heart sounds.  Pulmonary:     Effort: Pulmonary effort is normal. No respiratory distress.     Breath sounds: Normal breath sounds. No stridor. No wheezing or rhonchi.  Abdominal:     General: Abdomen is flat. Bowel sounds are normal.     Palpations: Abdomen is soft.     Tenderness: There is no abdominal tenderness.  Musculoskeletal:     Right lower leg: No edema.     Left lower leg: No edema.  Skin:    General: Skin is warm and dry.     Capillary Refill: Capillary refill takes less than 2 seconds.     Findings: No lesion.  Neurological:     General: No focal deficit present.     Mental Status: She is alert and oriented to person, place, and time. Mental status is at baseline.     Comments: Alert and oriented answering questions appropriately with no slurred speech.  No nystagmus pupils, equal and reactive.  Strength of upper and lower extremity equal bilaterally no change in sensation.     ED Results / Procedures / Treatments   Labs (all labs ordered are listed, but only abnormal results are displayed) Labs Reviewed  CBC WITH DIFFERENTIAL/PLATELET - Abnormal; Notable for the following components:      Result Value   RBC 5.55 (*)    MCV 74.6 (*)    MCH 22.9 (*)    All other components within normal limits  URINALYSIS, ROUTINE W REFLEX MICROSCOPIC  COMPREHENSIVE METABOLIC PANEL    EKG EKG Interpretation Date/Time:  Saturday August 17 2023 12:33:53 EST Ventricular Rate:  72 PR Interval:  159 QRS Duration:  83 QT Interval:  395 QTC Calculation: 433 R Axis:   -18  Text Interpretation: Sinus rhythm Borderline left axis deviation Low voltage, precordial leads Confirmed by Vivi Barrack [phone]) on 08/17/2023 1:31:16 PM  Radiology DG  Chest 2 View Result Date: 08/17/2023 CLINICAL DATA:  Dizziness for 1 month.  Right ear pain and tinnitus. EXAM: CHEST - 2 VIEW COMPARISON:  04/27/2015. FINDINGS: Cardiac silhouette is normal in size and configuration. No mediastinal or hilar masses. No evidence of adenopathy. Clear lungs.  No pleural effusion or pneumothorax. Skeletal structures are intact. IMPRESSION: No active cardiopulmonary disease. Electronically Signed   By: Amie Portland M.D.   On: 08/17/2023 13:56    Procedures Procedures    Medications Ordered in ED  Medications - No data to display  ED Course/ Medical Decision Making/ A&P                                 Medical Decision Making Amount and/or Complexity of Data Reviewed Labs: ordered. Radiology: ordered.   This patient presents to the ED for concern of dizziness, this involves an extensive number of treatment options, and is a complaint that carries with it a high risk of complications and morbidity.  The differential diagnosis includes duration, electrolyte abnormality, arrhythmia, infectious etiology, vertigo, CVA   Co morbidities that complicate the patient evaluation  Syndrome, hyperlipidemia    Lab Tests:  I personally interpreted labs.  The pertinent results include:   BC with no leukocytosis, no anemia.  CMP without significant electrolyte abnormality, no anion gap elevation in LFTs.  Urinalysis with no ketones, no nitrites no leukocytes   Imaging Studies ordered:  I ordered imaging studies including chest x-ray   I independently visualized and interpreted imaging which showed No acute pathology  I agree with the radiologist interpretation   Cardiac Monitoring: / EKG:  The patient was maintained on a cardiac monitor.  I personally viewed and interpreted the cardiac monitored which showed an underlying rhythm of: sinus    Consultations Obtained:  None    Problem List / ED Course / Critical interventions / Medication  management  Patient presented to emergency room with complaint of episode of near syncope.  Patient reports she has been under a lot of stress and not been eating and drinking regularly.  Patient feels back to normal.  On exam she has no sign of acute otitis media or acute otitis externa.  Does not have any infectious symptoms.  Alert oriented answering questions appropriately with no neurological deficit.  Vitals are stable throughout stay.  Patient denies any chest pain thus doubt this is secondary to ACS or pulmonary embolism.  Has reassuring chest x-ray with no sign of pneumonia or pneumothorax.  No electrolyte abnormality or sign of dehydration on lab work. Given that patient has not staying well hydrated - feel it is likely related to lack of regular hydration. Will discuss follow up resources. Dr. Jearld Fenton to see the patient who reviewed labs and workup fingers patient stable for discharge. Offered medication, patient declined.  I have reviewed the patients home medicines and have made adjustments as needed   Plan  F/u w/ PCP in 2-3d to ensure resolution of sx.  Patient was given return precautions. Patient stable for discharge at this time.  Patient educated on sx/dx and verbalized understanding of plan. Return to ER w/ new or worsening sx.          Final Clinical Impression(s) / ED Diagnoses Final diagnoses:  Dizziness    Rx / DC Orders ED Discharge Orders     None         Smitty Knudsen, PA-C 08/17/23 1521    Loetta Rough, MD 08/18/23 [phone]

## 2023-08-23 ENCOUNTER — Telehealth: Payer: Self-pay | Admitting: *Deleted

## 2023-08-23 DIAGNOSIS — I10 Essential (primary) hypertension: Secondary | ICD-10-CM

## 2023-08-23 DIAGNOSIS — R42 Dizziness and giddiness: Secondary | ICD-10-CM

## 2023-08-23 NOTE — Progress Notes (Signed)
Transition Care Management Unsuccessful Follow-up Telephone Call  Date of discharge and from where:  [facility]  08/17/2023  Attempts:  1st Attempt  Reason for unsuccessful TCM follow-up call:  Left voice message

## 2023-08-23 NOTE — Progress Notes (Signed)
Complex Care Management Note Care Guide Note  08/23/2023 Name: Jennifer Davila MRN: [id number] DOB: 01/22/1956   Complex Care Management Outreach Attempts: An unsuccessful telephone outreach was attempted today to offer the patient information about available complex care management services.  Follow Up Plan:  Additional outreach attempts will be made to offer the patient complex care management information and services.   Encounter Outcome:  No Answer  Burman Nieves, CMA, Care Guide [facility]  [facility], [facility] Guide Direct Dial: [phone]  Fax: [phone] Website: [email or web address]

## 2023-08-26 ENCOUNTER — Telehealth: Payer: Self-pay | Admitting: *Deleted

## 2023-08-26 NOTE — Progress Notes (Signed)
Transition Care Management Unsuccessful Follow-up Telephone Call  Date of discharge and from where:  [facility]  08/17/2023  Attempts:  2nd Attempt  Reason for unsuccessful TCM follow-up call:  No answer/busy

## 2023-08-27 NOTE — Progress Notes (Signed)
Complex Care Management Note Care Guide Note  08/27/2023 Name: Jennifer Davila MRN: [id number] DOB: 1956-02-11   Complex Care Management Outreach Attempts: A second unsuccessful outreach was attempted today to offer the patient with information about available complex care management services.  Follow Up Plan:  Additional outreach attempts will be made to offer the patient complex care management information and services.   Encounter Outcome:  No Answer  Burman Nieves, CMA, Care Guide [facility]  [facility], [facility] Guide Direct Dial: [phone]  Fax: [phone] Website: [email or web address]

## 2023-08-29 ENCOUNTER — Other Ambulatory Visit (INDEPENDENT_AMBULATORY_CARE_PROVIDER_SITE_OTHER): Payer: 59

## 2023-08-29 DIAGNOSIS — I10 Essential (primary) hypertension: Secondary | ICD-10-CM | POA: Diagnosis not present

## 2023-08-29 LAB — BASIC METABOLIC PANEL
BUN: 20 mg/dL (ref 6–23)
CO2: 29 meq/L (ref 19–32)
Calcium: 9.2 mg/dL (ref 8.4–10.5)
Chloride: 102 meq/L (ref 96–112)
Creatinine, Ser: 1.03 mg/dL (ref 0.40–1.20)
GFR: 55.35 mL/min — ABNORMAL LOW (ref >60.00)
Glucose, Bld: 112 mg/dL — ABNORMAL HIGH (ref 70–99)
Potassium: 3.9 meq/L (ref 3.5–5.1)
Sodium: 138 meq/L (ref 135–145)

## 2023-08-30 NOTE — Progress Notes (Signed)
Complex Care Management Note Care Guide Note  08/30/2023 Name: Jennifer Davila MRN: [id number] DOB: November 26, 1955   Complex Care Management Outreach Attempts: A third unsuccessful outreach was attempted today to offer the patient with information about available complex care management services.  Follow Up Plan:  No further outreach attempts will be made at this time. We have been unable to contact the patient to offer or enroll patient in complex care management services.  Encounter Outcome:  No Answer  Burman Nieves, CMA, Care Guide [facility]  [facility], [facility] Guide Direct Dial: [phone]  Fax: [phone] Website: [email or web address]

## 2023-09-02 ENCOUNTER — Ambulatory Visit: Payer: Self-pay | Admitting: Family Medicine

## 2023-09-02 ENCOUNTER — Encounter: Payer: Self-pay | Admitting: Family Medicine

## 2023-09-02 NOTE — Telephone Encounter (Signed)
Pt calling for lab results, no additional questions or concerns.  Copied from CRM [id number]. Topic: Clinical - Lab/Test Results >> Sep 02, 2023  2:07 PM Leavy Cella D wrote: Reason for CRM: Patient has question regarding labs she had done on 1/30 Reason for Disposition  Health Information question, no triage required and triager able to answer question  Answer Assessment - Initial Assessment Questions 1. REASON FOR CALL or QUESTION: "What is your reason for calling today?" or "How can I best help you?" or "What question do you have that I can help answer?"     Pt calling to follow up on lab results  Protocols used: Information Only Call - No Triage-A-AH

## 2023-09-02 NOTE — Telephone Encounter (Signed)
 Noted

## 2023-09-03 ENCOUNTER — Ambulatory Visit (INDEPENDENT_AMBULATORY_CARE_PROVIDER_SITE_OTHER): Payer: 59 | Admitting: Family Medicine

## 2023-09-03 ENCOUNTER — Encounter (INDEPENDENT_AMBULATORY_CARE_PROVIDER_SITE_OTHER): Payer: Self-pay | Admitting: Family Medicine

## 2023-09-03 VITALS — BP 164/102 | HR 73 | Temp 98.1°F | Ht 61.0 in | Wt 248.0 lb

## 2023-09-03 DIAGNOSIS — I10 Essential (primary) hypertension: Secondary | ICD-10-CM | POA: Diagnosis not present

## 2023-09-03 DIAGNOSIS — R7303 Prediabetes: Secondary | ICD-10-CM | POA: Diagnosis not present

## 2023-09-03 DIAGNOSIS — Z6841 Body Mass Index (BMI) 40.0 and over, adult: Secondary | ICD-10-CM | POA: Diagnosis not present

## 2023-09-03 DIAGNOSIS — Z0289 Encounter for other administrative examinations: Secondary | ICD-10-CM

## 2023-09-03 NOTE — Progress Notes (Signed)
 Jennifer DOROTHA Jenkins, DO, ABFM, ABOM Bariatric physician [address] [place], [place], [place] [address] Office: [phone]  /  Fax: [phone]     Initial Evaluation:  Jennifer Davila was seen in clinic today to evaluate for obesity. She is interested in losing weight to improve overall health and reduce the risk of weight related complications. She presents today to review program treatment options, initial physical assessment, and evaluation.      She was referred by: PCP  When asked how has your weight affected you? She states: Contributed to orthopedic problems or mobility issues, Having fatigue, and Having poor endurance  Contributing factors to her weight change: Family history of obesity, Moderate to high levels of stress, eating unhealthy foods/eating out, and a life event (passing of her husband of 24 years in June 2024).   Some associated conditions: Hypertension, Arthritis, Hyperlipidemia, Prediabetes, and GERD  Current nutrition plan: None Pt has been eating out often since her husband's passing.   Current level of physical activity: None  Current or previous pharmacotherapy: None  Response to medication: Never tried medications   Barriers to weight loss that patient expresses a concern about today: multiple competing priorities, lack of partner or family support, having difficulty focusing on healthy eating, low volume of physical activity at present , orthopedic problems, medical conditions or chronic pain affecting mobility, medical comorbidities, moderate to high levels of stress, and emotional eating.   What pt hopes to accomplish: Adopt healthier eating patterns, improve existing medical conditions, and reduce number of medications.    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  Visceral Fat %: 20 ,  Body Fat %: 52.3  Vitals Temp: 98.1 F (36.7 C) BP: (!) 164/102 Pulse Rate: 73 SpO2: 98 %   Anthropometric Measurements Height: 5' 1 (1.549 m) Weight: 248 lb (112.5 kg) BMI (Calculated): 46.88 Peak Weight: 250lb   Body Composition  Body Fat %: 52.3 % Fat Mass (lbs): 129.8 lbs Muscle Mass (lbs): 112.4 lbs Total Body Water (lbs): 88.8 lbs Visceral Fat Rating : 20   Other Clinical Data Comments: info session    General: Well Developed, well nourished, and in no acute distress.  HEENT: Normocephalic, atraumatic; EOMI, sclerae are anicteric. Skin: Warm and dry, good turgor Chest:  Normal excursion, shape, no gross ABN Respiratory: No conversational dyspnea; speaking in full sentences NeuroM-Sk:  Normal gross ROM * 4 extremities  Psych: A and O *3, insight adequate, mood- full    Assessment and Plan:   FOR THE DISEASE OF OBESITY: Morbid obesity (HCC) - BMI 46.88 Assessment & Plan: We reviewed anthropometrics, biometrics, associated medical conditions and contributing factors with patient. Jennifer Davila would benefit from a medically tailored reduced calorie nutrional plan based on his REE (resting energy expenditure), which will be determined by indirect calorimetry.  We will also assess for cardiometabolic risk and nutritional derangements via fasting labs at intake  appointment.    Obesity Treatment / Action Plan:   She was weighed on the bioimpedance scale and results were discussed and documented in the synopsis.   Jennifer Davila will complete provided nutritional and psychosocial assessment questionnaire before the next appointment.  She will be scheduled for indirect calorimetry to determine resting  energy expenditure in a fasting state.  This will allow us  to create a reduced calorie, high-protein meal plan to promote loss of fat mass while preserving muscle mass.  We will also assess for cardiometabolic risk and nutritional derangements via an ECG and fasting serologies at her next appointment.  She was encouraged to work on amassing support from family and friends to begin their weight loss journey.   Work on eliminating or reducing the presence of highly processed, poorly nutritious, calorie-dense foods in the home.   Obesity Education Performed Today:  Patient was counseled on nutritional approaches to weight loss and benefits of reducing processed foods and consuming plant-based foods and high quality protein as part of nutritional weight management program.   We discussed the importance of long term lifestyle changes which include nutrition, exercise and behavioral modifications as well as the importance of customizing this to her specific health and social needs.   We discussed the benefits of reaching a healthier weight to alleviate the symptoms of existing conditions and reduce the risks of the biomechanical, metabolic and psychological effects of obesity.  Was counseled on the health benefits of losing 5%-10% of total body weight.  Was counseled on our cognitive behavorial therapy program, lead by our bariatric psychologist, who focuses on emotional eating and creating positive behavorial change.  Was counseled on bariatric pharmacotherapy and how this may be used as an adjunct in their weight management    Jennifer Davila appears to be in the action stage of change and states they are ready to start intensive lifestyle modifications and behavioral modifications.  It was recommended that she follow up in the next 1-2 weeks to review the above steps, and to continue with treatment of their chronic disease state of obesity   FOR OTHER CONDITIONS RELATED TO THE DISEASE OF  OBESITY: Prediabetes Assessment & Plan: Lab Results  Component Value Date   HGBA1C 6.3 07/22/2023   HGBA1C 6.1 (H) 05/30/2022   HGBA1C 6.2 11/01/2021    No meds currently. Diet controlled. Pt states she has gotten into the habit of eating out and eating the wrong things since her husband passed in June 2024. Not actively exercising currently, sometimes walks around the house. She reports only eating one meal a day.   Educated pt on pathophysiology of insulin  resistance and prediabetes and the possible progression to diabetes. Condition can improve with better eating habits and exercise. Pt informed that should she desire to join the program, her condition will be monitored alongside PCP and specialists.    Hypertension, essential, benign Assessment & Plan: BP Readings from Last 3 Encounters:  09/03/23 (!) 164/102  08/17/23 (!) 143/90  07/22/23 (!) 150/90   BP is uncontrolled today; initial BP was 150/84 and repeat was 164/102. Because her blood pressure has been poorly controlled for several months, she was recently switched from Amlodipine  to combo pill (Azor ) by PCP. Pt is compliant with Azor  5-40 mg once daily and Aldactone  25 mg once daily with good tolerance. Per pt, her systolic BP averages in the 140s but sometimes is in the 120s.   Educated pt how obesity can effect her hypertension. Encouraged pt to follow  up with her PCP as directed by them. If pt desires to join the program, we will continue to monitor her condition alongside her PCP/specialists.    Attestations:   Reviewed by clinician on day of visit: allergies, medications, problem list, medical history, surgical history, family history, social history, and previous encounter notes pertinent to obesity diagnosis. 43 minutes was spent today on this visit including the above counseling, pre-visit chart review, and post-visit documentation.  Over 50% of this time was spent in direct, face-to-face counseling and coordination  of care  I, Jennifer Davila, acting as a medical scribe for Jennifer Jenkins, DO., have compiled all relevant documentation for today's office visit on behalf of Jennifer Jenkins, DO, while in the presence of [employer], DO.  I have reviewed the above documentation for accuracy and completeness, and I agree with the above. Jennifer Davila, D.O.  The 21st Century Cures Act was signed into law in 2016 which includes the topic of electronic health records.  This provides immediate access to information in MyChart.  This includes consultation notes, operative notes, office notes, lab results and pathology reports.  If you have any questions about what you read please let us  know at your next visit so we can discuss your concerns and take corrective action if need be.  We are right here with you!

## 2023-09-04 ENCOUNTER — Encounter: Payer: Self-pay | Admitting: Physical Medicine and Rehabilitation

## 2023-09-04 ENCOUNTER — Telehealth: Payer: Self-pay

## 2023-09-04 ENCOUNTER — Encounter: Payer: 59 | Attending: Physical Medicine and Rehabilitation | Admitting: Physical Medicine and Rehabilitation

## 2023-09-04 VITALS — BP 134/83 | HR 81 | Ht 61.0 in | Wt 249.6 lb

## 2023-09-04 DIAGNOSIS — M79604 Pain in right leg: Secondary | ICD-10-CM | POA: Insufficient documentation

## 2023-09-04 DIAGNOSIS — G609 Hereditary and idiopathic neuropathy, unspecified: Secondary | ICD-10-CM | POA: Insufficient documentation

## 2023-09-04 DIAGNOSIS — M79605 Pain in left leg: Secondary | ICD-10-CM | POA: Insufficient documentation

## 2023-09-04 MED ORDER — CAPSAICIN-CLEANSING GEL 8 % EX KIT
4.0000 | PACK | Freq: Once | CUTANEOUS | Status: AC
  Administered 2023-09-04: 4 via TOPICAL

## 2023-09-04 NOTE — Patient Instructions (Signed)
-   Resume Usual Activities. Notify Physician of any unusual bleeding, erythema or concern for side effects as reviewed above. - Apply ice prn for pain - Tylenol  prn for pain - Follow up for Qutenza  every 3 months     - Patient is currently taking Prozac  only as needed; advised her that antidepressants are daily medications that take 6-8 weeks to feel full effects, and she should take this medication every day regardless of symptoms. She is understanding. PCP is refilling medication

## 2023-09-04 NOTE — Telephone Encounter (Signed)
 Pt brought in BP reading- 122/64 HR 82.

## 2023-09-04 NOTE — Progress Notes (Signed)
 HPI: Jennifer Davila is a 68 y.o. female with PMHx has Hip pain; Hypertension, essential, benign; Hyperlipidemia; Morbid obesity (HCC); Vertigo; Leg cramping; Vitamin D  deficiency, unspecified; GERD (gastroesophageal reflux disease); Leg pain; Bilateral lower extremity edema; Lower GI bleed; Hypokalemia; Constipation; Diverticulosis of colon with hemorrhage; Hematochezia; Iron  deficiency anemia; Chronic urticaria; Seasonal and perennial allergic rhinitis; Positive ANA (antinuclear antibody); Bilateral leg pain; Prediabetes; GI bleed; COVID-19 virus infection; Nephrolithiasis; Gait difficulty; Osteopenia; Hereditary and idiopathic peripheral neuropathy; Skin pruritus; Dieting; Adjustment disorder with mixed anxiety and depressed mood; and Adjustment insomnia on their problem list. who presents to clinic for treatment of pain related to peripheral neuropathy with qutenza  patches as described below.    No new concerns or complaints. No major changes in medical history since last visit.  her best friend has not seen her since / not providing support.  BP much improved compared to last visit! She is working with dietary on improving sodium intake/ diet.   She got a letter from insurance stating she is approved for Qutenza  4 patches through the end of 2025; no longer has a copay so she is agreeable to getting every 3 months now since it is no longer $200 OOP and has provided benefit.    Physical Exam:  General: Appropriate appearance for age.  Mental Status: Appropriate mood and affect.  Cardiovascular: RRR. + lymphedema bilaterally Respiratory: CTAB, no rales/rhonchi/wheezing.  Skin: No apparent rashes or lesions.  Neuro: Awake, alert, and oriented x3. Sensory loss in stocking pattern in bilateral lower extremities.  MSK:  Moving all 4 limbs antigravity and against resistance.   PROCEDURE:  Qutenza  Patch application Diagnosis:    ICD-10-CM   1. Hereditary and idiopathic peripheral neuropathy   G60.9     2. Bilateral leg pain  M79.604    M79.605       Goals with treatment: [ x ] Decrease pain [  ] Improve Active / Passive ROM [ x ] Improve ADLs [ x ] Improve functional mobility  MEDICATION:  [ x ] Qutenza  8% topical capsaicin  - 4 patches   CONSENT: Obtained in writing per policy. Consent uploaded to chart.  Benefits discussed.  Risks discussed included, but were not limited to, pain and discomfort, bleeding, bruising, allergic reaction, infection. All questions answered to patient/family member/guardian/ caregiver satisfaction. They would like to proceed with procedure. There are no noted contraindications to procedure.  PROCEDURE Time out was preformed No heat sources No antibiotics  The patient was explained about both the benefits and risks of a capsaicin  8% patch application. After the patient acknowledged an understanding of the risks and benefits, the patient agreed to proceed.  2 patches of Qutenza  were applied to the area of pain in each mid-shin and plantar and dorsal foot.  Blood pressure was monitored every 15 minutes. The patient tolerated the procedure well for 30 minutes until patches were removed and post-patch cream was applies.   No complications were encountered. The patient tolerated the procedure well.  Impression: HPI: Jennifer Davila is a 68 y.o. female with PMHx has Hip pain; Hypertension, essential, benign; Hyperlipidemia; Morbid obesity (HCC); Vertigo; Leg cramping; Vitamin D  deficiency, unspecified; GERD (gastroesophageal reflux disease); Leg pain; Bilateral lower extremity edema; Lower GI bleed; Hypokalemia; Constipation; Diverticulosis of colon with hemorrhage; Hematochezia; Iron  deficiency anemia; Chronic urticaria; Seasonal and perennial allergic rhinitis; Positive ANA (antinuclear antibody); Bilateral leg pain; Prediabetes; GI bleed; COVID-19 virus infection; Nephrolithiasis; Gait difficulty; Osteopenia; Hereditary and idiopathic peripheral  neuropathy; Skin pruritus; Dieting;  Adjustment disorder with mixed anxiety and depressed mood; and Adjustment insomnia on their problem list. who presents to clinic for treatment of pain related to peripheral neuropathy with qutenza  patches as described below.    PLAN: - Resume Usual Activities. Notify Physician of any unusual bleeding, erythema or concern for side effects as reviewed above. - Apply ice prn for pain - Tylenol  prn for pain - Follow up for Qutenza  every 3 months     - Patient is currently taking Prozac  only as needed; advised her that antidepressants are daily medications that take 6-8 weeks to feel full effects, and she should take this medication every day regardless of symptoms. She is understanding. PCP is refilling medication  Patient/Care Marko was ready to learn without apparent learning barriers. Education was provided on diagnosis, treatment options/plan according to patient's preferred learning style. Patient/Care Giver verbalized understanding and agreement with the above plan.   Joesph JAYSON Likes, DO 09/04/2023

## 2023-09-04 NOTE — Telephone Encounter (Signed)
Noted. BJ 

## 2023-10-02 ENCOUNTER — Ambulatory Visit: Payer: Self-pay | Admitting: Family Medicine

## 2023-10-02 NOTE — Telephone Encounter (Signed)
 Pt had general questions regarding normal fasting/post meal BG levels as she is monitoring at home. This RN educated pt and answered all questions. No further action needed.  Reason for Disposition  Health Information question, no triage required and triager able to answer question  Answer Assessment - Initial Assessment Questions 1. REASON FOR CALL or QUESTION: "What is your reason for calling today?" or "How can I best help you?" or "What question do you have that I can help answer?"     Patient requesting education regarding monitoring blood sugar levels  Protocols used: Information Only Call - No Triage-A-AH

## 2023-10-03 ENCOUNTER — Ambulatory Visit (INDEPENDENT_AMBULATORY_CARE_PROVIDER_SITE_OTHER): Payer: 59 | Admitting: Family Medicine

## 2023-10-03 ENCOUNTER — Encounter (INDEPENDENT_AMBULATORY_CARE_PROVIDER_SITE_OTHER): Payer: Self-pay | Admitting: Family Medicine

## 2023-10-03 VITALS — BP 156/94 | HR 74 | Temp 98.2°F | Ht 60.0 in | Wt 248.0 lb

## 2023-10-03 DIAGNOSIS — R0602 Shortness of breath: Secondary | ICD-10-CM

## 2023-10-03 DIAGNOSIS — R5383 Other fatigue: Secondary | ICD-10-CM | POA: Diagnosis not present

## 2023-10-03 DIAGNOSIS — Z9884 Bariatric surgery status: Secondary | ICD-10-CM | POA: Diagnosis not present

## 2023-10-03 DIAGNOSIS — E785 Hyperlipidemia, unspecified: Secondary | ICD-10-CM

## 2023-10-03 DIAGNOSIS — Z1331 Encounter for screening for depression: Secondary | ICD-10-CM | POA: Diagnosis not present

## 2023-10-03 DIAGNOSIS — Z6841 Body Mass Index (BMI) 40.0 and over, adult: Secondary | ICD-10-CM

## 2023-10-03 DIAGNOSIS — E538 Deficiency of other specified B group vitamins: Secondary | ICD-10-CM | POA: Diagnosis not present

## 2023-10-03 DIAGNOSIS — E559 Vitamin D deficiency, unspecified: Secondary | ICD-10-CM

## 2023-10-03 DIAGNOSIS — R7303 Prediabetes: Secondary | ICD-10-CM | POA: Diagnosis not present

## 2023-10-03 DIAGNOSIS — I10 Essential (primary) hypertension: Secondary | ICD-10-CM | POA: Diagnosis not present

## 2023-10-03 NOTE — Progress Notes (Signed)
 [name], D.O.  AB[facility], AB[facility] Specializing in Clinical Bariatric Medicine Office located at: [address] [place], [place]  [address]    Bariatric Medicine Visit  Dear [place], [name], MD   Thank you for referring Jennifer Davila to our clinic today for evaluation.  We performed a consultation to discuss her options for treatment and educate the patient on her disease state.  The following note includes my evaluation and treatment recommendations.   Please do not hesitate to reach out to me directly if you have any further concerns.   Assessment and Plan:   FOR THE DISEASE OF OBESITY:  Morbid obesity (HCC) - start BMI 48.43  Assessment & Plan: [name] is currently in the action stage of change. As such, her goal is to start our weight management plan.  She has agreed to: follow the Category 1 MP with L options    Behavioral Intervention We discussed the following today: how to properly use food scale, working on maintaining a reduced calorie state, getting the recommended amount of protein, incorporating whole foods, making healthy choices, staying well hydrated and practicing mindfulness when eating.  Additional resources provided today: handout on CAT 1 meal plan , Handout on CAT 1-2 lunch options, and handout on non-starchy vegetables  Evidence-based interventions for health behavior change were utilized today including the discussion of self monitoring techniques, problem-solving barriers and SMART goal setting techniques.    Pt will specifically work on: n/a   Recommended Physical Activity Goals Bethanee has been advised to work up to 150 minutes of moderate intensity aerobic activity a week and strengthening exercises 2-3 times per week for cardiovascular health, weight loss maintenance and preservation of muscle mass.   She has agreed to : maintain current level of activity.    Pharmacotherapy We both agreed to : begin with nutritional and  behavioral strategies   FOR ASSOCIATED CONDITIONS ADDRESSED TODAY:  Fatigue Assessment & Plan: Jatziri does feel that her weight is causing her energy to be lower than it should be. Fatigue may be related to obesity, depression or many other causes. she does not appear to have any red flag symptoms and this appears to most likely be related to her current lifestyle habits and dietary intake.  Labs will be ordered and reviewed with her at their next office visit in two weeks.  Epworth sleepiness scale is 6 and appears to be within normal limits. Jennifer Davila admits to very occasional daytime somnolence and  sometimes  wakes up still tired. Patient has a history of morning headache 2 times per week. Erica generally gets 6 hours of sleep per night, and states that she has generally restful sleep. Snoring is present. Apneic episodes are not present.   ECG: Performed and reviewed/ interpreted independently.  Low voltage, normal sinus rhythm, rate 72 bpm; reassuring without any acute abnormalities, will continue to monitor for symptoms   Relevant Orders:  -     Hemoglobin A1c -     Folate -     Lipid panel -     TSH -     T4, free -     Insulin, random -     Vitamin B12 -     VITAMIN D 25 Hydroxy (Vit-D Deficiency, Fractures)   Shortness of breath on exertion Assessment & Plan: Jennifer Davila does feel that she gets out of breath more easily than she used to when she exercises and seems to be worsening over time with weight  gain.  This has gotten worse recently. Jennifer Davila denies shortness of breath at rest or orthopnea. Jennifer Davila's shortness of breath appears to be obesity related and exercise induced, as they do not appear to have any "red flag" symptoms/ concerns today.  Also, this condition appears to be related to a state of poor cardiovascular conditioning   Obtain labs today and will be reviewed with her at their next office visit in two weeks.  Indirect Calorimeter completed  today to help guide our dietary regimen. It shows a VO2 of 199 and a REE of 1368.  Her calculated basal metabolic rate is 0454 thus her resting energy expenditure is worse than expected.  Patient agreed to work on weight loss at this time.  As [name] progresses through our weight loss program, we will gradually increase exercise as tolerated to treat her current condition.   If Jennifer Davila follows our recommendations and loses 5-10% of their weight without improvement of her shortness of breath or if at any time, symptoms become more concerning, they agree to urgently follow up with their PCP/ specialist for further consideration/ evaluation.   [name] verbalizes agreement with this plan.    Depression Screen  Assessment & Plan: Her Food and Mood (modified PHQ-9) score was positive at 11. Denies SI/HI.  Depression is commonly associated with obesity and often results in emotional eating behaviors. Pt tends to eat when stressed, sad, as a reward, and to help comfort herself. We will monitor this closely and work on CBT to help improve the non-hunger eating patterns. Pt actively seeking to establish with a therapist. Referall to our bariatric psychologist may be warranted in the future.   Prediabetes Assessment & Plan: Onset - Oct 2017, A1c of 5.7 at that time. Most recent A1c of 6.3 on 06/2023.  No medicines. Begin balanced diet focusing on protein, fruits, and vegetables while limiting simple carbohydrates. Losing 10% or more of body weight may improve condition.    Hypertension, essential, benign Assessment & Plan: Last 3 blood pressure readings in our office are as follows: BP Readings from Last 3 Encounters:  10/03/23 (!) 156/94  09/04/23 134/83  09/03/23 (!) 164/102   BP above target today. States she did not take her Azor and Aldactone this morning. Her BP at home: 130s-140s/60s on average. Instructed her to check BP every other day at home. Continue adherence to antihypertensive  therapy and begin implementation of low sodium diet.   Hyperlipidemia, unspecified hyperlipidemia type Assessment & Plan: Chronic issue. Currently on Pravachol 20 mg daily, compliance & tolerance good. Begin to work on Engineer, technical sales -decreasing simple carbohydrates, increasing lean proteins, decreasing saturated fats and cholesterol , avoiding trans fats and exercise as able to promote weight loss, improve lipids and decrease cardiovascular risks. Continue statin.    Status post gastric banding - in 1974 Assessment & Plan: H/o Lap Band done in 1974 at [facility]. Pt does not recall name of surgeon. Pre-OP weight: 250 lbs. Lowest weight was 179 lbs 2 months post op. She attributes this rapid weight loss to walking 10 miles/day. States the Lap Band was removed 2 years post op d/t difficulties with swallowing foods. Begin implementation of medically supervised weight management plan    B12 deficiency Assessment & Plan: H/o B12 deficiency. She was on a B-complex in the past but stopped it on her own several months ago. Will check labs and advise further at next OV.   Vitamin D deficiency, unspecified Assessment & Plan: H/o vit D  deficiency. Pt was on ERGO 50,000 units weekly for a short period  per her PCP. She stopped it in December. She is currently not on any supplementation. Will check labs and advise further at next OV.   FOLLOW UP:   Follow up in 2 weeks. She was informed of the importance of frequent follow up visits to maximize her success with intensive lifestyle modifications for her multiple health conditions.  [name] is aware that we will review all of her lab results at our next visit.  She is aware that if anything is critical/ life threatening with the results, we will be contacting her via MyChart prior to the office visit to discuss management.    Chief Complaint:   OBESITY Jennifer Davila (MR# [id number]) is a pleasant 68 y.o. female who  presents for evaluation and treatment of obesity and related comorbidities. Current BMI is Body mass index is 48.43 kg/m. MELENA HAYES has been struggling with her weight for many years and has been unsuccessful in either losing weight, maintaining weight loss, or reaching her healthy weight goal.  [name] is currently in the action stage of change and ready to dedicate time achieving and maintaining a healthier weight. NEETA STOREY is interested in becoming our patient and working on intensive lifestyle modifications including (but not limited to) diet and exercise for weight loss.  VONYA OHALLORAN is retired. Patient is widowed and has 1 adult child. She lives alone.  Heaviest weight: 258 lbs in 2020.   Recently gained 6-7 lbs when husband passed away in 01-30-23.   Desires to be down 50 lbs in 3 months.   Does not exercise.   Has never tried a formal diet plan in the past.   Eats outside the home (fast food) once a week.  Only eats one meal a day.  Craves sweets. Snacks on chips, cheese, and sweets.   Drinks caloric beverages: tea with honey & coffee with creamers.  Worst food habit: sweets  Subjective:   This is the patient's first visit at [facility].  The patient's NEW PATIENT PACKET that they filled out prior to today's office visit was reviewed at length and information from that paperwork was included within the following office visit note.    Included in the packet: current and past health history, medications, allergies, ROS, gynecologic history (women only), surgical history, family history, social history, weight history, weight loss surgery history (for those that have had weight loss surgery), nutritional evaluation, mood and food questionnaire along with a depression screening (PHQ9) on all patients, an Epworth questionnaire, sleep habits questionnaire, patient life and health improvement goals questionnaire. These will all be  scanned into the patient's chart under the "media" tab.   Review of Systems: Please refer to new patient packet scanned into media. Pertinent positives were addressed with patient today.  Reviewed by clinician on day of visit: allergies, medications, problem list, medical history, surgical history, family history, social history, and previous encounter notes.  During the visit, I independently reviewed the patient's EKG, bioimpedance scale results, and indirect calorimeter results. I used this information to tailor a meal plan for the patient that will help [name] to lose weight and will improve her obesity-related conditions going forward.  I performed a medically necessary appropriate examination and/or evaluation. I discussed the assessment and treatment plan with the patient. The patient was provided an opportunity to ask questions and all were answered. The  patient agreed with the plan and demonstrated an understanding of the instructions. Labs were ordered today (unless patient declined them) and will be reviewed with the patient at our next visit unless more critical results need to be addressed immediately. Clinical information was updated and documented in the EMR.    Objective:   PHYSICAL EXAM: Blood pressure (!) 156/94, pulse 74, temperature 98.2 F (36.8 C), height 5' (1.524 m), weight 248 lb (112.5 kg), SpO2 96%. Body mass index is 48.43 kg/m.  General: Well Developed, well nourished, and in no acute distress.  HEENT: Normocephalic, atraumatic; EOMI, sclerae are anicteric. Skin: Warm and dry, good turgor Chest:  Normal excursion, shape, no gross ABN Respiratory: No conversational dyspnea; speaking in full sentences NeuroM-Sk:  Normal gross ROM * 4 extremities  Psych: A and O *3, insight adequate, mood- full   Anthropometric Measurements Height: 5' (1.524 m) Weight: 248 lb (112.5 kg) BMI (Calculated): 48.43 Weight at Last Visit: N/A Weight Lost Since Last Visit:  N/A Weight Gained Since Last Visit: N/A Starting Weight: 248 lb Peak Weight: 250 lb Waist Measurement : 47 inches   Body Composition  Body Fat %: 53.8 % Fat Mass (lbs): 133.6 lbs Muscle Mass (lbs): 108.8 lbs Total Body Water (lbs): 87.6 lbs Visceral Fat Rating : 21   Other Clinical Data RMR: 1368 Fasting: Yes Labs: Yes Today's Visit #: 1 Starting Date: 10/03/23 Comments: First Visit    DIAGNOSTIC DATA REVIEWED:  BMET    Component Value Date/Time   NA 138 08/29/2023 0943   NA 142 05/30/2022 1236   K 3.9 08/29/2023 0943   CL 102 08/29/2023 0943   CO2 29 08/29/2023 0943   GLUCOSE 112 (H) 08/29/2023 0943   BUN 20 08/29/2023 0943   BUN 13 05/30/2022 1236   CREATININE 1.03 08/29/2023 0943   CREATININE 1.03 (H) 05/03/2016 1152   CALCIUM 9.2 08/29/2023 0943   GFRNONAA >60 08/17/2023 1243   GFRNONAA 58 (L) 05/03/2016 1152   GFRAA 68 04/19/2020 1051   GFRAA 67 05/03/2016 1152   Lab Results  Component Value Date   HGBA1C 6.3 07/22/2023   HGBA1C 5.7 (H) 05/03/2016   No results found for: "INSULIN" Lab Results  Component Value Date   TSH 3.240 05/30/2022   CBC    Component Value Date/Time   WBC 4.7 08/17/2023 1243   RBC 5.55 (H) 08/17/2023 1243   HGB 12.7 08/17/2023 1243   HGB 12.1 05/30/2022 1235   HCT 41.4 08/17/2023 1243   HCT 39.2 05/30/2022 1235   PLT 268 08/17/2023 1243   PLT 312 05/30/2022 1235   MCV 74.6 (L) 08/17/2023 1243   MCV 75 (L) 05/30/2022 1235   MCH 22.9 (L) 08/17/2023 1243   MCHC 30.7 08/17/2023 1243   RDW 14.3 08/17/2023 1243   RDW 14.8 05/30/2022 1235   Iron Studies    Component Value Date/Time   IRON 36 05/30/2022 1236   TIBC 369 05/30/2022 1236   FERRITIN 85 05/30/2022 1236   IRONPCTSAT 10 (L) 05/30/2022 1236   Lipid Panel     Component Value Date/Time   CHOL 173 11/01/2021 1040   TRIG 155.0 (H) 11/01/2021 1040   HDL 40.60 11/01/2021 1040   CHOLHDL 4 11/01/2021 1040   VLDL 31.0 11/01/2021 1040   LDLCALC 101 (H)  11/01/2021 1040   Hepatic Function Panel     Component Value Date/Time   PROT 7.6 08/17/2023 1243   PROT 7.0 05/30/2022 1236   ALBUMIN 4.0 08/17/2023 1243  ALBUMIN 4.3 05/30/2022 1236   AST 18 08/17/2023 1243   ALT 10 08/17/2023 1243   ALKPHOS 102 08/17/2023 1243   BILITOT 0.4 08/17/2023 1243   BILITOT 0.2 05/30/2022 1236   BILIDIR <0.1 (L) 04/27/2015 1420   IBILI NOT CALCULATED 04/27/2015 1420      Component Value Date/Time   TSH 3.240 05/30/2022 1236   Nutritional Lab Results  Component Value Date   VD25OH 14.69 (L) 07/22/2023   VD25OH 11.51 (L) 11/01/2021   VD25OH 38 02/23/2020    Attestation Statements:   I, Special Puri, acting as a Stage manager for Thomasene Lot, DO., have compiled all relevant documentation for today's office visit on behalf of Thomasene Lot, DO, while in the presence of [employer], DO.  Reviewed by clinician on day of visit: allergies, medications, problem list, medical history, surgical history, family history, social history, and previous encounter notes pertinent to patient's obesity diagnosis. I have spent 60 minutes in the care of the patient today including: preparing to see patient (e.g. review and interpretation of tests, old notes ), obtaining and/or reviewing separately obtained history, performing a medically appropriate examination or evaluation, counseling and educating the patient, ordering medications, test or procedures, documenting clinical information in the electronic or other health care record, and independently interpreting results and communicating results to the patient, family, or caregiver   I have reviewed the above documentation for accuracy and completeness, and I agree with the above. [name], D.O.  The 21st Century Cures Act was signed into law in 2016 which includes the topic of electronic health records.  This provides immediate access to information in MyChart.  This includes consultation notes,  operative notes, office notes, lab results and pathology reports.  If you have any questions about what you read please let us know at your next visit so we can discuss your concerns and take corrective action if need be.  We are right here with you.

## 2023-10-04 LAB — LIPID PANEL
Chol/HDL Ratio: 3.8 ratio (ref 0.0–4.4)
Cholesterol, Total: 173 mg/dL (ref 100–199)
HDL: 46 mg/dL (ref >39)
LDL Chol Calc (NIH): 109 mg/dL — ABNORMAL HIGH (ref 0–99)
Triglycerides: 98 mg/dL (ref 0–149)
VLDL Cholesterol Cal: 18 mg/dL (ref 5–40)

## 2023-10-04 LAB — VITAMIN D 25 HYDROXY (VIT D DEFICIENCY, FRACTURES): Vit D, 25-Hydroxy: 33.5 ng/mL (ref 30.0–100.0)

## 2023-10-04 LAB — TSH: TSH: 4.24 u[IU]/mL (ref 0.450–4.500)

## 2023-10-04 LAB — HEMOGLOBIN A1C
Est. average glucose Bld gHb Est-mCnc: 128 mg/dL
Hgb A1c MFr Bld: 6.1 % — ABNORMAL HIGH (ref 4.8–5.6)

## 2023-10-04 LAB — T4, FREE: Free T4: 1.18 ng/dL (ref 0.82–1.77)

## 2023-10-04 LAB — VITAMIN B12: Vitamin B-12: 557 pg/mL (ref 232–1245)

## 2023-10-04 LAB — INSULIN, RANDOM: INSULIN: 18.2 u[IU]/mL (ref 2.6–24.9)

## 2023-10-04 LAB — FOLATE: Folate: 18.8 ng/mL (ref >3.0)

## 2023-10-08 ENCOUNTER — Other Ambulatory Visit: Payer: Self-pay | Admitting: Family Medicine

## 2023-10-08 DIAGNOSIS — I10 Essential (primary) hypertension: Secondary | ICD-10-CM

## 2023-10-15 ENCOUNTER — Other Ambulatory Visit: Payer: Self-pay | Admitting: Gastroenterology

## 2023-10-15 ENCOUNTER — Other Ambulatory Visit: Payer: Self-pay | Admitting: Physical Medicine and Rehabilitation

## 2023-10-15 DIAGNOSIS — K219 Gastro-esophageal reflux disease without esophagitis: Secondary | ICD-10-CM

## 2023-10-17 ENCOUNTER — Encounter (INDEPENDENT_AMBULATORY_CARE_PROVIDER_SITE_OTHER): Payer: Self-pay | Admitting: Family Medicine

## 2023-10-17 ENCOUNTER — Ambulatory Visit (INDEPENDENT_AMBULATORY_CARE_PROVIDER_SITE_OTHER): Payer: 59 | Admitting: Family Medicine

## 2023-10-17 VITALS — BP 164/92 | HR 71 | Temp 97.9°F | Ht 60.0 in | Wt 242.0 lb

## 2023-10-17 DIAGNOSIS — E559 Vitamin D deficiency, unspecified: Secondary | ICD-10-CM

## 2023-10-17 DIAGNOSIS — I10 Essential (primary) hypertension: Secondary | ICD-10-CM

## 2023-10-17 DIAGNOSIS — E538 Deficiency of other specified B group vitamins: Secondary | ICD-10-CM | POA: Diagnosis not present

## 2023-10-17 DIAGNOSIS — R7303 Prediabetes: Secondary | ICD-10-CM | POA: Diagnosis not present

## 2023-10-17 DIAGNOSIS — E785 Hyperlipidemia, unspecified: Secondary | ICD-10-CM

## 2023-10-17 DIAGNOSIS — Z6841 Body Mass Index (BMI) 40.0 and over, adult: Secondary | ICD-10-CM

## 2023-10-17 MED ORDER — VITAMIN D (ERGOCALCIFEROL) 1.25 MG (50000 UNIT) PO CAPS: 50000.0000 [IU] | ORAL_CAPSULE | ORAL | 1 refills | Status: DC

## 2023-10-17 NOTE — Progress Notes (Signed)
 Jennifer Davila, D.O.  ABFM, ABOM Clinical Bariatric Medicine Physician  Office located at: [address] [place], [place]  [address]     Assessment and Plan:   No orders of the defined types were placed in this encounter.   Medications Discontinued During This Encounter  Medication Reason   b complex vitamins capsule Patient Preference   calcium carbonate (OSCAL) 1500 (600 Ca) MG TABS tablet Patient Preference   Ferrous Sulfate (IRON) 325 (65 Fe) MG TABS Patient Preference   ondansetron (ZOFRAN-ODT) 4 MG disintegrating tablet Patient Preference   oxyCODONE-acetaminophen (PERCOCET/ROXICET) 5-325 MG tablet Completed Course   tamsulosin (FLOMAX) 0.4 MG CAPS capsule Patient Preference     Meds ordered this encounter  Medications   Vitamin D, Ergocalciferol, (DRISDOL) 1.25 MG (50000 UNIT) CAPS capsule    Sig: Take 1 capsule (50,000 Units total) by mouth every 7 (seven) days.    Dispense:  4 capsule    Refill:  1      FOR THE DISEASE OF OBESITY: Morbid obesity (HCC) - start BMI 48.43 Since last office visit on 10/03/23 patient's muscle mass has decreased by 3 lb. Fat mass has decreased by 2.8 lb. Total body water has decreased by 1 lb.  Counseling done on how various foods will affect these numbers and how to maximize success  Total lbs lost to date: 6 lbs  Total weight loss percentage to date: -2.42%     Recommended Dietary Goals [name] is currently in the action stage of change. As such, her goal is to continue weight management plan.   She has agreed to:  Continue Category 1 Meal Plan with Lunch options and add Breakfast options   Behavioral Intervention We discussed the following today: increasing lean protein intake to established goals, decreasing simple carbohydrates , work on tracking and journaling calories using tracking application, keeping healthy foods at home, decreasing eating out or consumption of processed foods, and making healthy choices when  eating convenient foods, avoiding temptations and identifying enticing environmental cues, continue to work on implementation of reduced calorie nutritional plan, and better snacking choices  Additional resources provided today: Handout on hyperinsulinemia and health risks, Handout on CAT 1-2 breakfast options, and Handout on CAT 1-2 lunch options   Evidence-based interventions for health behavior change were utilized today including the discussion of self monitoring techniques, problem-solving barriers and SMART goal setting techniques.  Regarding patient's less desirable eating habits and patterns, we employed the technique of small changes.   Pt will specifically work on: Accurately measure and weigh her foods on a food scale    Recommended Physical Activity Goals [name] has been advised to work up to 150 minutes of moderate intensity aerobic activity a week and strengthening exercises 2-3 times per week for cardiovascular health, weight loss maintenance and preservation of muscle mass.   She has agreed to :  Think about enjoyable ways to increase daily physical activity and overcoming barriers to exercise and Increase physical activity in their day and reduce sedentary time (increase NEAT).   Pharmacotherapy We discussed various medication options to help [name] with her weight loss efforts and we both agreed to: continue with nutritional and behavioral strategies   FOR ASSOCIATED CONDITIONS ADDRESSED TODAY: Hyperlipidemia, unspecified hyperlipidemia type Assessment & Plan: Lab Results  Component Value Date   CHOL 173 10/03/2023   HDL 46 10/03/2023   LDLCALC 109 (H) 10/03/2023   TRIG 98 10/03/2023   CHOLHDL 3.8 10/03/2023   Pt is on  Pravastatin 20 mg once daily. Good compliance and tolerance, no adverse SE reported. Most recent labs show LDL was elevated at 109 on 10/03/23.   Reviewed ideal LDL of less than 70 and TG of less than 150. Recommended pt to continue efforts  towards healthy lifestyle changes with healthy eating and exercise to improve HDL and decrease LDL. Avoid fatty meats, sat/trans fast, and butters. Encouraged pt to visit the American Heart Association website for more information on her condition. Will continue to monitor condition and recheck lipid panel 6 months from last obtained labs.    Prediabetes Assessment & Plan: Lab Results  Component Value Date   HGBA1C 6.1 (H) 10/03/2023   HGBA1C 6.3 07/22/2023   HGBA1C 6.1 (H) 05/30/2022   INSULIN 18.2 10/03/2023    No meds currently. Diet/exercise approach. A1C improved from 6.3 on 07/22/23 to most recent A1C of 6.1 on 10/03/23. Last insulin was also above goal at 18.2. She reports her cravings were uncontrolled last week, resulting in her eating a small piece of a brownie. She has not been properly measuring/weighing her daily calorie/protein intake, stating her food scale has broken.   Counseled patient on pathophysiology of the disease process prediabetes and reviewed how it may progress to diabetes if poorly controlled. A handout was provided at pt's request after education provided.  All concerns/questions addressed.  Work on implementation of her meal plan by continuing efforts to decrease simple carbs/ sugars and increasing protein intake to help decrease the risk of progressing to diabetes. Encouraged [name] to track caloric and protein intake even when eating off plan or making baked goods. We will recheck A1c and fasting insulin level in approximately 3 months from last check, or as deemed appropriate.    Hypertension, essential, benign Assessment & Plan: BP Readings from Last 3 Encounters:  10/17/23 (!) 164/92  10/03/23 (!) 156/94  09/04/23 134/83   BP is above goal today, which she attributes to not taking her antihypertensives. Her BP was also elevated at her last OV and she had not taken her antihypertensives then either.  Pt is prescribed Azor once daily, spironolactone 25 mg once  daily, but notes she has "fallen behind" with her regimen and missed a couple doses of these meds. At home BP log includes readings of 106/55 pulse 80, 121/68 pulse 70, 122/64 pulse 82. She reports good medication compliance at the time of these readings.   Stressed the importance of consistently taking her antihypertensives on a daily basis. Patient instructed to continue monitoring her BP and pulse at home, especially if she feels poorly in any way. Reviewed how blood pressures and overall condition is expected to improve with weight loss. Encouraged pt to continue following a heart healthy diet per her prudent nutritional meal plan and gradually start exercising as able.    B12 deficiency Assessment & Plan: Lab Results  Component Value Date   VITAMINB12 557 10/03/2023   B12 is at goal. She is not currently taking any supplementation. Denies any hx of B12 deficiency and has not taken B12 supplements in the past.   Reviewed ideal goal B12 of 500 or more with patient. Continue with current regimen. No changes made today. Will continue to monitor condition as it pertains to her weight loss journey.    Vitamin D deficiency, unspecified Assessment & Plan: Lab Results  Component Value Date   VD25OH 33.5 10/03/2023   VD25OH 14.69 (L) 07/22/2023   VD25OH 11.51 (L) 11/01/2021   Most recent Vit D  is below goal at 33.5 as of 10/03/23. Previously was taking vitamin D supplementation at the direction of her PCP. Not currently on any supplementation.   Ideal vitamin D levels of 50-70 was reviewed with patient. These levels are expected to improve with weight loss. Discussed how low vitamin D levels can effect energy levels, moods, and may make weight loss more difficult. Mutually agreed to start ERGO 50K units once weekly. Will plan to recheck vitamin D in 3-4 months to monitor her levels.   Orders: - Start ERGO 50K units once weekly.   FOLLOW UP:   Return in about 19 days (around 11/05/2023) for  Keep upcoming appt, make another appt. She was informed of the importance of frequent follow up visits to maximize her success with intensive lifestyle modifications for her multiple health conditions.  Subjective:   Chief complaint: Obesity Jennifer Davila is here to discuss her progress with her obesity treatment plan. She is on the Category 1 Plan with L options  and states she is following her eating plan approximately 100% of the time. She states she is not exercising.   Interval History:  LOREENA VALERI is here today for her first follow-up office visit since starting the program with [place]. Patient is off to a good start and has lost 6 lbs. She has been trying to measure her food but has had difficulty with her food scale. She has on average been eating 1-3 ounces of [place] or chicken slices deli meat on her sandwiches. She also includes onions, tomatoes, and lettuce in her sandwiches. Pt reports she has been a bit bored with what she has been eating at breakfast. Does report eating a small piece of a brownie to satisfy her sweet cravings.   All blood work/ lab tests that were recently ordered by myself or an outside provider were reviewed with patient today per their request. Extended time was spent counseling her on all new disease processes that were discovered or preexisting ones that are affected by BMI.  she understands that many of these abnormalities will need to monitored regularly along with the current treatment plan of prudent dietary changes, in which we are making each and every office visit, to improve these health parameters.  Pharmacotherapy for weight loss: She is not currently taking medications  for medical weight loss.  Denies side effects.    Review of Systems:  Pertinent positives were addressed with patient today.  Reviewed by clinician on day of visit: allergies, medications, problem list, medical history, surgical history, family history, social history, and previous  encounter notes.   Weight Summary and Biometrics   Weight Lost Since Last Visit: 6 lb  Weight Gained Since Last Visit: 0   Vitals Temp: 97.9 F (36.6 C) BP: (!) 164/92 Pulse Rate: 71 SpO2: 98 %   Anthropometric Measurements Height: 5' (1.524 m) Weight: 242 lb (109.8 kg) BMI (Calculated): 47.26 Weight at Last Visit: 248 lb Weight Lost Since Last Visit: 6 lb Weight Gained Since Last Visit: 0 Starting Weight: 248 lb Total Weight Loss (lbs): 6 lb (2.722 kg) Peak Weight: 250 lb   Body Composition  Body Fat %: 54 % Fat Mass (lbs): 130.8 lbs Muscle Mass (lbs): 105.8 lbs Total Body Water (lbs): 86.6 lbs Visceral Fat Rating : 21   Other Clinical Data Fasting: No Labs: No Today's Visit #: 2 Starting Date: 10/03/23     Objective:   PHYSICAL EXAM:  Blood pressure (!) 164/92, pulse 71, temperature 97.9 F (36.6  C), height 5' (1.524 m), weight 242 lb (109.8 kg), SpO2 98%. Body mass index is 47.26 kg/m.  General: she is overweight, cooperative and in no acute distress.   HEENT: EOMI, sclerae are anicteric. Lungs: Normal breathing effort, no conversational dyspnea. M-Sk:  Normal gross ROM * 4 extremities  PSYCH: Has normal mood, affect and thought process. Neurologic: No gross sensory or motor deficits. Well developed, A and O * 3  DIAGNOSTIC DATA REVIEWED:  BMET    Component Value Date/Time   NA 138 08/29/2023 0943   NA 142 05/30/2022 1236   K 3.9 08/29/2023 0943   CL 102 08/29/2023 0943   CO2 29 08/29/2023 0943   GLUCOSE 112 (H) 08/29/2023 0943   BUN 20 08/29/2023 0943   BUN 13 05/30/2022 1236   CREATININE 1.03 08/29/2023 0943   CREATININE 1.03 (H) 05/03/2016 1152   CALCIUM 9.2 08/29/2023 0943   GFRNONAA >60 08/17/2023 1243   GFRNONAA 58 (L) 05/03/2016 1152   GFRAA 68 04/19/2020 1051   GFRAA 67 05/03/2016 1152   Lab Results  Component Value Date   HGBA1C 6.1 (H) 10/03/2023   HGBA1C 5.7 (H) 05/03/2016   Lab Results  Component Value Date    INSULIN 18.2 10/03/2023   Lab Results  Component Value Date   TSH 4.240 10/03/2023   CBC    Component Value Date/Time   WBC 4.7 08/17/2023 1243   RBC 5.55 (H) 08/17/2023 1243   HGB 12.7 08/17/2023 1243   HGB 12.1 05/30/2022 1235   HCT 41.4 08/17/2023 1243   HCT 39.2 05/30/2022 1235   PLT 268 08/17/2023 1243   PLT 312 05/30/2022 1235   MCV 74.6 (L) 08/17/2023 1243   MCV 75 (L) 05/30/2022 1235   MCH 22.9 (L) 08/17/2023 1243   MCHC 30.7 08/17/2023 1243   RDW 14.3 08/17/2023 1243   RDW 14.8 05/30/2022 1235   Iron Studies    Component Value Date/Time   IRON 36 05/30/2022 1236   TIBC 369 05/30/2022 1236   FERRITIN 85 05/30/2022 1236   IRONPCTSAT 10 (L) 05/30/2022 1236   Lipid Panel     Component Value Date/Time   CHOL 173 10/03/2023 1004   TRIG 98 10/03/2023 1004   HDL 46 10/03/2023 1004   CHOLHDL 3.8 10/03/2023 1004   CHOLHDL 4 11/01/2021 1040   VLDL 31.0 11/01/2021 1040   LDLCALC 109 (H) 10/03/2023 1004   Hepatic Function Panel     Component Value Date/Time   PROT 7.6 08/17/2023 1243   PROT 7.0 05/30/2022 1236   ALBUMIN 4.0 08/17/2023 1243   ALBUMIN 4.3 05/30/2022 1236   AST 18 08/17/2023 1243   ALT 10 08/17/2023 1243   ALKPHOS 102 08/17/2023 1243   BILITOT 0.4 08/17/2023 1243   BILITOT 0.2 05/30/2022 1236   BILIDIR <0.1 (L) 04/27/2015 1420   IBILI NOT CALCULATED 04/27/2015 1420      Component Value Date/Time   TSH 4.240 10/03/2023 1004   Nutritional Lab Results  Component Value Date   VD25OH 33.5 10/03/2023   VD25OH 14.69 (L) 07/22/2023   VD25OH 11.51 (L) 11/01/2021    Attestations:   Burnett Sheng, acting as a medical scribe for Thomasene Lot, DO., have compiled all relevant documentation for today's office visit on behalf of Thomasene Lot, DO, while in the presence of [employer], DO.  Reviewed by clinician on day of visit: allergies, medications, problem list, medical history, surgical history, family history, social history, and  previous encounter notes pertinent to  patient's obesity diagnosis.   I have spent 57 minutes in the care of the patient today including: preparing to see patient (e.g. review and interpretation of tests, old notes ), obtaining and/or reviewing separately obtained history, performing a medically appropriate examination or evaluation, counseling and educating the patient, ordering medications, test or procedures, documenting clinical information in the electronic or other health care record, and independently interpreting results and communicating results to the patient, family, or caregiver   I have reviewed the above documentation for accuracy and completeness, and I agree with the above. Jennifer Davila, D.O.  The 21st Century Cures Act was signed into law in 2016 which includes the topic of electronic health records.  This provides immediate access to information in MyChart.  This includes consultation notes, operative notes, office notes, lab results and pathology reports.  If you have any questions about what you read please let us know at your next visit so we can discuss your concerns and take corrective action if need be.  We are right here with you.

## 2023-11-05 ENCOUNTER — Encounter (INDEPENDENT_AMBULATORY_CARE_PROVIDER_SITE_OTHER): Payer: Self-pay | Admitting: Family Medicine

## 2023-11-05 ENCOUNTER — Ambulatory Visit (INDEPENDENT_AMBULATORY_CARE_PROVIDER_SITE_OTHER): Admitting: Family Medicine

## 2023-11-05 VITALS — BP 136/82 | HR 85 | Temp 98.7°F | Ht 60.0 in | Wt 238.0 lb

## 2023-11-05 DIAGNOSIS — R7303 Prediabetes: Secondary | ICD-10-CM

## 2023-11-05 DIAGNOSIS — E559 Vitamin D deficiency, unspecified: Secondary | ICD-10-CM

## 2023-11-05 DIAGNOSIS — Z6841 Body Mass Index (BMI) 40.0 and over, adult: Secondary | ICD-10-CM

## 2023-11-05 DIAGNOSIS — I10 Essential (primary) hypertension: Secondary | ICD-10-CM

## 2023-11-05 NOTE — Progress Notes (Signed)
 Jennifer Davila, D.O.  ABFM, ABOM Specializing in Clinical Bariatric Medicine  Office located at: [address] [place], [place]  [address]   Assessment and Plan:  No orders of the defined types were placed in this encounter.   There are no discontinued medications.   No orders of the defined types were placed in this encounter.    FOR THE DISEASE OF OBESITY:  BMI 45.0-49.9, adult (HCC) - current BMI 46.48 Morbid obesity (HCC) - start BMI 48.43 Assessment & Plan: Since last office visit on 10/17/2023 patient's  Muscle mass has decreased by 0.8 lb. Fat mass has decreased by 3.4 lb. Total body water has decreased by 0.8 lb.  Counseling done on how various foods will affect these numbers and how to maximize success  Total lbs lost to date: 10 lbs  Total weight loss percentage to date: 4.03%    Recommended Dietary Goals Jennifer Davila is currently in the action stage of change. As such, her goal is to continue weight management plan.  She has agreed to: continue current plan   Behavioral Intervention We discussed the following today: continue to work on maintaining a reduced calorie state, getting the recommended amount of protein, incorporating whole foods, making healthy choices, staying well hydrated and practicing mindfulness when eating.  Additional resources provided today: {[id number]::"None"}  Evidence-based interventions for health behavior change were utilized today including the discussion of self monitoring techniques, problem-solving barriers and SMART goal setting techniques.   Regarding patient's less desirable eating habits and patterns, we employed the technique of small changes.   Pt will specifically work on: n/a   Recommended Physical Activity Goals Jennifer Davila has been advised to work up to 150 minutes of moderate intensity aerobic activity a week and strengthening exercises 2-3 times per week for cardiovascular health, weight loss maintenance  and preservation of muscle mass.   She has agreed to : exercise (walking and resistance training) at the [facility] 60 minutes, 2 days a week   Pharmacotherapy We both agreed to : continue with nutritional and behavioral strategies   FOR ASSOCIATED CONDITIONS ADDRESSED TODAY:  Prediabetes Assessment & Plan: Lab Results  Component Value Date   HGBA1C 6.1 (H) 10/03/2023   HGBA1C 6.3 07/22/2023   HGBA1C 6.1 (H) 05/30/2022   INSULIN 18.2 10/03/2023    Most recent Hemoglobin A1c and fasting insulin as above. No current meds - diet/exercise approach. Her hunger/cravings are controlled. Continue with reduced calorie meal plan low on processed crabs and simple sugars. Ongoing weight loss will improve insulin resistance and glycemic control   Hypertension, essential, benign Assessment & Plan: Last 3 blood pressure readings in our office are as follows: BP Readings from Last 3 Encounters:  11/05/23 136/82  10/17/23 (!) 164/92  10/03/23 (!) 156/94   Blood pressure is controlled today on Azor and Aldactone. Pt concerned about her BP at home b/c it has been "all over the place" (e.g 98/50, 105/73, 145/83, etc..). She thinks this maybe from wearing her BP cuff incorrectly or because her monitor is old.    Encouraged pt to explore the American Heart Association website to read more about how to properly check BP.   Recommend pt obtain Omron Series 3 blood pressure monitor.   Check daily at home - showed her how to put it on    Concerned about BP.   Blood pressure has been all over the place - could be related to old monitor 98/50   Pt asx.  Normal blood  pressure 120/80 or less.  Will monitor for orthostatic hypotension sysmtps   Vitamin D deficiency, unspecified Assessment & Plan: Lab Results  Component Value Date   VD25OH 33.5 10/03/2023   VD25OH 14.69 (L) 07/22/2023   VD25OH 11.51 (L) 11/01/2021   Most recent Vitamin D as above. Pt is currently on Ergocalciferol 50,000 units  weekly. No N/V or muscle weakness with Ergocalciferol. Continue regimen - recheck periodically.    Follow up:   Return 11/21/2023. She was informed of the importance of frequent follow up visits to maximize her success with intensive lifestyle modifications for her multiple health conditions.  Subjective:   Chief complaint: Obesity Jennifer Davila is here to discuss her progress with her obesity treatment plan. She is on the Category 1 Plan with B and L options and states she is following her eating plan approximately 100% of the time. She states she is not exercising.  Interval History:  Jennifer Davila is here for a follow up office visit. Since last OV on 10/17/2023, pt is down 4 lbs. Endorses being protein focused and is accurately  measuring all her foods on her weighing scale. She has no complaints with her meal plan. Her hunger/cravings are controlled.   Pharmacotherapy for weight loss: n/a  Review of Systems:  Pertinent positives were addressed with patient today.  Reviewed by clinician on day of visit: allergies, medications, problem list, medical history, surgical history, family history, social history, and previous encounter notes.  Weight Summary and Biometrics   Weight Lost Since Last Visit: 4  Weight Gained Since Last Visit: 0   Vitals Temp: 98.7 F (37.1 C) BP: 136/82 Pulse Rate: 85 SpO2: 100 %   Anthropometric Measurements Height: 5' (1.524 m) Weight: 238 lb (108 kg) BMI (Calculated): 46.48 Weight at Last Visit: 248.0lb Weight Lost Since Last Visit: 4 Weight Gained Since Last Visit: 0 Starting Weight: 248lb Total Weight Loss (lbs): 10 lb (4.536 kg) Peak Weight: 250lb   Body Composition  Body Fat %: 53.5 % Fat Mass (lbs): 127.4 lbs Muscle Mass (lbs): 105 lbs Total Body Water (lbs): 85.8 lbs Visceral Fat Rating : 20   Other Clinical Data Fasting: No Labs: No Today's Visit #: 3 Starting Date: 10/03/23   Objective:   PHYSICAL EXAM: Blood  pressure 136/82, pulse 85, temperature 98.7 F (37.1 C), height 5' (1.524 m), weight 238 lb (108 kg), SpO2 100%. Body mass index is 46.48 kg/m.  General: she is overweight, cooperative and in no acute distress. PSYCH: Has normal mood, affect and thought process.   HEENT: EOMI, sclerae are anicteric. Lungs: Normal breathing effort, no conversational dyspnea. Extremities: Moves * 4 Neurologic: A and O * 3, good insight  DIAGNOSTIC DATA REVIEWED: BMET    Component Value Date/Time   NA 138 08/29/2023 0943   NA 142 05/30/2022 1236   K 3.9 08/29/2023 0943   CL 102 08/29/2023 0943   CO2 29 08/29/2023 0943   GLUCOSE 112 (H) 08/29/2023 0943   BUN 20 08/29/2023 0943   BUN 13 05/30/2022 1236   CREATININE 1.03 08/29/2023 0943   CREATININE 1.03 (H) 05/03/2016 1152   CALCIUM 9.2 08/29/2023 0943   GFRNONAA >60 08/17/2023 1243   GFRNONAA 58 (L) 05/03/2016 1152   GFRAA 68 04/19/2020 1051   GFRAA 67 05/03/2016 1152   Lab Results  Component Value Date   HGBA1C 6.1 (H) 10/03/2023   HGBA1C 5.7 (H) 05/03/2016   Lab Results  Component Value Date   INSULIN 18.2 10/03/2023  Lab Results  Component Value Date   TSH 4.240 10/03/2023   CBC    Component Value Date/Time   WBC 4.7 08/17/2023 1243   RBC 5.55 (H) 08/17/2023 1243   HGB 12.7 08/17/2023 1243   HGB 12.1 05/30/2022 1235   HCT 41.4 08/17/2023 1243   HCT 39.2 05/30/2022 1235   PLT 268 08/17/2023 1243   PLT 312 05/30/2022 1235   MCV 74.6 (L) 08/17/2023 1243   MCV 75 (L) 05/30/2022 1235   MCH 22.9 (L) 08/17/2023 1243   MCHC 30.7 08/17/2023 1243   RDW 14.3 08/17/2023 1243   RDW 14.8 05/30/2022 1235   Iron Studies    Component Value Date/Time   IRON 36 05/30/2022 1236   TIBC 369 05/30/2022 1236   FERRITIN 85 05/30/2022 1236   IRONPCTSAT 10 (L) 05/30/2022 1236   Lipid Panel     Component Value Date/Time   CHOL 173 10/03/2023 1004   TRIG 98 10/03/2023 1004   HDL 46 10/03/2023 1004   CHOLHDL 3.8 10/03/2023 1004    CHOLHDL 4 11/01/2021 1040   VLDL 31.0 11/01/2021 1040   LDLCALC 109 (H) 10/03/2023 1004   Hepatic Function Panel     Component Value Date/Time   PROT 7.6 08/17/2023 1243   PROT 7.0 05/30/2022 1236   ALBUMIN 4.0 08/17/2023 1243   ALBUMIN 4.3 05/30/2022 1236   AST 18 08/17/2023 1243   ALT 10 08/17/2023 1243   ALKPHOS 102 08/17/2023 1243   BILITOT 0.4 08/17/2023 1243   BILITOT 0.2 05/30/2022 1236   BILIDIR <0.1 (L) 04/27/2015 1420   IBILI NOT CALCULATED 04/27/2015 1420      Component Value Date/Time   TSH 4.240 10/03/2023 1004   Nutritional Lab Results  Component Value Date   VD25OH 33.5 10/03/2023   VD25OH 14.69 (L) 07/22/2023   VD25OH 11.51 (L) 11/01/2021    Attestations:   I, Special Puri, acting as a Stage manager for Thomasene Lot, DO., have compiled all relevant documentation for today's office visit on behalf of Thomasene Lot, DO, while in the presence of [employer], DO.  Reviewed by clinician on day of visit: allergies, medications, problem list, medical history, surgical history, family history, social history, and previous encounter notes pertinent to patient's obesity diagnosis.  I have spent X minutes in the care of the patient today including: preparing to see patient (e.g. review and interpretation of tests, old notes ), obtaining and/or reviewing separately obtained history, performing a medically appropriate examination or evaluation, counseling and educating the patient, ordering medications, test or procedures, documenting clinical information in the electronic or other health care record, and independently interpreting results and communicating results to the patient, family, or caregiver   I have reviewed the above documentation for accuracy and completeness, and I agree with the above. Jennifer Davila, D.O.  The 21st Century Cures Act was signed into law in 2016 which includes the topic of electronic health records.  This provides immediate  access to information in MyChart.  This includes consultation notes, operative notes, office notes, lab results and pathology reports.  If you have any questions about what you read please let us know at your next visit so we can discuss your concerns and take corrective action if need be.  We are right here with you.

## 2023-11-06 ENCOUNTER — Other Ambulatory Visit: Payer: Self-pay | Admitting: Family Medicine

## 2023-11-06 DIAGNOSIS — E785 Hyperlipidemia, unspecified: Secondary | ICD-10-CM

## 2023-11-07 ENCOUNTER — Other Ambulatory Visit (INDEPENDENT_AMBULATORY_CARE_PROVIDER_SITE_OTHER): Payer: Self-pay | Admitting: Family Medicine

## 2023-11-12 ENCOUNTER — Other Ambulatory Visit (HOSPITAL_COMMUNITY): Payer: Self-pay

## 2023-11-13 ENCOUNTER — Other Ambulatory Visit: Payer: Self-pay | Admitting: Family Medicine

## 2023-11-13 DIAGNOSIS — F5102 Adjustment insomnia: Secondary | ICD-10-CM

## 2023-11-14 DIAGNOSIS — H2513 Age-related nuclear cataract, bilateral: Secondary | ICD-10-CM | POA: Diagnosis not present

## 2023-11-14 DIAGNOSIS — H40013 Open angle with borderline findings, low risk, bilateral: Secondary | ICD-10-CM | POA: Diagnosis not present

## 2023-11-20 ENCOUNTER — Other Ambulatory Visit: Payer: Self-pay | Admitting: Physical Medicine and Rehabilitation

## 2023-11-20 DIAGNOSIS — H40013 Open angle with borderline findings, low risk, bilateral: Secondary | ICD-10-CM | POA: Diagnosis not present

## 2023-11-20 DIAGNOSIS — G609 Hereditary and idiopathic neuropathy, unspecified: Secondary | ICD-10-CM

## 2023-11-21 ENCOUNTER — Encounter (INDEPENDENT_AMBULATORY_CARE_PROVIDER_SITE_OTHER): Payer: Self-pay | Admitting: Family Medicine

## 2023-11-21 ENCOUNTER — Ambulatory Visit (INDEPENDENT_AMBULATORY_CARE_PROVIDER_SITE_OTHER): Admitting: Family Medicine

## 2023-11-21 VITALS — BP 128/78 | HR 82 | Ht 60.0 in | Wt 237.0 lb

## 2023-11-21 DIAGNOSIS — E559 Vitamin D deficiency, unspecified: Secondary | ICD-10-CM

## 2023-11-21 DIAGNOSIS — R7303 Prediabetes: Secondary | ICD-10-CM | POA: Diagnosis not present

## 2023-11-21 DIAGNOSIS — I10 Essential (primary) hypertension: Secondary | ICD-10-CM | POA: Diagnosis not present

## 2023-11-21 DIAGNOSIS — Z6841 Body Mass Index (BMI) 40.0 and over, adult: Secondary | ICD-10-CM

## 2023-11-21 MED ORDER — VITAMIN D (ERGOCALCIFEROL) 1.25 MG (50000 UNIT) PO CAPS: 50000.0000 [IU] | ORAL_CAPSULE | ORAL | 0 refills | Status: DC

## 2023-11-21 NOTE — Progress Notes (Signed)
 Jennifer Davila, D.O.  ABFM, ABOM Specializing in Clinical Bariatric Medicine  Office located at: [address] [place], [place]  [address]   Assessment and Plan:   Medications Discontinued During This Encounter  Medication Reason   Vitamin D , Ergocalciferol , (DRISDOL ) 1.25 MG (50000 UNIT) CAPS capsule Reorder     Meds ordered this encounter  Medications   Vitamin D , Ergocalciferol , (DRISDOL ) 1.25 MG (50000 UNIT) CAPS capsule    Sig: Take 1 capsule (50,000 Units total) by mouth every 7 (seven) days.    Dispense:  4 capsule    Refill:  0     FOR THE DISEASE OF OBESITY:  BMI 45.0-49.9, adult (HCC) - current BMI 46.29 Morbid obesity (HCC) - start BMI 48.43 Assessment & Plan: Since last office visit on 11/05/2023 patient's  Muscle mass has decreased by 0.6 lb. Fat mass has decreased by 0.4 lb. Total body water not registered Counseling done on how various foods will affect these numbers and how to maximize success  Total lbs lost to date: 11 lbs  Total weight loss percentage to date: 4.44%    Recommended Dietary Goals Jennifer Davila is currently in the action stage of change. As such, her goal is to continue weight management plan.  She has agreed to: continue current plan   Behavioral Intervention We discussed the following today: increasing lean protein intake to established goals and decreasing simple carbohydrates   Additional resources provided today: Handout on CAT 1 meal plan   Evidence-based interventions for health behavior change were utilized today including the discussion of self monitoring techniques, problem-solving barriers and SMART goal setting techniques.   Regarding patient's less desirable eating habits and patterns, we employed the technique of small changes.   Pt will specifically work on: n/a   Recommended Physical Activity Goals Jennifer Davila has been advised to work up to 300-450 minutes of moderate intensity aerobic activity a week and  strengthening exercises 2-3 times per week for cardiovascular health, weight loss maintenance and preservation of muscle mass.   She has agreed to : start resistance training at the [facility] 60 minutes, 2 days a week  Pharmacotherapy We both agreed to : continue with nutritional and behavioral strategies   FOR ASSOCIATED CONDITIONS ADDRESSED TODAY:  Prediabetes Assessment & Plan: Most recent A1c and fasting insulin : Lab Results  Component Value Date   HGBA1C 6.1 (H) 10/03/2023   HGBA1C 6.3 07/22/2023   HGBA1C 6.1 (H) 05/30/2022   INSULIN  18.2 10/03/2023    Diet and lifestyle approach. States that her hunger and cravings are controlled. No acute concerns. Continue balanced diet focusing on protein, fruits, and vegetables while limiting simple carbohydrates. Losing 10% or more of body weight may improve condition. Recheck labs mid June.    Hypertension, essential, benign Assessment & Plan: Last 3 blood pressure readings in our office are as follows: BP Readings from Last 3 Encounters:  11/21/23 128/78  11/05/23 136/82  10/17/23 (!) 164/92   The 10-year ASCVD risk score (Arnett DK, et al., 2019) is: 10.4%  Lab Results  Component Value Date   CREATININE 1.03 08/29/2023   Blood pressure is controlled today on Azor  and Aldactone . She has no acute concerns today.Continue regimen and low sodium meal plan. Increase exercise as able.    Vitamin D  deficiency, unspecified Assessment & Plan: Most recent Vitamin D :  Lab Results  Component Value Date   VD25OH 33.5 10/03/2023   VD25OH 14.69 (L) 07/22/2023   VD25OH 11.51 (L) 11/01/2021  Currently on ergocalciferol  50,000 units weekly without any adverse effects such as nausea, vomiting or muscle weakness. Continue vitamin D  supplementation - recheck levels in mid June.    Follow up:   Return 12/03/2023. She was informed of the importance of frequent follow up visits to maximize her success with intensive lifestyle modifications for  her multiple health conditions.  Subjective:   Chief complaint: Obesity Jennifer Davila is here to discuss her progress with her obesity treatment plan. She is on the Category 1 Plan with B and L options and states she is following her eating plan approximately 100% of the time. She states she is not exercising d/t being sick.   Interval History:  Jennifer Davila is here for a follow up office visit. Since last OV on 11/05/2023, pt is down 1 lb. States she has been sick since Sunday. Is meeting with her PCP on 4/28 to address this. Has been off her meal plan since Sunday, but prior to this reports following it very closely. She is ready to get back on track once she feels better.   Pharmacotherapy for weight loss: none  Review of Systems:  Pertinent positives were addressed with patient today.Reviewed by clinician on day of visit: allergies, medications, problem list, medical history, surgical history, family history, social history, and previous encounter notes.  Weight Summary and Biometrics   Weight Lost Since Last Visit: 1lb  Weight Gained Since Last Visit: 0lb   Vitals BP: 128/78 Pulse Rate: 82 SpO2: 99 %   Anthropometric Measurements Height: 5' (1.524 m) Weight: 237 lb (107.5 kg) BMI (Calculated): 46.29 Weight at Last Visit: 238lb Weight Lost Since Last Visit: 1lb Weight Gained Since Last Visit: 0lb Starting Weight: 248lb Total Weight Loss (lbs): 11 lb (4.99 kg) Peak Weight: 250lb Waist Measurement : 47 inches   Body Composition  Body Fat %: 53.6 % Fat Mass (lbs): 127 lbs Muscle Mass (lbs): 104.4 lbs Visceral Fat Rating : 20   Other Clinical Data Fasting: No Labs: No Today's Visit #: #4 Starting Date: 10/03/23   Objective:   PHYSICAL EXAM: Blood pressure 128/78, pulse 82, height 5' (1.524 m), weight 237 lb (107.5 kg), SpO2 99%. Body mass index is 46.29 kg/m.  General: she is overweight, cooperative and in no acute distress. PSYCH: Has normal mood,  affect and thought process.   HEENT: EOMI, sclerae are anicteric. Lungs: Normal breathing effort, no conversational dyspnea. Extremities: Moves * 4 Neurologic: A and O * 3, good insight  DIAGNOSTIC DATA REVIEWED: BMET    Component Value Date/Time   NA 138 08/29/2023 0943   NA 142 05/30/2022 1236   K 3.9 08/29/2023 0943   CL 102 08/29/2023 0943   CO2 29 08/29/2023 0943   GLUCOSE 112 (H) 08/29/2023 0943   BUN 20 08/29/2023 0943   BUN 13 05/30/2022 1236   CREATININE 1.03 08/29/2023 0943   CREATININE 1.03 (H) 05/03/2016 1152   CALCIUM  9.2 08/29/2023 0943   GFRNONAA >60 08/17/2023 1243   GFRNONAA 58 (L) 05/03/2016 1152   GFRAA 68 04/19/2020 1051   GFRAA 67 05/03/2016 1152   Lab Results  Component Value Date   HGBA1C 6.1 (H) 10/03/2023   HGBA1C 5.7 (H) 05/03/2016   Lab Results  Component Value Date   INSULIN  18.2 10/03/2023   Lab Results  Component Value Date   TSH 4.240 10/03/2023   CBC    Component Value Date/Time   WBC 4.7 08/17/2023 1243   RBC 5.55 (H) 08/17/2023 1243  HGB 12.7 08/17/2023 1243   HGB 12.1 05/30/2022 1235   HCT 41.4 08/17/2023 1243   HCT 39.2 05/30/2022 1235   PLT 268 08/17/2023 1243   PLT 312 05/30/2022 1235   MCV 74.6 (L) 08/17/2023 1243   MCV 75 (L) 05/30/2022 1235   MCH 22.9 (L) 08/17/2023 1243   MCHC 30.7 08/17/2023 1243   RDW 14.3 08/17/2023 1243   RDW 14.8 05/30/2022 1235   Iron  Studies    Component Value Date/Time   IRON  36 05/30/2022 1236   TIBC 369 05/30/2022 1236   FERRITIN 85 05/30/2022 1236   IRONPCTSAT 10 (L) 05/30/2022 1236   Lipid Panel     Component Value Date/Time   CHOL 173 10/03/2023 1004   TRIG 98 10/03/2023 1004   HDL 46 10/03/2023 1004   CHOLHDL 3.8 10/03/2023 1004   CHOLHDL 4 11/01/2021 1040   VLDL 31.0 11/01/2021 1040   LDLCALC 109 (H) 10/03/2023 1004   Hepatic Function Panel     Component Value Date/Time   PROT 7.6 08/17/2023 1243   PROT 7.0 05/30/2022 1236   ALBUMIN 4.0 08/17/2023 1243   ALBUMIN  4.3 05/30/2022 1236   AST 18 08/17/2023 1243   ALT 10 08/17/2023 1243   ALKPHOS 102 08/17/2023 1243   BILITOT 0.4 08/17/2023 1243   BILITOT 0.2 05/30/2022 1236   BILIDIR <0.1 (L) 04/27/2015 1420   IBILI NOT CALCULATED 04/27/2015 1420      Component Value Date/Time   TSH 4.240 10/03/2023 1004   Nutritional Lab Results  Component Value Date   VD25OH 33.5 10/03/2023   VD25OH 14.69 (L) 07/22/2023   VD25OH 11.51 (L) 11/01/2021    Attestations:   I, Special Puri, acting as a Stage manager for Marceil Sensor, DO., have compiled all relevant documentation for today's office visit on behalf of Marceil Sensor, DO, while in the presence of [employer], DO.  I have reviewed the above documentation for accuracy and completeness, and I agree with the above. Jennifer Davila, D.O.  The 21st Century Cures Act was signed into law in 2016 which includes the topic of electronic health records.  This provides immediate access to information in MyChart.  This includes consultation notes, operative notes, office notes, lab results and pathology reports.  If you have any questions about what you read please let us  know at your next visit so we can discuss your concerns and take corrective action if need be.  We are right here with you.

## 2023-11-22 ENCOUNTER — Telehealth: Payer: Self-pay | Admitting: Physical Medicine and Rehabilitation

## 2023-11-22 NOTE — Telephone Encounter (Signed)
 Jennifer Davila from [facility] pharmacy called in requesting quetenza patches refill and to set up shipment  Ph: [phone] fax [phone]

## 2023-11-25 ENCOUNTER — Ambulatory Visit: Admitting: Family Medicine

## 2023-11-25 ENCOUNTER — Encounter: Payer: Self-pay | Admitting: Family Medicine

## 2023-11-25 VITALS — BP 128/80 | HR 90 | Temp 98.6°F | Resp 16 | Ht 60.0 in | Wt 240.5 lb

## 2023-11-25 DIAGNOSIS — I10 Essential (primary) hypertension: Secondary | ICD-10-CM

## 2023-11-25 DIAGNOSIS — L989 Disorder of the skin and subcutaneous tissue, unspecified: Secondary | ICD-10-CM | POA: Diagnosis not present

## 2023-11-25 DIAGNOSIS — F5102 Adjustment insomnia: Secondary | ICD-10-CM

## 2023-11-25 DIAGNOSIS — G609 Hereditary and idiopathic neuropathy, unspecified: Secondary | ICD-10-CM

## 2023-11-25 MED ORDER — MUPIROCIN 2 % EX OINT: 1.0000 | TOPICAL_OINTMENT | Freq: Two times a day (BID) | CUTANEOUS | 0 refills | Status: AC

## 2023-11-25 NOTE — Patient Instructions (Addendum)
 A few things to remember from today's visit:  Adjustment insomnia  Skin lesion  Hereditary and idiopathic peripheral neuropathy  Apply antibiotic ointment 2 times daily for 7-10 days. Keep area clean with soap and water. Continue Trazodone  daily at bedtime as needed.  If you need refills for medications you take chronically, please call your pharmacy. Do not use My Chart to request refills or for acute issues that need immediate attention. If you send a my chart message, it may take a few days to be addressed, specially if I am not in the office.  Please be sure medication list is accurate. If a new problem present, please set up appointment sooner than planned today.

## 2023-11-25 NOTE — Assessment & Plan Note (Signed)
 BP adequately controlled. Continue Amlodipine -Olmesartan  5-40 mg daily and spironolactone  25 mg daily as well as low salt diet. Continue low-salt/DASH diet. Will cancel appt in 12/2023 and re-schedule for later this year.

## 2023-11-25 NOTE — Progress Notes (Signed)
 ACUTE VISIT Chief Complaint  Patient presents with   Skin Tag    Jennifer Davila off on Saturday    HPI: [name] is a 68 y.o. female with a PMHx significant for HTN, peripheral neuropathy, GERD, chronic urticaria, osteopenia, HLD, iron  deficiency anemia, prediabetes, and anxiety/depression, who is here today complaining of skin tag pain.    Skin Tag:  Patient reports she had a grape-sized skin tag that fell off after she tied a piece of dental floss around it.  She reports significant pus and odor from the skin tag. Local irritation has improved, not longer draining, no erythema or tenderness. Negative for fever or chills.  She has been following with the weight loss clinic and is quite pleased with their program and suggested dietary changes.  She has been taking vitamin D  50000 units weekly for vit D deficiency.  Lab Results  Component Value Date   VD25OH 33.5 10/03/2023   States that she continues having LE's pain. She feels like problem is getting worse. She also mentions she has been having some balance issues for the past few months, no falls.  Peripheral neuropathy: She takes Ibuprofen  400 mg, which she takes for LE pain when she goes to church on Sundays.  Hx of GI bleed. Last hospitalization for GI bleed in 06/2021. She has tried different medications, including Hydrocodone -Acetaminophen ,Tramadol ,and gabapentin .   [facility]. Denies blood in stool or melena.  Hypertension:  Medications: Currently on spironolactone  25 mg daily and amlodipine -olmesartan  5-40 mg daily.  Negative for unusual or severe headache, visual changes, exertional chest pain, dyspnea,  focal weakness, or worsening edema.  Lab Results  Component Value Date   CREATININE 1.03 08/29/2023   BUN 20 08/29/2023   NA 138 08/29/2023   K 3.9 08/29/2023   CL 102 08/29/2023   CO2 29 08/29/2023   Insomnia, which she feels like it is improving. She takes  Trazodone  50 mg 1/2-1 tab daily as needed, she does not take medication frequent.  Review of Systems  Constitutional:  Negative for activity change and appetite change.  HENT:  Negative for mouth sores and sore throat.   Respiratory:  Negative for cough and wheezing.   Gastrointestinal:  Negative for abdominal pain, nausea and vomiting.  Endocrine: Negative for cold intolerance and heat intolerance.  Genitourinary:  Negative for decreased urine volume, dysuria and hematuria.  Skin:  Negative for rash.  Neurological:  Negative for syncope and facial asymmetry.  See other pertinent positives and negatives in HPI.  Current Outpatient Medications on File Prior to Visit  Medication Sig Dispense Refill   acetaminophen  (TYLENOL ) 500 MG tablet Take 1-2 tablets (500-1,000 mg total) by mouth daily as needed for moderate pain (pain). 60 tablet 1   amLODipine -olmesartan  (AZOR ) 5-40 MG tablet Take 1 tablet by mouth daily. 90 tablet 2   capsaicin  topical system (QUTENZA , 4 PATCH,) 8 % APPLY 4 PATCHES TOPICALLY. PLACE ON AFFECTED AREA OF SKIN, EVERY 3 MONTHS AS NEEDED FOR NEUROPATHY. 4 patch 0   ibuprofen  (ADVIL ) 400 MG tablet Take 1 tablet (400 mg total) by mouth once a week. 30 tablet 0   methocarbamol  (ROBAXIN ) 750 MG tablet TAKE 1 TABLET BY MOUTH EVERY 6 (SIX) HOURS AS NEEDED FOR MUSCLE SPASMS (POSTERIOR THIGH PAIN). 90 tablet 2   omeprazole  (PRILOSEC) 20 MG capsule TAKE 1 CAPSULE BY MOUTH EVERY DAY 90 capsule 1   polyethylene glycol (MIRALAX  / GLYCOLAX ) 17 g packet Take  17 g by mouth every other day.     pravastatin  (PRAVACHOL ) 20 MG tablet TAKE 1 TABLET BY MOUTH EVERY DAY 90 tablet 3   spironolactone  (ALDACTONE ) 25 MG tablet Take 1 tablet (25 mg total) by mouth daily. 90 tablet 1   traZODone  (DESYREL ) 50 MG tablet TAKE 1/2 TO 1 TABLET (25 TO 50 MG TOTAL) BY MOUTH AT BEDTIME 30 tablet 2   Vitamin D , Ergocalciferol , (DRISDOL ) 1.25 MG (50000 UNIT) CAPS capsule Take 1 capsule (50,000 Units total) by  mouth every 7 (seven) days. 4 capsule 0   No current facility-administered medications on file prior to visit.    Past Medical History:  Diagnosis Date   Arthritis    Constipation    on stool softener   GERD (gastroesophageal reflux disease)    GERD (gastroesophageal reflux disease)    High blood pressure    High cholesterol    Hyperlipidemia    Hypertension    Joint pain    Kidney stones    Pre-diabetes    Rheumatoid arthritis (HCC)    Trouble swallowing 2013   following lap band   Urticaria    Vitamin D  deficiency    No Known Allergies  Social History   Socioeconomic History   Marital status: Married    Spouse name: Not on file   Number of children: 1   Years of education: Not on file   Highest education level: Not on file  Occupational History   Occupation: caregiver  Tobacco Use   Smoking status: Former    Current packs/day: 0.00    Types: Cigarettes    Quit date: 10/02/1972    Years since quitting: 51.1   Smokeless tobacco: Never  Vaping Use   Vaping status: Never Used  Substance and Sexual Activity   Alcohol use: No   Drug use: No   Sexual activity: Never  Other Topics Concern   Not on file  Social History Narrative   Not on file   Social Drivers of Health   Financial Resource Strain: Low Risk  (02/22/2023)   Overall Financial Resource Strain (CARDIA)    Difficulty of Paying Living Expenses: Not hard at all  Food Insecurity: No Food Insecurity (02/22/2023)   Hunger Vital Sign    Worried About Running Out of Food in the Last Year: Never true    Ran Out of Food in the Last Year: Never true  Transportation Needs: No Transportation Needs (02/22/2023)   PRAPARE - Administrator, Civil Service (Medical): No    Lack of Transportation (Non-Medical): No  Physical Activity: Inactive (02/22/2023)   Exercise Vital Sign    Days of Exercise per Week: 0 days    Minutes of Exercise per Session: 0 min  Stress: No Stress Concern Present (02/22/2023)    [employer] of Occupational Health - Occupational Stress Questionnaire    Feeling of Stress : Not at all  Social Connections: Moderately Integrated (02/22/2023)   Social Connection and Isolation Panel [NHANES]    Frequency of Communication with Friends and Family: More than three times a week    Frequency of Social Gatherings with Friends and Family: More than three times a week    Attends Religious Services: More than 4 times per year    Active Member of [employer] or Organizations: Yes    Attends Banker Meetings: More than 4 times per year    Marital Status: Widowed   Vitals:   11/25/23 1256  BP:  128/80  Pulse: 90  Resp: 16  Temp: 98.6 F (37 C)  SpO2: 97%   Body mass index is 46.97 kg/m.  Physical Exam Vitals and nursing note reviewed.  Constitutional:      General: She is not in acute distress.    Appearance: She is well-developed.  HENT:     Head: Normocephalic and atraumatic.  Eyes:     Conjunctiva/sclera: Conjunctivae normal.  Cardiovascular:     Comments: DP pulses palpable. Trace pitting LE edema bilateral. Pulmonary:     Effort: Pulmonary effort is normal. No respiratory distress.     Breath sounds: Normal breath sounds.  Abdominal:     Palpations: Abdomen is soft. There is no mass.     Tenderness: There is no abdominal tenderness.  Lymphadenopathy:     Cervical: No cervical adenopathy.  Skin:    General: Skin is warm.       Neurological:     Mental Status: She is alert and oriented to person, place, and time.  Psychiatric:        Mood and Affect: Mood and affect normal.    ASSESSMENT AND PLAN:  Ms. Kaczmarek was seen today for skin tag pain.   Adjustment insomnia Assessment & Plan: Problem has improved. Continue trazodone  50 mg 1/2 to 1 tablet daily as needed. Continue good sleep hygiene.  Skin lesion She describes it as a skin tag, grape size, which fell off , tied floss around. Area has healed, no residual lesion. Recommend  topical mupirocin  bid for 10 days. Monitor for recurrence.  -     Mupirocin ; Apply 1 Application topically 2 (two) times daily for 10 days.  Dispense: 20 g; Refill: 0  Hereditary and idiopathic peripheral neuropathy Assessment & Plan: Still having significant LE pain, she has tried different medications and were not effective. Work up otherwise negative. Ibuprofen  400 mg helps, she just takes med Sundays when she goes to church. She understands side effects, including GI bleed. Continue adequate skin care and fall precautions.  Hypertension, essential, benign Assessment & Plan: BP adequately controlled. Continue Amlodipine -Olmesartan  5-40 mg daily and spironolactone  25 mg daily as well as low salt diet. Continue low-salt/DASH diet. Will cancel appt in 12/2023 and re-schedule for later this year.  Return in 6 months (on 05/26/2024) for chronic problems.  I, Fritz Jewel Wierda, acting as a scribe for Yisel Megill [place], MD., have documented all relevant documentation on the behalf of Drake Wuertz [place], MD, as directed by  Felishia Wartman [place], MD while in the presence of Labria Wos [place], MD.   I, Titania Gault [place], MD, have reviewed all documentation for this visit. The documentation on 11/25/23 for the exam, diagnosis, procedures, and orders are all accurate and complete.  Elsey Holts G. [place], MD  [facility]. [place] office.

## 2023-11-25 NOTE — Assessment & Plan Note (Signed)
 Problem has improved. Continue trazodone  50 mg 1/2 to 1 tablet daily as needed. Continue good sleep hygiene.

## 2023-11-27 NOTE — Telephone Encounter (Signed)
 I sent a script for this today; What do I need to do further to set up shipment?

## 2023-11-28 NOTE — Assessment & Plan Note (Signed)
 Still having significant LE pain, she has tried different medications and were not effective. Work up otherwise negative. Ibuprofen  400 mg helps, she just takes med Sundays when she goes to church. She understands side effects, including GI bleed. Continue adequate skin care and fall precautions.

## 2023-12-02 DIAGNOSIS — H25813 Combined forms of age-related cataract, bilateral: Secondary | ICD-10-CM | POA: Diagnosis not present

## 2023-12-02 DIAGNOSIS — H40013 Open angle with borderline findings, low risk, bilateral: Secondary | ICD-10-CM | POA: Diagnosis not present

## 2023-12-03 ENCOUNTER — Ambulatory Visit (INDEPENDENT_AMBULATORY_CARE_PROVIDER_SITE_OTHER): Admitting: Family Medicine

## 2023-12-03 ENCOUNTER — Encounter (INDEPENDENT_AMBULATORY_CARE_PROVIDER_SITE_OTHER): Payer: Self-pay | Admitting: Family Medicine

## 2023-12-03 VITALS — BP 135/85 | HR 76 | Temp 98.7°F | Ht 60.0 in | Wt 232.0 lb

## 2023-12-03 DIAGNOSIS — R7303 Prediabetes: Secondary | ICD-10-CM | POA: Diagnosis not present

## 2023-12-03 DIAGNOSIS — I10 Essential (primary) hypertension: Secondary | ICD-10-CM | POA: Diagnosis not present

## 2023-12-03 DIAGNOSIS — Z6841 Body Mass Index (BMI) 40.0 and over, adult: Secondary | ICD-10-CM | POA: Diagnosis not present

## 2023-12-03 MED ORDER — VITAMIN D (ERGOCALCIFEROL) 1.25 MG (50000 UNIT) PO CAPS: 50000.0000 [IU] | ORAL_CAPSULE | ORAL | 0 refills | Status: DC

## 2023-12-03 NOTE — Progress Notes (Signed)
 Jennifer Davila, D.O.  ABFM, ABOM Specializing in Clinical Bariatric Medicine  Office located at: [address] [place], [place]  [address]   Assessment and Plan:   Medications Discontinued During This Encounter  Medication Reason   Vitamin D , Ergocalciferol , (DRISDOL ) 1.25 MG (50000 UNIT) CAPS capsule Reorder     Meds ordered this encounter  Medications   Vitamin D , Ergocalciferol , (DRISDOL ) 1.25 MG (50000 UNIT) CAPS capsule    Sig: Take 1 capsule (50,000 Units total) by mouth every 7 (seven) days.    Dispense:  4 capsule    Refill:  0    FOR THE DISEASE OF OBESITY:  BMI 45.0-49.9, adult (HCC) - current BMI 45.31 Morbid obesity (HCC) - start BMI 48.43 Assessment & Plan: Since last office visit on 11/21/2023 patient's  Muscle mass has increased by 4.8 lb. Fat mass has decreased by 9.2 lb. Total body water is 83.6 lbs. Counseling done on how various foods will affect these numbers and how to maximize success  Total lbs lost to date: 16 lbs  Total weight loss percentage to date: 6.45%    Recommended Dietary Goals Jennifer Davila is currently in the action stage of change. As such, her goal is to continue weight management plan.  She has agreed to: continue current plan   Behavioral Intervention We discussed the following today: continue to work on implementation of reduced calorie nutritional plan  Additional resources provided today: None  Evidence-based interventions for health behavior change were utilized today including the discussion of self monitoring techniques, problem-solving barriers and SMART goal setting techniques.   Regarding patient's less desirable eating habits and patterns, we employed the technique of small changes.   Pt will specifically work on: n/a   Recommended Physical Activity Goals Jennifer Davila has been advised to work up to 300-450 minutes of moderate intensity aerobic activity a week and strengthening exercises 2-3 times per week for  cardiovascular health, weight loss maintenance and preservation of muscle mass.   She has agreed to start resistance training at the [facility] 60 minutes, 2 days a week    Pharmacotherapy We both agreed to : continue with nutritional and behavioral strategies   ASSOCIATED CONDITIONS ADDRESSED TODAY:  Prediabetes Assessment & Plan: Most recent A1c and fasting insulin : Lab Results  Component Value Date   HGBA1C 6.1 (H) 10/03/2023   HGBA1C 6.3 07/22/2023   HGBA1C 6.1 (H) 05/30/2022   INSULIN  18.2 10/03/2023    Diet and lifestyle approach. No c/o hunger and cravings. She is doing excellent with her meal plan. She is getting in all her proteins and veggies. Denies skipping meals.Continue to decrease simple carbs/ sugars; increase fiber and proteins -> follow her meal plan.     Hypertension, essential, benign Assessment & Plan: Last 3 blood pressure readings in our office are as follows: BP Readings from Last 3 Encounters:  12/03/23 135/85  11/25/23 128/80  11/21/23 128/78   The 10-year ASCVD risk score (Arnett DK, et al., 2019) is: 11.4%  Lab Results  Component Value Date   CREATININE 1.03 08/29/2023   Blood pressure is controlled today on Azor  and Aldactone . She has no acute concerns today. Continue medications and low sodium eating plan.    Follow up:   Return 12/24/2023 at 11:00 AM w/ [place], Evangelina Hilt, PA-C. She was informed of the importance of frequent follow up visits to maximize her success with intensive lifestyle modifications for her multiple health conditions.  Subjective:   Chief complaint: Obesity Jennifer Davila is  here to discuss her progress with her obesity treatment plan. She is on the Category 1 Plan with B and L options and states she is following her eating plan approximately 98% of the time. She states she is not exercising.  Interval History:  Jennifer Davila is here for a follow up office visit. Since last OV on 11/21/2023, Pt is down 5 lbs. Pt ate  off plan during one celebratory occasion. Apart from this, she is doing excellent with her meal plan. She is getting in all her proteins and veggies. Denies skipping meals. Uses her snack calories on 100 calorie cookies.   Pharmacotherapy for weight loss: none  Review of Systems:  Pertinent positives were addressed with patient today.Reviewed by clinician on day of visit: allergies, medications, problem list, medical history, surgical history, family history, social history, and previous encounter notes.  Weight Summary and Biometrics   Weight Lost Since Last Visit: 5lb  Weight Gained Since Last Visit: 0lb   Vitals Temp: 98.7 F (37.1 C) BP: 135/85 Pulse Rate: 76 SpO2: 100 %   Anthropometric Measurements Height: 5' (1.524 m) Weight: 232 lb (105.2 kg) BMI (Calculated): 45.31 Weight at Last Visit: 240lb Weight Lost Since Last Visit: 5lb Weight Gained Since Last Visit: 0lb Starting Weight: 248lb Total Weight Loss (lbs): 16 lb (7.258 kg) Peak Weight: 250lb   Body Composition  Body Fat %: 50.6 % Fat Mass (lbs): 117.8 lbs Muscle Mass (lbs): 109.2 lbs Total Body Water (lbs): 83.6 lbs Visceral Fat Rating : 19   Other Clinical Data Fasting: No Labs: No Today's Visit #: #5 Starting Date: 10/03/23   Objective:   PHYSICAL EXAM: Blood pressure 135/85, pulse 76, temperature 98.7 F (37.1 C), height 5' (1.524 m), weight 232 lb (105.2 kg), SpO2 100%. Body mass index is 45.31 kg/m.  General: she is overweight, cooperative and in no acute distress. PSYCH: Has normal mood, affect and thought process.   HEENT: EOMI, sclerae are anicteric. Lungs: Normal breathing effort, no conversational dyspnea. Extremities: Moves * 4 Neurologic: A and O * 3, good insight  DIAGNOSTIC DATA REVIEWED: BMET    Component Value Date/Time   NA 138 08/29/2023 0943   NA 142 05/30/2022 1236   K 3.9 08/29/2023 0943   CL 102 08/29/2023 0943   CO2 29 08/29/2023 0943   GLUCOSE 112 (H)  08/29/2023 0943   BUN 20 08/29/2023 0943   BUN 13 05/30/2022 1236   CREATININE 1.03 08/29/2023 0943   CREATININE 1.03 (H) 05/03/2016 1152   CALCIUM  9.2 08/29/2023 0943   GFRNONAA >60 08/17/2023 1243   GFRNONAA 58 (L) 05/03/2016 1152   GFRAA 68 04/19/2020 1051   GFRAA 67 05/03/2016 1152   Lab Results  Component Value Date   HGBA1C 6.1 (H) 10/03/2023   HGBA1C 5.7 (H) 05/03/2016   Lab Results  Component Value Date   INSULIN  18.2 10/03/2023   Lab Results  Component Value Date   TSH 4.240 10/03/2023   CBC    Component Value Date/Time   WBC 4.7 08/17/2023 1243   RBC 5.55 (H) 08/17/2023 1243   HGB 12.7 08/17/2023 1243   HGB 12.1 05/30/2022 1235   HCT 41.4 08/17/2023 1243   HCT 39.2 05/30/2022 1235   PLT 268 08/17/2023 1243   PLT 312 05/30/2022 1235   MCV 74.6 (L) 08/17/2023 1243   MCV 75 (L) 05/30/2022 1235   MCH 22.9 (L) 08/17/2023 1243   MCHC 30.7 08/17/2023 1243   RDW 14.3 08/17/2023 1243  RDW 14.8 05/30/2022 1235   Iron  Studies    Component Value Date/Time   IRON  36 05/30/2022 1236   TIBC 369 05/30/2022 1236   FERRITIN 85 05/30/2022 1236   IRONPCTSAT 10 (L) 05/30/2022 1236   Lipid Panel     Component Value Date/Time   CHOL 173 10/03/2023 1004   TRIG 98 10/03/2023 1004   HDL 46 10/03/2023 1004   CHOLHDL 3.8 10/03/2023 1004   CHOLHDL 4 11/01/2021 1040   VLDL 31.0 11/01/2021 1040   LDLCALC 109 (H) 10/03/2023 1004   Hepatic Function Panel     Component Value Date/Time   PROT 7.6 08/17/2023 1243   PROT 7.0 05/30/2022 1236   ALBUMIN 4.0 08/17/2023 1243   ALBUMIN 4.3 05/30/2022 1236   AST 18 08/17/2023 1243   ALT 10 08/17/2023 1243   ALKPHOS 102 08/17/2023 1243   BILITOT 0.4 08/17/2023 1243   BILITOT 0.2 05/30/2022 1236   BILIDIR <0.1 (L) 04/27/2015 1420   IBILI NOT CALCULATED 04/27/2015 1420      Component Value Date/Time   TSH 4.240 10/03/2023 1004   Nutritional Lab Results  Component Value Date   VD25OH 33.5 10/03/2023   VD25OH 14.69 (L)  07/22/2023   VD25OH 11.51 (L) 11/01/2021    Attestations:   I, Special Puri, acting as a Stage manager for Jennifer Sensor, DO., have compiled all relevant documentation for today's office visit on behalf of Jennifer Sensor, DO, while in the presence of [employer], DO.  I have reviewed the above documentation for accuracy and completeness, and I agree with the above. Jennifer Davila, D.O.  The 21st Century Cures Act was signed into law in 2016 which includes the topic of electronic health records.  This provides immediate access to information in MyChart.  This includes consultation notes, operative notes, office notes, lab results and pathology reports.  If you have any questions about what you read please let us  know at your next visit so we can discuss your concerns and take corrective action if need be.  We are right here with you.

## 2023-12-18 ENCOUNTER — Encounter: Payer: 59 | Attending: Physical Medicine and Rehabilitation | Admitting: Physical Medicine and Rehabilitation

## 2023-12-18 ENCOUNTER — Encounter: Payer: Self-pay | Admitting: Physical Medicine and Rehabilitation

## 2023-12-18 VITALS — BP 135/78 | HR 83 | Wt 232.0 lb

## 2023-12-18 DIAGNOSIS — M79604 Pain in right leg: Secondary | ICD-10-CM | POA: Diagnosis not present

## 2023-12-18 DIAGNOSIS — G609 Hereditary and idiopathic neuropathy, unspecified: Secondary | ICD-10-CM | POA: Diagnosis not present

## 2023-12-18 DIAGNOSIS — M79605 Pain in left leg: Secondary | ICD-10-CM

## 2023-12-18 MED ORDER — CAPSAICIN-CLEANSING GEL 8 % EX KIT
4.0000 | PACK | Freq: Once | CUTANEOUS | Status: AC
Start: 2023-12-18 — End: 2023-12-18
  Administered 2023-12-18: 4 via TOPICAL

## 2023-12-18 NOTE — Progress Notes (Signed)
 Neura zen x nerve support? Reviewed medication, should be OK, patient takes no other concurrent supplements.   HPI: Jennifer Davila is a 68 y.o. female with PMHx has Hip pain; Hypertension, essential, benign; Hyperlipidemia; Morbid obesity (HCC); Vertigo; Leg cramping; Vitamin D  deficiency, unspecified; GERD (gastroesophageal reflux disease); Leg pain; Bilateral lower extremity edema; Lower GI bleed; Hypokalemia; Constipation; Diverticulosis of colon with hemorrhage; Hematochezia; Iron  deficiency anemia; Chronic urticaria; Seasonal and perennial allergic rhinitis; Positive ANA (antinuclear antibody); Bilateral leg pain; Prediabetes; GI bleed; COVID-19 virus infection; Nephrolithiasis; Gait difficulty; Osteopenia; Hereditary and idiopathic peripheral neuropathy; Skin pruritus; Dieting; Adjustment disorder with mixed anxiety and depressed mood; and Adjustment insomnia on their problem list. who presents to clinic for treatment of pain related to peripheral neuropathy with qutenza  patches as described below.   No new concerns or complaints. No major changes in medical history since last visit.   Physical Exam:  General: Appropriate appearance for age.  Mental Status: Appropriate mood and affect.  Cardiovascular: RRR, no m/r/g.  Respiratory: CTAB, no rales/rhonchi/wheezing.  Skin: No apparent rashes or lesions.  Neuro: Awake, alert, and oriented x3.  Sensory loss in stocking pattern in bilateral lower extremities.  MSK:  Moving all 4 limbs antigravity and against resistance.   PROCEDURE:  Qutenza  Patch application Diagnosis:    ICD-10-CM   1. Hereditary and idiopathic peripheral neuropathy  G60.9 capsaicin  topical system 8 % patch 4 patch    2. Bilateral leg pain  M79.604    M79.605       Goals with treatment: [ x ] Decrease pain [  ] Improve Active / Passive ROM [ x ] Improve ADLs [ x ] Improve functional mobility  MEDICATION:  [ x ] Qutenza  8% topical capsaicin  - 4  patches   CONSENT: Obtained in writing per policy. Consent uploaded to chart.  Benefits discussed.  Risks discussed included, but were not limited to, pain and discomfort, bleeding, bruising, allergic reaction, infection. All questions answered to patient/family member/guardian/ caregiver satisfaction. They would like to proceed with procedure. There are no noted contraindications to procedure.  PROCEDURE Time out was preformed No heat sources No antibiotics  The patient was explained about both the benefits and risks of a capsaicin  8% patch application. After the patient acknowledged an understanding of the risks and benefits, the patient agreed to proceed. 2 patches of Qutenza  were applied to the area of pain in each mid-shin and foot.  Blood pressure was monitored every 15 minutes. The patient tolerated the procedure well for 30 minutes until patches were removed and post-patch cream was applies.   No complications were encountered. The patient tolerated the procedure well.  Impression: HPI: Jennifer Davila is a 68 y.o. female with PMHx has Hip pain; Hypertension, essential, benign; Hyperlipidemia; Morbid obesity (HCC); Vertigo; Leg cramping; Vitamin D  deficiency, unspecified; GERD (gastroesophageal reflux disease); Leg pain; Bilateral lower extremity edema; Lower GI bleed; Hypokalemia; Constipation; Diverticulosis of colon with hemorrhage; Hematochezia; Iron  deficiency anemia; Chronic urticaria; Seasonal and perennial allergic rhinitis; Positive ANA (antinuclear antibody); Bilateral leg pain; Prediabetes; GI bleed; COVID-19 virus infection; Nephrolithiasis; Gait difficulty; Osteopenia; Hereditary and idiopathic peripheral neuropathy; Skin pruritus; Dieting; Adjustment disorder with mixed anxiety and depressed mood; and Adjustment insomnia on their problem list. who presents to clinic for treatment of pain related to peripheral neuropathy with qutenza  patches as described below.    No new  concerns or complaints. No major changes in medical history since last visit.   PLAN: - Resume Usual Activities. Notify  Physician of any unusual bleeding, erythema or concern for side effects as reviewed above. - Apply ice prn for pain - Tylenol  prn for pain  Follow up every 3 months for Qutenza  as beneficial; can stretch treatments to every 6 months or every year depending on length of benefit  Avoid hot or cold water with showers/baths for the next 3 days; keep temperatures lukewarm. Wash treated areas with soap and water when first able today. You may have ongoing burning over treatment areas for a few days after your visit. Call if this becomes severe or if you notice any blistering of your skin.   Reviewed Neura Zen X supplements, should be OK to try. Stay within dosing recommendations and do not take supplements that overlap with those on the ingredients list. Please reach out of any questions or concerns.  She is well controlled in her blood sugars and compliant with follow ups.   Patient/Care Jennifer Davila was ready to learn without apparent learning barriers. Education was provided on diagnosis, treatment options/plan according to patient's preferred learning style. Patient/Care Giver verbalized understanding and agreement with the above plan.   Bea Lime, DO 12/18/2023

## 2023-12-18 NOTE — Patient Instructions (Addendum)
 Follow up every 3 months for Qutenza  as beneficial; can stretch treatments to every 6 months or every year depending on length of benefit  Avoid hot or cold water with showers/baths for the next 3 days; keep temperatures lukewarm. Wash treated areas with soap and water when first able today. You may have ongoing burning over treatment areas for a few days after your visit. Call if this becomes severe or if you notice any blistering of your skin.   Reviewed Neura Zen X supplements, should be OK to try. Stay within dosing recommendations and do not take supplements that overlap with those on the ingredients list. Please reach out of any questions or concerns.  She is well controlled in her blood sugars and compliant with follow ups.

## 2023-12-23 NOTE — Progress Notes (Unsigned)
 SUBJECTIVE: Discussed the use of AI scribe software for clinical note transcription with the patient, who gave verbal consent to proceed.  Chief Complaint: Obesity  Interim History: She is up 4 pounds since her last visit. Down 12 pounds overall TBW loss of 4.8%  Jennifer Davila is here to discuss her progress with her obesity treatment plan. She is on the Category 1 Plan and states she is following her eating plan approximately 95 % of the time. She states she is exercising 15 minutes 5 times per week.  Jennifer L Gosa "Jackie" is a 68 year old female with obesity who presents for follow-up of her obesity treatment plan.  She has gained four pounds since her last visit, attributing this to dietary choices made during the holiday period, including consuming fried fish, [place] hot dogs, and yogurt-covered raisins. She initially thought her weight gain was ten pounds, so she is surprised that it was not more significant.  She is currently on a category one diet plan, adhering to it 95% of the time. Her typical breakfast includes one slice of 45-calorie bread, a slice of cheese, one egg, and sometimes cottage cheese for additional protein. Lunch usually consists of a sandwich made with 45-calorie bread, lettuce, tomato, and [place] or chicken and another cottage cheese. Dinner includes vegetables such as squash, carrots, corn, or broccoli, paired with [place] or chicken. She sometimes consumes cottage cheese or oranges as snacks after dinner. She is adding the cottage cheese not necessarily out of hunger, but concerns she will not meet her protein needs.   She walks for fifteen minutes five times per week but has not yet started resistance training. She consumes [place] bacon and [place] sausage as part of her breakfast routine. She is mindful of her fruit intake, preferring fruits like blueberries, peaches, and melons, which are lower in sugar, and is cautious about consuming too many oranges due to  their sugar content.  OBJECTIVE: Visit Diagnoses: Problem List Items Addressed This Visit     Hypertension, essential, benign   Morbid obesity (HCC)   Vitamin D  deficiency, unspecified   Relevant Medications   Vitamin D , Ergocalciferol , (DRISDOL ) 1.25 MG (50000 UNIT) CAPS capsule   Prediabetes - Primary   Other Visit Diagnoses       BMI 45.0-49.9, adult (HCC) current BMI 46.2         Obesity Obesity management is ongoing. She has gained four pounds since the last visit, attributed to dietary choices during a holiday period. She adheres to a category one plan with breakfast and lunch options 95% of the time. She walks for fifteen minutes five times per week but has not started resistance training. Discussion focused on dietary choices, particularly the impact of sodium and sugar intake on weight and blood pressure. Emphasized the importance of protein intake and mindful caloric intake, especially from cottage cheese and high-sugar fruits. Highlighted the importance of meal planning and preparation to maintain adherence to the dietary plan. - Encourage adherence to category one plan with focus on protein intake. - Advise continuation of walking for fifteen minutes five times per week. - Encourage initiation of resistance training. - Educate on reducing sodium and sugar intake, particularly from processed foods and high-sugar fruits. - Suggest using cottage cheese as a protein source strategically to avoid excess caloric intake. - Emphasize meal planning and preparation to maintain dietary adherence.  Hypertension Blood pressure was elevated today, potentially due to dietary sodium intake from [place] hot dogs over the holiday weekend.Jennifer Davila  Discussed the hidden sodium content in processed foods and its impact on blood pressure and fluid retention. She is on spironolactone , which may help with fluid retention, but excessive sodium intake can counteract its effects.  She is also on  amlodipine -olmesartan  5/40 mg once daily. No reported side effects with medications BP Readings from Last 3 Encounters:  12/24/23 (!) 150/80  12/18/23 135/78  12/03/23 135/85   Lab Results  Component Value Date   NA 138 08/29/2023   CL 102 08/29/2023   K 3.9 08/29/2023   CO2 29 08/29/2023   BUN 20 08/29/2023   CREATININE 1.03 08/29/2023   GFR 55.35 (L) 08/29/2023   CALCIUM  9.2 08/29/2023   ALBUMIN 4.0 08/17/2023   GLUCOSE 112 (H) 08/29/2023    - Advise reduction of sodium intake, particularly from processed foods like [place] hot dogs. - Continue spironolactone  for blood pressure management.  Prediabetes Prediabetes management is focused on dietary modifications to control blood sugar levels. Discussed the impact of sugar intake from fruits and other foods on insulin  production and fat storage.  Lab Results  Component Value Date   HGBA1C 6.1 (H) 10/03/2023   HGBA1C 6.3 07/22/2023   HGBA1C 6.1 (H) 05/30/2022   Lab Results  Component Value Date   LDLCALC 109 (H) 10/03/2023   CREATININE 1.03 08/29/2023   Emphasized the importance of choosing fruits lower in sugar and monitoring overall sugar intake to prevent progression to diabetes. - Advise on choosing fruits lower in sugar, such as berries and melons, and limiting high-sugar fruits like bananas and pineapples. - Educate on the relationship between sugar intake, insulin  production, and fat storage. Continue working on nutrition plan to decrease simple carbohydrates, increase lean proteins and exercise to promote weight loss, improve glycemic control and prevent progression to Type 2 diabetes.    Vitamin D  Deficiency Vitamin D  is not at goal of 50.  Most recent vitamin D  level was 33.5. She is on  prescription ergocalciferol  50,000 IU weekly.  No nausea, vomiting or muscle weakness with ergocalciferol . Lab Results  Component Value Date   VD25OH 33.5 10/03/2023   VD25OH 14.69 (L) 07/22/2023   VD25OH 11.51 (L) 11/01/2021     Plan: Continue and refill  prescription ergocalciferol  50,000 IU weekly Low vitamin D  levels can be associated with adiposity and may result in leptin resistance and weight gain. Also associated with fatigue.  Currently on vitamin D  supplementation without any adverse effects such as nausea, vomiting or muscle weakness.  Meds ordered this encounter  Medications   Vitamin D , Ergocalciferol , (DRISDOL ) 1.25 MG (50000 UNIT) CAPS capsule    Sig: Take 1 capsule (50,000 Units total) by mouth every 7 (seven) days.    Dispense:  4 capsule    Refill:  0    Vitals Temp: 98.3 F (36.8 C) BP: (!) 150/80 Pulse Rate: 77 SpO2: 99 %   Anthropometric Measurements Height: 5' (1.524 m) Weight: 236 lb (107 kg) BMI (Calculated): 46.09 Weight at Last Visit: 232lb Weight Lost Since Last Visit: 0 Weight Gained Since Last Visit: 4lb Starting Weight: 248lb Total Weight Loss (lbs): 12 lb (5.443 kg) Peak Weight: 250lb   Body Composition  Body Fat %: 53.2 % Fat Mass (lbs): 125.8 lbs Muscle Mass (lbs): 105.2 lbs Visceral Fat Rating : 20   Other Clinical Data Fasting: no Labs: no Today's Visit #: 6 Starting Date: 10/03/23     ASSESSMENT AND PLAN:  Diet: Jennifer Davila is currently in the action stage of change. As such, her  goal is to continue with weight loss efforts. She has agreed to Category 1 Plan and with breakfast and lunch options.  Exercise: Dawna has been instructed to work up to a goal of 150 minutes of combined cardio and strengthening exercise per week for weight loss and overall health benefits.   Behavior Modification:  We discussed the following Behavioral Modification Strategies today: increasing lean protein intake, decreasing simple carbohydrates, increasing vegetables, increase H2O intake, increase high fiber foods, meal planning and cooking strategies, better snacking choices, avoiding temptations, and planning for success. We discussed various medication  options to help Brynlea with her weight loss efforts and we both agreed to continue to work on nutritional and behavioral strategies to promote weight loss.  .  Return in about 4 weeks (around 01/21/2024).Jennifer Davila She was informed of the importance of frequent follow up visits to maximize her success with intensive lifestyle modifications for her multiple health conditions.  Attestation Statements:   Reviewed by clinician on day of visit: allergies, medications, problem list, medical history, surgical history, family history, social history, and previous encounter notes.   Time spent on visit including pre-visit chart review and post-visit care and charting was 28 minutes.    Jennifer Kuriakose, PA-C

## 2023-12-24 ENCOUNTER — Ambulatory Visit (INDEPENDENT_AMBULATORY_CARE_PROVIDER_SITE_OTHER): Admitting: Physician Assistant

## 2023-12-24 ENCOUNTER — Encounter (INDEPENDENT_AMBULATORY_CARE_PROVIDER_SITE_OTHER): Payer: Self-pay | Admitting: Physician Assistant

## 2023-12-24 VITALS — BP 150/80 | HR 77 | Temp 98.3°F | Ht 60.0 in | Wt 236.0 lb

## 2023-12-24 DIAGNOSIS — E538 Deficiency of other specified B group vitamins: Secondary | ICD-10-CM

## 2023-12-24 DIAGNOSIS — I10 Essential (primary) hypertension: Secondary | ICD-10-CM | POA: Diagnosis not present

## 2023-12-24 DIAGNOSIS — E785 Hyperlipidemia, unspecified: Secondary | ICD-10-CM

## 2023-12-24 DIAGNOSIS — E559 Vitamin D deficiency, unspecified: Secondary | ICD-10-CM | POA: Diagnosis not present

## 2023-12-24 DIAGNOSIS — R7303 Prediabetes: Secondary | ICD-10-CM | POA: Diagnosis not present

## 2023-12-24 DIAGNOSIS — Z6841 Body Mass Index (BMI) 40.0 and over, adult: Secondary | ICD-10-CM

## 2023-12-24 MED ORDER — VITAMIN D (ERGOCALCIFEROL) 1.25 MG (50000 UNIT) PO CAPS: 50000.0000 [IU] | ORAL_CAPSULE | ORAL | 0 refills | Status: DC

## 2024-01-02 DIAGNOSIS — I10 Essential (primary) hypertension: Secondary | ICD-10-CM | POA: Diagnosis not present

## 2024-01-02 DIAGNOSIS — H25811 Combined forms of age-related cataract, right eye: Secondary | ICD-10-CM | POA: Diagnosis not present

## 2024-01-16 DIAGNOSIS — H25812 Combined forms of age-related cataract, left eye: Secondary | ICD-10-CM | POA: Diagnosis not present

## 2024-01-16 DIAGNOSIS — I10 Essential (primary) hypertension: Secondary | ICD-10-CM | POA: Diagnosis not present

## 2024-01-21 ENCOUNTER — Ambulatory Visit (INDEPENDENT_AMBULATORY_CARE_PROVIDER_SITE_OTHER): Admitting: Family Medicine

## 2024-01-22 ENCOUNTER — Ambulatory Visit: Payer: No Typology Code available for payment source | Admitting: Family Medicine

## 2024-02-05 ENCOUNTER — Ambulatory Visit: Payer: Self-pay

## 2024-02-05 NOTE — Telephone Encounter (Signed)
 FYI Only or Action Required?: FYI only for provider.  Patient was last seen in primary care on 12/03/2023 by Midge Sober, DO.  Called Nurse Triage reporting Hematuria.  Symptoms began a week ago.  Interventions attempted: Nothing.  Symptoms are: stable.  Triage Disposition: See Physician Within 24 Hours  Patient/caregiver understands and will follow disposition?:                              Copied from CRM [id number]. Topic: Clinical - Red Word Triage >> Feb 05, 2024 12:05 PM Larissa RAMAN wrote: [facility] that prompted transfer to Nurse Triage: bleeding with urination  ----------------------------------------------------------------------- From previous Reason for Contact - Scheduling: Patient/patient representative is calling to schedule an appointment. Refer to attachments for appointment information.  1. COLOR of URINE: Describe the color of the urine. (e.g., tea-colored, pink, red, bloody) Do you have blood clots in your urine? (e.g., none, pea, grape, small coin) Bright red, states amount has decreased over the week  2. ONSET: When did the bleeding start?  A week ago  3. EPISODES: How many times has there been blood in the urine? or How many times today? States blood is present when she wipes  4. PAIN with URINATION: Is there any pain with passing your urine? If Yes, ask: How bad is the pain? (Scale 1-10; or mild, moderate, severe) - MILD: Complains slightly about urination hurting. - MODERATE: Interferes with normal activities.  - SEVERE: Excruciating, unwilling or unable to urinate because of the pain.  Denies  5. FEVER: Do you have a fever? If Yes, ask: What is your temperature, how was it measured, and when did it start? Denies  6. ASSOCIATED SYMPTOMS: Are you passing urine more frequently than usual? Urinary frequency at baseline, according to patient  7. OTHER SYMPTOMS: Do you have any other symptoms? (e.g.,  back/flank pain, abdomen pain, vomiting) Mild stomach discomfort, denies flank pain  Reason for Disposition  Blood in urine  (Exception: Could be normal menstrual bleeding.)  Protocols used: Urine - Blood In-A-AH

## 2024-02-06 ENCOUNTER — Encounter: Payer: Self-pay | Admitting: Internal Medicine

## 2024-02-06 ENCOUNTER — Ambulatory Visit (INDEPENDENT_AMBULATORY_CARE_PROVIDER_SITE_OTHER): Admitting: Internal Medicine

## 2024-02-06 ENCOUNTER — Telehealth: Payer: Self-pay | Admitting: Family Medicine

## 2024-02-06 VITALS — BP 130/82 | HR 76 | Temp 98.3°F | Wt 236.8 lb

## 2024-02-06 DIAGNOSIS — R319 Hematuria, unspecified: Secondary | ICD-10-CM

## 2024-02-06 LAB — POC URINALSYSI DIPSTICK (AUTOMATED)
Bilirubin, UA: NEGATIVE
Glucose, UA: NEGATIVE
Ketones, UA: NEGATIVE
Leukocytes, UA: NEGATIVE
Nitrite, UA: NEGATIVE
Protein, UA: NEGATIVE
Spec Grav, UA: 1.025 (ref 1.010–1.025)
Urobilinogen, UA: 0.2 U/dL
pH, UA: 6 (ref 5.0–8.0)

## 2024-02-06 LAB — URINALYSIS, ROUTINE W REFLEX MICROSCOPIC
Bilirubin Urine: NEGATIVE
Ketones, ur: NEGATIVE
Leukocytes,Ua: NEGATIVE
Nitrite: NEGATIVE
Specific Gravity, Urine: 1.025 (ref 1.000–1.030)
Total Protein, Urine: NEGATIVE
Urine Glucose: NEGATIVE
Urobilinogen, UA: 0.2 (ref 0.0–1.0)
pH: 6 (ref 5.0–8.0)

## 2024-02-06 MED ORDER — SULFAMETHOXAZOLE-TRIMETHOPRIM 800-160 MG PO TABS: 1.0000 | ORAL_TABLET | Freq: Two times a day (BID) | ORAL | 0 refills | Status: AC

## 2024-02-06 NOTE — Progress Notes (Signed)
 Established Patient Office Visit     CC/Reason for Visit: Blood in urine  HPI: Jennifer Davila is a 68 y.o. female who is coming in today for the above mentioned reasons.  Has been having hematuria for the past 2 weeks.  This is painless.  No dysuria, no urinary frequency, no back or flank pain.  No fevers.   Past Medical/Surgical History: Past Medical History:  Diagnosis Date   Arthritis    Constipation    on stool softener   GERD (gastroesophageal reflux disease)    GERD (gastroesophageal reflux disease)    High blood pressure    High cholesterol    Hyperlipidemia    Hypertension    Joint pain    Kidney stones    Pre-diabetes    Rheumatoid arthritis (HCC)    Trouble swallowing 2013   following lap band   Urticaria    Vitamin D  deficiency     Past Surgical History:  Procedure Laterality Date   ABDOMINAL HYSTERECTOMY     BREAST BIOPSY Left 11/04/2019    BENIGN LYMPH NODE WITH PARACORTICAL HYPERPLASIA   CHOLECYSTECTOMY     COLONOSCOPY N/A 01/08/2018   Procedure: COLONOSCOPY;  Surgeon: Leigh Elspeth SQUIBB, MD;  Location: [facility];  Service: Gastroenterology;  Laterality: N/A;   LAPAROSCOPIC GASTRIC BANDING     LAPAROSCOPIC REPAIR AND REMOVAL OF GASTRIC BAND  2013-2014    Social History:  reports that she quit smoking about 51 years ago. Her smoking use included cigarettes. She has never used smokeless tobacco. She reports that she does not drink alcohol and does not use drugs.  Allergies: No Known Allergies  Family History:  Family History  Problem Relation Age of Onset   Hypertension Mother    Stroke Mother    Hypertension Father    Diabetes Father    Alcohol abuse Father    Kidney disease Father    Colon polyps Brother    Cancer - Colon Brother    Alcohol abuse Brother    Hypertension Son    Heart disease Maternal Grandmother    Colon cancer Neg Hx    Esophageal cancer Neg Hx    Rectal cancer Neg Hx    Stomach cancer Neg Hx       Current Outpatient Medications:    acetaminophen  (TYLENOL ) 500 MG tablet, Take 1-2 tablets (500-1,000 mg total) by mouth daily as needed for moderate pain (pain)., Disp: 60 tablet, Rfl: 1   amLODipine -olmesartan  (AZOR ) 5-40 MG tablet, Take 1 tablet by mouth daily., Disp: 90 tablet, Rfl: 2   ibuprofen  (ADVIL ) 400 MG tablet, Take 1 tablet (400 mg total) by mouth once a week., Disp: 30 tablet, Rfl: 0   methocarbamol  (ROBAXIN ) 750 MG tablet, TAKE 1 TABLET BY MOUTH EVERY 6 (SIX) HOURS AS NEEDED FOR MUSCLE SPASMS (POSTERIOR THIGH PAIN)., Disp: 90 tablet, Rfl: 2   omeprazole  (PRILOSEC) 20 MG capsule, TAKE 1 CAPSULE BY MOUTH EVERY DAY, Disp: 90 capsule, Rfl: 1   polyethylene glycol (MIRALAX  / GLYCOLAX ) 17 g packet, Take 17 g by mouth every other day., Disp: , Rfl:    pravastatin  (PRAVACHOL ) 20 MG tablet, TAKE 1 TABLET BY MOUTH EVERY DAY, Disp: 90 tablet, Rfl: 3   QUTENZA , 4 PATCH, 8 %, APPLY 4 PATCHES TOPICALLY AT DOCTOR OFFICE EVERY 90 DAYS, Disp: 4 patch, Rfl: 0   spironolactone  (ALDACTONE ) 25 MG tablet, Take 1 tablet (25 mg total) by mouth daily., Disp: 90 tablet, Rfl: 1   sulfamethoxazole -trimethoprim  (  BACTRIM  DS) 800-160 MG tablet, Take 1 tablet by mouth 2 (two) times daily for 7 days., Disp: 14 tablet, Rfl: 0   traZODone  (DESYREL ) 50 MG tablet, TAKE 1/2 TO 1 TABLET (25 TO 50 MG TOTAL) BY MOUTH AT BEDTIME, Disp: 30 tablet, Rfl: 2   Vitamin D , Ergocalciferol , (DRISDOL ) 1.25 MG (50000 UNIT) CAPS capsule, Take 1 capsule (50,000 Units total) by mouth every 7 (seven) days., Disp: 4 capsule, Rfl: 0  Review of Systems:  Negative unless indicated in HPI.   Physical Exam: Vitals:   02/06/24 0911  BP: 130/82  Pulse: 76  Temp: 98.3 F (36.8 C)  TempSrc: Oral  SpO2: 99%  Weight: 236 lb 12.8 oz (107.4 kg)    Body mass index is 46.25 kg/m.    Impression and Plan:  Hematuria, unspecified type -     POCT Urinalysis Dipstick (Automated) -     Urine Culture -     Urinalysis; Future -      Sulfamethoxazole -Trimethoprim ; Take 1 tablet by mouth 2 (two) times daily for 7 days.  Dispense: 14 tablet; Refill: 0   - In office urine culture only positive for blood.  Will treat as UTI with Bactrim .  Have advised to follow-up in 2 to 3 weeks for repeat UA.  If hematuria still present consider imaging to rule out nephrolithiasis or other pathologies of the kidneys.  Time spent:30 minutes reviewing chart, interviewing and examining patient and formulating plan of care.     Jennifer Theophilus Andrews, MD  Primary Care at [facility]

## 2024-02-07 LAB — URINE CULTURE
MICRO NUMBER:: 16682514
Result:: NO GROWTH
SPECIMEN QUALITY:: ADEQUATE

## 2024-02-17 ENCOUNTER — Ambulatory Visit (INDEPENDENT_AMBULATORY_CARE_PROVIDER_SITE_OTHER): Admitting: Family Medicine

## 2024-02-18 ENCOUNTER — Encounter (INDEPENDENT_AMBULATORY_CARE_PROVIDER_SITE_OTHER): Payer: Self-pay | Admitting: Family Medicine

## 2024-02-18 ENCOUNTER — Ambulatory Visit (INDEPENDENT_AMBULATORY_CARE_PROVIDER_SITE_OTHER): Admitting: Family Medicine

## 2024-02-18 VITALS — BP 142/81 | HR 79 | Temp 98.7°F | Ht 60.0 in | Wt 232.0 lb

## 2024-02-18 DIAGNOSIS — E65 Localized adiposity: Secondary | ICD-10-CM

## 2024-02-18 DIAGNOSIS — Z6841 Body Mass Index (BMI) 40.0 and over, adult: Secondary | ICD-10-CM

## 2024-02-18 DIAGNOSIS — E559 Vitamin D deficiency, unspecified: Secondary | ICD-10-CM | POA: Diagnosis not present

## 2024-02-18 DIAGNOSIS — I10 Essential (primary) hypertension: Secondary | ICD-10-CM

## 2024-02-18 DIAGNOSIS — R7303 Prediabetes: Secondary | ICD-10-CM

## 2024-02-18 MED ORDER — VITAMIN D (ERGOCALCIFEROL) 1.25 MG (50000 UNIT) PO CAPS: 50000.0000 [IU] | ORAL_CAPSULE | ORAL | 0 refills | Status: DC

## 2024-02-18 NOTE — Progress Notes (Signed)
 Jennifer Davila, D.O.  ABFM, ABOM Specializing in Clinical Bariatric Medicine  Office located at: [address] [place], [place]  [address]   Assessment and Plan:   Medications Discontinued During This Encounter  Medication Reason   Vitamin D , Ergocalciferol , (DRISDOL ) 1.25 MG (50000 UNIT) CAPS capsule Reorder     Meds ordered this encounter  Medications   Vitamin D , Ergocalciferol , (DRISDOL ) 1.25 MG (50000 UNIT) CAPS capsule    Sig: Take 1 capsule (50,000 Units total) by mouth every 7 (seven) days.    Dispense:  4 capsule    Refill:  0     Recheck A1c, BMP, and Vit D next OV   FOR THE DISEASE OF OBESITY:  BMI 45.0-49.9, adult (HCC) current BMI 45.31 Morbid obesity (HCC) - start BMI 48.43 Assessment & Plan: Since last office visit on 12/24/2023 patient's muscle mass has increased by 0.6 lbs. Fat mass has decreased by 5 lbs. Total body water is 83.2 lbs  Counseling done on how various foods will affect these numbers and how to maximize success  Total lbs lost to date: -16  lbs Total weight loss percentage to date: -6.45 %   Recommended Dietary Goals Jennifer Davila is currently in the action stage of change. As such, her goal is to continue weight management plan.  She has agreed to: continue current plan   Behavioral Intervention We discussed the following today: continue to work on maintaining a reduced calorie state, getting the recommended amount of protein, incorporating whole foods, making healthy choices, staying well hydrated and practicing mindfulness when eating.  Additional resources provided today: n/a  Evidence-based interventions for health behavior change were utilized today including the discussion of self monitoring techniques, problem-solving barriers and SMART goal setting techniques.   Regarding patient's less desirable eating habits and patterns, we employed the technique of small changes.   Pt will specifically work on: n/a    Recommended  Physical Activity Goals Jennifer Davila has been advised to work up to 300-450 minutes of moderate intensity aerobic activity a week and strengthening exercises 2-3 times per week for cardiovascular health, weight loss maintenance and preservation of muscle mass.   She was encouraged to exercise (e.g walking around track or using machines) at the [facility].   Overall, her goal for now is to do some form of exercise 20 minutes daily.    Pharmacotherapy Continue with current nutritional and behavioral strategies   ASSOCIATED CONDITIONS ADDRESSED TODAY:   Prediabetes Assessment & Plan: Lab Results  Component Value Date   HGBA1C 6.1 (H) 10/03/2023   HGBA1C 6.3 07/22/2023   HGBA1C 6.1 (H) 05/30/2022   INSULIN  18.2 10/03/2023    Prediabetes management is focused on dietary modifications and exercise to control blood sugar levels. Good control of hunger and cravings. No acute concerns.   - Continue working on nutrition plan to decrease simple carbohydrates, increase lean proteins and exercise to promote weight loss and improve glycemic control and prevent progression to T2DM. - Recheck A1c next OV.      Hypertension, essential, benign Assessment & Plan: Last 3 blood pressure readings in our office are as follows: BP Readings from Last 3 Encounters:  02/18/24 (!) 142/81  02/06/24 130/82  12/24/23 (!) 150/80   The 10-year ASCVD risk score (Arnett DK, et al., 2019) is: 12.6%  Lab Results  Component Value Date   CREATININE 1.03 08/29/2023   HTN treated with Spironolactone  25 mg daily and Amlodipine -Olmesartan  5/40 mg daily. BP is above target, potentially  due to white coat syndrome and pain from swelling in her legs. Pt asx. She is not checking her BP at home.   - Maintain with both antihypertensives.  - Check BP at home 2-3 times/weekly.  - Continue to work on nutrition plan to promote weight loss and improve BP control.  - Advance exercise as tolerated (see exercise goal  above)    Visceral obesity Assessment & Plan: Current visceral fat rating: 20.  The visceral fat rating should be < 12 in a female.  - Visceral adipose tissue is a hormonally active component of total body fat. This body composition phenotype is associated with medical disorders such as metabolic syndrome, cardiovascular disease and several malignancies including prostate, breast, and colorectal cancers. - Goal: Lose 7-10% of weight via prudent nutritional plan and lifestyle changes.      Vitamin D  deficiency, unspecified Assessment & Plan: Lab Results  Component Value Date   VD25OH 33.5 10/03/2023   VD25OH 14.69 (L) 07/22/2023   VD25OH 11.51 (L) 11/01/2021   She is on prescription ergocalciferol  50,000 IU weekly.  No nausea, vomiting or muscle weakness with ergocalciferol .   - Continue and refill prescription vit D - Weight loss will likely improve availability of vitamin D , thus encouraged Jennifer Davila to continue with meal plan and their weight loss efforts to further improve this condition. - Recheck next OV    Follow up:   Return 03/11/2024 at 9:00 AM with [name], MD.  She was informed of the importance of frequent follow up visits to maximize her success with intensive lifestyle modifications for her multiple health conditions.   Subjective:   Chief complaint: Obesity Jennifer Davila is here to discuss her progress with her obesity treatment plan. She is on the Category 1 Plan with B & L options .   Interval History:  Jennifer Davila is here for a follow up office visit. Since last OV on 12/24/2023 , she is down 4 lbs. States she is following her eating plan approximately 50% of the time. She states she is walking 20 minutes 2 days per week.   Pharmacotherapy that aid with weight loss: none   Review of Systems:  Pertinent positives were addressed with patient today.  Reviewed by clinician on day of visit: allergies, medications, problem list, medical  history, surgical history, family history, social history, and previous encounter notes.   Weight Summary and Biometrics   Weight Lost Since Last Visit: 4lb  Weight Gained Since Last Visit: 0lb  Vitals Temp: 98.7 F (37.1 C) BP: (!) 142/81 Pulse Rate: 79 SpO2: 98 %   Anthropometric Measurements Height: 5' (1.524 m) Weight: 232 lb (105.2 kg) BMI (Calculated): 45.31 Weight at Last Visit: 236lb Weight Lost Since Last Visit: 4lb Weight Gained Since Last Visit: 0lb Starting Weight: 248lb Total Weight Loss (lbs): 16 lb (7.258 kg) Peak Weight: 250lb   Body Composition  Body Fat %: 52 % Fat Mass (lbs): 120.8 lbs Muscle Mass (lbs): 105.8 lbs Total Body Water (lbs): 83.2 lbs Visceral Fat Rating : 20   Other Clinical Data Fasting: No Labs: no Today's Visit #: 7 Starting Date: 10/03/23    Objective:   PHYSICAL EXAM: Blood pressure (!) 142/81, pulse 79, temperature 98.7 F (37.1 C), height 5' (1.524 m), weight 232 lb (105.2 kg), SpO2 98%. Body mass index is 45.31 kg/m.  General: she is overweight, cooperative and in no acute distress. PSYCH: Has normal mood, affect and thought process.   HEENT: EOMI, sclerae are anicteric.  Lungs: Normal breathing effort, no conversational dyspnea. Extremities: Moves * 4 Neurologic: A and O * 3, good insight  DIAGNOSTIC DATA REVIEWED: BMET    Component Value Date/Time   NA 138 08/29/2023 0943   NA 142 05/30/2022 1236   K 3.9 08/29/2023 0943   CL 102 08/29/2023 0943   CO2 29 08/29/2023 0943   GLUCOSE 112 (H) 08/29/2023 0943   BUN 20 08/29/2023 0943   BUN 13 05/30/2022 1236   CREATININE 1.03 08/29/2023 0943   CREATININE 1.03 (H) 05/03/2016 1152   CALCIUM  9.2 08/29/2023 0943   GFRNONAA >60 08/17/2023 1243   GFRNONAA 58 (L) 05/03/2016 1152   GFRAA 68 04/19/2020 1051   GFRAA 67 05/03/2016 1152   Lab Results  Component Value Date   HGBA1C 6.1 (H) 10/03/2023   HGBA1C 5.7 (H) 05/03/2016   Lab Results  Component Value  Date   INSULIN  18.2 10/03/2023   Lab Results  Component Value Date   TSH 4.240 10/03/2023   CBC    Component Value Date/Time   WBC 4.7 08/17/2023 1243   RBC 5.55 (H) 08/17/2023 1243   HGB 12.7 08/17/2023 1243   HGB 12.1 05/30/2022 1235   HCT 41.4 08/17/2023 1243   HCT 39.2 05/30/2022 1235   PLT 268 08/17/2023 1243   PLT 312 05/30/2022 1235   MCV 74.6 (L) 08/17/2023 1243   MCV 75 (L) 05/30/2022 1235   MCH 22.9 (L) 08/17/2023 1243   MCHC 30.7 08/17/2023 1243   RDW 14.3 08/17/2023 1243   RDW 14.8 05/30/2022 1235   Iron  Studies    Component Value Date/Time   IRON  36 05/30/2022 1236   TIBC 369 05/30/2022 1236   FERRITIN 85 05/30/2022 1236   IRONPCTSAT 10 (L) 05/30/2022 1236   Lipid Panel     Component Value Date/Time   CHOL 173 10/03/2023 1004   TRIG 98 10/03/2023 1004   HDL 46 10/03/2023 1004   CHOLHDL 3.8 10/03/2023 1004   CHOLHDL 4 11/01/2021 1040   VLDL 31.0 11/01/2021 1040   LDLCALC 109 (H) 10/03/2023 1004   Hepatic Function Panel     Component Value Date/Time   PROT 7.6 08/17/2023 1243   PROT 7.0 05/30/2022 1236   ALBUMIN 4.0 08/17/2023 1243   ALBUMIN 4.3 05/30/2022 1236   AST 18 08/17/2023 1243   ALT 10 08/17/2023 1243   ALKPHOS 102 08/17/2023 1243   BILITOT 0.4 08/17/2023 1243   BILITOT 0.2 05/30/2022 1236   BILIDIR <0.1 (L) 04/27/2015 1420   IBILI NOT CALCULATED 04/27/2015 1420      Component Value Date/Time   TSH 4.240 10/03/2023 1004   Nutritional Lab Results  Component Value Date   VD25OH 33.5 10/03/2023   VD25OH 14.69 (L) 07/22/2023   VD25OH 11.51 (L) 11/01/2021    Attestations:   I, Special Puri, acting as a Stage manager for Jennifer Jenkins, DO., have compiled all relevant documentation for today's office visit on behalf of Jennifer Jenkins, DO, while in the presence of [employer], DO.  I have reviewed the above documentation for accuracy and completeness, and I agree with the above. Jennifer Davila, D.O.  The 21st Century  Cures Act was signed into law in 2016 which includes the topic of electronic health records.  This provides immediate access to information in MyChart. This includes consultation notes, operative notes, office notes, lab results and pathology reports.  If you have any questions about what you read please let us  know at your next visit so  we can discuss your concerns and take corrective action if need be.  We are right here with you.

## 2024-02-21 ENCOUNTER — Other Ambulatory Visit: Payer: Self-pay | Admitting: Physical Medicine and Rehabilitation

## 2024-02-21 DIAGNOSIS — G609 Hereditary and idiopathic neuropathy, unspecified: Secondary | ICD-10-CM

## 2024-02-28 ENCOUNTER — Encounter: Payer: Self-pay | Admitting: Family Medicine

## 2024-02-28 ENCOUNTER — Ambulatory Visit: Admitting: Family Medicine

## 2024-02-28 VITALS — BP 122/80 | HR 75 | Resp 16 | Ht 60.0 in | Wt 238.0 lb

## 2024-02-28 DIAGNOSIS — I10 Essential (primary) hypertension: Secondary | ICD-10-CM

## 2024-02-28 DIAGNOSIS — M79605 Pain in left leg: Secondary | ICD-10-CM | POA: Diagnosis not present

## 2024-02-28 DIAGNOSIS — R310 Gross hematuria: Secondary | ICD-10-CM

## 2024-02-28 DIAGNOSIS — M79604 Pain in right leg: Secondary | ICD-10-CM | POA: Diagnosis not present

## 2024-02-28 LAB — URINALYSIS, ROUTINE W REFLEX MICROSCOPIC
Bilirubin Urine: NEGATIVE
Ketones, ur: NEGATIVE
Leukocytes,Ua: NEGATIVE
Nitrite: NEGATIVE
Specific Gravity, Urine: 1.015 (ref 1.000–1.030)
Total Protein, Urine: NEGATIVE
Urine Glucose: NEGATIVE
Urobilinogen, UA: 0.2 (ref 0.0–1.0)
pH: 6 (ref 5.0–8.0)

## 2024-02-28 MED ORDER — ACETAMINOPHEN-CODEINE 300-30 MG PO TABS: 1.0000 | ORAL_TABLET | Freq: Two times a day (BID) | ORAL | 0 refills | Status: DC | PRN

## 2024-02-28 NOTE — Assessment & Plan Note (Signed)
 Chronic. Related with neuropathy most likely. She has tried different medication in the past, including Percocet, hydrocodone -acetaminophen , and gabapentin  but they had been ineffective. Today she requesting prescription for Tylenol  3, prescription sent to her pharmacy.  Instructed to let me know if medication helps with pain, in which case I will see her back in 3 to 4 months. Some side effects of medication discussed.

## 2024-02-28 NOTE — Patient Instructions (Addendum)
 A few things to remember from today's visit:  Gross hematuria - Plan: Urinalysis, Routine w reflex microscopic  Hypertension, essential, benign  Bilateral leg pain - Plan: acetaminophen -codeine  (TYLENOL  #3) 300-30 MG tablet  Tylenol  # 3 sent, take it 1-2 times daily and no more than one tylenol  if needed over the counter. Let me know if medication helps with pain.  Monitor blood pressure at home.  If you need refills for medications you take chronically, please call your pharmacy. Do not use My Chart to request refills or for acute issues that need immediate attention. If you send a my chart message, it may take a few days to be addressed, specially if I am not in the office.  Please be sure medication list is accurate. If a new problem present, please set up appointment sooner than planned today.

## 2024-02-28 NOTE — Progress Notes (Signed)
 Chief Complaint  Patient presents with   Medical Management of Chronic Issues    Only seen 1 spot of blood since appt.    HPI: Jennifer Davila is a 68 y.o. female with PMHx significant for HTN, peripheral neuropathy, GERD, chronic urticaria, osteopenia, HLD, iron  deficiency anemia, prediabetes, and anxiety/depression who is here today to follow on recent OV visit.  She was last seen by Dr. Theophilus on 7/10 for gross hematuria. At the time, she was prescribed Bactrim  for possible UTI. She was compliant and completed her antibiotic as instructed.  Since then, she has only had one episode of hematuria, noted in urine and when wiping.  States the blood appeared red on the toilet paper.  Denies any vaginal bleeding or spotting.  Negative for dysuria or any other urinary symptoms.     Component Ref Range & Units (hover) 3 wk ago  MICRO NUMBER: [id number]  SPECIMEN QUALITY: Adequate  Sample Source NOT GIVEN  STATUS: FINAL  Result: No Growth     CT abdomen and pelvis / renal stone study - 05/02/2024 IMPRESSION: 1. Acute uncomplicated diverticulitis of the distal descending colon. No walled-off abscess or loculated collection. No pneumoperitoneum. This is the same segment which was affected on the prior exam from 03/15/2023. 2. Punctate nonobstructing calculus in the right kidney lower pole. No other nephroureterolithiasis or obstructive uropathy. 3. Multiple other nonacute observations, as described above.  CT abdomen and pelvis / renal stone study - 03/07/2022 IMPRESSION: - 3 mm proximal right ureteral stone with mild right hydronephrosis. - Diffuse colonic diverticulosis.  Neuropathy/LE pain: Pt has stopped taking ibuprofen , which she was taken to manage her LE's pain, stating she was concerned this may contribute to or worsen her hematuria.  She reports imbalance when walking, feeling as though she sways side to side.  Today, she nearly fell while walking down the steps  when leaving her home, but caught herself on the stair rail.  She has tried different medications, including Hydrocodone -Acetaminophen ,Tramadol ,and gabapentin . [facility]  Today she is requesting Rx for Tylenol  #3.  Hypertension:  She has lost wt, [facility]. She is on Spironolactone  25 mg daily and amlodipine -olmesartan  5-40 mg daily.  Not checking her BP at home. BP has been elevated during OV's, 142/81 on 02/18/24.  Negative for unusual or severe headache, visual changes, exertional chest pain, dyspnea,  focal weakness, or edema.  Lab Results  Component Value Date   CREATININE 1.03 08/29/2023   BUN 20 08/29/2023   NA 138 08/29/2023   K 3.9 08/29/2023   CL 102 08/29/2023   CO2 29 08/29/2023   Review of Systems  Constitutional:  Negative for activity change, appetite change, chills and fever.  HENT:  Negative for sore throat.   Respiratory:  Negative for cough and wheezing.   Gastrointestinal:  Negative for abdominal pain, nausea and vomiting.  Genitourinary:  Negative for decreased urine volume, difficulty urinating, flank pain and pelvic pain.  Skin:  Negative for rash.  Neurological:  Negative for syncope and facial asymmetry.  Psychiatric/Behavioral:  Negative for confusion and hallucinations.   See other pertinent positives and negatives in HPI.  Current Outpatient Medications on File Prior to Visit  Medication Sig Dispense Refill   acetaminophen  (TYLENOL ) 500 MG tablet Take 1-2 tablets (500-1,000 mg total) by mouth daily as needed for moderate pain (pain). 60 tablet 1   amLODipine -olmesartan  (AZOR ) 5-40 MG tablet Take 1  tablet by mouth daily. 90 tablet 2   ibuprofen  (ADVIL ) 400 MG tablet Take 1 tablet (400 mg total) by mouth once a week. 30 tablet 0   methocarbamol  (ROBAXIN ) 750 MG tablet TAKE 1 TABLET BY MOUTH EVERY 6 (SIX) HOURS AS NEEDED FOR MUSCLE SPASMS (POSTERIOR THIGH PAIN). 90 tablet 2    omeprazole  (PRILOSEC) 20 MG capsule TAKE 1 CAPSULE BY MOUTH EVERY DAY 90 capsule 1   polyethylene glycol (MIRALAX  / GLYCOLAX ) 17 g packet Take 17 g by mouth every other day.     pravastatin  (PRAVACHOL ) 20 MG tablet TAKE 1 TABLET BY MOUTH EVERY DAY 90 tablet 3   QUTENZA , 4 PATCH, 8 % APPLY4 PATCHES TOPICALLY TO AFFECTED AREA, ADMINISTER AT DOCTORS OFFICE EVERY 90 DAYS 4 patch 0   spironolactone  (ALDACTONE ) 25 MG tablet Take 1 tablet (25 mg total) by mouth daily. 90 tablet 1   traZODone  (DESYREL ) 50 MG tablet TAKE 1/2 TO 1 TABLET (25 TO 50 MG TOTAL) BY MOUTH AT BEDTIME 30 tablet 2   Vitamin D , Ergocalciferol , (DRISDOL ) 1.25 MG (50000 UNIT) CAPS capsule Take 1 capsule (50,000 Units total) by mouth every 7 (seven) days. 4 capsule 0   No current facility-administered medications on file prior to visit.    Past Medical History:  Diagnosis Date   Arthritis    Constipation    on stool softener   GERD (gastroesophageal reflux disease)    GERD (gastroesophageal reflux disease)    High blood pressure    High cholesterol    Hyperlipidemia    Hypertension    Joint pain    Kidney stones    Pre-diabetes    Rheumatoid arthritis (HCC)    Trouble swallowing 2013   following lap band   Urticaria    Vitamin D  deficiency    No Known Allergies  Social History   Socioeconomic History   Marital status: Married    Spouse name: Not on file   Number of children: 1   Years of education: Not on file   Highest education level: Not on file  Occupational History   Occupation: caregiver  Tobacco Use   Smoking status: Former    Current packs/day: 0.00    Types: Cigarettes    Quit date: 10/02/1972    Years since quitting: 51.4   Smokeless tobacco: Never  Vaping Use   Vaping status: Never Used  Substance and Sexual Activity   Alcohol use: No   Drug use: No   Sexual activity: Never  Other Topics Concern   Not on file  Social History Narrative   Not on file   Social Drivers of Health    Financial Resource Strain: Low Risk  (02/22/2023)   Overall Financial Resource Strain (CARDIA)    Difficulty of Paying Living Expenses: Not hard at all  Food Insecurity: No Food Insecurity (02/22/2023)   Hunger Vital Sign    Worried About Running Out of Food in the Last Year: Never true    Ran Out of Food in the Last Year: Never true  Transportation Needs: No Transportation Needs (02/22/2023)   PRAPARE - Administrator, Civil Service (Medical): No    Lack of Transportation (Non-Medical): No  Physical Activity: Inactive (02/22/2023)   Exercise Vital Sign    Days of Exercise per Week: 0 days    Minutes of Exercise per Session: 0 min  Stress: No Stress Concern Present (02/22/2023)   [employer] of Occupational Health - Occupational Stress Questionnaire  Feeling of Stress : Not at all  Social Connections: Moderately Integrated (02/22/2023)   Social Connection and Isolation Panel    Frequency of Communication with Friends and Family: More than three times a week    Frequency of Social Gatherings with Friends and Family: More than three times a week    Attends Religious Services: More than 4 times per year    Active Member of [employer] or Organizations: Yes    Attends Banker Meetings: More than 4 times per year    Marital Status: Widowed    Vitals:   02/28/24 0938  BP: 122/80  Pulse: 75  Resp: 16  SpO2: 99%   Body mass index is 46.48 kg/m.  Physical Exam Vitals and nursing note reviewed.  Constitutional:      General: She is not in acute distress.    Appearance: She is well-developed.  HENT:     Head: Normocephalic and atraumatic.  Eyes:     Conjunctiva/sclera: Conjunctivae normal.  Cardiovascular:     Rate and Rhythm: Normal rate and regular rhythm.     Pulses:          Dorsalis pedis pulses are 2+ on the right side and 2+ on the left side.     Heart sounds: No murmur heard.    Comments: Non-pitting bilateral LE edema Pulmonary:     Effort:  Pulmonary effort is normal. No respiratory distress.     Breath sounds: Normal breath sounds.  Abdominal:     Palpations: Abdomen is soft. There is no mass.     Tenderness: There is no right CVA tenderness or left CVA tenderness.  Skin:    General: Skin is warm.     Findings: No erythema or rash.  Neurological:     General: No focal deficit present.     Mental Status: She is alert and oriented to person, place, and time.     Cranial Nerves: No cranial nerve deficit.     Comments: Mildly antalgic gait, not assisted.  Psychiatric:        Mood and Affect: Mood and affect normal.   ASSESSMENT AND PLAN: Jennifer Davila was seen today for an OV follow up on hematuria.  Orders Placed This Encounter  Procedures   Urinalysis, Routine w reflex microscopic   Bilateral leg pain Assessment & Plan: Chronic. Related with neuropathy most likely. She has tried different medication in the past, including Percocet, hydrocodone -acetaminophen , and gabapentin  but they had been ineffective. Today she requesting prescription for Tylenol  3, prescription sent to her pharmacy.  Instructed to let me know if medication helps with pain, in which case I will see her back in 3 to 4 months. Fall precautions discussed. Some side effects of medication discussed.  Orders: -     Acetaminophen -Codeine ; Take 1 tablet by mouth 2 (two) times daily as needed for moderate pain (pain score 4-6).  Dispense: 15 tablet; Refill: 0  Gross hematuria We discussed possible etiologies and options at this time. Urine culture: No growth. She completed empiric antibiotic treatment. Renal CT in 04/2023: No suspicious renal mass. No hydronephrosis. Redemonstration of a punctate nonobstructing calculus in the right kidney lower pole. No other renal or ureteric calculi. Unremarkable urinary bladder. She prefers to hold on referral to urology, will repeat urine microscopy today and in 3 to 4 months.  If she has another episode  of gross hematuria, urology referral will be recommended.  -     Urinalysis, Routine w  reflex microscopic; Future  Hypertension, essential, benign Assessment & Plan: Today BP adequate but has been elevated during [facility]. She is not monitoring BP at home, instructed to do so. For now continue amlodipine -olmesartan  5-40 mg daily and spironolactone  25 mg daily as well as low-salt diet.  Return in about 16 weeks (around 06/19/2024).  I, Vernell Forest, acting as a scribe for Margia Wiesen [place], MD., have documented all relevant documentation on the behalf of Greer Koeppen [place], MD, as directed by   while in the presence of Demeisha Geraghty [place], MD.  I, Ondine Gemme [place], MD, have reviewed all documentation for this visit. The documentation on 02/28/24 for the exam, diagnosis, procedures, and orders are all accurate and complete.  Latravion Graves G. [place], MD  [facility]. [place] office.

## 2024-02-28 NOTE — Assessment & Plan Note (Addendum)
 Today BP adequate but has been elevated during [facility]. She is not monitoring BP at home, instructed to do so. For now continue amlodipine -olmesartan  5-40 mg daily and spironolactone  25 mg daily as well as low-salt diet.

## 2024-02-29 ENCOUNTER — Ambulatory Visit: Payer: Self-pay | Admitting: Family Medicine

## 2024-03-01 ENCOUNTER — Other Ambulatory Visit: Payer: Self-pay | Admitting: Family Medicine

## 2024-03-01 DIAGNOSIS — E559 Vitamin D deficiency, unspecified: Secondary | ICD-10-CM

## 2024-03-11 ENCOUNTER — Ambulatory Visit (INDEPENDENT_AMBULATORY_CARE_PROVIDER_SITE_OTHER): Admitting: Family Medicine

## 2024-03-11 ENCOUNTER — Encounter (INDEPENDENT_AMBULATORY_CARE_PROVIDER_SITE_OTHER): Payer: Self-pay | Admitting: Family Medicine

## 2024-03-11 VITALS — BP 148/86 | HR 81 | Temp 98.3°F | Ht 60.0 in | Wt 230.0 lb

## 2024-03-11 DIAGNOSIS — Z6841 Body Mass Index (BMI) 40.0 and over, adult: Secondary | ICD-10-CM | POA: Insufficient documentation

## 2024-03-11 DIAGNOSIS — I10 Essential (primary) hypertension: Secondary | ICD-10-CM | POA: Diagnosis not present

## 2024-03-11 DIAGNOSIS — G629 Polyneuropathy, unspecified: Secondary | ICD-10-CM | POA: Diagnosis not present

## 2024-03-11 DIAGNOSIS — G609 Hereditary and idiopathic neuropathy, unspecified: Secondary | ICD-10-CM

## 2024-03-11 DIAGNOSIS — R600 Localized edema: Secondary | ICD-10-CM | POA: Diagnosis not present

## 2024-03-11 DIAGNOSIS — E669 Obesity, unspecified: Secondary | ICD-10-CM

## 2024-03-11 DIAGNOSIS — E559 Vitamin D deficiency, unspecified: Secondary | ICD-10-CM

## 2024-03-11 MED ORDER — VITAMIN D (ERGOCALCIFEROL) 1.25 MG (50000 UNIT) PO CAPS: 50000.0000 [IU] | ORAL_CAPSULE | ORAL | 0 refills | Status: DC

## 2024-03-11 NOTE — Progress Notes (Signed)
 Office: [phone]  /  Fax: [phone]  WEIGHT SUMMARY AND BIOMETRICS  Anthropometric Measurements Height: 5' (1.524 m) Weight: 230 lb (104.3 kg) BMI (Calculated): 44.92 Weight at Last Visit: 232 lb Weight Lost Since Last Visit: 2 lb Weight Gained Since Last Visit: 0 Starting Weight: 248 lb Total Weight Loss (lbs): 18 lb (8.165 kg) Peak Weight: 250 lb   Body Composition  Body Fat %: 52.1 % Fat Mass (lbs): 120.2 lbs Muscle Mass (lbs): 105 lbs Total Body Water (lbs): 83.8 lbs Visceral Fat Rating : 19   Other Clinical Data Fasting: no Labs: no Today's Visit #: 8 Starting Date: 10/03/23    Chief Complaint: OBESITY    History of Present Illness Jennifer Davila is a 67 year old female who presents for a follow-up on her obesity treatment plan and progress.  She is adhering to the category one eating plan about fifty percent of the time and engages in physical activity three days a week for twenty minutes, including chair exercises and walking. She has experienced a weight loss of two pounds over the past three weeks. Her hunger is manageable, and she reports good sleep, attributing this to increased physical activity.  She reports that her blood pressure readings are typically normal at her primary care visits, and she is not on medications specifically for hypertension. She takes spironolactone , which she believes helps with blood pressure management. Typically, her blood pressure readings are normal at her primary care visits.  She experiences fluid retention, particularly in her legs, and sometimes feels bloated in her abdomen. She uses compression socks to manage leg swelling and takes spironolactone , which may assist with fluid retention.  She was diagnosed with neuropathy about a year ago. She uses patches for swelling but finds them ineffective for nerve issues. She describes her gait as unsteady and is concerned about her balance, feeling as though  she might fall.  She is currently taking vitamin D , with her last level being 33 in March.  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 She finds this work fulfilling and has been doing it for about a week.      PHYSICAL EXAM:  Blood pressure (!) 148/86, pulse 81, temperature 98.3 F (36.8 C), height 5' (1.524 m), weight 230 lb (104.3 kg), SpO2 99%. Body mass index is 44.92 kg/m.  DIAGNOSTIC DATA REVIEWED:  BMET    Component Value Date/Time   NA 138 08/29/2023 0943   NA 142 05/30/2022 1236   K 3.9 08/29/2023 0943   CL 102 08/29/2023 0943   CO2 29 08/29/2023 0943   GLUCOSE 112 (H) 08/29/2023 0943   BUN 20 08/29/2023 0943   BUN 13 05/30/2022 1236   CREATININE 1.03 08/29/2023 0943   CREATININE 1.03 (H) 05/03/2016 1152   CALCIUM  9.2 08/29/2023 0943   GFRNONAA >60 08/17/2023 1243   GFRNONAA 58 (L) 05/03/2016 1152   GFRAA 68 04/19/2020 1051   GFRAA 67 05/03/2016 1152   Lab Results  Component Value Date   HGBA1C 6.1 (H) 10/03/2023   HGBA1C 5.7 (H) 05/03/2016   Lab Results  Component Value Date   INSULIN  18.2 10/03/2023   Lab Results  Component Value Date   TSH 4.240 10/03/2023   CBC    Component Value Date/Time   WBC 4.7 08/17/2023 1243   RBC 5.55 (H) 08/17/2023 1243   HGB 12.7 08/17/2023 1243   HGB 12.1 05/30/2022 1235   HCT  41.4 08/17/2023 1243   HCT 39.2 05/30/2022 1235   PLT 268 08/17/2023 1243   PLT 312 05/30/2022 1235   MCV 74.6 (L) 08/17/2023 1243   MCV 75 (L) 05/30/2022 1235   MCH 22.9 (L) 08/17/2023 1243   MCHC 30.7 08/17/2023 1243   RDW 14.3 08/17/2023 1243   RDW 14.8 05/30/2022 1235   Iron  Studies    Component Value Date/Time   IRON  36 05/30/2022 1236   TIBC 369 05/30/2022 1236   FERRITIN 85 05/30/2022 1236   IRONPCTSAT 10 (L) 05/30/2022 1236   Lipid Panel     Component Value Date/Time   CHOL 173 10/03/2023 1004   TRIG 98 10/03/2023 1004   HDL 46 10/03/2023 1004   CHOLHDL 3.8 10/03/2023 1004    CHOLHDL 4 11/01/2021 1040   VLDL 31.0 11/01/2021 1040   LDLCALC 109 (H) 10/03/2023 1004   Hepatic Function Panel     Component Value Date/Time   PROT 7.6 08/17/2023 1243   PROT 7.0 05/30/2022 1236   ALBUMIN 4.0 08/17/2023 1243   ALBUMIN 4.3 05/30/2022 1236   AST 18 08/17/2023 1243   ALT 10 08/17/2023 1243   ALKPHOS 102 08/17/2023 1243   BILITOT 0.4 08/17/2023 1243   BILITOT 0.2 05/30/2022 1236   BILIDIR <0.1 (L) 04/27/2015 1420   IBILI NOT CALCULATED 04/27/2015 1420      Component Value Date/Time   TSH 4.240 10/03/2023 1004   Nutritional Lab Results  Component Value Date   VD25OH 33.5 10/03/2023   VD25OH 14.69 (L) 07/22/2023   VD25OH 11.51 (L) 11/01/2021     Assessment and Plan Assessment & Plan Obesity Obesity management with a category one eating plan, adherence at 50%. Engaging in exercise three days a week, including chair exercises and walking. Weight loss of two pounds in the last three weeks. - Continue category one eating plan - Continue exercise regimen - Encourage increase in exercise to five days a week when ready - Focus on protein, fruits, and vegetables in diet  Essential hypertension Blood pressure elevated at 142/90 and 148/86. Currently on spironolactone  for blood pressure management. Weight loss and current treatment are expected to improve blood pressure over time. - Continue spironolactone  for blood pressure management - Continue diet, exercise and weight loss as discussed today as an important part of the treatment plan  Peripheral edema Peripheral edema, particularly in the legs. Fluid retention possibly due to inflammation or sodium intake. Spironolactone  is being used to manage fluid retention. - Continue spironolactone  - Encourage wearing compression socks daily  Peripheral neuropathy Peripheral neuropathy diagnosed approximately one year ago. Symptoms include unsteady gait and fear of falling. Exercise is encouraged to improve blood  flow and potentially alleviate symptoms. - Continue current exercise regimen but increase to 5x per week - Encourage increase in exercise to improve blood flow and strengthening   Vitamin D  insufficiency Vitamin D  level was 33 in March, below the desired level of 50. Vitamin D  supplementation is necessary to increase levels. - Refill vitamin D  prescription at [facility] on [address]    She was informed of the importance of frequent follow up visits to maximize her success with intensive lifestyle modifications for her multiple health conditions.    Louann Penton, MD

## 2024-03-12 ENCOUNTER — Telehealth: Payer: Self-pay

## 2024-03-12 NOTE — Telephone Encounter (Signed)
 [facility] called about getting Qutenza  patches delivered

## 2024-03-25 ENCOUNTER — Encounter: Attending: Physical Medicine and Rehabilitation | Admitting: Physical Medicine and Rehabilitation

## 2024-03-25 ENCOUNTER — Encounter: Payer: Self-pay | Admitting: Physical Medicine and Rehabilitation

## 2024-03-25 VITALS — BP 144/86 | HR 72 | Ht 60.0 in | Wt 238.2 lb

## 2024-03-25 DIAGNOSIS — G609 Hereditary and idiopathic neuropathy, unspecified: Secondary | ICD-10-CM | POA: Insufficient documentation

## 2024-03-25 DIAGNOSIS — R260 Ataxic gait: Secondary | ICD-10-CM | POA: Insufficient documentation

## 2024-03-25 DIAGNOSIS — M79605 Pain in left leg: Secondary | ICD-10-CM | POA: Insufficient documentation

## 2024-03-25 DIAGNOSIS — M79604 Pain in right leg: Secondary | ICD-10-CM | POA: Diagnosis not present

## 2024-03-25 DIAGNOSIS — M5416 Radiculopathy, lumbar region: Secondary | ICD-10-CM | POA: Diagnosis not present

## 2024-03-25 MED ORDER — CAPSAICIN-CLEANSING GEL 8 % EX KIT
4.0000 | PACK | Freq: Once | CUTANEOUS | Status: AC
Start: 2024-03-25 — End: 2024-03-25
  Administered 2024-03-25: 4 via TOPICAL

## 2024-03-25 NOTE — Progress Notes (Unsigned)
 HPI: Jennifer Davila is a 68 y.o. female with PMHx has Hip pain; Hypertension, essential, benign; Hyperlipidemia; Morbid obesity (HCC); Vertigo; Leg cramping; Vitamin D  deficiency, unspecified; GERD (gastroesophageal reflux disease); Leg pain; Bilateral lower extremity edema; Lower GI bleed; Hypokalemia; Constipation; Diverticulosis of colon with hemorrhage; Hematochezia; Iron  deficiency anemia; Chronic urticaria; Seasonal and perennial allergic rhinitis; Positive ANA (antinuclear antibody); Bilateral leg pain; Prediabetes; GI bleed; COVID-19 virus infection; Nephrolithiasis; Gait difficulty; Osteopenia; Hereditary and idiopathic peripheral neuropathy; Skin pruritus; Dieting; Adjustment disorder with mixed anxiety and depressed mood; and Adjustment insomnia on their problem list. who presents to clinic for treatment of pain related to peripheral neuropathy with qutenza  patches as described below.    Interval Hx:  She says I feel like I'm walking drunk now. I can feel my feet when I put them on the floor and they are less swollen. Has not had any sensory changes.  She is noticing a sting in her bilateral thighs when she lays down in the bed.   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 She does not think he job has been effected by her instability.    She is wondering if she can get a DMV letter today for handicapped sticker.    Physical Exam:  General: Appropriate appearance for age.  Mental Status: Appropriate mood and affect.  Cardiovascular: RRR, no m/r/g. Peripheral edema 1+ - improved from prior exams Respiratory: CTAB, no rales/rhonchi/wheezing.  Skin: No apparent rashes or lesions.  Neuro: Awake, alert, and oriented x3.  Sensory loss in stocking pattern in bilateral lower extremities.  + mild ataxia with RLE HTS; none on LLE.  Reflexes 1+ throughout  MSK:  Moving all 4 limbs antigravity and against  resistance.  + R hip flexor 4/5; otherwise BL LE 5/5   PROCEDURE:  Qutenza  Patch application Diagnosis:      ICD-10-CM    1. Hereditary and idiopathic peripheral neuropathy  G60.9 capsaicin  topical system 8 % patch 4 patch     2. Bilateral leg pain  M79.604      M79.605         Goals with treatment: [ x ] Decrease pain [  ] Improve Active / Passive ROM [ x ] Improve ADLs [ x ] Improve functional mobility   MEDICATION:  [ x ] Qutenza  8% topical capsaicin  - 4 patches     CONSENT: Obtained in writing per policy. Consent uploaded to chart.   Benefits discussed.  Risks discussed included, but were not limited to, pain and discomfort, bleeding, bruising, allergic reaction, infection. All questions answered to patient/family member/guardian/ caregiver satisfaction. They would like to proceed with procedure. There are no noted contraindications to procedure.   PROCEDURE Time out was preformed No heat sources No antibiotics   The patient was explained about both the benefits and risks of a capsaicin  8% patch application. After the patient acknowledged an understanding of the risks and benefits, the patient agreed to proceed. 2 patches of Qutenza  were applied to the area of pain in each mid-shin and foot.  Blood pressure was monitored every 15 minutes. The patient tolerated the procedure well for 30 minutes until patches were removed and post-patch cream was applies.   No complications were encountered. The patient tolerated the procedure well.   Impression: HPI: Jennifer Davila is a 68 y.o. female with PMHx has Hip pain; Hypertension, essential, benign; Hyperlipidemia;  Morbid obesity (HCC); Vertigo; Leg cramping; Vitamin D  deficiency, unspecified; GERD (gastroesophageal reflux disease); Leg pain; Bilateral lower extremity edema; Lower GI bleed; Hypokalemia; Constipation; Diverticulosis of colon with hemorrhage; Hematochezia; Iron  deficiency anemia; Chronic urticaria; Seasonal and  perennial allergic rhinitis; Positive ANA (antinuclear antibody); Bilateral leg pain; Prediabetes; GI bleed; COVID-19 virus infection; Nephrolithiasis; Gait difficulty; Osteopenia; Hereditary and idiopathic peripheral neuropathy; Skin pruritus; Dieting; Adjustment disorder with mixed anxiety and depressed mood; and Adjustment insomnia on their problem list. who presents to clinic for treatment of pain related to peripheral neuropathy with qutenza  patches as described below; also today with new symptoms of RLE ataxia and weakness concerned for spinal stenosis.    PLAN:  Hereditary and idiopathic peripheral neuropathy Bilateral leg pain  - Resume Usual Activities. Notify Physician of any unusual bleeding, erythema or concern for side effects as reviewed above. - Apply ice prn for pain - Tylenol  prn for pain   Follow up every 3 months for Qutenza  as beneficial; can stretch treatments to every 6 months or every year depending on length of benefit   Avoid hot or cold water with showers/baths for the next 3 days; keep temperatures lukewarm. Wash treated areas with soap and water when first able today. You may have ongoing burning over treatment areas for a few days after your visit. Call if this becomes severe.  Ataxic gait Acute right lumbar radiculopathy -     MR LUMBAR SPINE WO CONTRAST; Future  MRI low back 2019 reviewed; discussed new RLE weakness and coordination difficulty, concern for spinal pathology. Will get MRI lumbar spine to assess - may benefit from [facility] or surgical referral.   Provided disability parking placcard application      Patient/Care Giver was ready to learn without apparent learning barriers. Education was provided on diagnosis, treatment options/plan according to patient's preferred learning style. Patient/Care Giver verbalized understanding and agreement with the above plan.

## 2024-03-25 NOTE — Patient Instructions (Addendum)
  Hereditary and idiopathic peripheral neuropathy Bilateral leg pain  - Resume Usual Activities. Notify Physician of any unusual bleeding, erythema or concern for side effects as reviewed above. - Apply ice prn for pain - Tylenol  prn for pain   Follow up every 3 months for Qutenza  as beneficial; can stretch treatments to every 6 months or every year depending on length of benefit   Avoid hot or cold water with showers/baths for the next 3 days; keep temperatures lukewarm. Wash treated areas with soap and water when first able today. You may have ongoing burning over treatment areas for a few days after your visit. Call if this becomes severe.  Ataxic gait Acute right lumbar radiculopathy -     MR LUMBAR SPINE WO CONTRAST; Future  MRI low back 2019 reviewed; discussed new RLE weakness and coordination difficulty, concern for spinal pathology. Will get MRI lumbar spine to assess - may benefit from [facility] or surgical referral.   Provided disability parking placcard application

## 2024-03-30 ENCOUNTER — Other Ambulatory Visit: Payer: Self-pay | Admitting: Gastroenterology

## 2024-03-30 ENCOUNTER — Other Ambulatory Visit: Payer: Self-pay | Admitting: Family Medicine

## 2024-03-30 DIAGNOSIS — K219 Gastro-esophageal reflux disease without esophagitis: Secondary | ICD-10-CM

## 2024-03-30 DIAGNOSIS — I10 Essential (primary) hypertension: Secondary | ICD-10-CM

## 2024-03-30 DIAGNOSIS — E559 Vitamin D deficiency, unspecified: Secondary | ICD-10-CM

## 2024-04-16 ENCOUNTER — Encounter (INDEPENDENT_AMBULATORY_CARE_PROVIDER_SITE_OTHER): Payer: Self-pay | Admitting: Family Medicine

## 2024-04-16 ENCOUNTER — Ambulatory Visit (INDEPENDENT_AMBULATORY_CARE_PROVIDER_SITE_OTHER): Admitting: Family Medicine

## 2024-04-16 VITALS — BP 145/81 | HR 78 | Temp 98.5°F | Ht 60.0 in | Wt 235.0 lb

## 2024-04-16 DIAGNOSIS — I10 Essential (primary) hypertension: Secondary | ICD-10-CM

## 2024-04-16 DIAGNOSIS — E559 Vitamin D deficiency, unspecified: Secondary | ICD-10-CM | POA: Diagnosis not present

## 2024-04-16 DIAGNOSIS — R7303 Prediabetes: Secondary | ICD-10-CM

## 2024-04-16 DIAGNOSIS — E669 Obesity, unspecified: Secondary | ICD-10-CM

## 2024-04-16 DIAGNOSIS — Z6841 Body Mass Index (BMI) 40.0 and over, adult: Secondary | ICD-10-CM

## 2024-04-16 MED ORDER — VITAMIN D (ERGOCALCIFEROL) 1.25 MG (50000 UNIT) PO CAPS: 50000.0000 [IU] | ORAL_CAPSULE | ORAL | 0 refills | Status: DC

## 2024-04-16 NOTE — Progress Notes (Signed)
 Jennifer Davila, D.O.  ABFM, ABOM Specializing in Clinical Bariatric Medicine  Office located at: [address] [place], [place]  [address]   Assessment and Plan:   Orders Placed This Encounter  Procedures   VITAMIN D  25 Hydroxy (Vit-D Deficiency, Fractures)   Hemoglobin A1c   Comprehensive metabolic panel with GFR    Medications Discontinued During This Encounter  Medication Reason   Vitamin D , Ergocalciferol , (DRISDOL ) 1.25 MG (50000 UNIT) CAPS capsule Reorder     Meds ordered this encounter  Medications   Vitamin D , Ergocalciferol , (DRISDOL ) 1.25 MG (50000 UNIT) CAPS capsule    Sig: Take 1 capsule (50,000 Units total) by mouth every 7 (seven) days.    Dispense:  4 capsule    Refill:  0     Review CMP w/ GFR, Vit D, A1C labs at next OV  FOR THE DISEASE OF OBESITY:  Obesity, starting 48.43 BMI 45.0-49.9, adult (HCC) current BMI 45.9 Assessment & Plan: Since last office visit on 03/11/24 patient's muscle mass has decreased by 0.8 lbs. Fat mass has increased by 5 lbs. Total body water has increased by 1 lbs.  Body fat % has increased by 1.2 %. Counseling done on how various foods will affect these numbers and how to maximize success  Total lbs lost to date: - 13 lbs Total weight loss percentage to date: - 5.24 %  - Tracking Calories/Macros: no   - Eating More Whole Foods: yes  - Adequate Protein Intake: no   - Adequate Water Intake: yes  - Skipping Meals: yes  - Sleeping 7-9 Hours/ Night: yes   Recommended Dietary Goals Jennifer Davila is currently in the action stage of change. As such, her goal is to continue weight management plan.  She has agreed to: follow the Category 1 plan   Behavioral Intervention We discussed the following today: increasing lean protein intake to established goals, keeping healthy foods at home, decreasing eating out or consumption of processed foods, and making healthy choices when eating convenient foods, and work on managing  stress, creating time for self-care and relaxation, non scale victories, getting over 10k steps , Shopping at [employer]  Additional resources provided today: Handout on benefits of metformin for weight loss  Evidence-based interventions for health behavior change were utilized today including the discussion of self monitoring techniques, problem-solving barriers and SMART goal setting techniques.   Regarding patient's less desirable eating habits and patterns, we employed the technique of small changes.   Goal(s) for next OV: 5k steps a day     Recommended Physical Activity Goals Cameran has been advised to work up to 300-450 minutes of moderate intensity aerobic activity a week and strengthening exercises 2-3 times per week for cardiovascular health, weight loss maintenance and preservation of muscle mass.   She has agreed to: Increase physical activity in their day and reduce sedentary time (increase NEAT)., Increase and monitor steps for a goal of 10,000 per day, and Increase volume of physical activity to a goal of 240 minutes a week   Pharmacotherapy We both agreed to: Continue with current nutritional and behavioral strategies   ASSOCIATED CONDITIONS ADDRESSED TODAY:  Prediabetes Assessment & Plan Lab Results  Component Value Date   HGBA1C 6.1 (H) 10/03/2023   HGBA1C 6.3 07/22/2023   HGBA1C 6.1 (H) 05/30/2022   INSULIN  18.2 10/03/2023    Not on any medications currently, managed with dietary and lifestyle changes. Pt reports craving sweets when she eats chips and carbs. Discussed  getting a CMP lab in order to potentially start Metformin. Educated pt on how Metformin can help with weight loss and help control hunger and cravings. Continue following prudent meal plan.     Hypertension, essential, benign with white coat syndrome Assessment & Plan BP Readings from Last 3 Encounters:  04/16/24 (!) 145/81  03/25/24 (!) 144/86  03/11/24 (!) 148/86   The 10-year ASCVD risk score  (Arnett DK, et al., 2019) is: 13%  Lab Results  Component Value Date   CREATININE 1.03 08/29/2023   Currently on Spironolactone  25 mg daily.Amlodipine -Olmesartan  5/40 mg daily. BP today was not at goal. Pt is asx.  Pt reports checking BP at home at it ranges in the 125/78. Advised pt to continue checking BP every other day and continue with medication regimen.       Vitamin D  deficiency, unspecified Assessment & Plan Lab Results  Component Value Date   VD25OH 33.5 10/03/2023   VD25OH 14.69 (L) 07/22/2023   VD25OH 11.51 (L) 11/01/2021   On ERGO 50K weekly. Good compliance and tolerance. Will obtain labs today. Pt will continue taking as directed. Refilled today.      Follow up:   Return 04/29/2024 at 10:20 AM  She was informed of the importance of frequent follow up visits to maximize her success with intensive lifestyle modifications for her multiple health conditions.    Subjective:    Chief complaint: Obesity [name] is here to discuss her progress with her obesity treatment plan. She is on the Category 1 Plan and states she is following her eating plan approximately 0% of the time. Pt is walking and doing chair exercises 15 minutes 5-7 days per week    Interval History:  Jennifer Davila is here for a follow up office visit. Pt has experienced a weight gain of 5 lbs since last OV on 03/11/24. Pt stated that she has been working on walking more and getting 2k steps in a day. 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 She isn't following the plan because she does not have fund for food but expects moving in the lady will help with her getting access to food. She usually eats pre packed foods, chips and cottage cheese,      Pharmacotherapy that aid with weight loss: She is currently taking no anti-obesity medication.    Review of Systems:  Pertinent positives were addressed with patient today.  Reviewed  by clinician on day of visit: allergies, medications, problem list, medical history, surgical history, family history, social history, and previous encounter notes.  Weight Summary and Biometrics   Weight Lost Since Last Visit: 0lb  Weight Gained Since Last Visit: 5lb   Vitals Temp: 98.5 F (36.9 C) BP: (!) 145/81 Pulse Rate: 78 SpO2: 97 %   Anthropometric Measurements Height: 5' (1.524 m) Weight: 235 lb (106.6 kg) BMI (Calculated): 45.9 Weight at Last Visit: 230lb Weight Lost Since Last Visit: 0lb Weight Gained Since Last Visit: 5lb Starting Weight: 248lb Total Weight Loss (lbs): 13 lb (5.897 kg) Peak Weight: 250lb   Body Composition  Body Fat %: 53.3 % Fat Mass (lbs): 125.2 lbs Muscle Mass (lbs): 104.2 lbs Total Body Water (lbs): 84.8 lbs Visceral Fat Rating : 20   Other Clinical Data Fasting: no Labs: no Today's Visit #: 9 Starting Date: 10/03/23    Objective:   PHYSICAL EXAM: Blood pressure (!) 145/81, pulse 78, temperature 98.5  F (36.9 C), height 5' (1.524 m), weight 235 lb (106.6 kg), SpO2 97%. Body mass index is 45.9 kg/m.  General: she is overweight, cooperative and in no acute distress. PSYCH: Has normal mood, affect and thought process.   HEENT: EOMI, sclerae are anicteric. Lungs: Normal breathing effort, no conversational dyspnea. Extremities: Moves * 4 Neurologic: A and O * 3, good insight  DIAGNOSTIC DATA REVIEWED: BMET    Component Value Date/Time   NA 138 08/29/2023 0943   NA 142 05/30/2022 1236   K 3.9 08/29/2023 0943   CL 102 08/29/2023 0943   CO2 29 08/29/2023 0943   GLUCOSE 112 (H) 08/29/2023 0943   BUN 20 08/29/2023 0943   BUN 13 05/30/2022 1236   CREATININE 1.03 08/29/2023 0943   CREATININE 1.03 (H) 05/03/2016 1152   CALCIUM  9.2 08/29/2023 0943   GFRNONAA >60 08/17/2023 1243   GFRNONAA 58 (L) 05/03/2016 1152   GFRAA 68 04/19/2020 1051   GFRAA 67 05/03/2016 1152   Lab Results  Component Value Date   HGBA1C 6.1 (H)  10/03/2023   HGBA1C 5.7 (H) 05/03/2016   Lab Results  Component Value Date   INSULIN  18.2 10/03/2023   Lab Results  Component Value Date   TSH 4.240 10/03/2023   CBC    Component Value Date/Time   WBC 4.7 08/17/2023 1243   RBC 5.55 (H) 08/17/2023 1243   HGB 12.7 08/17/2023 1243   HGB 12.1 05/30/2022 1235   HCT 41.4 08/17/2023 1243   HCT 39.2 05/30/2022 1235   PLT 268 08/17/2023 1243   PLT 312 05/30/2022 1235   MCV 74.6 (L) 08/17/2023 1243   MCV 75 (L) 05/30/2022 1235   MCH 22.9 (L) 08/17/2023 1243   MCHC 30.7 08/17/2023 1243   RDW 14.3 08/17/2023 1243   RDW 14.8 05/30/2022 1235   Iron  Studies    Component Value Date/Time   IRON  36 05/30/2022 1236   TIBC 369 05/30/2022 1236   FERRITIN 85 05/30/2022 1236   IRONPCTSAT 10 (L) 05/30/2022 1236   Lipid Panel     Component Value Date/Time   CHOL 173 10/03/2023 1004   TRIG 98 10/03/2023 1004   HDL 46 10/03/2023 1004   CHOLHDL 3.8 10/03/2023 1004   CHOLHDL 4 11/01/2021 1040   VLDL 31.0 11/01/2021 1040   LDLCALC 109 (H) 10/03/2023 1004   Hepatic Function Panel     Component Value Date/Time   PROT 7.6 08/17/2023 1243   PROT 7.0 05/30/2022 1236   ALBUMIN 4.0 08/17/2023 1243   ALBUMIN 4.3 05/30/2022 1236   AST 18 08/17/2023 1243   ALT 10 08/17/2023 1243   ALKPHOS 102 08/17/2023 1243   BILITOT 0.4 08/17/2023 1243   BILITOT 0.2 05/30/2022 1236   BILIDIR <0.1 (L) 04/27/2015 1420   IBILI NOT CALCULATED 04/27/2015 1420      Component Value Date/Time   TSH 4.240 10/03/2023 1004   Nutritional Lab Results  Component Value Date   VD25OH 33.5 10/03/2023   VD25OH 14.69 (L) 07/22/2023   VD25OH 11.51 (L) 11/01/2021    Attestations:   LILLETTE Sonny Laroche, acting as a Stage manager for Jennifer Jenkins, DO., have compiled all relevant documentation for today's office visit on behalf of Jennifer Jenkins, DO, while in the presence of [employer], DO.     I have reviewed the above documentation for accuracy and  completeness, and I agree with the above. Jennifer Davila, D.O.  The 21st Century Cures Act was signed into law in 2016  which includes the topic of electronic health records.  This provides immediate access to information in MyChart.  This includes consultation notes, operative notes, office notes, lab results and pathology reports.  If you have any questions about what you read please let us  know at your next visit so we can discuss your concerns and take corrective action if need be.  We are right here with you.

## 2024-04-17 LAB — HEMOGLOBIN A1C
Est. average glucose Bld gHb Est-mCnc: 114 mg/dL
Hgb A1c MFr Bld: 5.6 % (ref 4.8–5.6)

## 2024-04-17 LAB — COMPREHENSIVE METABOLIC PANEL WITH GFR
ALT: 10 IU/L (ref 0–32)
AST: 13 IU/L (ref 0–40)
Albumin: 4.3 g/dL (ref 3.9–4.9)
Alkaline Phosphatase: 100 IU/L (ref 49–135)
BUN/Creatinine Ratio: 13 (ref 12–28)
BUN: 16 mg/dL (ref 8–27)
Bilirubin Total: 0.5 mg/dL (ref 0.0–1.2)
CO2: 22 mmol/L (ref 20–29)
Calcium: 9.4 mg/dL (ref 8.7–10.3)
Chloride: 103 mmol/L (ref 96–106)
Creatinine, Ser: 1.24 mg/dL — ABNORMAL HIGH (ref 0.57–1.00)
Globulin, Total: 2.8 g/dL (ref 1.5–4.5)
Glucose: 87 mg/dL (ref 70–99)
Potassium: 4 mmol/L (ref 3.5–5.2)
Sodium: 141 mmol/L (ref 134–144)
Total Protein: 7.1 g/dL (ref 6.0–8.5)
eGFR: 47 mL/min/1.73 — ABNORMAL LOW (ref >59)

## 2024-04-17 LAB — VITAMIN D 25 HYDROXY (VIT D DEFICIENCY, FRACTURES): Vit D, 25-Hydroxy: 42.9 ng/mL (ref 30.0–100.0)

## 2024-04-18 ENCOUNTER — Ambulatory Visit
Admission: RE | Admit: 2024-04-18 | Discharge: 2024-04-18 | Disposition: A | Discharge status: Home / Self Care | Source: Ambulatory Visit | Attending: Physical Medicine and Rehabilitation

## 2024-04-18 DIAGNOSIS — R260 Ataxic gait: Secondary | ICD-10-CM

## 2024-04-18 DIAGNOSIS — M48061 Spinal stenosis, lumbar region without neurogenic claudication: Secondary | ICD-10-CM | POA: Diagnosis not present

## 2024-04-18 DIAGNOSIS — M5126 Other intervertebral disc displacement, lumbar region: Secondary | ICD-10-CM | POA: Diagnosis not present

## 2024-04-18 DIAGNOSIS — M5416 Radiculopathy, lumbar region: Secondary | ICD-10-CM

## 2024-04-18 DIAGNOSIS — M47816 Spondylosis without myelopathy or radiculopathy, lumbar region: Secondary | ICD-10-CM | POA: Diagnosis not present

## 2024-04-23 ENCOUNTER — Ambulatory Visit: Payer: Self-pay | Admitting: Physical Medicine and Rehabilitation

## 2024-04-23 DIAGNOSIS — M48062 Spinal stenosis, lumbar region with neurogenic claudication: Secondary | ICD-10-CM | POA: Insufficient documentation

## 2024-04-23 NOTE — Telephone Encounter (Signed)
 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 This likely explains the worsening neuropathy that now extends into her thighs and the unsteadiness she feels while walking. I informed I would be referring her to neurosurgery for evaluation urgently, and to call the office back if she has a preferred surgeon or any questions about these results.   Joesph JAYSON Likes, DO 04/23/2024

## 2024-04-29 ENCOUNTER — Ambulatory Visit (INDEPENDENT_AMBULATORY_CARE_PROVIDER_SITE_OTHER): Admitting: Family Medicine

## 2024-05-07 ENCOUNTER — Ambulatory Visit: Admitting: Neuroradiology

## 2024-05-07 NOTE — Progress Notes (Signed)
 Referring Physician:  Emeline Joesph BROCKS, DO [address] [place],  [place] [address]  Primary Physician:  [place], [name], MD  History of Present Illness: 05/20/2024 [name] is here today with a chief complaint of back pain and radiating pain going down the backs of her legs.  She does also have neuropathy which causes numbness and tingling in her bilateral toes.  She feels like she is off balance when she is walking.  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  She does get some claudicatory symptoms when she is standing for an excess period of time.  She also gets worsening back pain when she is in certain positions or on her feet for extended period of time.  Conservative measures:  Physical therapy:  has not participated in  Multimodal medical therapy including regular antiinflammatories:  Tylenol , Ibuprofen , Methocarbamol  Injections: no epidural steroid injections  Past Surgery: none  Arlyne LITTIE Daring has no symptoms of cervical myelopathy.  The symptoms are causing a significant impact on the patient's life.   I have utilized the care everywhere function in epic to review the outside records available from external health systems.  Review of Systems:  A 10 point review of systems is negative, except for the pertinent positives and negatives detailed in the HPI.  Past Medical History: Past Medical History:  Diagnosis Date   Arthritis    Constipation    on stool softener   GERD (gastroesophageal reflux disease)    GERD (gastroesophageal reflux disease)    High blood pressure    High cholesterol    Hyperlipidemia    Hypertension    Joint pain    Kidney stones    Pre-diabetes    Rheumatoid arthritis (HCC)    Trouble swallowing 2013   following lap band   Urticaria    Vitamin D  deficiency      Past Surgical History: Past Surgical History:  Procedure Laterality Date   ABDOMINAL HYSTERECTOMY     BREAST BIOPSY Left 11/04/2019    BENIGN LYMPH NODE WITH PARACORTICAL HYPERPLASIA   CHOLECYSTECTOMY     COLONOSCOPY N/A 01/08/2018   Procedure: COLONOSCOPY;  Surgeon: Leigh Elspeth SQUIBB, MD;  Location: [facility];  Service: Gastroenterology;  Laterality: N/A;   LAPAROSCOPIC GASTRIC BANDING     LAPAROSCOPIC REPAIR AND REMOVAL OF GASTRIC BAND  2013-2014    Allergies: Allergies as of 05/20/2024   (No Known Allergies)    Medications:  Current Outpatient Medications:    acetaminophen  (TYLENOL ) 500 MG tablet, Take 1-2 tablets (500-1,000 mg total) by mouth daily as needed for moderate pain (pain)., Disp: 60 tablet, Rfl: 1   acetaminophen -codeine  (TYLENOL  #3) 300-30 MG tablet, Take 1 tablet by mouth 2 (two) times daily as needed for moderate pain (pain score 4-6)., Disp: 15 tablet, Rfl: 0   amLODipine -olmesartan  (AZOR ) 5-40 MG tablet, Take 1 tablet by mouth daily., Disp: 90 tablet, Rfl: 2   ibuprofen  (ADVIL ) 400 MG tablet, Take 1 tablet (400 mg total) by mouth once a week., Disp: 30 tablet, Rfl: 0   methocarbamol  (ROBAXIN ) 750 MG tablet, TAKE 1 TABLET BY MOUTH EVERY 6 (SIX) HOURS AS NEEDED FOR MUSCLE SPASMS (POSTERIOR THIGH PAIN)., Disp: 90 tablet, Rfl: 2   omeprazole  (PRILOSEC) 20 MG capsule, TAKE 1 CAPSULE BY MOUTH DAILY/NEEDS APPT FOR FURTHER REFILLS, Disp: 90  capsule, Rfl: 0   polyethylene glycol (MIRALAX  / GLYCOLAX ) 17 g packet, Take 17 g by mouth every other day., Disp: , Rfl:    pravastatin  (PRAVACHOL ) 20 MG tablet, TAKE 1 TABLET BY MOUTH EVERY DAY, Disp: 90 tablet, Rfl: 3   QUTENZA , 4 PATCH, 8 %, APPLY4 PATCHES TOPICALLY TO AFFECTED AREA, ADMINISTER AT DOCTORS OFFICE EVERY 90 DAYS, Disp: 4 patch, Rfl: 0   spironolactone  (ALDACTONE ) 25 MG tablet, Take 1 tablet (25 mg total) by mouth daily., Disp: 90 tablet, Rfl: 1   traZODone  (DESYREL ) 50 MG tablet, TAKE 1/2 TO 1 TABLET (25 TO  50 MG TOTAL) BY MOUTH AT BEDTIME, Disp: 30 tablet, Rfl: 2   Vitamin D , Ergocalciferol , (DRISDOL ) 1.25 MG (50000 UNIT) CAPS capsule, Take 1 capsule (50,000 Units total) by mouth every 7 (seven) days., Disp: 4 capsule, Rfl: 0  Social History: Social History   Tobacco Use   Smoking status: Former    Current packs/day: 0.00    Types: Cigarettes    Quit date: 10/02/1972    Years since quitting: 51.6   Smokeless tobacco: Never  Vaping Use   Vaping status: Never Used  Substance Use Topics   Alcohol use: No   Drug use: No    Family Medical History: Family History  Problem Relation Age of Onset   Hypertension Mother    Stroke Mother    Hypertension Father    Diabetes Father    Alcohol abuse Father    Kidney disease Father    Colon polyps Brother    Cancer - Colon Brother    Alcohol abuse Brother    Hypertension Son    Heart disease Maternal Grandmother    Colon cancer Neg Hx    Esophageal cancer Neg Hx    Rectal cancer Neg Hx    Stomach cancer Neg Hx     Physical Examination: Vitals:   05/20/24 1043  BP: 138/86    General: Patient is in no apparent distress. Attention to examination is appropriate.  Neck:   Supple.  Full range of motion.  Respiratory: Patient is breathing without any difficulty.   NEUROLOGICAL:     Awake, alert, oriented to person, place, and time.  Speech is clear and fluent.   Cranial Nerves: Pupils equal round and reactive to light.  Facial tone is symmetric.  Facial sensation is symmetric. Shoulder shrug is symmetric. Tongue protrusion is midline.    Strength:  Side Iliopsoas Quads Hamstring PF DF EHL  R 5 5 5 5 5 5   L 5 5 5 5 5 5    She is diffusely hyporeflexic in the bilateral lower extremities.  Stocking-like loss of sensation in her lower extremities     No evidence of dysmetria noted.  Gait is wide-based  Imaging: Narrative & Impression  CLINICAL DATA:  Provided history: Ataxia gait. Acute right lumbar radiculopathy.  Myelopathy, acute, lumbar spine. New right lower extremity weakness. Gait ataxia. Additional history provided by the scanning technologist: The patient reports balance difficulty, right leg pain, low back pain, foot numbness.   EXAM: MRI LUMBAR SPINE WITHOUT CONTRAST   TECHNIQUE: Multiplanar, multisequence MR imaging of the lumbar spine was performed. No intravenous contrast was administered.   COMPARISON:  CT abdomen/pelvis 05/03/2023. Lumbar spine MRI 01/23/2018.   FINDINGS: Segmentation: 5 lumbar vertebrae. The caudal most well-formed intervertebral disc space is designated L5-S1.   Alignment:  6 mm L4-L5 grade 1 anterolisthesis, progressed.   Vertebrae: No lumbar vertebral compression fracture. Mild degenerative endplate edema  at L4-L5 and L5-S1.   Conus medullaris and cauda equina: Conus extends to the T12-L1 level. No signal abnormality identified within the visualized distal spinal cord.   Paraspinal and other soft tissues: No abnormality identified within included portions of the abdomen/retroperitoneum. No paraspinal mass or collection.   Disc levels:   Unless otherwise stated, the level by level findings below have not significantly changed from the prior MRI of 01/23/2018.   Mild-to-moderate disc degeneration at L4-L5 has slightly progressed. No more than mild disc degeneration at the remaining levels.   T11-T12: This level is imaged in the sagittal plane only. Slight disc bulge. No significant spinal canal or foraminal stenosis.   T12-L1: Slight disc bulge. No significant spinal canal or foraminal stenosis.   L1-L2: Slight disc bulge. New superimposed broad-based right foraminal disc protrusion. Mild facet hypertrophy. No significant spinal canal or foraminal stenosis.   L2-L3: Slight disc bulge, new from the prior MRI. Mild facet and ligamentum flavum hypertrophy, progressed. No significant spinal canal stenosis. Mild relative bilateral neural  foraminal narrowing.   L3-L4: Disc bulge. Moderate facet and ligamentum flavum hypertrophy, progressed. Moderate left subarticular stenosis with crowding of the descending left L4 nerve root, progressed (series 105, image 21). Mild relative narrowing of the central canal, new. Bilateral neural foraminal narrowing (mild right, mild-to-moderate left), progressed.   L4-L5: 6 mm grade 1 anterolisthesis, progressed. Partial disc uncovering with disc bulge. Advanced facet arthropathy with ligamentum flavum hypertrophy, progressed. Severe central canal stenosis with bilateral subarticular narrowing, progressed. Moderate bilateral neural foraminal narrowing.   L5-S1: Central posterior annular fissure, new from the prior MRI. Disc bulge. Facet arthropathy (advanced right, mild left). No significant spinal canal stenosis. Bilateral neural foraminal narrowing (moderate right, mild left).   IMPRESSION: 1. Lumbar spondylosis as outlined within the body of the report, and having progressed at multiple levels since the prior MRI of 01/23/2018. 2. At L4-L5, mild-to-moderate disc degeneration has progressed. Mild degenerative endplate edema. Multifactorial severe central canal stenosis with bilateral subarticular narrowing, progressed. Advanced facet arthropathy and 6 mm grade 1 anterolisthesis contribute to spinal canal and foraminal stenosis at this level. 3. At L3-L4, there is progressive multifactorial moderate left subarticular stenosis with crowding of the descending left L4 nerve root. Mild relative central canal stenosis, new. Bilateral neural foraminal narrowing (mild right, mild-to-moderate left), progressed. 4. At L5-S1, there is mild degenerative endplate edema. Multifactorial bilateral foraminal stenosis (moderate right, mild left). Facet arthropathy is advanced on the right. 5. No significant spinal canal stenosis, and no more than mild neural foraminal narrowing, at the remaining  levels.     Electronically Signed   By: Rockey Childs D.O.   On: 04/22/2024 08:43   I have personally reviewed the images and agree with the above interpretation.  Medical Decision Making/Assessment and Plan: Ms. Shaft is a pleasant 68 y.o. female with history of low back pain and radiating pain down her legs.  She also has a history of polyneuropathy.  She feels like she is continuing to get worse.  Has had a recent MRI which demonstrated significant stenosis mostly at L4-5 with central canal stenosis and lateral recess stenosis.  She has some mild claudicatory symptoms but most of her issues involve back pain and radiating pain down the backs of her legs.  On physical exam she has good strength with diffuse loss of reflexes, however she does have a history of polyneuropathy.  She has not had any physical therapy will plan to make a referral to  physical therapy for core strengthening and low back pain.  I also like her to get flexion-extension x-rays of her lumbar spine to evaluate for any spondylolisthesis as she does have mostly focal disease at L4-5 otherwise does have some diffuse more mild spondylosis noted.  Will plan to follow-up with her after she has more of her conservative management.  Thank you for involving me in the care of this patient.   Penne MICAEL Sharps MD/MSCR Neurosurgery

## 2024-05-11 ENCOUNTER — Encounter: Attending: Physical Medicine and Rehabilitation | Admitting: Physical Medicine and Rehabilitation

## 2024-05-11 DIAGNOSIS — M79604 Pain in right leg: Secondary | ICD-10-CM | POA: Insufficient documentation

## 2024-05-11 DIAGNOSIS — G609 Hereditary and idiopathic neuropathy, unspecified: Secondary | ICD-10-CM | POA: Insufficient documentation

## 2024-05-11 DIAGNOSIS — R260 Ataxic gait: Secondary | ICD-10-CM | POA: Insufficient documentation

## 2024-05-11 DIAGNOSIS — M5416 Radiculopathy, lumbar region: Secondary | ICD-10-CM | POA: Insufficient documentation

## 2024-05-11 DIAGNOSIS — M79605 Pain in left leg: Secondary | ICD-10-CM | POA: Insufficient documentation

## 2024-05-13 ENCOUNTER — Encounter (INDEPENDENT_AMBULATORY_CARE_PROVIDER_SITE_OTHER): Payer: Self-pay

## 2024-05-20 ENCOUNTER — Encounter: Payer: Self-pay | Admitting: Neurosurgery

## 2024-05-20 ENCOUNTER — Ambulatory Visit

## 2024-05-20 ENCOUNTER — Ambulatory Visit (INDEPENDENT_AMBULATORY_CARE_PROVIDER_SITE_OTHER): Admitting: Neurosurgery

## 2024-05-20 VITALS — BP 138/86 | Ht 60.0 in | Wt 237.0 lb

## 2024-05-20 DIAGNOSIS — M5416 Radiculopathy, lumbar region: Secondary | ICD-10-CM | POA: Diagnosis not present

## 2024-05-20 DIAGNOSIS — M48062 Spinal stenosis, lumbar region with neurogenic claudication: Secondary | ICD-10-CM

## 2024-05-20 NOTE — Patient Instructions (Signed)

## 2024-05-27 ENCOUNTER — Ambulatory Visit: Admitting: Family Medicine

## 2024-05-28 ENCOUNTER — Telehealth: Payer: Self-pay | Admitting: Neurosurgery

## 2024-05-28 ENCOUNTER — Other Ambulatory Visit: Payer: Self-pay | Admitting: Physician Assistant

## 2024-05-28 DIAGNOSIS — M48062 Spinal stenosis, lumbar region with neurogenic claudication: Secondary | ICD-10-CM

## 2024-05-28 NOTE — Telephone Encounter (Signed)
 Pt is wanting to know if a referral for injections will still be sent out? They would prefer it to be in [place] with [facility] if possible.

## 2024-06-03 ENCOUNTER — Ambulatory Visit: Admitting: Family Medicine

## 2024-06-03 ENCOUNTER — Encounter: Payer: Self-pay | Admitting: Family Medicine

## 2024-06-03 ENCOUNTER — Other Ambulatory Visit: Payer: Self-pay | Admitting: Physical Medicine and Rehabilitation

## 2024-06-03 VITALS — BP 138/80 | HR 68 | Temp 97.9°F | Resp 16 | Ht 60.0 in | Wt 236.6 lb

## 2024-06-03 DIAGNOSIS — Z23 Encounter for immunization: Secondary | ICD-10-CM

## 2024-06-03 DIAGNOSIS — Z0000 Encounter for general adult medical examination without abnormal findings: Secondary | ICD-10-CM | POA: Diagnosis not present

## 2024-06-03 DIAGNOSIS — I10 Essential (primary) hypertension: Secondary | ICD-10-CM

## 2024-06-03 DIAGNOSIS — G609 Hereditary and idiopathic neuropathy, unspecified: Secondary | ICD-10-CM

## 2024-06-03 DIAGNOSIS — K219 Gastro-esophageal reflux disease without esophagitis: Secondary | ICD-10-CM

## 2024-06-03 MED ORDER — AMLODIPINE-OLMESARTAN 5-40 MG PO TABS: 1.0000 | ORAL_TABLET | Freq: Every day | ORAL | 3 refills | Status: AC

## 2024-06-03 MED ORDER — SPIRONOLACTONE 25 MG PO TABS
25.0000 mg | ORAL_TABLET | Freq: Every day | ORAL | 2 refills | Status: AC
Start: 2024-06-03 — End: ?

## 2024-06-03 MED ORDER — OMEPRAZOLE 20 MG PO CPDR: DELAYED_RELEASE_CAPSULE | ORAL | 3 refills | Status: DC

## 2024-06-03 NOTE — Progress Notes (Signed)
 "   Chief Complaint  Patient presents with   Medical Management of Chronic Issues    Six month follow-up    Medicare Wellness   Discussed the use of AI scribe software for clinical note transcription with the patient, who gave verbal consent to proceed.  History of Present Illness Jennifer Davila is a 68 year old female  PMHx significant for HTN, peripheral neuropathy, GERD, chronic urticaria, osteopenia, HLD, iron  deficiency anemia, prediabetes, and anxiety/depression here today for follow-up and Medicare wellness visit. She is working as a engineer, structural of a patient with dementia, who is here with her today.  She was last seen on 02/28/2024. Chronic lower extremity pain, she follows with pain management and treated with capsaicin  gel 8% for peripheral neuropathy. She reports that she is planning on having epidural injection for lumbar radiculopathy.  She is no longer taking ibuprofen . Past history of GI bleed, she has not had any blood in the stool or melena in several months. Last visit she received a prescription for Tylenol  3, which she discontinued because she did not help with pain.  She attempts to walk 5,000 steps daily but finds it challenging due to her caregiving/job responsibilities. She has not experienced any falls in the past year and denies feelings of depression. She follows regularly with healthy weight [facility] wellness clinic.  Functional Status Survey: Is the patient deaf or have difficulty hearing?: No Does the patient have difficulty seeing, even when wearing glasses/contacts?: No Does the patient have difficulty concentrating, remembering, or making decisions?: No Does the patient have difficulty walking or climbing stairs?: No Does the patient have difficulty dressing or bathing?: No Does the patient have difficulty doing errands alone such as visiting a doctor's office or shopping?: No     06/03/2024    1:29 PM 12/18/2023    9:28 AM 09/04/2023   10:50 AM  06/06/2023   10:45 AM 03/05/2023    3:09 PM  Fall Risk   Falls in the past year? 0 0 0 0 0  Number falls in past yr: 0 0 0 0   Injury with Fall? 0 0  0   Risk for fall due to :    No Fall Risks   Follow up Education provided;Falls evaluation completed   Falls evaluation completed    Providers she sees regularly: She sees her eye care provider regularly. Neurosurgeon: Dr. Claudene. Physical medicine and rehabilitation: Dr. Emeline. Gastroenterologist: Dr. Montel Bachelor, who she sees as needed. Dr. Midge at [facility].     06/03/2024    1:29 PM  Depression screen PHQ 2/9  Decreased Interest 0  Down, Depressed, Hopeless 0  PHQ - 2 Score 0     Mini-Cog - 06/03/24 1315     Normal clock drawing test? yes    How many words correct? 3         Vision Screening   Right eye Left eye Both eyes  Without correction 20 40 20 30 20  40  With correction      Hypertension: Currently she is on amlodipine -olmesartan  5-40 mg daily and spironolactone  25 mg daily. She is not monitoring BP regularly. Negative for unusual or severe headache, visual changes, exertional chest pain, dyspnea,  focal weakness, or worsening edema.  Lab Results  Component Value Date   CREATININE 1.24 (H) 04/16/2024   BUN 16 04/16/2024   NA 141 04/16/2024   K 4.0 04/16/2024   CL 103 04/16/2024   CO2  22 04/16/2024   Hyperlipidemia: She is currently on pravastatin  20 mg daily. Lab Results  Component Value Date   CHOL 173 10/03/2023   HDL 46 10/03/2023   LDLCALC 109 (H) 10/03/2023   TRIG 98 10/03/2023   CHOLHDL 3.8 10/03/2023   GERD: Currently on omeprazole  20 mg daily, she needs refills today.  Review of Systems  Constitutional:  Negative for activity change, appetite change and fever.  HENT:  Negative for sore throat.   Respiratory:  Negative for cough and wheezing.   Gastrointestinal:  Negative for abdominal pain, nausea and vomiting.  Endocrine: Negative for cold intolerance and  heat intolerance.  Genitourinary:  Negative for decreased urine volume, dysuria and hematuria.  Musculoskeletal:  Positive for back pain.  Skin:  Negative for rash.  Neurological:  Negative for syncope and facial asymmetry.  Psychiatric/Behavioral:  Negative for confusion and hallucinations.   See other pertinent positives and negatives in HPI.  Current Outpatient Medications on File Prior to Visit  Medication Sig Dispense Refill   acetaminophen  (TYLENOL ) 500 MG tablet Take 1-2 tablets (500-1,000 mg total) by mouth daily as needed for moderate pain (pain). 60 tablet 1   methocarbamol  (ROBAXIN ) 750 MG tablet TAKE 1 TABLET BY MOUTH EVERY 6 (SIX) HOURS AS NEEDED FOR MUSCLE SPASMS (POSTERIOR THIGH PAIN). 90 tablet 2   polyethylene glycol (MIRALAX  / GLYCOLAX ) 17 g packet Take 17 g by mouth every other day.     pravastatin  (PRAVACHOL ) 20 MG tablet TAKE 1 TABLET BY MOUTH EVERY DAY 90 tablet 3   QUTENZA , 4 PATCH, 8 % APPLY4 PATCHES TOPICALLY TO AFFECTED AREA, ADMINISTER AT DOCTORS OFFICE EVERY 90 DAYS 4 patch 0   Vitamin D , Ergocalciferol , (DRISDOL ) 1.25 MG (50000 UNIT) CAPS capsule Take 1 capsule (50,000 Units total) by mouth every 7 (seven) days. 4 capsule 0   No current facility-administered medications on file prior to visit.    Past Medical History:  Diagnosis Date   Arthritis    Constipation    on stool softener   GERD (gastroesophageal reflux disease)    GERD (gastroesophageal reflux disease)    High blood pressure    High cholesterol    Hyperlipidemia    Hypertension    Joint pain    Kidney stones    Pre-diabetes    Rheumatoid arthritis (HCC)    Trouble swallowing 2013   following lap band   Urticaria    Vitamin D  deficiency     No Known Allergies  Social History   Socioeconomic History   Marital status: Married    Spouse name: Not on file   Number of children: 1   Years of education: Not on file   Highest education level: Not on file  Occupational History    Occupation: caregiver  Tobacco Use   Smoking status: Former    Current packs/day: 0.00    Types: Cigarettes    Quit date: 10/02/1972    Years since quitting: 51.7   Smokeless tobacco: Never  Vaping Use   Vaping status: Never Used  Substance and Sexual Activity   Alcohol use: No   Drug use: No   Sexual activity: Never  Other Topics Concern   Not on file  Social History Narrative   Not on file   Social Drivers of Health   Financial Resource Strain: Low Risk  (02/22/2023)   Overall Financial Resource Strain (CARDIA)    Difficulty of Paying Living Expenses: Not hard at all  Food Insecurity: No Food Insecurity (  02/22/2023)   Hunger Vital Sign    Worried About Running Out of Food in the Last Year: Never true    Ran Out of Food in the Last Year: Never true  Transportation Needs: No Transportation Needs (02/22/2023)   PRAPARE - Administrator, Civil Service (Medical): No    Lack of Transportation (Non-Medical): No  Physical Activity: Inactive (02/22/2023)   Exercise Vital Sign    Days of Exercise per Week: 0 days    Minutes of Exercise per Session: 0 min  Stress: No Stress Concern Present (02/22/2023)   [employer] of Occupational Health - Occupational Stress Questionnaire    Feeling of Stress : Not at all  Social Connections: Moderately Integrated (02/22/2023)   Social Connection and Isolation Panel    Frequency of Communication with Friends and Family: More than three times a week    Frequency of Social Gatherings with Friends and Family: More than three times a week    Attends Religious Services: More than 4 times per year    Active Member of [employer] or Organizations: Yes    Attends Banker Meetings: More than 4 times per year    Marital Status: Widowed   Today's Vitals   06/03/24 1259  BP: 138/80  Pulse: 68  Resp: 16  Temp: 97.9 F (36.6 C)  SpO2: 98%  Weight: 236 lb 9.6 oz (107.3 kg)  Height: 5' (1.524 m)   Body mass index is 46.21  kg/m.  Physical Exam Vitals and nursing note reviewed.  Constitutional:      General: She is not in acute distress.    Appearance: She is well-developed.  HENT:     Head: Normocephalic and atraumatic.     Mouth/Throat:     Mouth: Mucous membranes are moist.     Pharynx: Oropharynx is clear.  Eyes:     Conjunctiva/sclera: Conjunctivae normal.  Cardiovascular:     Rate and Rhythm: Normal rate and regular rhythm.     Pulses:          Dorsalis pedis pulses are 2+ on the right side and 2+ on the left side.     Heart sounds: No murmur heard.    Comments: Non-pitting bilateral LE edema Pulmonary:     Effort: Pulmonary effort is normal. No respiratory distress.     Breath sounds: Normal breath sounds.  Abdominal:     Palpations: Abdomen is soft. There is no mass.     Tenderness: There is no right CVA tenderness or left CVA tenderness.  Lymphadenopathy:     Cervical: No cervical adenopathy.  Skin:    General: Skin is warm.     Findings: No erythema or rash.  Neurological:     General: No focal deficit present.     Mental Status: She is alert and oriented to person, place, and time.     Gait: Gait normal.  Psychiatric:        Mood and Affect: Mood and affect normal.   ASSESSMENT AND PLAN:  Ms. Abbee Cremeens was seen today for medical management of chronic issues and Medicare wellness.  Diagnoses and all orders for this visit: Orders Placed This Encounter  Procedures   Flu vaccine HIGH DOSE PF(Fluzone Trivalent)   Medicare annual wellness visit, subsequent We discussed the importance of staying active, physically and mentally, as well as the benefits of a healthy/balance diet. Low impact exercise that involve stretching and strengthing are ideal. Vaccines: She needs Shingrix, recommend  getting it at her pharmacy.  We discussed preventive screening for the next 5-10 years, summery of recommendations given in AVS. Fall prevention. Advance directives and end of  life discussed, recommend having POA and living will, package given today/   Hypertension, essential, benign Assessment & Plan: BP overall adequately controlled. Continue amlodipine -olmesartan  5-40 mg daily and spironolactone  25 mg daily as well as low-salt diet. Recommend monitoring BP regularly. Eye exam is current. She follows with healthy weight and wellness clinic a few times per year, so as far as problem is stable, we can follow in 6 to 12 months.  Orders: -     amLODIPine -Olmesartan ; Take 1 tablet by mouth daily.  Dispense: 90 tablet; Refill: 3 -     Spironolactone ; Take 1 tablet (25 mg total) by mouth daily.  Dispense: 90 tablet; Refill: 2  Gastroesophageal reflux disease, unspecified whether esophagitis present Assessment & Plan: Problem is well-controlled with current treatment. Continue omeprazole  20 mg before breakfast and GERD precautions. Follow-up in a year, before if needed.  Orders: -     Omeprazole ; TAKE 1 CAPSULE BY MOUTH DAILY/NEEDS APPT FOR FURTHER REFILLS  Dispense: 90 capsule; Refill: 3  Need for immunization against influenza -     Flu vaccine HIGH DOSE PF(Fluzone Trivalent)  Return in about 6 months (around 12/01/2024) for 6-12 months., chronic problems.  Drelyn Pistilli, MD [facility]. [place] office.   "

## 2024-06-03 NOTE — Assessment & Plan Note (Signed)
 BP overall adequately controlled. Continue amlodipine -olmesartan  5-40 mg daily and spironolactone  25 mg daily as well as low-salt diet. Recommend monitoring BP regularly. Eye exam is current. She follows with healthy weight and wellness clinic a few times per year, so as far as problem is stable, we can follow in 6 to 12 months.

## 2024-06-03 NOTE — Assessment & Plan Note (Signed)
 Problem is well-controlled with current treatment. Continue omeprazole  20 mg before breakfast and GERD precautions. Follow-up in a year, before if needed.

## 2024-06-03 NOTE — Patient Instructions (Addendum)
 Ms. Stoffers,  Thank you for taking the time for your Medicare Wellness Visit. I appreciate your continued commitment to your health goals. Please review the care plan we discussed, and feel free to reach out if I can assist you further.  Please note that Annual Wellness Visits do not include a physical exam. Some assessments may be limited, especially if the visit was conducted virtually. If needed, we may recommend an in-person follow-up with your provider.  Ongoing Care I will see you in 6-12 months.If you see weight clinic I can see you annually but you need to check blood pressure at home. No changes today.  Referrals If a referral was made during today's visit and you haven't received any updates within two weeks, please contact the referred provider directly to check on the status.  Recommended Screenings:  Health Maintenance  Topic Date Due   Zoster (Shingles) Vaccine (1 of 2) Never done   COVID-19 Vaccine (3 - Moderna risk series) 03/04/2020   Medicare Annual Wellness Visit  02/22/2024   Flu Shot  02/28/2024   Breast Cancer Screening  06/18/2024   DTaP/Tdap/Td vaccine (2 - Td or Tdap) 05/22/2025   Colon Cancer Screening  11/05/2029   Pneumococcal Vaccine for age over 90  Completed   DEXA scan (bone density measurement)  Completed   Hepatitis C Screening  Completed   Meningitis B Vaccine  Aged Out   Hepatitis B Vaccine  Discontinued       08/17/2023   12:31 PM  Advanced Directives  Does Patient Have a Medical Advance Directive? No  Would patient like information on creating a medical advance directive? No - Patient declined   Vision: Annual vision screenings are recommended for early detection of glaucoma, cataracts, and diabetic retinopathy. These exams can also reveal signs of chronic conditions such as diabetes and high blood pressure.  Dental: Annual dental screenings help detect early signs of oral cancer, gum disease, and other conditions linked to overall health,  including heart disease and diabetes.  Please see the attached documents for additional preventive care recommendations.   A few things to remember from today's visit:  Need for immunization against influenza - Plan: Flu vaccine HIGH DOSE PF(Fluzone Trivalent)  Medicare annual wellness visit, subsequent  If you need refills for medications you take chronically, please call your pharmacy. Do not use My Chart to request refills or for acute issues that need immediate attention. If you send a my chart message, it may take a few days to be addressed, specially if I am not in the office.  Please be sure medication list is accurate. If a new problem present, please set up appointment sooner than planned today.

## 2024-06-15 ENCOUNTER — Telehealth: Payer: Self-pay | Admitting: Physical Medicine and Rehabilitation

## 2024-06-15 NOTE — Telephone Encounter (Signed)
 Patient had been scheduled incorrectly for back injection.  She needed to see Dr. Carilyn for ESI injection.  I have left patient a voicemail to call back.  I have rescheduled her to December 2 to arrive at 10:45 for 11:00.  Did ask patient to call back, pre-procedure sheet needs to be done, did also ask if she was on blood thinners.

## 2024-06-18 ENCOUNTER — Other Ambulatory Visit: Payer: Self-pay | Admitting: Family Medicine

## 2024-06-18 ENCOUNTER — Telehealth: Payer: Self-pay | Admitting: Family Medicine

## 2024-06-18 DIAGNOSIS — K219 Gastro-esophageal reflux disease without esophagitis: Secondary | ICD-10-CM

## 2024-06-18 MED ORDER — OMEPRAZOLE 20 MG PO CPDR: DELAYED_RELEASE_CAPSULE | ORAL | 3 refills | Status: AC

## 2024-06-18 NOTE — Telephone Encounter (Signed)
 Copied from CRM [id number]. Topic: Clinical - Medication Refill >> Jun 18, 2024  8:55 AM Antwanette L wrote: Medication: omeprazole  (PRILOSEC) 20 MG capsule   Has the patient contacted their pharmacy? Yes. They only had an order for her amLODipine -olmesartan  (AZOR ) 5-40 MG tablet  This is the patient's preferred pharmacy:  [id number] [address], [place] - [address] [place] [address] Phone: [phone] Fax: [phone]  Is this the correct pharmacy for this prescription? Yes   Has the prescription been filled recently? Yes. Last refill was on 06/03/24.  Is the patient out of the medication? No. The patient only has 2 pills left  Has the patient been seen for an appointment in the last year OR does the patient have an upcoming appointment? Yes. Last ov with Dr. [name] was on 06/03/24   Can we respond through MyChart? No. Contact the patient by phone at [phone]  Agent: Please be advised that Rx refills may take up to 3 business days. We ask that you follow-up with your pharmacy. >> Jun 18, 2024  9:02 AM Antwanette L wrote: [facility]. The pharmacy only had an order for amlodipine -olmesartan  (Azor ) 5-40 mg tablet. Patient reported that there was no order on file for omeprazole  (Prilosec) 20 mg capsule. According to the patient's chart, the omeprazole  order was placed on 11/5.Please resend the [facility]epraz[facility]  pres[facility] at [address]

## 2024-06-18 NOTE — Telephone Encounter (Unsigned)
 Copied from CRM [id number]. Topic: Clinical - Medication Refill >> Jun 18, 2024  8:55 AM Antwanette L wrote: Medication: omeprazole  (PRILOSEC) 20 MG capsule   Has the patient contacted their pharmacy? Yes. They only had an order for her amLODipine -olmesartan  (AZOR ) 5-40 MG tablet  This is the patient's preferred pharmacy:  [id number] [address], [place] - [address] [place] [address] Phone: [phone] Fax: [phone]  Is this the correct pharmacy for this prescription? Yes   Has the prescription been filled recently? Yes. Last refill was on 06/03/24.  Is the patient out of the medication? No. The patient only has 2 pills left  Has the patient been seen for an appointment in the last year OR does the patient have an upcoming appointment? Yes. Last ov with Dr. [name] was on 06/03/24   Can we respond through MyChart? No. Contact the patient by phone at [phone]  Agent: Please be advised that Rx refills may take up to 3 business days. We ask that you follow-up with your pharmacy.

## 2024-06-18 NOTE — Telephone Encounter (Signed)
 Duplicate

## 2024-06-29 ENCOUNTER — Encounter: Attending: Physical Medicine and Rehabilitation | Admitting: Physical Medicine and Rehabilitation

## 2024-06-29 VITALS — BP 135/81 | HR 75 | Ht 60.0 in | Wt 230.0 lb

## 2024-06-29 DIAGNOSIS — M79604 Pain in right leg: Secondary | ICD-10-CM | POA: Insufficient documentation

## 2024-06-29 DIAGNOSIS — G609 Hereditary and idiopathic neuropathy, unspecified: Secondary | ICD-10-CM | POA: Diagnosis not present

## 2024-06-29 DIAGNOSIS — M5416 Radiculopathy, lumbar region: Secondary | ICD-10-CM | POA: Insufficient documentation

## 2024-06-29 DIAGNOSIS — M79605 Pain in left leg: Secondary | ICD-10-CM | POA: Diagnosis present

## 2024-06-29 MED ORDER — CAPSAICIN-CLEANSING GEL 8 % EX KIT
4.0000 | PACK | Freq: Once | CUTANEOUS | Status: AC
  Administered 2024-06-29: 4 via TOPICAL

## 2024-06-29 NOTE — Progress Notes (Signed)
 HPI: Jennifer Davila is a 68 y.o. female with PMHx has Hip pain; Hypertension, essential, benign; Hyperlipidemia; Morbid obesity (HCC); Vertigo; Leg cramping; Vitamin D  deficiency, unspecified; GERD (gastroesophageal reflux disease); Leg pain; Bilateral lower extremity edema; Lower GI bleed; Hypokalemia; Constipation; Diverticulosis of colon with hemorrhage; Hematochezia; Iron  deficiency anemia; Chronic urticaria; Seasonal and perennial allergic rhinitis; Positive ANA (antinuclear antibody); Bilateral leg pain; Prediabetes; GI bleed; COVID-19 virus infection; Nephrolithiasis; Gait difficulty; Osteopenia; Hereditary and idiopathic peripheral neuropathy; Skin pruritus; Dieting; Adjustment disorder with mixed anxiety and depressed mood; and Adjustment insomnia on their problem list. who presents to clinic for treatment of pain related to peripheral neuropathy with qutenza  patches as described below.      Interval Hx:    Scheduled for ESI with Dr Carilyn tomorrow; has not done paperwork and has questions about restrictions. Peripheral neuropathy at nighttime getting worse. No new changes. 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    Physical Exam:  General: Appropriate appearance for age.  Mental Status: Appropriate mood and affect.  Cardiovascular: RRR, no m/r/g. Peripheral edema 1+ - stable Respiratory: CTAB, no rales/rhonchi/wheezing.  Skin: No apparent rashes or lesions.  Neuro: Awake, alert, and oriented x3.  Sensory loss in stocking pattern in bilateral lower extremities.    MSK:   + R hip flexor 4/5; otherwise BL LE 5/5   PROCEDURE:  Qutenza  Patch application Diagnosis:      ICD-10-CM    1. Hereditary and idiopathic peripheral neuropathy  G60.9 capsaicin  topical system 8 % patch 4 patch     2. Bilateral leg pain  M79.604      M79.605         Goals with treatment: [ x ] Decrease pain [  ] Improve Active / Passive ROM [ x ] Improve  ADLs [ x ] Improve functional mobility   MEDICATION:  [ x ] Qutenza  8% topical capsaicin  - 4 patches     CONSENT: Obtained in writing per policy. Consent uploaded to chart.   Benefits discussed.  Risks discussed included, but were not limited to, pain and discomfort, bleeding, bruising, allergic reaction, infection. All questions answered to patient/family member/guardian/ caregiver satisfaction. They would like to proceed with procedure. There are no noted contraindications to procedure.   PROCEDURE Time out was preformed No heat sources No antibiotics   The patient was explained about both the benefits and risks of a capsaicin  8% patch application. After the patient acknowledged an understanding of the risks and benefits, the patient agreed to proceed. 2 patches of Qutenza  were applied to the area of pain in each mid-shin, 1/2 patch on anterior foot, and 1/2 [patch on calf.  Blood pressure was monitored every 15 minutes. The patient tolerated the procedure well for 30 minutes until patches were removed and post-patch cream was applies.   No complications were encountered. The patient tolerated the procedure well.   Impression: HPI: Jennifer Davila is a 68 y.o. female with PMHx has Hip pain; Hypertension, essential, benign; Hyperlipidemia; Morbid obesity (HCC); Vertigo; Leg cramping; Vitamin D  deficiency, unspecified; GERD (gastroesophageal reflux disease); Leg pain; Bilateral lower extremity edema; Lower GI bleed; Hypokalemia; Constipation; Diverticulosis of colon with hemorrhage; Hematochezia; Iron  deficiency anemia; Chronic urticaria; Seasonal and perennial allergic rhinitis; Positive ANA (antinuclear antibody); Bilateral leg pain; Prediabetes; GI bleed; COVID-19 virus infection; Nephrolithiasis; Gait difficulty; Osteopenia; Hereditary and idiopathic peripheral neuropathy; Skin pruritus; Dieting; Adjustment disorder with mixed anxiety and depressed mood;  and Adjustment insomnia on their  problem list. who presents to clinic for treatment of pain related to peripheral neuropathy with qutenza  patches as described below; also today with new symptoms of RLE ataxia and weakness concerned for spinal stenosis.    PLAN:   Hereditary and idiopathic peripheral neuropathy Bilateral leg pain - Call 49M after epidural to report results - Resume Usual Activities. Notify Physician of any unusual bleeding, erythema or concern for side effects as reviewed above. - Apply ice prn for pain - Tylenol  prn for pain   Follow up every 3 months for Qutenza  as beneficial; can stretch treatments to every 6 months or every year depending on length of benefit   Avoid hot or cold water with showers/baths for the next 3 days; keep temperatures lukewarm. Wash treated areas with soap and water when first able today. You may have ongoing burning over treatment areas for a few days after your visit. Call if this becomes severe.

## 2024-06-29 NOTE — Patient Instructions (Addendum)
-   Call 1 Month after epidural to report results - Resume Usual Activities. Notify Physician of any unusual bleeding, erythema or concern for side effects as reviewed above. - Apply ice prn for pain - Tylenol  prn for pain   Follow up every 3 months for Qutenza  as beneficial; can stretch treatments to every 6 months or every year depending on length of benefit  Avoid hot or cold water with showers/baths for the next 3 days; keep temperatures lukewarm. Wash treated areas with soap and water when first able today. You may have ongoing burning over treatment areas for a few days after your visit. Call if this becomes severe.

## 2024-06-30 ENCOUNTER — Encounter: Payer: Self-pay | Admitting: Physical Medicine & Rehabilitation

## 2024-06-30 ENCOUNTER — Encounter: Admitting: Physical Medicine & Rehabilitation

## 2024-06-30 VITALS — BP 151/84 | HR 90 | Temp 98.3°F | Ht 60.0 in | Wt 230.0 lb

## 2024-06-30 DIAGNOSIS — M5416 Radiculopathy, lumbar region: Secondary | ICD-10-CM

## 2024-06-30 DIAGNOSIS — G609 Hereditary and idiopathic neuropathy, unspecified: Secondary | ICD-10-CM | POA: Diagnosis not present

## 2024-06-30 MED ORDER — LIDOCAINE HCL 1 % IJ SOLN
5.0000 mL | Freq: Once | INTRAMUSCULAR | Status: AC
  Administered 2024-06-30: 5 mL

## 2024-06-30 MED ORDER — IOHEXOL 180 MG/ML  SOLN
3.0000 mL | Freq: Once | INTRAMUSCULAR | Status: AC
  Administered 2024-06-30: 3 mL

## 2024-06-30 MED ORDER — DEXAMETHASONE SOD PHOSPHATE PF 10 MG/ML IJ SOLN
10.0000 mg | Freq: Once | INTRAMUSCULAR | Status: AC
  Administered 2024-06-30: 10 mg

## 2024-06-30 MED ORDER — LIDOCAINE HCL (PF) 1 % IJ SOLN
2.0000 mL | Freq: Once | INTRAMUSCULAR | Status: AC
  Administered 2024-06-30: 2 mL

## 2024-06-30 NOTE — Progress Notes (Signed)
 RIght L4-5 Lumbar transforaminal epidural steroid injection under fluoroscopic guidance with contrast enhancement  Indication: Lumbosacral radiculitis is not relieved by medication management or other conservative care and interfering with self-care and mobility.  No allergies, no anticoagulant use Reviewed MRI findings  Informed consent was obtained after describing risk and benefits of the procedure with the patient, this includes bleeding, bruising, infection, paralysis and medication side effects.  The patient wishes to proceed and has given written consent.  Patient was placed in prone position.  The lumbar area was marked and prepped with Betadine.  It was entered with a 25-gauge 1-1/2 inch needle and one mL of 1% lidocaine  was injected into the skin and subcutaneous tissue.  Then a 22-gauge 7 spinal needle was inserted into the Right L4-5  intervertebral foramen under AP, lateral, and oblique view.  Once needle tip was within the foramen on lateral views and or exceeding 6 o clock position on the pedicle on AP view, Omnipaque 180 was injected x 2ml Then a solution containing one mL of 10 mg per mL dexamethasone and 2 mL of 1% lidocaine  was injected.  The patient tolerated procedure well.  Post procedure instructions were given.  Please see post procedure form.

## 2024-06-30 NOTE — Patient Instructions (Signed)
You received a Right L4-5  epidural steroid injection under fluoroscopic guidance. This is the most accurate way to perform an epidural injection. This injection was performed to relieve thigh or leg or foot pain that may be related to a pinched nerve in the lumbar spine. The local anesthetic injected today may cause numbness in your leg for a couple hours. If it is severe we may need to observe you for 30-60 minutes after the injection. The cortisone medicine injected today may take several days to take full effect. This medicine can also cause facial flushing or feeling of being warm.  This injection may last for days weeks or months. It can be repeated if needed. If it is not effective, another spinal level may need to be injected. Other treatments include medication management as well as physical therapy. In some cases surgery may be an option.

## 2024-06-30 NOTE — Progress Notes (Signed)
  PROCEDURE RECORD [facility]   Name: MIYANNA WIERSMA DOB:05/24/1956 MRN: [id number]  Date:06/30/2024  Physician:     Nurse/CMA: Bari Ada  Allergies: No Known Allergies  Consent Signed: Yes.    Is patient diabetic? No.  CBG today? N/A  Pregnant: No. LMP: No LMP recorded. Patient has had a hysterectomy. (age [age])  Anticoagulants: no Anti-inflammatory: no Antibiotics: no  Procedure: Right L4-5 Epidural Steroid Injection Position: Supine Start Time: 11:19 am  End Time: 11:32 am  Fluoro Time: 1:18  RN/CMA Bari Ada     Time 11:01 11:46    BP 159/83 156/88    Pulse 90 78    Respirations 14 12    O2 Sat 96 97    S/S 6 6    Pain Level 9 2     D/C home with brother in-law, patient A & O X 3, D/C instructions reviewed, and sits independently.

## 2024-07-08 ENCOUNTER — Encounter: Admitting: Physical Medicine and Rehabilitation

## 2024-07-28 ENCOUNTER — Telehealth: Payer: Self-pay | Admitting: Neurosurgery

## 2024-07-28 DIAGNOSIS — M48062 Spinal stenosis, lumbar region with neurogenic claudication: Secondary | ICD-10-CM

## 2024-07-28 NOTE — Telephone Encounter (Signed)
 Referral updated/placed

## 2024-07-28 NOTE — Telephone Encounter (Signed)
 Patient is calling to request a new referral be placed for her to continue with physical therapy at [facility].

## 2024-07-29 NOTE — Telephone Encounter (Signed)
"  Referral has been sent.  "

## 2024-08-04 ENCOUNTER — Encounter (INDEPENDENT_AMBULATORY_CARE_PROVIDER_SITE_OTHER): Payer: Self-pay | Admitting: Family Medicine

## 2024-08-04 ENCOUNTER — Ambulatory Visit (INDEPENDENT_AMBULATORY_CARE_PROVIDER_SITE_OTHER): Admitting: Family Medicine

## 2024-08-04 VITALS — BP 138/81 | HR 74 | Temp 98.5°F | Ht 60.0 in | Wt 227.0 lb

## 2024-08-04 DIAGNOSIS — E559 Vitamin D deficiency, unspecified: Secondary | ICD-10-CM

## 2024-08-04 DIAGNOSIS — E66813 Obesity, class 3: Secondary | ICD-10-CM | POA: Diagnosis not present

## 2024-08-04 DIAGNOSIS — Z6841 Body Mass Index (BMI) 40.0 and over, adult: Secondary | ICD-10-CM | POA: Diagnosis not present

## 2024-08-04 DIAGNOSIS — R7303 Prediabetes: Secondary | ICD-10-CM | POA: Diagnosis not present

## 2024-08-04 DIAGNOSIS — I10 Essential (primary) hypertension: Secondary | ICD-10-CM | POA: Diagnosis not present

## 2024-08-04 MED ORDER — VITAMIN D (ERGOCALCIFEROL) 1.25 MG (50000 UNIT) PO CAPS: 50000.0000 [IU] | ORAL_CAPSULE | ORAL | 0 refills | Status: AC

## 2024-08-04 NOTE — Progress Notes (Signed)
 "  Jennifer Davila, D.O.  ABFM, ABOM Specializing in Clinical Bariatric Medicine  Office located at: [address] [place], [place]  [address]      A) FOR THE CHRONIC DISEASE OF OBESITY:  Chief complaint: Obesity Jennifer Davila is here to discuss her progress with her obesity treatment plan.   History of present illness / Interval history:  Jennifer Davila is here today for her follow-up office visit.  Since last OV on 04/16/24, pt is down 8 lbs. 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 She has been doing a lot more walking.     04/16/24 08/04/24   Body Fat % 53.3 % 52.4 %  Muscle Mass (lbs) 104.2 lbs 103 lbs  Fat Mass (lbs) 125.2 lbs 119.4 lbs  Total Body Water (lbs) 84.8 lbs 81.4 lbs  Visceral Fat Rating  20 19   Counseling done on how various foods will affect these numbers and how to maximize success.  Total lbs lost to date: - 21 lbs Total Fat Mass in lbs lost to date: - 10.4 lbs Total weight loss percentage to date: - 8.47 %    Obesity, starting 48.43 BMI 45.0-49.9, adult (HCC) current BMI 44.33  Nutrition Therapy She is on the Category 1 Plan and states she is following her eating plan approximately 0 % of the time.   - Tracking Calories/Macros: no   - Eating More Whole Foods: no  - Adequate Protein Intake: no   - Adequate Water Intake: no   - Skipping Meals: yes  - Sleeping 7-9 Hours/ Night: yes   Jennifer Davila is currently in the action stage of change. As such, her goal is to continue weight management plan.  She has agreed to: continue current plan   Physical Activity Jennifer Davila is walking 60  minutes 4 days per week   Jennifer Davila has been advised to work up to 300-450 minutes of moderate intensity aerobic activity a week and strengthening exercises 2-3 times per week for cardiovascular health, weight loss maintenance and preservation of muscle mass.  She has agreed to : Increase volume of physical  activity to a goal of 240 minutes a week and Combine aerobic and strengthening exercises for efficiency and improved cardiometabolic health.   Behavioral Modifications Evidence-based interventions for health behavior change were utilized today including the discussion of   1) SMART goals for next OV:    - increase exercise  Regarding patient's less desirable eating habits and patterns, we employed the technique of small changes.   We discussed the following today: increasing lean protein intake to established goals, continue to work on implementation of reduced calorie nutritional plan, continue to practice mindfulness when eating, and continue to work on maintaining a reduced calorie state, getting the recommended amount of protein, incorporating whole foods, making healthy choices, staying well hydrated and practicing mindfulness when eating. Additional resources provided today: Handout on CAT 1 meal plan , Handout on CAT 1-2 breakfast options, Handout on CAT 1-2 lunch options, and Handout on Common Characteristics of Successful Weight Losers and Maintainers    Medical Interventions/ Pharmacotherapy Previous Bariatric surgery: n/a Pharmacotherapy for weight loss: She is not currently taking medications  for medical weight loss.    We discussed various medication options to help Jennifer Davila with her weight loss efforts and we both agreed to : Continue with current nutritional and behavioral strategies   B) OBESITY RELATED CONDITIONS ADDRESSED TODAY:  Prediabetes Assessment &  Plan Lab Results  Component Value Date   HGBA1C 5.6 04/16/2024   HGBA1C 6.1 (H) 10/03/2023   HGBA1C 6.3 07/22/2023   INSULIN  18.2 10/03/2023    Lab Results  Component Value Date   NA 141 04/16/2024   CL 103 04/16/2024   K 4.0 04/16/2024   CO2 22 04/16/2024   BUN 16 04/16/2024   CREATININE 1.24 (H) 04/16/2024   EGFR 47 (L) 04/16/2024   CALCIUM  9.4 04/16/2024   ALBUMIN 4.3 04/16/2024   GLUCOSE 87  04/16/2024      Component Value Date/Time   PROT 7.1 04/16/2024 1031   ALBUMIN 4.3 04/16/2024 1031   AST 13 04/16/2024 1031   ALT 10 04/16/2024 1031   ALKPHOS 100 04/16/2024 1031   BILITOT 0.5 04/16/2024 1031   BILIDIR <0.1 (L) 04/27/2015 1420   IBILI NOT CALCULATED 04/27/2015 1420   Diet and life style controlled. Patient A1C has decreased from 6.1 to 5.6. This was done due to patient increasing her exercise and decreasing her simple carbs and sugars. Patients creatinine levels have slightly increased this can be due to poorly controled BP. Goal creatinine levels are less than 1.2. Increase water intake and gain better control of BP to try to lower creatinine levels. Since patients kidney functions did have an increase in creatinine and her A1C did decrease to 5.6 we Jennifer Davila not recommended that she begin Metformin at this moment. Continue following prudent meal plan, decreasing simple carbs and increasing water intake.     Vitamin D  deficiency, unspecified Assessment & Plan Lab Results  Component Value Date   VD25OH 42.9 04/16/2024   VD25OH 33.5 10/03/2023   VD25OH 14.69 (L) 07/22/2023   On ERGO 50K units once weekly. Vit D levels have increased to almost goal levels. Patient states that she has not been taking her Vit D supplements since about mid October since she has been out of refill. Will refill ERGO today. Restart supplementation. Reminded patient the importance of having at goal Vit D levels.     Hypertension, essential, benign Assessment & Plan BP Readings from Last 3 Encounters:  08/04/24 138/81  06/30/24 (!) 151/84  06/29/24 135/81   The 10-year ASCVD risk score (Arnett DK, et al., 2019) is: 11.9%  Lab Results  Component Value Date   CREATININE 1.24 (H) 04/16/2024   On Azor  1 tablet daily and Aldactone  25 mg daily with reported good compliance and tolerance. Patient states that her BP at home has not been running high. Reminded her of the importance of at goal BP to  avoid added stress onto kidneys. Recommended that patient check her BP at home 2 to 3 times a week. Continue with medications.    Medications Discontinued During This Encounter  Medication Reason   Vitamin D , Ergocalciferol , (DRISDOL ) 1.25 MG (50000 UNIT) CAPS capsule Reorder     Meds ordered this encounter  Medications   Vitamin D , Ergocalciferol , (DRISDOL ) 1.25 MG (50000 UNIT) CAPS capsule    Sig: Take 1 capsule (50,000 Units total) by mouth every 7 (seven) days.    Dispense:  12 capsule    Refill:  0     Follow up:   Return 09/29/2024 at 10:00 AM  She was informed of the importance of frequent follow up visits to maximize her success with intensive lifestyle modifications for her multiple health conditions.   Weight Summary and Biometrics   Weight Lost Since Last Visit: 8lb  Weight Gained Since Last Visit: 0lb  Vitals Temp: 98.5 F (  36.9 C) BP: 138/81 Pulse Rate: 74 SpO2: 99 %   Anthropometric Measurements Height: 5' (1.524 m) Weight: 227 lb (103 kg) BMI (Calculated): 44.33 Weight at Last Visit: 235lb Weight Lost Since Last Visit: 8lb Weight Gained Since Last Visit: 0lb Starting Weight: 248lb Total Weight Loss (lbs): 21 lb (9.526 kg)   Body Composition  Body Fat %: 52.4 % Fat Mass (lbs): 119.4 lbs Muscle Mass (lbs): 103 lbs Total Body Water (lbs): 81.4 lbs Visceral Fat Rating : 19   Other Clinical Data Fasting: no Labs: no Today's Visit #: 10 Starting Date: 10/03/23    Objective:   PHYSICAL EXAM: Blood pressure 138/81, pulse 74, temperature 98.5 F (36.9 C), height 5' (1.524 m), weight 227 lb (103 kg), SpO2 99%. Body mass index is 44.33 kg/m.  General: she is overweight, cooperative and in no acute distress. PSYCH: Has normal mood, affect and thought process.   HEENT: EOMI, sclerae are anicteric. Lungs: Normal breathing effort, no conversational dyspnea. Extremities: Moves * 4 Neurologic: A and O * 3, good insight  DIAGNOSTIC DATA  REVIEWED: BMET    Component Value Date/Time   NA 141 04/16/2024 1031   K 4.0 04/16/2024 1031   CL 103 04/16/2024 1031   CO2 22 04/16/2024 1031   GLUCOSE 87 04/16/2024 1031   GLUCOSE 112 (H) 08/29/2023 0943   BUN 16 04/16/2024 1031   CREATININE 1.24 (H) 04/16/2024 1031   CREATININE 1.03 (H) 05/03/2016 1152   CALCIUM  9.4 04/16/2024 1031   GFRNONAA >60 08/17/2023 1243   GFRNONAA 58 (L) 05/03/2016 1152   GFRAA 68 04/19/2020 1051   GFRAA 67 05/03/2016 1152   Lab Results  Component Value Date   HGBA1C 5.6 04/16/2024   HGBA1C 5.7 (H) 05/03/2016   Lab Results  Component Value Date   INSULIN  18.2 10/03/2023   Lab Results  Component Value Date   TSH 4.240 10/03/2023   CBC    Component Value Date/Time   WBC 4.7 08/17/2023 1243   RBC 5.55 (H) 08/17/2023 1243   HGB 12.7 08/17/2023 1243   HGB 12.1 05/30/2022 1235   HCT 41.4 08/17/2023 1243   HCT 39.2 05/30/2022 1235   PLT 268 08/17/2023 1243   PLT 312 05/30/2022 1235   MCV 74.6 (L) 08/17/2023 1243   MCV 75 (L) 05/30/2022 1235   MCH 22.9 (L) 08/17/2023 1243   MCHC 30.7 08/17/2023 1243   RDW 14.3 08/17/2023 1243   RDW 14.8 05/30/2022 1235   Iron  Studies    Component Value Date/Time   IRON  36 05/30/2022 1236   TIBC 369 05/30/2022 1236   FERRITIN 85 05/30/2022 1236   IRONPCTSAT 10 (L) 05/30/2022 1236   Lipid Panel     Component Value Date/Time   CHOL 173 10/03/2023 1004   TRIG 98 10/03/2023 1004   HDL 46 10/03/2023 1004   CHOLHDL 3.8 10/03/2023 1004   CHOLHDL 4 11/01/2021 1040   VLDL 31.0 11/01/2021 1040   LDLCALC 109 (H) 10/03/2023 1004   Hepatic Function Panel     Component Value Date/Time   PROT 7.1 04/16/2024 1031   ALBUMIN 4.3 04/16/2024 1031   AST 13 04/16/2024 1031   ALT 10 04/16/2024 1031   ALKPHOS 100 04/16/2024 1031   BILITOT 0.5 04/16/2024 1031   BILIDIR <0.1 (L) 04/27/2015 1420   IBILI NOT CALCULATED 04/27/2015 1420      Component Value Date/Time   TSH 4.240 10/03/2023 1004    Nutritional Lab Results  Component Value Date  VD25OH 42.9 04/16/2024   VD25OH 33.5 10/03/2023   VD25OH 14.69 (L) 07/22/2023    Attestations:   Jennifer Davila, acting as a medical scribe for Jennifer Jenkins, Jennifer Davila., have compiled all relevant documentation for today's office visit on behalf of Jennifer Jenkins, Jennifer Davila, while in the presence of [employer], Jennifer Davila.  I have reviewed the above documentation for accuracy and completeness, and I agree with the above. Jennifer Davila, D.O.  The 21st Century Cures Act was signed into law in 2016 which includes the topic of electronic health records.  This provides immediate access to information in MyChart.  This includes consultation notes, operative notes, office notes, lab results and pathology reports.  If you have any questions about what you read please let us  know at your next visit so we can discuss your concerns and take corrective action if need be.  We are right here with you.  "

## 2024-08-10 ENCOUNTER — Other Ambulatory Visit: Payer: Self-pay | Admitting: Family Medicine

## 2024-08-10 DIAGNOSIS — Z1231 Encounter for screening mammogram for malignant neoplasm of breast: Secondary | ICD-10-CM

## 2024-08-13 ENCOUNTER — Inpatient Hospital Stay: Admission: RE | Admit: 2024-08-13 | Discharge: 2024-08-13 | Attending: Family Medicine | Admitting: Family Medicine

## 2024-08-13 DIAGNOSIS — Z1231 Encounter for screening mammogram for malignant neoplasm of breast: Secondary | ICD-10-CM

## 2024-08-14 ENCOUNTER — Telehealth: Payer: Self-pay

## 2024-08-14 DIAGNOSIS — I10 Essential (primary) hypertension: Secondary | ICD-10-CM

## 2024-08-14 NOTE — Telephone Encounter (Signed)
 Someone entered in error

## 2024-08-14 NOTE — Telephone Encounter (Signed)
Called patient. Patient does not have voicemail set up.

## 2024-08-14 NOTE — Telephone Encounter (Signed)
 How high is her BP? Options are: Increasing Amlodipine  from 5 mg to 10 mg, may aggravate LE edema OR add a new agent, Carvedilol starting 6.25 mg bid. Thanks, BJ

## 2024-08-14 NOTE — Telephone Encounter (Signed)
 Copied from CRM [id number]. Topic: Clinical - Medical Advice >> Aug 14, 2024 11:14 AM Laymon HERO wrote: Reason for CRM: Wanting to start Physical Therapy- unable to start since BP has been high- wanting to know if Dr [name] can go up in dosage of BP medication

## 2024-08-17 MED ORDER — CARVEDILOL 6.25 MG PO TABS: 6.2500 mg | ORAL_TABLET | Freq: Two times a day (BID) | ORAL | 3 refills | Status: AC

## 2024-08-17 NOTE — Addendum Note (Signed)
 Addended by: VANICE LUCIENNE PARAS on: 08/17/2024 08:29 AM   Modules accepted: Orders

## 2024-08-17 NOTE — Telephone Encounter (Signed)
 BP readings that the patient was able to remember.   159/100 Saturday  173/98 Thursday

## 2024-08-17 NOTE — Telephone Encounter (Signed)
 Patient agree to taking Carvedilol  starting 6.25 mg bid.

## 2024-08-24 ENCOUNTER — Ambulatory Visit: Admitting: Neurosurgery

## 2024-08-26 ENCOUNTER — Telehealth: Payer: Self-pay | Admitting: Neurosurgery

## 2024-08-26 NOTE — Telephone Encounter (Signed)
 From: [name] Sent: 08/26/2024   9:04 AM EST To: [facility]   Patient reports that they have attempted to attend physical therapy but have only completed one successful session due to elevated blood pressure preventing participation in aquatic therapy. Are there alternative PT options that can be considered or adjusted for PT?

## 2024-08-26 NOTE — Telephone Encounter (Signed)
 error

## 2024-08-26 NOTE — Telephone Encounter (Signed)
 Called [facility] in [place] to get more information. Spoke with [facility] therapist, they check patient's b/p if a patient has a diagnoses of Hypertension and at their discretion. The day patient came to them last time per [facility] patient did not take her medication as prescribed it seemed and her bottom number reading was 100 and so it was contraindication to do water therapy and regular exercises. Patient is still welcome to call back and make an appointment to try again. I called patient and explained this information. Advised patient to make sure she is taking her medications as prescribed and to call back for another appointment.  Advised to let us  know when she is discharged so we can make a follow up appointment for her.

## 2024-08-31 ENCOUNTER — Ambulatory Visit: Admitting: Neurosurgery

## 2024-08-31 ENCOUNTER — Telehealth: Payer: Self-pay

## 2024-08-31 NOTE — Telephone Encounter (Signed)
 [employer]  denied. Denial letter will be in your box

## 2024-08-31 NOTE — Telephone Encounter (Signed)
 Waiting on patient to give permission to do an appeal

## 2024-09-29 ENCOUNTER — Ambulatory Visit (INDEPENDENT_AMBULATORY_CARE_PROVIDER_SITE_OTHER): Admitting: Family Medicine

## 2024-09-30 ENCOUNTER — Encounter: Admitting: Physical Medicine and Rehabilitation
